# Patient Record
Sex: Male | Born: 1985
Health system: Southern US, Community
[De-identification: ages and names within clinical notes are randomized; demographics above are authoritative.]

## PROBLEM LIST (undated history)

## (undated) DIAGNOSIS — G540 Brachial plexus disorders: Secondary | ICD-10-CM

## (undated) DIAGNOSIS — E785 Hyperlipidemia, unspecified: Secondary | ICD-10-CM

## (undated) DIAGNOSIS — F329 Major depressive disorder, single episode, unspecified: Secondary | ICD-10-CM

## (undated) DIAGNOSIS — J3089 Other allergic rhinitis: Secondary | ICD-10-CM

## (undated) DIAGNOSIS — F32A Depression, unspecified: Secondary | ICD-10-CM

## (undated) DIAGNOSIS — I10 Essential (primary) hypertension: Secondary | ICD-10-CM

## (undated) DIAGNOSIS — H8109 Meniere's disease, unspecified ear: Secondary | ICD-10-CM

## (undated) DIAGNOSIS — J45909 Unspecified asthma, uncomplicated: Secondary | ICD-10-CM

## (undated) DIAGNOSIS — B019 Varicella without complication: Secondary | ICD-10-CM

## (undated) HISTORY — PX: OTHER SURGICAL HISTORY: SHX169

## (undated) HISTORY — DX: Varicella without complication: B01.9

## (undated) HISTORY — DX: Major depressive disorder, single episode, unspecified: F32.9

## (undated) HISTORY — DX: Other allergic rhinitis: J30.89

## (undated) HISTORY — DX: Brachial plexus disorders: G54.0

## (undated) HISTORY — DX: Essential (primary) hypertension: I10

## (undated) HISTORY — DX: Depression, unspecified: F32.A

## (undated) HISTORY — DX: Hyperlipidemia, unspecified: E78.5

---

## 2011-02-26 ENCOUNTER — Encounter (INDEPENDENT_AMBULATORY_CARE_PROVIDER_SITE_OTHER): Payer: Self-pay | Admitting: General Surgery

## 2011-02-26 NOTE — Telephone Encounter (Signed)
This encounter was created in error - please disregard.

## 2012-05-12 ENCOUNTER — Emergency Department (HOSPITAL_COMMUNITY)
Admission: EM | Admit: 2012-05-12 | Discharge: 2012-05-12 | Disposition: A | Discharge status: Home / Self Care | Payer: 59 | Attending: Emergency Medicine | Admitting: Emergency Medicine

## 2012-05-12 ENCOUNTER — Other Ambulatory Visit: Payer: Self-pay | Admitting: *Deleted

## 2012-05-12 ENCOUNTER — Encounter (HOSPITAL_COMMUNITY): Payer: Self-pay | Admitting: *Deleted

## 2012-05-12 DIAGNOSIS — R002 Palpitations: Secondary | ICD-10-CM

## 2012-05-12 DIAGNOSIS — Z79899 Other long term (current) drug therapy: Secondary | ICD-10-CM | POA: Insufficient documentation

## 2012-05-12 DIAGNOSIS — J45909 Unspecified asthma, uncomplicated: Secondary | ICD-10-CM | POA: Insufficient documentation

## 2012-05-12 DIAGNOSIS — R0789 Other chest pain: Secondary | ICD-10-CM | POA: Insufficient documentation

## 2012-05-12 HISTORY — DX: Unspecified asthma, uncomplicated: J45.909

## 2012-05-12 LAB — POCT I-STAT, CHEM 8
Calcium, Ion: 1.23 mmol/L (ref 1.12–1.23)
Creatinine, Ser: 1 mg/dL (ref 0.50–1.35)
Glucose, Bld: 107 mg/dL — ABNORMAL HIGH (ref 70–99)
Hemoglobin: 15.3 g/dL (ref 13.0–17.0)
TCO2: 29 mmol/L (ref 0–100)

## 2012-05-12 LAB — POCT I-STAT TROPONIN I: Troponin i, poc: 0.02 ng/mL (ref 0.00–0.08)

## 2012-05-12 NOTE — ED Provider Notes (Addendum)
History     CSN: [id number]  Arrival date & time 05/12/12  4098   First MD Initiated Contact with Patient 05/12/12 1000      Chief Complaint  Patient presents with  . Palpitations    (Consider location/radiation/quality/duration/timing/severity/associated sxs/prior treatment) HPI Presents with palpitations and feeling of skipped heart beat onset 4:30 AM today. Symptoms accompanied by vague left anterior chest discomfort. Lasted 1.5 hours, resolve spontaneously without treatment No shortness of breath no nausea no sweatiness. He had a similar episode 1.5 years ago when using Provigil. He has since stopped Provigil. He also gets similar episodes lasting few seconds when he uses his albuterol inhaler. He is presently asymptomatic without treatment. I shortness of breath nausea or sweatiness Past Medical History  Diagnosis Date  . Asthma     History reviewed. No pertinent past surgical history.  No family history on file.  History  Substance Use Topics  . Smoking status: Never Smoker   . Smokeless tobacco: Not on file  . Alcohol Use: Yes     Comment: social drinker   No drug use   Review of Systems  Constitutional: Negative.   HENT: Negative.   Respiratory: Negative.   Cardiovascular: Positive for chest pain and palpitations.  Gastrointestinal: Negative.   Musculoskeletal: Negative.   Skin: Negative.   Neurological: Negative.   Psychiatric/Behavioral: Negative.   All other systems reviewed and are negative.    Allergies  Review of patient's allergies indicates no known allergies.  Home Medications   Current Outpatient Rx  Name  Route  Sig  Dispense  Refill  . albuterol (PROVENTIL HFA;VENTOLIN HFA) 108 (90 BASE) MCG/ACT inhaler   Inhalation   Inhale 2 puffs into the lungs every 6 (six) hours as needed for wheezing.         . diazepam (VALIUM) 5 MG tablet   Oral   Take 5 mg by mouth every 6 (six) hours as needed (menieres disease).         Marland Kitchen  diphenhydrAMINE (BENADRYL) 25 MG tablet   Oral   Take 25 mg by mouth every 6 (six) hours as needed for itching or allergies.         Marland Kitchen ondansetron (ZOFRAN-ODT) 8 MG disintegrating tablet   Oral   Take 8 mg by mouth every 8 (eight) hours as needed for nausea.           BP 133/92  Pulse 63  Temp(Src) 97.8 F (36.6 C) (Oral)  Resp 11  SpO2 98%  Physical Exam  Nursing note and vitals reviewed. Constitutional: He appears well-developed and well-nourished.  HENT:  Head: Normocephalic and atraumatic.  Eyes: Conjunctivae are normal. Pupils are equal, round, and reactive to light.  Neck: Neck supple. No tracheal deviation present. No thyromegaly present.  Cardiovascular: Normal rate and regular rhythm.   No murmur heard. Pulmonary/Chest: Effort normal and breath sounds normal.  Abdominal: Soft. Bowel sounds are normal. He exhibits no distension. There is no tenderness.  Musculoskeletal: Normal range of motion. He exhibits no edema and no tenderness.  Neurological: He is alert. Coordination normal.  Skin: Skin is warm and dry. No rash noted.  Psychiatric: He has a normal mood and affect.    ED Course  Procedures (including critical care time)  Labs Reviewed - No data to display No results found.   No diagnosis found.   Date: 05/12/2012  Rate: 90  Rhythm: normal sinus rhythm  QRS Axis: left  Intervals: normal  ST/T Wave abnormalities:  nonspecific T wave changes  Conduction Disutrbances:left anterior fascicular block and nonspecific intraventricular conduction delay  Narrative Interpretation:   Old EKG Reviewed: none available  1:40 PM patient remains asymptomatic. No abnormality a cardiac monitor while here MDM    Results for orders placed during the hospital encounter of 05/12/12  POCT I-STAT, CHEM 8      Result Value Range   Sodium 140  135 - 145 mEq/L   Potassium 4.0  3.5 - 5.1 mEq/L   Chloride 103  96 - 112 mEq/L   BUN 13  6 - 23 mg/dL   Creatinine, Ser  1.19  0.50 - 1.35 mg/dL   Glucose, Bld 147 (*) 70 - 99 mg/dL   Calcium, Ion 8.29  5.62 - 1.23 mmol/L   TCO2 29  0 - 100 mmol/L   Hemoglobin 15.3  13.0 - 17.0 g/dL   HCT 13.0  86.5 - 78.4 %  POCT I-STAT TROPONIN I      Result Value Range   Troponin i, poc 0.02  0.00 - 0.08 ng/mL   Comment 3           POCT I-STAT TROPONIN I      Result Value Range   Troponin i, poc 0.00  0.00 - 0.08 ng/mL   Comment 3            No results found.   Spoke with Dr. Daleen Squibb If ED w/u neg , will arrange for oputpt echo and event monitor Diagnosis #1 palpitations #2 atypical chest pain  Doug Sou, MD 05/12/12 1349  Doug Sou, MD 05/29/12 1450

## 2012-05-12 NOTE — ED Notes (Addendum)
Pt c/o asthma exacerbation X 5 days. Pt states for the past 36 hrs he "feels runs of PVC" experiences sob with episodes. These episodes have worsened the last 12 hours. C/o of dull midsternal and left sided chest pain X 2 hrs. Denies n/v/d.

## 2012-05-12 NOTE — ED Notes (Signed)
Family at bedside. 

## 2012-05-15 ENCOUNTER — Emergency Department (HOSPITAL_COMMUNITY)
Admission: EM | Admit: 2012-05-15 | Discharge: 2012-05-15 | Disposition: A | Discharge status: Home / Self Care | Payer: 59 | Attending: Emergency Medicine | Admitting: Emergency Medicine

## 2012-05-15 ENCOUNTER — Other Ambulatory Visit (HOSPITAL_COMMUNITY): Payer: 59

## 2012-05-15 ENCOUNTER — Emergency Department (HOSPITAL_COMMUNITY): Payer: 59

## 2012-05-15 ENCOUNTER — Encounter (HOSPITAL_COMMUNITY): Payer: Self-pay | Admitting: Radiology

## 2012-05-15 ENCOUNTER — Telehealth: Payer: Self-pay | Admitting: Pulmonary Disease

## 2012-05-15 DIAGNOSIS — R0602 Shortness of breath: Secondary | ICD-10-CM | POA: Insufficient documentation

## 2012-05-15 DIAGNOSIS — R079 Chest pain, unspecified: Secondary | ICD-10-CM | POA: Insufficient documentation

## 2012-05-15 DIAGNOSIS — R002 Palpitations: Secondary | ICD-10-CM | POA: Insufficient documentation

## 2012-05-15 DIAGNOSIS — J45909 Unspecified asthma, uncomplicated: Secondary | ICD-10-CM | POA: Insufficient documentation

## 2012-05-15 DIAGNOSIS — Z79899 Other long term (current) drug therapy: Secondary | ICD-10-CM | POA: Insufficient documentation

## 2012-05-15 LAB — CBC WITH DIFFERENTIAL/PLATELET
Basophils Absolute: 0.1 10*3/uL (ref 0.0–0.1)
Eosinophils Relative: 6 % — ABNORMAL HIGH (ref 0–5)
Lymphocytes Relative: 33 % (ref 12–46)
MCV: 78.3 fL (ref 78.0–100.0)
Neutro Abs: 3.3 10*3/uL (ref 1.7–7.7)
Platelets: 196 10*3/uL (ref 150–400)
RDW: 12.8 % (ref 11.5–15.5)
WBC: 6.4 10*3/uL (ref 4.0–10.5)

## 2012-05-15 LAB — POCT I-STAT TROPONIN I

## 2012-05-15 LAB — COMPREHENSIVE METABOLIC PANEL
ALT: 36 U/L (ref 0–53)
AST: 23 U/L (ref 0–37)
Albumin: 4.6 g/dL (ref 3.5–5.2)
CO2: 29 mEq/L (ref 19–32)
Calcium: 10.1 mg/dL (ref 8.4–10.5)
GFR calc non Af Amer: >90 mL/min (ref >90)
Sodium: 141 mEq/L (ref 135–145)
Total Protein: 7.9 g/dL (ref 6.0–8.3)

## 2012-05-15 MED ORDER — PREDNISONE 20 MG PO TABS: 40.0000 mg | ORAL_TABLET | Freq: Every day | ORAL | Status: DC

## 2012-05-15 MED ORDER — IOHEXOL 350 MG/ML SOLN
100.0000 mL | Freq: Once | INTRAVENOUS | Status: AC | PRN
  Administered 2012-05-15: 100 mL via INTRAVENOUS

## 2012-05-15 MED ORDER — PREDNISONE 20 MG PO TABS
60.0000 mg | ORAL_TABLET | Freq: Once | ORAL | Status: AC
  Administered 2012-05-15: 60 mg via ORAL
  Filled 2012-05-15: qty 3

## 2012-05-15 NOTE — ED Provider Notes (Addendum)
History     CSN: [id number]  Arrival date & time 05/15/12  1617   First MD Initiated Contact with Patient 05/15/12 1629      Chief Complaint  Patient presents with  . Chest Pain  . Shortness of Breath    (Consider location/radiation/quality/duration/timing/severity/associated sxs/prior treatment) HPI Comments: Was evaluated by the ED on tues with normal EKG and neg troponin however today became much worse with worsening SOB and came here for further eval.  Patient is a 27 y.o. male presenting with chest pain and shortness of breath. The history is provided by the patient.  Chest Pain Pain location:  L chest Pain quality: aching, pressure and tightness   Pain radiates to:  L jaw Pain radiates to the back: no   Pain severity:  Moderate Onset quality:  Gradual Duration:  5 days Timing:  Constant Progression:  Worsening Chronicity:  New Context comment:  Started gradually not related to anything on monday night. Relieved by:  Nothing Worsened by:  Exertion, movement and certain positions (worse with lying on the left side and exertion) Associated symptoms: palpitations and shortness of breath   Associated symptoms: no abdominal pain, no back pain, no cough, no diaphoresis, no fever, no heartburn, no nausea and not vomiting   Risk factors: high cholesterol and male sex   Risk factors: no coronary artery disease, no diabetes mellitus, no hypertension, no prior DVT/PE, no smoking and no surgery   Risk factors comment:  Hx of asthma Shortness of Breath Associated symptoms: chest pain   Associated symptoms: no abdominal pain, no cough, no diaphoresis, no fever and no vomiting     Past Medical History  Diagnosis Date  . Asthma     No past surgical history on file.  No family history on file.  History  Substance Use Topics  . Smoking status: Never Smoker   . Smokeless tobacco: Not on file  . Alcohol Use: Yes     Comment: social drinker      Review of Systems   Constitutional: Negative for fever and diaphoresis.  Respiratory: Positive for shortness of breath. Negative for cough.   Cardiovascular: Positive for chest pain and palpitations.  Gastrointestinal: Negative for heartburn, nausea, vomiting and abdominal pain.  Musculoskeletal: Negative for back pain.  All other systems reviewed and are negative.    Allergies  Review of patient's allergies indicates no known allergies.  Home Medications   Current Outpatient Rx  Name  Route  Sig  Dispense  Refill  . albuterol (PROVENTIL HFA;VENTOLIN HFA) 108 (90 BASE) MCG/ACT inhaler   Inhalation   Inhale 2 puffs into the lungs every 6 (six) hours as needed for wheezing.         . diazepam (VALIUM) 5 MG tablet   Oral   Take 5 mg by mouth every 6 (six) hours as needed (menieres disease).         Marland Kitchen diphenhydrAMINE (BENADRYL) 25 MG tablet   Oral   Take 25 mg by mouth every 6 (six) hours as needed for itching or allergies.         Marland Kitchen ondansetron (ZOFRAN-ODT) 8 MG disintegrating tablet   Oral   Take 8 mg by mouth every 8 (eight) hours as needed for nausea.           There were no vitals taken for this visit.  Physical Exam  Nursing note and vitals reviewed. Constitutional: He is oriented to person, place, and time. He appears well-developed and well-nourished.  He appears distressed.  HENT:  Head: Normocephalic and atraumatic.  Mouth/Throat: Oropharynx is clear and moist.  Eyes: Conjunctivae and EOM are normal. Pupils are equal, round, and reactive to light.  Neck: Normal range of motion. Neck supple.  Cardiovascular: Regular rhythm and intact distal pulses.  Tachycardia present.   No murmur heard. Pulmonary/Chest: Effort normal and breath sounds normal. No respiratory distress. He has no wheezes. He has no rales.  Abdominal: Soft. He exhibits no distension. There is no tenderness. There is no rebound and no guarding.  Musculoskeletal: Normal range of motion. He exhibits no edema and  no tenderness.  Neurological: He is alert and oriented to person, place, and time.  Skin: Skin is warm and dry. No rash noted. No erythema.  Clammy to the touch but no overt diaphorsis  Psychiatric: He has a normal mood and affect. His behavior is normal.    ED Course  Procedures (including critical care time)  Labs Reviewed  CBC WITH DIFFERENTIAL - Abnormal; Notable for the following:    Eosinophils Relative 6 (*)    All other components within normal limits  COMPREHENSIVE METABOLIC PANEL - Abnormal; Notable for the following:    Glucose, Bld 105 (*)    All other components within normal limits  D-DIMER, QUANTITATIVE  POCT I-STAT TROPONIN I   Dg Chest 2 View  05/15/2012  *RADIOLOGY REPORT*  Clinical Data: 27 year old male shortness of breath and chest pain.  CHEST - 2 VIEW  Comparison: None.  Findings: Multiple EKG leads and wires overlie the chest.  Normal lung volumes. Normal cardiac size and mediastinal contours. Visualized tracheal air column is within normal limits.  No pneumothorax, pulmonary edema, pleural effusion or confluent pulmonary opacity.  No osseous abnormality identified.  IMPRESSION: Negative, no acute cardiopulmonary abnormality.   Original Report Authenticated By: Erskine Speed, M.D.    Ct Angio Chest Pe W/cm &/or Wo Cm  05/15/2012  *RADIOLOGY REPORT*  Clinical Data: Chest pain, short of breath, tachycardia  CT ANGIOGRAPHY CHEST  Technique:  Multidetector CT imaging of the chest using the standard protocol during bolus administration of intravenous contrast. Multiplanar reconstructed images including MIPs were obtained and reviewed to evaluate the vascular anatomy.  Contrast: OMNIPAQUE IOHEXOL 350 MG/ML SOLN  Comparison: Prior chest x-ray earlier today 05/15/2012 at 1703 hours  Findings:  Mediastinum: Unremarkable CT appearance of the thyroid gland.  No suspicious mediastinal or hilar adenopathy.  No soft tissue mediastinal mass.  The thoracic esophagus is unremarkable.   Heart/Vascular: Adequate opacification of the pulmonary arteries to the proximal segmental level.  More distal evaluation is limited secondary to a combination of respiratory motion and contrast bolus timing.  No central filling defect to suggest acute pulmonary embolus. There is a bovine configuration of the aortic arch (two vessel arch with common origin of the brachiocephalic and left common carotid arteries), a normal anatomic variant.  No aortic dilatation.  The heart is within normal limits for size.  No pericardial effusion. Collateral veins in the left shoulder and chest suggests at least some compression of the left subclavian vein, perhaps secondary to subclinical thoracic outlet compression.  Lungs/Pleura:  Respiratory motion slightly limits evaluation for Milich pulmonary nodules.  The lungs are clear.  Of note, the most inferior aspect of the lower lobes is incompletely imaged.  Upper Abdomen: Visualized upper abdomen is unremarkable.  Bones: No acute fracture or aggressive appearing lytic or blastic osseous lesion. Dextroconvex scoliosis of the upper thoracic spine may be partly  positional in nature.  IMPRESSION:  1.  Negative for pulmonary embolism to the proximal segmental level, pneumonia or other acute cardiopulmonary process.  2. Collateral veins in the left upper arm and chest suggests underlying subclinical thoracic outlet compression of the subclavian vein.   Original Report Authenticated By: Malachy Moan, M.D.      Date: 05/15/2012  Rate: 93  Rhythm: normal sinus rhythm  QRS Axis: normal  Intervals: normal  ST/T Wave abnormalities: nonspecific ST/T changes  Conduction Disutrbances:Incomplete right bundle-branch block  Narrative Interpretation:   Old EKG Reviewed: unchanged    1. Chest pain   2. Shortness of breath       MDM   The patient now with 5 days of chest tightness, shortness of breath and palpitations. He was seen on Tuesday and had a normal EKG and troponins  and was discharged home however he states since that time symptoms have worsened. Is worse with exertion and lying on the left side. He does have a history of asthma but no other medical problems. He does not smoke and he has only used his inhaler 3 times daily. Low suspicion for albuterol adverse effects from overuse. Patient denies any fever, cough, abdominal pain or vomiting. However he does appear uncomfortable and is tachycardic in the 110-120 range. He's been persistently hypertensive which is new. The patient.  Concern for possible PE versus pericarditis versus pericardial effusion in. Low suspicion for cardiac disease as this patient is 27 years old without risk factors except for hyperlipidemia and would be very unlikely.  Patient denies a history of anxiety or panic attacks. He denies using any stimulants other than albuterol.  CBC, CMP, troponin, d-dimer, chest x-ray, EKG pending  7:31 PM Labs CT of the chest and EKG are all within normal limits. Feel that this must be exacerbation of his underlying asthma which he was in the PICU for when he was a child. Patient has an appointment to see pulmonology at the end of May. We'll try a course of prednisone to see if that improves his symptoms.      Gwyneth Sprout, MD 05/15/12 1932  Gwyneth Sprout, MD 05/15/12 1610

## 2012-05-15 NOTE — ED Notes (Signed)
Pt seen here Tuesday for same. Reports ongoing left sided CP that radiates to left neck. Pain increased when ambulating. Increased SOB.

## 2012-05-15 NOTE — Telephone Encounter (Signed)
lmomtcb x1 for pt 

## 2012-05-18 NOTE — Telephone Encounter (Addendum)
I have LMTCBx2 with the pt. Dr. Shelle Iron did you speak to Brett Canales Minor about this patient? Can we schedule him with you for a new asthma consult? He has been seen in the ED twice over last week but I do not see where they recommended pulmonary appt so this would technically be a self-referrral? Please advise. Carron Curie, CMA  If appt is ok I have held slot on wed at 9:30. Remove hold if not ok to schedule appt with KC. Carron Curie, CMA

## 2012-05-18 NOTE — Telephone Encounter (Signed)
lmtcb x3 for pt--looks like he went to ED today If appt is ok I have held slot on wed at 9:30

## 2012-05-18 NOTE — Telephone Encounter (Signed)
Ok for him to see me.  I did agree to see him as a self referral.  Lets see if we can get him in fairly soon.   Thanks.

## 2012-05-19 NOTE — Telephone Encounter (Signed)
Spoke with patient, he states he is having some diff with his phone and will call us back. Patient needs to be seen by Dr. Shelle Iron for self referral consult. [facility] has held a slot on Wed for patient at 930am

## 2012-05-20 NOTE — Telephone Encounter (Signed)
LMTCB

## 2012-05-21 NOTE — Telephone Encounter (Signed)
The earliest openings on [facility]'s schedule for a consult are June 4 Dr Shelle Iron is this early enough to offer the patient, or would you like him worked in sooner?  If so, please advise of a work-in date/time. Thanks.

## 2012-05-21 NOTE — Telephone Encounter (Signed)
LMTCBx2. Nino Amano, CMA  

## 2012-05-21 NOTE — Telephone Encounter (Signed)
I would like to see him earlier With all the cancellations I seem to be having, should be able to find a spot. See ashtyn.

## 2012-05-22 NOTE — Telephone Encounter (Signed)
I LMTCBx3 and advised if the pt still needs an appt to please call office. Per protocol I will close message. Carron Curie, CMA

## 2012-06-01 ENCOUNTER — Encounter: Payer: Self-pay | Admitting: *Deleted

## 2012-06-01 ENCOUNTER — Encounter: Payer: Self-pay | Admitting: Cardiology

## 2012-06-01 DIAGNOSIS — J45909 Unspecified asthma, uncomplicated: Secondary | ICD-10-CM | POA: Insufficient documentation

## 2012-06-01 DIAGNOSIS — R0602 Shortness of breath: Secondary | ICD-10-CM | POA: Insufficient documentation

## 2012-06-01 DIAGNOSIS — R079 Chest pain, unspecified: Secondary | ICD-10-CM | POA: Insufficient documentation

## 2012-06-02 ENCOUNTER — Encounter: Payer: 59 | Admitting: Cardiology

## 2012-06-02 ENCOUNTER — Encounter: Payer: Self-pay | Admitting: Cardiology

## 2012-06-02 NOTE — Progress Notes (Signed)
  HPI: 27 year-old male for evaluation of chest pain and dyspnea. Seen recently in the emergency room twice for these symptoms. Chest x-ray on May 2 negative. Chest CT may the second showed no pulmonary embolus. There were collateral veins in the left upper arm and chest suggestive of thoracic outlet compression of the subclavian vein. Cardiac enzymes negative. Hemoglobin 15.3. Renal function and liver functions normal. D-dimer negative.  Current Outpatient Prescriptions  Medication Sig Dispense Refill  . albuterol (PROVENTIL HFA;VENTOLIN HFA) 108 (90 BASE) MCG/ACT inhaler Inhale 2 puffs into the lungs every 6 (six) hours as needed for wheezing.      . diphenhydrAMINE (BENADRYL) 25 MG tablet Take 25 mg by mouth every 6 (six) hours as needed for itching or allergies.      . predniSONE (DELTASONE) 20 MG tablet Take 2 tablets (40 mg total) by mouth daily.  10 tablet  0   No current facility-administered medications for this visit.    No Known Allergies  Past Medical History  Diagnosis Date  . Asthma     No past surgical history on file.  History   Social History  . Marital Status: Single    Spouse Name: N/A    Number of Children: N/A  . Years of Education: N/A   Occupational History  . Not on file.   Social History Main Topics  . Smoking status: Never Smoker   . Smokeless tobacco: Not on file  . Alcohol Use: Yes     Comment: social drinker  . Drug Use: No  . Sexually Active: Not on file   Other Topics Concern  . Not on file   Social History Narrative  . No narrative on file    No family history on file.  ROS: no fevers or chills, productive cough, hemoptysis, dysphasia, odynophagia, melena, hematochezia, dysuria, hematuria, rash, seizure activity, orthopnea, PND, pedal edema, claudication. Remaining systems are negative.  Physical Exam:   There were no vitals taken for this visit.  General:  Well developed/well nourished in NAD Skin warm/dry Patient not  depressed No peripheral clubbing Back-normal HEENT-normal/normal eyelids Neck supple/normal carotid upstroke bilaterally; no bruits; no JVD; no thyromegaly chest - CTA/ normal expansion CV - RRR/normal S1 and S2; no murmurs, rubs or gallops;  PMI nondisplaced Abdomen -NT/ND, no HSM, no mass, + bowel sounds, no bruit 2+ femoral pulses, no bruits Ext-no edema, chords, 2+ DP Neuro-grossly nonfocal  ECG 05/17/2012-sinus rhythm, RV conduction delay, left ventricular hypertrophy, poor R wave progression.   This encounter was created in error - please disregard.

## 2013-02-17 ENCOUNTER — Emergency Department (HOSPITAL_COMMUNITY)
Admission: EM | Admit: 2013-02-17 | Discharge: 2013-02-18 | Disposition: A | Discharge status: Home / Self Care | Payer: 59 | Attending: Emergency Medicine | Admitting: Emergency Medicine

## 2013-02-17 ENCOUNTER — Emergency Department (HOSPITAL_COMMUNITY): Payer: 59

## 2013-02-17 ENCOUNTER — Encounter (HOSPITAL_COMMUNITY): Payer: Self-pay | Admitting: Emergency Medicine

## 2013-02-17 DIAGNOSIS — R519 Headache, unspecified: Secondary | ICD-10-CM

## 2013-02-17 DIAGNOSIS — Z79899 Other long term (current) drug therapy: Secondary | ICD-10-CM | POA: Insufficient documentation

## 2013-02-17 DIAGNOSIS — Z8669 Personal history of other diseases of the nervous system and sense organs: Secondary | ICD-10-CM | POA: Insufficient documentation

## 2013-02-17 DIAGNOSIS — J45909 Unspecified asthma, uncomplicated: Secondary | ICD-10-CM | POA: Insufficient documentation

## 2013-02-17 DIAGNOSIS — R51 Headache: Secondary | ICD-10-CM | POA: Insufficient documentation

## 2013-02-17 HISTORY — DX: Meniere's disease, unspecified ear: H81.09

## 2013-02-17 LAB — BASIC METABOLIC PANEL
BUN: 16 mg/dL (ref 6–23)
CALCIUM: 9.6 mg/dL (ref 8.4–10.5)
CO2: 26 mEq/L (ref 19–32)
Chloride: 103 mEq/L (ref 96–112)
Creatinine, Ser: 1.18 mg/dL (ref 0.50–1.35)
GFR calc Af Amer: >90 mL/min (ref >90)
GFR, EST NON AFRICAN AMERICAN: 83 mL/min — AB (ref >90)
GLUCOSE: 94 mg/dL (ref 70–99)
Potassium: 3.9 mEq/L (ref 3.7–5.3)
SODIUM: 143 meq/L (ref 137–147)

## 2013-02-17 LAB — CBC
HCT: 42.3 % (ref 39.0–52.0)
Hemoglobin: 15.1 g/dL (ref 13.0–17.0)
MCH: 29.3 pg (ref 26.0–34.0)
MCHC: 35.7 g/dL (ref 30.0–36.0)
MCV: 82 fL (ref 78.0–100.0)
PLATELETS: 193 10*3/uL (ref 150–400)
RBC: 5.16 MIL/uL (ref 4.22–5.81)
RDW: 12.8 % (ref 11.5–15.5)
WBC: 5.7 10*3/uL (ref 4.0–10.5)

## 2013-02-17 LAB — GLUCOSE, CAPILLARY: Glucose-Capillary: 111 mg/dL — ABNORMAL HIGH (ref 70–99)

## 2013-02-17 NOTE — ED Notes (Signed)
Pt c/o headache, pt says this started around 2130 this evening, pt is clutching left side of head, pt stated that he works for [employer] and has never experienced pain like this before

## 2013-02-17 NOTE — ED Notes (Signed)
AKenton Kingfisher gave v.o for labs and CT scan; wants pt back quickly but does not want code stroke called

## 2013-02-17 NOTE — ED Notes (Addendum)
Pt reports sudden onset of headache 40 minutes ago at 2130; pt grips equal, no drift, speech clear, pt does report different sensation to L face with touch; face symmetrical; pain intermittent but severe when comes; pt has steady gait and a&Ox4; AKenton Kingfisher PA notified and in room evaluated pt

## 2013-02-18 NOTE — ED Provider Notes (Addendum)
CSN: [id number]     Arrival date & time 02/17/13  2127 History   First MD Initiated Contact with Patient 02/17/13 2356     Chief Complaint  Patient presents with  . Headache   (Consider location/radiation/quality/duration/timing/severity/associated sxs/prior Treatment) Patient is a 28 y.o. male presenting with headaches. The history is provided by the patient.  Headache Associated symptoms: no abdominal pain, no back pain, no pain, no fever, no neck pain, no neck stiffness, no numbness, no sinus pressure and no vomiting   pt c/o onset headache earlier tonight while sitting in chair, drinking wine, watching tv. Was left frontal. Acute onset, dull, mod-severe, lasted approximately 15-20 seconds. Resolved. Recurred x 1, also lasting several seconds. Currently no headache. Denies hx frequent headaches or migraines, states has a sibling w migraines, no other fam hx headaches/migraines/aneurysm. No nv. No photophobia or phonophobia. No eye pain or change in vision. No numbness/weakness. No problems w balance or coordination. No syncope/fainting. No neck pain or stiffness. No sinus pressure or uri c/o. No fever or chills. Pt notes hx Menieres dis/tinnitus - no acute tinnitus, vertigo, or hearing change w tonights earlier symptoms.     Past Medical History  Diagnosis Date  . Asthma   . Meniere's disease    History reviewed. No pertinent past surgical history. History reviewed. No pertinent family history. History  Substance Use Topics  . Smoking status: Never Smoker   . Smokeless tobacco: Not on file  . Alcohol Use: Yes     Comment: social drinker    Review of Systems  Constitutional: Negative for fever.  HENT: Negative for sinus pressure.   Eyes: Negative for pain, redness and visual disturbance.  Respiratory: Negative for shortness of breath.   Cardiovascular: Negative for chest pain.  Gastrointestinal: Negative for vomiting and abdominal pain.  Genitourinary: Negative for flank pain.   Musculoskeletal: Negative for back pain, neck pain and neck stiffness.  Skin: Negative for rash.  Neurological: Positive for headaches. Negative for syncope, weakness and numbness.  Hematological: Does not bruise/bleed easily.  Psychiatric/Behavioral: Negative for confusion.    Allergies  Review of patient's allergies indicates no known allergies.  Home Medications   Current Outpatient Rx  Name  Route  Sig  Dispense  Refill  . albuterol (PROVENTIL HFA;VENTOLIN HFA) 108 (90 BASE) MCG/ACT inhaler   Inhalation   Inhale 2 puffs into the lungs every 6 (six) hours as needed for wheezing.         . diphenhydrAMINE (BENADRYL) 25 MG tablet   Oral   Take 25 mg by mouth every 6 (six) hours as needed for itching or allergies.          BP 139/97  Pulse 102  Temp(Src) 98.1 F (36.7 C) (Oral)  Resp 20  SpO2 98% Physical Exam  Nursing note and vitals reviewed. Constitutional: He is oriented to person, place, and time. He appears well-developed and well-nourished. No distress.  HENT:  Head: Atraumatic.  Nose: Nose normal.  Mouth/Throat: Oropharynx is clear and moist.  No sinus or temporal tenderness.  tms wnl  Eyes: Conjunctivae are normal. Pupils are equal, round, and reactive to light.  Neck: Normal range of motion. Neck supple. No tracheal deviation present.  No stiffness or rigidity  Cardiovascular: Normal rate, regular rhythm, normal heart sounds and intact distal pulses.   Pulmonary/Chest: Effort normal and breath sounds normal. No accessory muscle usage. No respiratory distress.  Abdominal: Soft. Bowel sounds are normal. He exhibits no distension. There is no  tenderness.  Musculoskeletal: Normal range of motion. He exhibits no edema and no tenderness.  Neurological: He is alert and oriented to person, place, and time. No cranial nerve deficit. Coordination normal.  Motor intact bil, stre 5/5. No pronator drift. Steady gait. Coordination, finger to nose, normal. Romberg  testing normal.   Skin: Skin is warm and dry. No rash noted. He is not diaphoretic.  Psychiatric: He has a normal mood and affect.    ED Course  Procedures (including critical care time) Labs Review Labs Reviewed  BASIC METABOLIC PANEL - Abnormal; Notable for the following:    GFR calc non Af Amer 83 (*)    All other components within normal limits  GLUCOSE, CAPILLARY - Abnormal; Notable for the following:    Glucose-Capillary 111 (*)    All other components within normal limits  CBC   Imaging Review Ct Head (brain) Wo Contrast  02/17/2013   CLINICAL DATA:  Headache  EXAM: CT HEAD WITHOUT CONTRAST  TECHNIQUE: Contiguous axial images were obtained from the base of the skull through the vertex without intravenous contrast.  COMPARISON:  None available  FINDINGS: There is no acute intracranial hemorrhage or infarct. No mass lesion or midline shift. Gray-white matter differentiation is well maintained. Ventricles are normal in size without evidence of hydrocephalus. CSF containing spaces are within normal limits. No extra-axial fluid collection.  The calvarium is intact.  Orbital soft tissues are within normal limits.  Minimal polypoid opacity noted within the partially visualized 4 of the right maxillary sinus. Paranasal sinuses are otherwise clear. No mastoid effusion.  Scalp soft tissues are unremarkable.  IMPRESSION: No acute intracranial abnormality.   Electronically Signed   By: Jeannine Boga M.D.   On: 02/17/2013 23:23    EKG Interpretation   None       MDM  Pt states headache resolved. States lasted approximately 20 seconds.  Pt declines any pain medication in ED, states feels fine, ready for d/c.  Pt remains asymptomatic, ct neg acute. Hr 84 bp 142/92, afeb.  Pt appears stable for d/c.       Mirna Mires, MD 02/18/13 1610  Mirna Mires, MD 02/18/13 [phone]

## 2013-02-18 NOTE — Discharge Instructions (Signed)
You head ct scan was read by our radiologist as showing no acute process. Take tylenol/motrin as need. Follow up with primary care doctor in coming week.  Your blood pressure was elevated tonight - have it rechecked by primary care doctor at follow up. Follow up with neurologist if recurrent or frequent headaches.  Return to ER if worse, symptoms recur, severe headache, vomiting, numbness/weakness, fevers, other concern.        Headaches, Frequently Asked Questions MIGRAINE HEADACHES Q: What is migraine? What causes it? How can I treat it? A: Generally, migraine headaches begin as a dull ache. Then they develop into a constant, throbbing, and pulsating pain. You may experience pain at the temples. You may experience pain at the front or back of one or both sides of the head. The pain is usually accompanied by a combination of:  Nausea.  Vomiting.  Sensitivity to light and noise. Some people (about 15%) experience an aura (see below) before an attack. The cause of migraine is believed to be chemical reactions in the brain. Treatment for migraine may include over-the-counter or prescription medications. It may also include self-help techniques. These include relaxation training and biofeedback.  Q: What is an aura? A: About 15% of people with migraine get an "aura". This is a sign of neurological symptoms that occur before a migraine headache. You may see wavy or jagged lines, dots, or flashing lights. You might experience tunnel vision or blind spots in one or both eyes. The aura can include visual or auditory hallucinations (something imagined). It may include disruptions in smell (such as strange odors), taste or touch. Other symptoms include:  Numbness.  A "pins and needles" sensation.  Difficulty in recalling or speaking the correct word. These neurological events may last as long as 60 minutes. These symptoms will fade as the headache begins. Q: What is a trigger? A: Certain  physical or environmental factors can lead to or "trigger" a migraine. These include:  Foods.  Hormonal changes.  Weather.  Stress. It is important to remember that triggers are different for everyone. To help prevent migraine attacks, you need to figure out which triggers affect you. Keep a headache diary. This is a good way to track triggers. The diary will help you talk to your healthcare professional about your condition. Q: Does weather affect migraines? A: Bright sunshine, hot, humid conditions, and drastic changes in barometric pressure may lead to, or "trigger," a migraine attack in some people. But studies have shown that weather does not act as a trigger for everyone with migraines. Q: What is the link between migraine and hormones? A: Hormones start and regulate many of your body's functions. Hormones keep your body in balance within a constantly changing environment. The levels of hormones in your body are unbalanced at times. Examples are during menstruation, pregnancy, or menopause. That can lead to a migraine attack. In fact, about three quarters of all women with migraine report that their attacks are related to the menstrual cycle.  Q: Is there an increased risk of stroke for migraine sufferers? A: The likelihood of a migraine attack causing a stroke is very remote. That is not to say that migraine sufferers cannot have a stroke associated with their migraines. In persons under age 60, the most common associated factor for stroke is migraine headache. But over the course of a person's normal life span, the occurrence of migraine headache may actually be associated with a reduced risk of dying from cerebrovascular disease due  to stroke.  Q: What are acute medications for migraine? A: Acute medications are used to treat the pain of the headache after it has started. Examples over-the-counter medications, NSAIDs, ergots, and triptans.  Q: What are the triptans? A: Triptans are the  newest class of abortive medications. They are specifically targeted to treat migraine. Triptans are vasoconstrictors. They moderate some chemical reactions in the brain. The triptans work on receptors in your brain. Triptans help to restore the balance of a neurotransmitter called serotonin. Fluctuations in levels of serotonin are thought to be a main cause of migraine.  Q: Are over-the-counter medications for migraine effective? A: Over-the-counter, or "OTC," medications may be effective in relieving mild to moderate pain and associated symptoms of migraine. But you should see your caregiver before beginning any treatment regimen for migraine.  Q: What are preventive medications for migraine? A: Preventive medications for migraine are sometimes referred to as "prophylactic" treatments. They are used to reduce the frequency, severity, and length of migraine attacks. Examples of preventive medications include antiepileptic medications, antidepressants, beta-blockers, calcium channel blockers, and NSAIDs (nonsteroidal anti-inflammatory drugs). Q: Why are anticonvulsants used to treat migraine? A: During the past few years, there has been an increased interest in antiepileptic drugs for the prevention of migraine. They are sometimes referred to as "anticonvulsants". Both epilepsy and migraine may be caused by similar reactions in the brain.  Q: Why are antidepressants used to treat migraine? A: Antidepressants are typically used to treat people with depression. They may reduce migraine frequency by regulating chemical levels, such as serotonin, in the brain.  Q: What alternative therapies are used to treat migraine? A: The term "alternative therapies" is often used to describe treatments considered outside the scope of conventional Western medicine. Examples of alternative therapy include acupuncture, acupressure, and yoga. Another common alternative treatment is herbal therapy. Some herbs are believed to  relieve headache pain. Always discuss alternative therapies with your caregiver before proceeding. Some herbal products contain arsenic and other toxins. TENSION HEADACHES Q: What is a tension-type headache? What causes it? How can I treat it? A: Tension-type headaches occur randomly. They are often the result of temporary stress, anxiety, fatigue, or anger. Symptoms include soreness in your temples, a tightening band-like sensation around your head (a "vice-like" ache). Symptoms can also include a pulling feeling, pressure sensations, and contracting head and neck muscles. The headache begins in your forehead, temples, or the back of your head and neck. Treatment for tension-type headache may include over-the-counter or prescription medications. Treatment may also include self-help techniques such as relaxation training and biofeedback. CLUSTER HEADACHES Q: What is a cluster headache? What causes it? How can I treat it? A: Cluster headache gets its name because the attacks come in groups. The pain arrives with little, if any, warning. It is usually on one side of the head. A tearing or bloodshot eye and a runny nose on the same side of the headache may also accompany the pain. Cluster headaches are believed to be caused by chemical reactions in the brain. They have been described as the most severe and intense of any headache type. Treatment for cluster headache includes prescription medication and oxygen. SINUS HEADACHES Q: What is a sinus headache? What causes it? How can I treat it? A: When a cavity in the bones of the face and skull (a sinus) becomes inflamed, the inflammation will cause localized pain. This condition is usually the result of an allergic reaction, a tumor, or an infection. If  your headache is caused by a sinus blockage, such as an infection, you will probably have a fever. An x-ray will confirm a sinus blockage. Your caregiver's treatment might include antibiotics for the infection, as  well as antihistamines or decongestants.  REBOUND HEADACHES Q: What is a rebound headache? What causes it? How can I treat it? A: A pattern of taking acute headache medications too often can lead to a condition known as "rebound headache." A pattern of taking too much headache medication includes taking it more than 2 days per week or in excessive amounts. That means more than the label or a caregiver advises. With rebound headaches, your medications not only stop relieving pain, they actually begin to cause headaches. Doctors treat rebound headache by tapering the medication that is being overused. Sometimes your caregiver will gradually substitute a different type of treatment or medication. Stopping may be a challenge. Regularly overusing a medication increases the potential for serious side effects. Consult a caregiver if you regularly use headache medications more than 2 days per week or more than the label advises. ADDITIONAL QUESTIONS AND ANSWERS Q: What is biofeedback? A: Biofeedback is a self-help treatment. Biofeedback uses special equipment to monitor your body's involuntary physical responses. Biofeedback monitors:  Breathing.  Pulse.  Heart rate.  Temperature.  Muscle tension.  Brain activity. Biofeedback helps you refine and perfect your relaxation exercises. You learn to control the physical responses that are related to stress. Once the technique has been mastered, you do not need the equipment any more. Q: Are headaches hereditary? A: Four out of five (80%) of people that suffer report a family history of migraine. Scientists are not sure if this is genetic or a family predisposition. Despite the uncertainty, a child has a 50% chance of having migraine if one parent suffers. The child has a 75% chance if both parents suffer.  Q: Can children get headaches? A: By the time they reach high school, most young people have experienced some type of headache. Many safe and effective  approaches or medications can prevent a headache from occurring or stop it after it has begun.  Q: What type of doctor should I see to diagnose and treat my headache? A: Start with your primary caregiver. Discuss his or her experience and approach to headaches. Discuss methods of classification, diagnosis, and treatment. Your caregiver may decide to recommend you to a headache specialist, depending upon your symptoms or other physical conditions. Having diabetes, allergies, etc., may require a more comprehensive and inclusive approach to your headache. The National Headache Foundation will provide, upon request, a list of [facility] physician members in your state. Document Released: 03/23/2003 Document Revised: 03/25/2011 Document Reviewed: 08/31/2007 [facility] Patient Information 2014 [place].

## 2013-03-22 ENCOUNTER — Encounter (INDEPENDENT_AMBULATORY_CARE_PROVIDER_SITE_OTHER): Payer: Self-pay

## 2013-03-22 ENCOUNTER — Encounter: Payer: Self-pay | Admitting: Physician Assistant

## 2013-03-22 ENCOUNTER — Ambulatory Visit (INDEPENDENT_AMBULATORY_CARE_PROVIDER_SITE_OTHER): Payer: 59 | Admitting: Physician Assistant

## 2013-03-22 VITALS — BP 147/87 | HR 114 | Temp 98.0°F | Resp 16 | Ht 69.5 in | Wt 192.1 lb

## 2013-03-22 DIAGNOSIS — R002 Palpitations: Secondary | ICD-10-CM

## 2013-03-22 DIAGNOSIS — H8109 Meniere's disease, unspecified ear: Secondary | ICD-10-CM

## 2013-03-22 DIAGNOSIS — Z9109 Other allergy status, other than to drugs and biological substances: Secondary | ICD-10-CM

## 2013-03-22 DIAGNOSIS — R030 Elevated blood-pressure reading, without diagnosis of hypertension: Secondary | ICD-10-CM

## 2013-03-22 DIAGNOSIS — Z0000 Encounter for general adult medical examination without abnormal findings: Secondary | ICD-10-CM

## 2013-03-22 DIAGNOSIS — IMO0001 Reserved for inherently not codable concepts without codable children: Secondary | ICD-10-CM

## 2013-03-22 DIAGNOSIS — J45909 Unspecified asthma, uncomplicated: Secondary | ICD-10-CM

## 2013-03-22 LAB — URINALYSIS, ROUTINE W REFLEX MICROSCOPIC
BILIRUBIN URINE: NEGATIVE
HGB URINE DIPSTICK: NEGATIVE
Ketones, ur: NEGATIVE
Leukocytes, UA: NEGATIVE
NITRITE: NEGATIVE
Specific Gravity, Urine: 1.01 (ref 1.000–1.030)
Total Protein, Urine: NEGATIVE
Urine Glucose: NEGATIVE
Urobilinogen, UA: 0.2 (ref 0.0–1.0)
pH: 6.5 (ref 5.0–8.0)

## 2013-03-22 MED ORDER — ALBUTEROL SULFATE HFA 108 (90 BASE) MCG/ACT IN AERS: 2.0000 | INHALATION_SPRAY | Freq: Four times a day (QID) | RESPIRATORY_TRACT | Status: DC | PRN

## 2013-03-22 MED ORDER — MONTELUKAST SODIUM 10 MG PO TABS: 10.0000 mg | ORAL_TABLET | Freq: Every day | ORAL | Status: DC

## 2013-03-22 MED ORDER — DIAZEPAM 5 MG PO TABS: 5.0000 mg | ORAL_TABLET | Freq: Two times a day (BID) | ORAL | Status: DC | PRN

## 2013-03-22 MED ORDER — ONDANSETRON HCL 8 MG PO TABS: 8.0000 mg | ORAL_TABLET | Freq: Three times a day (TID) | ORAL | Status: DC | PRN

## 2013-03-22 NOTE — Patient Instructions (Signed)
Please obtain labs.  I will call you with your results.  Begin taking Singulair daily for allergy-relief.  Will also help with asthma symptoms.  Please start taking Symbicort, 2 puffs daily.  You will be contacted by a Pulmonologist for further evaluation. You will also be contacted by Cardiology for a holter monitor placement.  I will schedule follow-up with you based on your lab results.  Read information below on DASH diet for high blood pressure.  DASH Diet The DASH diet stands for "Dietary Approaches to Stop Hypertension." It is a healthy eating plan that has been shown to reduce high blood pressure (hypertension) in as little as 14 days, while also possibly providing other significant health benefits. These other health benefits include reducing the risk of breast cancer after menopause and reducing the risk of type 2 diabetes, heart disease, colon cancer, and stroke. Health benefits also include weight loss and slowing kidney failure in patients with chronic kidney disease.  DIET GUIDELINES  Limit salt (sodium). Your diet should contain less than 1500 mg of sodium daily.  Limit refined or processed carbohydrates. Your diet should include mostly whole grains. Desserts and added sugars should be used sparingly.  Include Erny amounts of heart-healthy fats. These types of fats include nuts, oils, and tub margarine. Limit saturated and trans fats. These fats have been shown to be harmful in the body. CHOOSING FOODS  The following food groups are based on a 2000 calorie diet. See your Registered Dietitian for individual calorie needs. Grains and Grain Products (6 to 8 servings daily)  Eat More Often: Whole-wheat bread, brown rice, whole-grain or wheat pasta, quinoa, popcorn without added fat or salt (air popped).  Eat Less Often: White bread, white pasta, white rice, cornbread. Vegetables (4 to 5 servings daily)  Eat More Often: Fresh, frozen, and canned vegetables. Vegetables may be raw,  steamed, roasted, or grilled with a minimal amount of fat.  Eat Less Often/Avoid: Creamed or fried vegetables. Vegetables in a cheese sauce. Fruit (4 to 5 servings daily)  Eat More Often: All fresh, canned (in natural juice), or frozen fruits. Dried fruits without added sugar. One hundred percent fruit juice ( cup [237 mL] daily).  Eat Less Often: Dried fruits with added sugar. Canned fruit in light or heavy syrup. [employer], Fish, and Poultry (2 servings or less daily. One serving is 3 to 4 oz [85-114 g]).  Eat More Often: Ninety percent or leaner ground beef, tenderloin, sirloin. Round cuts of beef, chicken breast, [place] breast. All fish. Grill, bake, or broil your meat. Nothing should be fried.  Eat Less Often/Avoid: Fatty cuts of meat, [place], or chicken leg, thigh, or wing. Fried cuts of meat or fish. Dairy (2 to 3 servings)  Eat More Often: Low-fat or fat-free milk, low-fat plain or light yogurt, reduced-fat or part-skim cheese.  Eat Less Often/Avoid: Milk (whole, 2%).Whole milk yogurt. Full-fat cheeses. Nuts, Seeds, and Legumes (4 to 5 servings per week)  Eat More Often: All without added salt.  Eat Less Often/Avoid: Salted nuts and seeds, canned beans with added salt. Fats and Sweets (limited)  Eat More Often: Vegetable oils, tub margarines without trans fats, sugar-free gelatin. Mayonnaise and salad dressings.  Eat Less Often/Avoid: Coconut oils, palm oils, butter, stick margarine, cream, half and half, cookies, candy, pie. FOR MORE INFORMATION The Dash Diet Eating Plan: www.dashdiet.org Document Released: 12/20/2010 Document Revised: 03/25/2011 Document Reviewed: 12/20/2010 [facility] Patient Information 2014 [place], [place].

## 2013-03-22 NOTE — Progress Notes (Signed)
Patient presents to clinic today to establish care.  Acute Concerns: Patient c/o occasional palpitations.  Denies chest pain, shortness of breath, vision changes, lightheadedness or dizziness.  Has had previous EKG this past year revealing NSR.  Patient is mildly tachycardic at today's visit.  BP mildly elevated.  Patient denies hx of hypertension.  Denies known history of thyroid disorder.  Patient endorses weight gain and sedentary lifestyle.  Patient has never worn Holter Monitor before.  Chronic Issues: Asthma -- Moderate, persistent. Endorses daytime inhaler use several times a day.  Endorses nighttime symptoms almost every night.  Patient is a non-smoker.  Patient has never had pulmonary function testing performed.  Is not followed by Pulmonology  Allergies -- Moderate seasonal allergies.  Takes benadryl nightly for symptoms.  Endorses some relief of symptoms.  Has been on Zyrtec and Allegra before with poor control of symptoms.  Denies having formal allergy testing.  Meniere's Disease -- diagnosed a few years ago.  Patient followed by ENT.  Valium prn during flare-ups.  Patient is monitoring his intake of salt. Denies recent flare-up of symptoms  Health Maintenance: Dental -- Overdue Vision -- Overdue Immunizations -- UTD  Past Medical History  Diagnosis Date  . Asthma   . Meniere's disease   . Environmental and seasonal allergies   . Depression     Resolved  . Chicken pox   . Hyperlipidemia     Past Surgical History  Procedure Laterality Date  . Nevus biopsy      Current Outpatient Prescriptions on File Prior to Visit  Medication Sig Dispense Refill  . diphenhydrAMINE (BENADRYL) 25 MG tablet Take 25 mg by mouth every 6 (six) hours as needed for itching or allergies.       No current facility-administered medications on file prior to visit.    No Known Allergies  Family History  Problem Relation Age of Onset  . Hyperlipidemia Father     Living  . Stroke Father    . Hypertension Father   . Alcohol abuse Mother     Living  . Diabetes Mellitus II Maternal Grandfather   . Hypertension Maternal Grandfather   . Heart failure Maternal Grandfather   . Kidney disease Maternal Grandfather   . Heart disease Maternal Grandmother   . Melanoma Paternal Grandmother   . Heart attack Paternal Grandfather   . Migraines Brother   . Healthy Brother     x2  . Drug abuse Sister     Died of Overdose    History   Social History  . Marital Status: Single    Spouse Name: N/A    Number of Children: N/A  . Years of Education: N/A   Occupational History  . Not on file.   Social History Main Topics  . Smoking status: Never Smoker   . Smokeless tobacco: Never Used  . Alcohol Use: Yes     Comment: social drinker  . Drug Use: No  . Sexual Activity: Yes    Birth Control/ Protection: None     Comment: male - 1 partner   Other Topics Concern  . Not on file   Social History Narrative  . No narrative on file   Review of Systems  Constitutional: Negative for fever and weight loss.  HENT: Positive for tinnitus. Negative for ear pain and hearing loss.   Eyes: Negative for blurred vision and double vision.  Respiratory: Positive for shortness of breath and wheezing.   Cardiovascular: Positive for palpitations. Negative for  chest pain.  Gastrointestinal: Negative for heartburn, nausea, vomiting, abdominal pain, diarrhea, constipation, blood in stool and melena.  Genitourinary: Negative for dysuria, urgency, frequency, hematuria and flank pain.  Neurological: Negative for dizziness, seizures, loss of consciousness and headaches.  Endo/Heme/Allergies: Positive for environmental allergies.  Psychiatric/Behavioral: Negative for depression, suicidal ideas, hallucinations and substance abuse. The patient has insomnia. The patient is not nervous/anxious.    BP 147/87  Pulse 114  Temp(Src) 98 F (36.7 C) (Oral)  Resp 16  Ht 5' 9.5" (1.765 m)  Wt 192 lb 2 oz  (87.147 kg)  BMI 27.97 kg/m2  SpO2 98%  Physical Exam  Vitals reviewed. Constitutional: He is oriented to person, place, and time and well-developed, well-nourished, and in no distress.  HENT:  Head: Normocephalic and atraumatic.  Right Ear: External ear normal.  Left Ear: External ear normal.  Nose: Nose normal.  Mouth/Throat: Oropharynx is clear and moist. No oropharyngeal exudate.  TM within normal limits bilaterally.  Eyes: Conjunctivae and EOM are normal. Pupils are equal, round, and reactive to light.  Neck: Normal range of motion. Neck supple.  Cardiovascular: Regular rhythm, normal heart sounds and intact distal pulses.   No murmur heard. tachycardic  Pulmonary/Chest: Effort normal and breath sounds normal. No respiratory distress. He has no wheezes. He has no rales. He exhibits no tenderness.  Abdominal: Soft. Bowel sounds are normal. He exhibits no distension and no mass. There is no tenderness. There is no rebound and no guarding.  Neurological: He is alert and oriented to person, place, and time.  Skin: Skin is warm and dry. No rash noted.  Psychiatric: Affect normal.    Recent Results (from the past 2160 hour(s))  GLUCOSE, CAPILLARY     Status: Abnormal   Collection Time    02/17/13 10:28 PM      Result Value Ref Range   Glucose-Capillary 111 (*) 70 - 99 mg/dL  CBC     Status: None   Collection Time    02/17/13 10:33 PM      Result Value Ref Range   WBC 5.7  4.0 - 10.5 K/uL   RBC 5.16  4.22 - 5.81 MIL/uL   Hemoglobin 15.1  13.0 - 17.0 g/dL   HCT 42.3  39.0 - 52.0 %   MCV 82.0  78.0 - 100.0 fL   MCH 29.3  26.0 - 34.0 pg   MCHC 35.7  30.0 - 36.0 g/dL   RDW 12.8  11.5 - 15.5 %   Platelets 193  150 - 400 K/uL  BASIC METABOLIC PANEL     Status: Abnormal   Collection Time    02/17/13 10:33 PM      Result Value Ref Range   Sodium 143  137 - 147 mEq/L   Potassium 3.9  3.7 - 5.3 mEq/L   Chloride 103  96 - 112 mEq/L   CO2 26  19 - 32 mEq/L   Glucose, Bld 94  70  - 99 mg/dL   BUN 16  6 - 23 mg/dL   Creatinine, Ser 1.18  0.50 - 1.35 mg/dL   Calcium 9.6  8.4 - 10.5 mg/dL   GFR calc non Af Amer 83 (*) >90 mL/min   GFR calc Af Amer >90  >90 mL/min   Comment: (NOTE)     The eGFR has been calculated using the CKD EPI equation.     This calculation has not been validated in all clinical situations.     eGFR's persistently <90  mL/min signify possible Chronic Kidney     Disease.  URINALYSIS, ROUTINE W REFLEX MICROSCOPIC     Status: None   Collection Time    03/22/13  3:45 PM      Result Value Ref Range   Color, Urine YELLOW  Yellow;Lt. Yellow   APPearance CLEAR  Clear   Specific Gravity, Urine 1.010  1.000-1.030   pH 6.5  5.0 - 8.0   Total Protein, Urine NEGATIVE  Negative   Urine Glucose NEGATIVE  Negative   Ketones, ur NEGATIVE  Negative   Bilirubin Urine NEGATIVE  Negative   Hgb urine dipstick NEGATIVE  Negative   Urobilinogen, UA 0.2  0.0 - 1.0   Leukocytes, UA NEGATIVE  Negative   Nitrite NEGATIVE  Negative   WBC, UA 0-2/hpf  0-2/hpf   RBC / HPF 0-2/hpf  0-2/hpf   Squamous Epithelial / LPF Rare(0-4/hpf)  Rare(0-4/hpf)  CBC WITH DIFFERENTIAL     Status: Abnormal   Collection Time    03/22/13  3:45 PM      Result Value Ref Range   WBC 5.6  4.5 - 10.5 K/uL   RBC 5.48  4.22 - 5.81 Mil/uL   Hemoglobin 15.8  13.0 - 17.0 g/dL   HCT 46.1  39.0 - 52.0 %   MCV 84.1  78.0 - 100.0 fl   MCHC 34.2  30.0 - 36.0 g/dL   RDW 13.1  11.5 - 14.6 %   Platelets 217.0  150.0 - 400.0 K/uL   Neutrophils Relative % 57.1  43.0 - 77.0 %   Lymphocytes Relative 26.1  12.0 - 46.0 %   Monocytes Relative 10.2  3.0 - 12.0 %   Eosinophils Relative 5.5 (*) 0.0 - 5.0 %   Basophils Relative 1.1  0.0 - 3.0 %   Neutro Abs 3.2  1.4 - 7.7 K/uL   Lymphs Abs 1.5  0.7 - 4.0 K/uL   Monocytes Absolute 0.6  0.1 - 1.0 K/uL   Eosinophils Absolute 0.3  0.0 - 0.7 K/uL   Basophils Absolute 0.1  0.0 - 0.1 K/uL  BASIC METABOLIC PANEL     Status: None   Collection Time    03/22/13   3:45 PM      Result Value Ref Range   Sodium 140  135 - 145 mEq/L   Potassium 4.7  3.5 - 5.1 mEq/L   Chloride 102  96 - 112 mEq/L   CO2 28  19 - 32 mEq/L   Glucose, Bld 93  70 - 99 mg/dL   BUN 15  6 - 23 mg/dL   Creatinine, Ser 1.0  0.4 - 1.5 mg/dL   Calcium 10.3  8.4 - 10.5 mg/dL   GFR 94.79  >60.00 mL/min  HEPATIC FUNCTION PANEL     Status: Abnormal   Collection Time    03/22/13  3:45 PM      Result Value Ref Range   Total Bilirubin 0.8  0.3 - 1.2 mg/dL   Bilirubin, Direct 0.1  0.0 - 0.3 mg/dL   Alkaline Phosphatase 72  39 - 117 U/L   AST 25  0 - 37 U/L   ALT 42  0 - 53 U/L   Total Protein 8.4 (*) 6.0 - 8.3 g/dL   Albumin 5.2  3.5 - 5.2 g/dL  TSH     Status: None   Collection Time    03/22/13  3:45 PM      Result Value Ref Range   TSH 0.97  0.35 - 5.50 uIU/mL  HEMOGLOBIN A1C     Status: None   Collection Time    03/22/13  3:45 PM      Result Value Ref Range   Hemoglobin A1C 5.3  4.6 - 6.5 %   Comment: Glycemic Control Guidelines for People with Diabetes:Non Diabetic:  <6%Goal of Therapy: <7%Additional Action Suggested:  >8%   LIPID PANEL     Status: Abnormal   Collection Time    03/22/13  3:45 PM      Result Value Ref Range   Cholesterol 282 (*) 0 - 200 mg/dL   Comment: ATP III Classification       Desirable:  < 200 mg/dL               Borderline High:  200 - 239 mg/dL          High:  > = 240 mg/dL   Triglycerides 446.0 (*) 0.0 - 149.0 mg/dL   Comment: Normal:  <150 mg/dLBorderline High:  150 - 199 mg/dL   HDL 53.50  >39.00 mg/dL   VLDL 89.2 (*) 0.0 - 40.0 mg/dL   LDL Cholesterol 139 (*) 0 - 99 mg/dL   Total CHOL/HDL Ratio 5     Comment:                Men          Women1/2 Average Risk     3.4          3.3Average Risk          5.0          4.42X Average Risk          9.6          7.13X Average Risk          15.0          11.0                        Assessment/Plan: Asthma SABA is not sufficient for long-term asthma therapy as patient is having frequent symptoms.   Sample of Symbicort given to patient.  Patient started on Singulair daily.  Referral to Pulmonology for assessment, PFTs and treatment placed.  Meniere disease Asymptomatic at present.  PRN valium refilled.  Follow-up with ENT as scheduled.  Low-salt diet.  Environmental allergies Will begin daily Singulair.  Visit for preventive health examination History reviewed.  Health maintenance UTD.  Will obtain fasting labs.  Elevated BP BP elevated.  Asymptomatic.  Giving other complaints, will obtain TSH.  DASH diet handout given.  Encourage aerobic activity.  Follow-up in 2 weeks for a BP recheck.  Palpitations Occasional.  Will obtain TSH.  Holter monitor ordered.  Follow-up in 2 weeks.

## 2013-03-22 NOTE — Progress Notes (Signed)
Pre visit review using our clinic review tool, if applicable. No additional management support is needed unless otherwise documented below in the visit note/SLS  

## 2013-03-23 DIAGNOSIS — R030 Elevated blood-pressure reading, without diagnosis of hypertension: Secondary | ICD-10-CM

## 2013-03-23 DIAGNOSIS — H8109 Meniere's disease, unspecified ear: Secondary | ICD-10-CM | POA: Insufficient documentation

## 2013-03-23 DIAGNOSIS — Z9109 Other allergy status, other than to drugs and biological substances: Secondary | ICD-10-CM | POA: Insufficient documentation

## 2013-03-23 DIAGNOSIS — Z0000 Encounter for general adult medical examination without abnormal findings: Secondary | ICD-10-CM | POA: Insufficient documentation

## 2013-03-23 DIAGNOSIS — IMO0001 Reserved for inherently not codable concepts without codable children: Secondary | ICD-10-CM | POA: Insufficient documentation

## 2013-03-23 DIAGNOSIS — R002 Palpitations: Secondary | ICD-10-CM | POA: Insufficient documentation

## 2013-03-23 LAB — TSH: TSH: 0.97 u[IU]/mL (ref 0.35–5.50)

## 2013-03-23 LAB — HEPATIC FUNCTION PANEL
ALBUMIN: 5.2 g/dL (ref 3.5–5.2)
ALT: 42 U/L (ref 0–53)
AST: 25 U/L (ref 0–37)
Alkaline Phosphatase: 72 U/L (ref 39–117)
Bilirubin, Direct: 0.1 mg/dL (ref 0.0–0.3)
Total Bilirubin: 0.8 mg/dL (ref 0.3–1.2)
Total Protein: 8.4 g/dL — ABNORMAL HIGH (ref 6.0–8.3)

## 2013-03-23 LAB — CBC WITH DIFFERENTIAL/PLATELET
Basophils Absolute: 0.1 10*3/uL (ref 0.0–0.1)
Basophils Relative: 1.1 % (ref 0.0–3.0)
Eosinophils Absolute: 0.3 10*3/uL (ref 0.0–0.7)
Eosinophils Relative: 5.5 % — ABNORMAL HIGH (ref 0.0–5.0)
HEMATOCRIT: 46.1 % (ref 39.0–52.0)
Hemoglobin: 15.8 g/dL (ref 13.0–17.0)
LYMPHS ABS: 1.5 10*3/uL (ref 0.7–4.0)
Lymphocytes Relative: 26.1 % (ref 12.0–46.0)
MCHC: 34.2 g/dL (ref 30.0–36.0)
MCV: 84.1 fl (ref 78.0–100.0)
MONO ABS: 0.6 10*3/uL (ref 0.1–1.0)
Monocytes Relative: 10.2 % (ref 3.0–12.0)
Neutro Abs: 3.2 10*3/uL (ref 1.4–7.7)
Neutrophils Relative %: 57.1 % (ref 43.0–77.0)
Platelets: 217 10*3/uL (ref 150.0–400.0)
RBC: 5.48 Mil/uL (ref 4.22–5.81)
RDW: 13.1 % (ref 11.5–14.6)
WBC: 5.6 10*3/uL (ref 4.5–10.5)

## 2013-03-23 LAB — LIPID PANEL
CHOL/HDL RATIO: 5
CHOLESTEROL: 282 mg/dL — AB (ref 0–200)
HDL: 53.5 mg/dL (ref >39.00)
LDL Cholesterol: 139 mg/dL — ABNORMAL HIGH (ref 0–99)
TRIGLYCERIDES: 446 mg/dL — AB (ref 0.0–149.0)
VLDL: 89.2 mg/dL — ABNORMAL HIGH (ref 0.0–40.0)

## 2013-03-23 LAB — BASIC METABOLIC PANEL
BUN: 15 mg/dL (ref 6–23)
CO2: 28 mEq/L (ref 19–32)
Calcium: 10.3 mg/dL (ref 8.4–10.5)
Chloride: 102 mEq/L (ref 96–112)
Creatinine, Ser: 1 mg/dL (ref 0.4–1.5)
GFR: 94.79 mL/min (ref >60.00)
Glucose, Bld: 93 mg/dL (ref 70–99)
Potassium: 4.7 mEq/L (ref 3.5–5.1)
Sodium: 140 mEq/L (ref 135–145)

## 2013-03-23 LAB — HEMOGLOBIN A1C: Hgb A1c MFr Bld: 5.3 % (ref 4.6–6.5)

## 2013-03-23 NOTE — Assessment & Plan Note (Signed)
Will begin daily Singulair.

## 2013-03-23 NOTE — Assessment & Plan Note (Signed)
Asymptomatic at present.  PRN valium refilled.  Follow-up with ENT as scheduled.  Low-salt diet.

## 2013-03-23 NOTE — Assessment & Plan Note (Signed)
History reviewed.  Health maintenance UTD.  Will obtain fasting labs.

## 2013-03-23 NOTE — Assessment & Plan Note (Signed)
BP elevated.  Asymptomatic.  Giving other complaints, will obtain TSH.  DASH diet handout given.  Encourage aerobic activity.  Follow-up in 2 weeks for a BP recheck.

## 2013-03-23 NOTE — Assessment & Plan Note (Signed)
SABA is not sufficient for long-term asthma therapy as patient is having frequent symptoms.  Sample of Symbicort given to patient.  Patient started on Singulair daily.  Referral to Pulmonology for assessment, PFTs and treatment placed.

## 2013-03-23 NOTE — Assessment & Plan Note (Signed)
Occasional.  Will obtain TSH.  Holter monitor ordered.  Follow-up in 2 weeks.

## 2013-04-21 ENCOUNTER — Encounter: Payer: Self-pay | Admitting: Pulmonary Disease

## 2013-04-21 ENCOUNTER — Ambulatory Visit (INDEPENDENT_AMBULATORY_CARE_PROVIDER_SITE_OTHER): Payer: 59 | Admitting: Pulmonary Disease

## 2013-04-21 VITALS — BP 130/84 | HR 89 | Temp 98.1°F | Ht 69.0 in | Wt 192.4 lb

## 2013-04-21 DIAGNOSIS — R0602 Shortness of breath: Secondary | ICD-10-CM

## 2013-04-21 DIAGNOSIS — J45909 Unspecified asthma, uncomplicated: Secondary | ICD-10-CM

## 2013-04-21 NOTE — Progress Notes (Signed)
   Subjective:    Patient ID: Steve Jacobs, male    DOB: 10/01/1985, 28 y.o.   MRN: [id number]  HPI The patient is a 28 year old male who I've been asked to see for possible asthma. The patient states that he has had breathing issues since he was 28 years old, and this has persisted through his adulthood. He has never really stayed on good treatment for a prolonged period of time, and currently is frustrated with his exertional tolerance. He will get very winded with any type of activity that is greater than moderate, and interferes with his exercise program. He does use albuterol as needed and sees a big difference. He was recently put on Symbicort as a trial, and saw a big difference in his breathing. He ran out of the sample approximately a week ago, and has seen a worsening of his symptoms. The patient states that he had breathing issues every day, and has to use his rescue inhaler. It is especially severe at night. He also has wheezing with exertional activities and exposure to pollen, but it is audible in nature. He also describes some throat tightness and fullness with his more severe episodes. He denies any significant cough, and although he has allergies they are not overly severe. He has never had pulmonary function studies.   Review of Systems  Constitutional: Negative for fever and unexpected weight change.  HENT: Negative for congestion, dental problem, ear pain, nosebleeds, postnasal drip, rhinorrhea, sinus pressure, sneezing, sore throat and trouble swallowing.        Allergies  Eyes: Negative for redness and itching.  Respiratory: Positive for shortness of breath and wheezing ( with "attack"). Negative for cough and chest tightness.   Cardiovascular: Negative for palpitations and leg swelling.  Gastrointestinal: Negative for nausea and vomiting.  Genitourinary: Negative for dysuria.  Musculoskeletal: Negative for joint swelling.  Skin: Negative for rash.  Neurological: Negative for  headaches.  Hematological: Does not bruise/bleed easily.  Psychiatric/Behavioral: Negative for dysphoric mood. The patient is not nervous/anxious.        Objective:   Physical Exam Constitutional:  Well developed, no acute distress  HENT:  Nares patent without discharge  Oropharynx without exudate, palate and uvula are normal  Eyes:  Perrla, eomi, no scleral icterus  Neck:  No JVD, no TMG  Cardiovascular:  Normal rate, regular rhythm, no rubs or gallops.  No murmurs        Intact distal pulses  Pulmonary :  Normal breath sounds, no stridor or respiratory distress   No rales, rhonchi, or wheezing  Abdominal:  Soft, nondistended, bowel sounds present.  No tenderness noted.   Musculoskeletal:  No lower extremity edema noted.  Lymph Nodes:  No cervical lymphadenopathy noted  Skin:  No cyanosis noted  Neurologic:  Alert, appropriate, moves all 4 extremities without obvious deficit.         Assessment & Plan:

## 2013-04-21 NOTE — Assessment & Plan Note (Signed)
The patient's history is very suggestive of asthma, however his spirometry today is totally normal. Unfortunately, he has been on Singulair and also Symbicort recently, and this can taint the results of the testing. I have outlined either taking the purist approach in taking him off all medication and repeating his spirometry when he has worsening symptoms, versus assuming the diagnosis of asthma as correct and starting him on maintenance inhaled corticosteroids. After a long discussion, we have decided on the latter. If he does not have a significant response to Asmanex alone, I would recommend stopping everything and proceeding with spirometry after increased symptoms.

## 2013-04-21 NOTE — Patient Instructions (Signed)
Stay on singulair for now. Will start on asmanex 2 inhalations each pm everyday whether you need or not. Call me in about 3 weeks with your response.  The goal is normal exertional tolerance, and using albuterol 2 times a week or less.  Will set up followup visit after the above.

## 2013-04-26 ENCOUNTER — Other Ambulatory Visit: Payer: Self-pay | Admitting: Physician Assistant

## 2013-04-26 DIAGNOSIS — H8109 Meniere's disease, unspecified ear: Secondary | ICD-10-CM

## 2013-04-26 NOTE — Telephone Encounter (Signed)
Meniere disease - Leeanne Rio, PA-C at 03/23/2013 10:00 PM    Status: Written Related Problem: Meniere disease   Asymptomatic at present. PRN valium refilled. Follow-up with ENT as scheduled. Low-salt diet.       Environmental allergies - Leeanne Rio, PA-C at 03/23/2013 10:01 PM

## 2013-05-11 ENCOUNTER — Encounter: Payer: Self-pay | Admitting: Physician Assistant

## 2013-05-11 DIAGNOSIS — Z9109 Other allergy status, other than to drugs and biological substances: Secondary | ICD-10-CM

## 2013-05-11 DIAGNOSIS — J45909 Unspecified asthma, uncomplicated: Secondary | ICD-10-CM

## 2013-05-12 MED ORDER — MONTELUKAST SODIUM 10 MG PO TABS: 10.0000 mg | ORAL_TABLET | Freq: Every day | ORAL | Status: DC

## 2013-05-12 NOTE — Telephone Encounter (Signed)
Refill granted -- 90-day supply with 1 refill.

## 2013-05-28 ENCOUNTER — Telehealth: Payer: Self-pay | Admitting: Pulmonary Disease

## 2013-05-28 NOTE — Telephone Encounter (Signed)
Per OV 04/21/13:  Patient Instructions      Stay on singulair for now. Will start on asmanex 2 inhalations each pm everyday whether you need or not. Call me in about 3 weeks with your response.  The goal is normal exertional tolerance, and using albuterol 2 times a week or less.   Will set up followup visit after the above  ---   lmomtcb x1

## 2013-05-28 NOTE — Telephone Encounter (Signed)
[name] the asthmanex you put me on isn't controlling my symptoms as well as Id like, I honestly feel as if the symbicort/singulair combo has been the best thus far. If you want me to come in I will, if not id really like to be put back on the low dose Symbicort, as long as that's okay with you. Thanks for your time! Steve Jacobs ----- Message ----- From: Steve Delton, MD Sent: 04/23/13 12:00 AM To: Steve Jacobs Subject: Questionnaire To ensure we are providing you the highest quality healthcare, we'd like to know how you are feeling after your recent visit. At your earliest convenience, please complete the brief follow-up assessment by clicking the Task: Questionnaire link listed above. Thank you for your time in helping [place] improve our services and for partnering with [place] in your wellness and care. Sincerely, Your Care Team

## 2013-05-31 NOTE — Telephone Encounter (Signed)
Pt scheduled for OV with [facility] 06/10/13 at 9:30. Offered to get recs from [facility] of how patient can best manage his symptoms between now and his appt--pt refused stating that he will just wait until [facility] can see him. Pt states that he wants to be as "medication free" as possible so that Dr Gwenette Greet can see the severity of his symptoms at his upcoming visit. Pt states that he will control his symptoms in the meantime with his albuterol as he always has.  Nothing further needed.

## 2013-05-31 NOTE — Telephone Encounter (Signed)
lmomtcb x1 

## 2013-06-10 ENCOUNTER — Encounter: Payer: Self-pay | Admitting: Pulmonary Disease

## 2013-06-10 ENCOUNTER — Ambulatory Visit (INDEPENDENT_AMBULATORY_CARE_PROVIDER_SITE_OTHER): Payer: 59 | Admitting: Pulmonary Disease

## 2013-06-10 VITALS — BP 150/118 | HR 123 | Temp 98.2°F | Ht 70.0 in | Wt 196.8 lb

## 2013-06-10 DIAGNOSIS — J45909 Unspecified asthma, uncomplicated: Secondary | ICD-10-CM

## 2013-06-10 MED ORDER — BUDESONIDE-FORMOTEROL FUMARATE 160-4.5 MCG/ACT IN AERO: 2.0000 | INHALATION_SPRAY | Freq: Two times a day (BID) | RESPIRATORY_TRACT | Status: DC

## 2013-06-10 MED ORDER — BUDESONIDE-FORMOTEROL FUMARATE 80-4.5 MCG/ACT IN AERO: 2.0000 | INHALATION_SPRAY | Freq: Two times a day (BID) | RESPIRATORY_TRACT | Status: DC

## 2013-06-10 NOTE — Assessment & Plan Note (Signed)
The patient has normal spirometry today by numerical criteria, however has lost 600 cc of FEV1 since the last visit. He also describes classic air trapping, and increased rescue inhaler use. There is no question that he has asthma, and he did not feel adequate control with an inhaled corticosteroid alone. We will therefore put him back on a LABA/ICS, and we'll monitor his progress. The patient is agreeable to this approach.

## 2013-06-10 NOTE — Addendum Note (Signed)
Addended by: Lilli Few on: 06/10/2013 11:54 AM   Modules accepted: Orders

## 2013-06-10 NOTE — Patient Instructions (Signed)
Will start on symbicort 160/4.5 2 puffs am and pm for next 2 weeks, then decrease to the 80/4.5 strength 2 puffs am and pm.  Keep mouth rinsed.  Can use albuterol for rescue, but if requiring more than 2 times a week, you need to call me followup with me again in 69mos, but call if not satisfied with response to treatment.

## 2013-06-10 NOTE — Progress Notes (Signed)
   Subjective:    Patient ID: Steve Jacobs, male    DOB: August 09, 1985, 28 y.o.   MRN: [id number]  HPI The patient comes in today for followup of his symptom of dyspnea, and this is felt secondary to asthma. His spirometry in the past has been normal, but his history has been classic. I had offered him starting on inhaled corticosteroids versus doing a methacholine challenge to establish a firm diagnosis. He stayed on the Asmanex for a period of time, and although he did fairly well, his exertional tolerance never got back to baseline and he required his rescue inhaler. He has now decided to come off medications completely, and to return for spirometry when his symptoms worsened. He is describing classic air trapping as well as dry cough.   Review of Systems  Constitutional: Negative for fever and unexpected weight change.  HENT: Negative for congestion, dental problem, ear pain, nosebleeds, postnasal drip, rhinorrhea, sinus pressure, sneezing, sore throat and trouble swallowing.   Eyes: Negative for redness and itching.  Respiratory: Positive for cough, chest tightness, shortness of breath and wheezing.   Cardiovascular: Negative for palpitations and leg swelling.  Gastrointestinal: Negative for nausea and vomiting.  Genitourinary: Negative for dysuria.  Musculoskeletal: Negative for joint swelling.  Skin: Negative for rash.  Neurological: Negative for headaches.  Hematological: Does not bruise/bleed easily.  Psychiatric/Behavioral: Negative for dysphoric mood. The patient is not nervous/anxious.        Objective:   Physical Exam Well-developed male in no acute distress Nose without purulence or discharge noted Neck without lymphadenopathy or thyromegaly Chest with fairly clear breath sounds, no active wheezing Cardiac exam with regular rate and rhythm Lower extremities without edema, no cyanosis Alert and oriented, moves all 4 extremities.       Assessment & Plan:

## 2013-07-28 ENCOUNTER — Encounter (HOSPITAL_COMMUNITY): Payer: Self-pay | Admitting: Emergency Medicine

## 2013-07-28 ENCOUNTER — Emergency Department (HOSPITAL_COMMUNITY)
Admission: EM | Admit: 2013-07-28 | Discharge: 2013-07-28 | Disposition: A | Discharge status: Home / Self Care | Payer: 59 | Source: Home / Self Care | Attending: Family Medicine | Admitting: Family Medicine

## 2013-07-28 DIAGNOSIS — J029 Acute pharyngitis, unspecified: Secondary | ICD-10-CM

## 2013-07-28 LAB — POCT RAPID STREP A: Streptococcus, Group A Screen (Direct): NEGATIVE

## 2013-07-28 MED ORDER — PREDNISONE 10 MG PO TABS: 30.0000 mg | ORAL_TABLET | Freq: Every day | ORAL | Status: DC

## 2013-07-28 NOTE — Discharge Instructions (Signed)
Thank you for coming in today. Call or go to the emergency room if you get worse, have trouble breathing, have chest pains, or palpitations.  I recommend following up with your doctor about your heart rate in the near future  Pharyngitis Pharyngitis is redness, pain, and swelling (inflammation) of your pharynx.  CAUSES  Pharyngitis is usually caused by infection. Most of the time, these infections are from viruses (viral) and are part of a cold. However, sometimes pharyngitis is caused by bacteria (bacterial). Pharyngitis can also be caused by allergies. Viral pharyngitis may be spread from person to person by coughing, sneezing, and personal items or utensils (cups, forks, spoons, toothbrushes). Bacterial pharyngitis may be spread from person to person by more intimate contact, such as kissing.  SIGNS AND SYMPTOMS  Symptoms of pharyngitis include:   Sore throat.   Tiredness (fatigue).   Low-grade fever.   Headache.  Joint pain and muscle aches.  Skin rashes.  Swollen lymph nodes.  Plaque-like film on throat or tonsils (often seen with bacterial pharyngitis). DIAGNOSIS  Your health care provider will ask you questions about your illness and your symptoms. Your medical history, along with a physical exam, is often all that is needed to diagnose pharyngitis. Sometimes, a rapid strep test is done. Other lab tests may also be done, depending on the suspected cause.  TREATMENT  Viral pharyngitis will usually get better in 3-4 days without the use of medicine. Bacterial pharyngitis is treated with medicines that kill germs (antibiotics).  HOME CARE INSTRUCTIONS   Drink enough water and fluids to keep your urine clear or pale yellow.   Only take over-the-counter or prescription medicines as directed by your health care provider:   If you are prescribed antibiotics, make sure you finish them even if you start to feel better.   Do not take aspirin.   Get lots of rest.    Gargle with 8 oz of salt water ( tsp of salt per 1 qt of water) as often as every 1-2 hours to soothe your throat.   Throat lozenges (if you are not at risk for choking) or sprays may be used to soothe your throat. SEEK MEDICAL CARE IF:   You have large, tender lumps in your neck.  You have a rash.  You cough up green, yellow-brown, or bloody spit. SEEK IMMEDIATE MEDICAL CARE IF:   Your neck becomes stiff.  You drool or are unable to swallow liquids.  You vomit or are unable to keep medicines or liquids down.  You have severe pain that does not go away with the use of recommended medicines.  You have trouble breathing (not caused by a stuffy nose). MAKE SURE YOU:   Understand these instructions.  Will watch your condition.  Will get help right away if you are not doing well or get worse. Document Released: 12/31/2004 Document Revised: 10/21/2012 Document Reviewed: 09/07/2012 [facility] Patient Information 2015 [place], [place]. This information is not intended to replace advice given to you by your health care provider. Make sure you discuss any questions you have with your health care provider.

## 2013-07-28 NOTE — ED Notes (Signed)
C/o sore throat onset 2 days ago.  C/o post nasal drip and occasional cough. Cough prod of dark green sputum.  No fever.  Pt. states he has white coat syndrome when he sees the doctor his BP and P go up.

## 2013-07-28 NOTE — ED Provider Notes (Signed)
Steve Jacobs is a 28 y.o. male who presents to Urgent Care today for sore throat. Patient has a two-day history of moderate sore throat worse with swallowing. This is associated with postnasal drip and congestion. He has tried ibuprofen and Tylenol which has helped. No fevers or chills nausea vomiting or diarrhea. Patient notes that he typically has elevated heart rate in the doctor's office and just took albuterol prior to presentation. He denies any chest pain or palpitations.   Past Medical History  Diagnosis Date  . Asthma   . Meniere's disease   . Environmental and seasonal allergies   . Depression     Resolved  . Chicken pox   . Hyperlipidemia    History  Substance Use Topics  . Smoking status: Never Smoker   . Smokeless tobacco: Never Used  . Alcohol Use: Yes     Comment: 2-3 beers nightly   ROS as above Medications: No current facility-administered medications for this encounter.   Current Outpatient Prescriptions  Medication Sig Dispense Refill  . albuterol (PROVENTIL HFA;VENTOLIN HFA) 108 (90 BASE) MCG/ACT inhaler Inhale 2 puffs into the lungs every 6 (six) hours as needed for wheezing.  6.7 g  2  . budesonide-formoterol (SYMBICORT) 80-4.5 MCG/ACT inhaler Inhale 2 puffs into the lungs 2 (two) times daily.  1 Inhaler  6  . cetirizine (ZYRTEC) 10 MG tablet Take 10 mg by mouth daily.      . diphenhydrAMINE (BENADRYL) 25 MG tablet Take 25 mg by mouth every 6 (six) hours as needed for itching or allergies.      Marland Kitchen ibuprofen (ADVIL,MOTRIN) 200 MG tablet Take 400 mg by mouth at bedtime.      Marland Kitchen ibuprofen (ADVIL,MOTRIN) 400 MG tablet Take 400 mg by mouth every 6 (six) hours as needed.      . montelukast (SINGULAIR) 10 MG tablet Take 1 tablet (10 mg total) by mouth at bedtime.  90 tablet  1  . diazepam (VALIUM) 5 MG tablet Take 1 tablet (5 mg total) by mouth every 12 (twelve) hours as needed for anxiety.  30 tablet  0  . ondansetron (ZOFRAN) 8 MG tablet Take 1 tablet (8 mg total) by  mouth every 8 (eight) hours as needed for nausea or vomiting.  20 tablet  0  . predniSONE (DELTASONE) 10 MG tablet Take 3 tablets (30 mg total) by mouth daily.  15 tablet  0    Exam:  BP 157/101  Pulse 117  Temp(Src) 97.9 F (36.6 C) (Oral)  Resp 20  SpO2 100% Gen: Well NAD HEENT: EOMI,  MMM posterior pharynx with cobblestoning. Tympanic membranes are normal appearing bilaterally. Lungs: Normal work of breathing. CTABL Heart: Tachycardia but regular no MRG Abd: NABS, Soft. NT, ND Exts: Brisk capillary refill, warm and well perfused.   Results for orders placed during the hospital encounter of 07/28/13 (from the past 24 hour(s))  POCT RAPID STREP A ([place])     Status: None   Collection Time    07/28/13  5:45 PM      Result Value Ref Range   Streptococcus, Group A Screen (Direct) NEGATIVE  NEGATIVE   No results found.  Assessment and Plan: 28 y.o. male with viral pharyngitis. Plan to treat with prednisone and over-the-counter Flonase. Followup with primary care provider regarding elevated blood pressure and heart rate.  Discussed warning signs or symptoms. Please see discharge instructions. Patient expresses understanding.    Gregor Hams, MD 07/28/13 [id number]

## 2013-07-30 LAB — CULTURE, GROUP A STREP

## 2013-08-02 ENCOUNTER — Ambulatory Visit (INDEPENDENT_AMBULATORY_CARE_PROVIDER_SITE_OTHER): Payer: 59 | Admitting: Physician Assistant

## 2013-08-02 ENCOUNTER — Telehealth: Payer: Self-pay | Admitting: Physician Assistant

## 2013-08-02 ENCOUNTER — Encounter: Payer: Self-pay | Admitting: Physician Assistant

## 2013-08-02 VITALS — BP 148/89 | HR 107 | Temp 98.4°F | Resp 16 | Ht 70.0 in | Wt 194.5 lb

## 2013-08-02 DIAGNOSIS — J209 Acute bronchitis, unspecified: Secondary | ICD-10-CM

## 2013-08-02 DIAGNOSIS — F411 Generalized anxiety disorder: Secondary | ICD-10-CM

## 2013-08-02 DIAGNOSIS — I10 Essential (primary) hypertension: Secondary | ICD-10-CM

## 2013-08-02 MED ORDER — LISINOPRIL 10 MG PO TABS: 10.0000 mg | ORAL_TABLET | Freq: Every day | ORAL | Status: DC

## 2013-08-02 MED ORDER — DIAZEPAM 2 MG PO TABS: 2.0000 mg | ORAL_TABLET | Freq: Two times a day (BID) | ORAL | Status: DC | PRN

## 2013-08-02 MED ORDER — AZITHROMYCIN 250 MG PO TABS: ORAL_TABLET | ORAL | Status: DC

## 2013-08-02 MED ORDER — ESCITALOPRAM OXALATE 10 MG PO TABS
10.0000 mg | ORAL_TABLET | Freq: Every day | ORAL | Status: DC
Start: 2013-08-02 — End: 2013-08-26

## 2013-08-02 NOTE — Progress Notes (Signed)
Pre visit review using our clinic review tool, if applicable. No additional management support is needed unless otherwise documented below in the visit note/SLS  

## 2013-08-02 NOTE — Progress Notes (Signed)
Patient presents to clinic today c/o productive cough, chest congestion, fatigue and intermittent fevers over the past 1.5 weeks.  Was seen at an urgent care and diagnosed with asthma exacerbation.  Patient was given steroid taper. Has one day left of medication.  Denies chest tightness or SOB.  Denies increased use of Albuterol inhaler.  Is taking Symbicort as directed.  Patient also c/o increased stress and anxiety.  Was able to control in the past by removing himself from the situation, but states now the anxiety is too much.  Denies depressed mood.  Denies suicidal thought or ideation.  Patient also with continued elevated BP despite dietary changes.  Denies chest pain, palpitations, lightheadedness, dizziness, vision changes.  Possibly stemming from anxiety.  Past Medical History  Diagnosis Date  . Asthma   . Meniere's disease   . Environmental and seasonal allergies   . Depression     Resolved  . Chicken pox   . Hyperlipidemia   . Essential hypertension, benign 08/03/2013    Current Outpatient Prescriptions on File Prior to Visit  Medication Sig Dispense Refill  . albuterol (PROVENTIL HFA;VENTOLIN HFA) 108 (90 BASE) MCG/ACT inhaler Inhale 2 puffs into the lungs every 6 (six) hours as needed for wheezing.  6.7 g  2  . budesonide-formoterol (SYMBICORT) 80-4.5 MCG/ACT inhaler Inhale 2 puffs into the lungs 2 (two) times daily.  1 Inhaler  6  . cetirizine (ZYRTEC) 10 MG tablet Take 10 mg by mouth daily.      . diphenhydrAMINE (BENADRYL) 25 MG tablet Take 25 mg by mouth every 6 (six) hours as needed for itching or allergies.      Marland Kitchen ibuprofen (ADVIL,MOTRIN) 200 MG tablet Take 400 mg by mouth at bedtime.      . montelukast (SINGULAIR) 10 MG tablet Take 1 tablet (10 mg total) by mouth at bedtime.  90 tablet  1  . ondansetron (ZOFRAN) 8 MG tablet Take 1 tablet (8 mg total) by mouth every 8 (eight) hours as needed for nausea or vomiting.  20 tablet  0  . predniSONE (DELTASONE) 10 MG tablet Take  3 tablets (30 mg total) by mouth daily.  15 tablet  0   No current facility-administered medications on file prior to visit.    No Known Allergies  Family History  Problem Relation Age of Onset  . Hyperlipidemia Father     Living  . Stroke Father   . Hypertension Father   . Alcohol abuse Mother     Living  . Diabetes Mellitus II Maternal Grandfather   . Hypertension Maternal Grandfather   . Heart failure Maternal Grandfather   . Kidney disease Maternal Grandfather   . Heart disease Maternal Grandmother   . Melanoma Paternal Grandmother   . Heart attack Paternal Grandfather   . Migraines Brother   . Healthy Brother     x2  . Drug abuse Sister     Died of Overdose    History   Social History  . Marital Status: Single    Spouse Name: N/A    Number of Children: N/A  . Years of Education: N/A   Social History Main Topics  . Smoking status: Never Smoker   . Smokeless tobacco: Never Used  . Alcohol Use: Yes     Comment: 2-3 beers nightly  . Drug Use: No  . Sexual Activity: Yes    Birth Control/ Protection: None     Comment: male - 1 partner   Other Topics Concern  .  None   Social History Narrative  . None   Review of Systems - See HPI.  All other ROS are negative.  BP 148/89  Pulse 107  Temp(Src) 98.4 F (36.9 C) (Oral)  Resp 16  Ht 5\' 10"  (1.778 m)  Wt 194 lb 8 oz (88.225 kg)  BMI 27.91 kg/m2  SpO2 97%  Physical Exam  Vitals reviewed. Constitutional: He is oriented to person, place, and time and well-developed, well-nourished, and in no distress.  HENT:  Head: Normocephalic and atraumatic.  Right Ear: External ear normal.  Left Ear: External ear normal.  Nose: Nose normal.  Mouth/Throat: Oropharynx is clear and moist. No oropharyngeal exudate.  TM within normal limits bilaterally.  Eyes: Conjunctivae are normal. Pupils are equal, round, and reactive to light.  Neck: Neck supple. No thyromegaly present.  Cardiovascular: Normal rate, regular  rhythm, normal heart sounds and intact distal pulses.   Pulmonary/Chest: Effort normal and breath sounds normal. No respiratory distress. He has no wheezes. He has no rales. He exhibits no tenderness.  Lymphadenopathy:    He has no cervical adenopathy.  Neurological: He is alert and oriented to person, place, and time.  Skin: Skin is warm and dry. No rash noted.  Psychiatric: Affect normal.    Recent Results (from the past 2160 hour(s))  POCT RAPID STREP A ([place])     Status: None   Collection Time    07/28/13  5:45 PM      Result Value Ref Range   Streptococcus, Group A Screen (Direct) NEGATIVE  NEGATIVE  CULTURE, GROUP A STREP     Status: None   Collection Time    07/28/13  7:39 PM      Result Value Ref Range   Specimen Description THROAT     Special Requests NONE     Culture       Value: No Beta Hemolytic Streptococci Isolated     Performed at [facility]   Report Status 07/30/2013 FINAL     Assessment/Plan: Acute bronchitis Rx Azithromycin.  Increase fluids.  Rest. Mucinex. Probiotic. Finish steroid.  Continue asthma medications.  Place a humidifier in the bedroom.  Essential hypertension, benign DASH diet encouraged. Rx Lisinopril 10 mg daily. Follow-up in 2 weeks.  Anxiety state, unspecified Rx Lexapro - Start with 5 mg daily x 1 week.  Then increase to 10 mg daily.  Valium up to BID prn for anxiety.  Follow-up in 4 weeks.

## 2013-08-02 NOTE — Telephone Encounter (Signed)
Relevant patient education assigned to patient using Emmi. ° °

## 2013-08-02 NOTE — Patient Instructions (Signed)
For bronchitis -- please take antibiotic as directed.  FInish steroid course. Continue Mucinex.  Increase fluid intake.  Rest.  Place a humidifier in bedroom.  For high blood pressure -- please take lisinopril as directed.  Limit salt intake.  Increase cardio exercise.  Follow-up in 1 month.  For anxiety -- after you have completed antibiotic, start Lexapro taking as directed.  You can take Valium twice daily as directed for anxiety.  Follow-up in 1 month.   For Cholesterol -- return to the [facility] office for fasting labs.   DASH Eating Plan DASH stands for "Dietary Approaches to Stop Hypertension." The DASH eating plan is a healthy eating plan that has been shown to reduce high blood pressure (hypertension). Additional health benefits may include reducing the risk of type 2 diabetes mellitus, heart disease, and stroke. The DASH eating plan may also help with weight loss. WHAT DO I NEED TO KNOW ABOUT THE DASH EATING PLAN? For the DASH eating plan, you will follow these general guidelines:  Choose foods with a percent daily value for sodium of less than 5% (as listed on the food label).  Use salt-free seasonings or herbs instead of table salt or sea salt.  Check with your health care provider or pharmacist before using salt substitutes.  Eat lower-sodium products, often labeled as "lower sodium" or "no salt added."  Eat fresh foods.  Eat more vegetables, fruits, and low-fat dairy products.  Choose whole grains. Look for the word "whole" as the first word in the ingredient list.  Choose fish and skinless chicken or [place] more often than red meat. Limit fish, poultry, and meat to 6 oz (170 g) each day.  Limit sweets, desserts, sugars, and sugary drinks.  Choose heart-healthy fats.  Limit cheese to 1 oz (28 g) per day.  Eat more home-cooked food and less restaurant, buffet, and fast food.  Limit fried foods.  Cook foods using methods other than frying.  Limit canned  vegetables. If you do use them, rinse them well to decrease the sodium.  When eating at a restaurant, ask that your food be prepared with less salt, or no salt if possible. WHAT FOODS CAN I EAT? Seek help from a dietitian for individual calorie needs. Grains Whole grain or whole wheat bread. Brown rice. Whole grain or whole wheat pasta. Quinoa, bulgur, and whole grain cereals. Low-sodium cereals. Corn or whole wheat flour tortillas. Whole grain cornbread. Whole grain crackers. Low-sodium crackers. Vegetables Fresh or frozen vegetables (raw, steamed, roasted, or grilled). Low-sodium or reduced-sodium tomato and vegetable juices. Low-sodium or reduced-sodium tomato sauce and paste. Low-sodium or reduced-sodium canned vegetables.  Fruits All fresh, canned (in natural juice), or frozen fruits. Meat and Other Protein Products Ground beef (85% or leaner), grass-fed beef, or beef trimmed of fat. Skinless chicken or [place]. Ground chicken or [place]. Pork trimmed of fat. All fish and seafood. Eggs. Dried beans, peas, or lentils. Unsalted nuts and seeds. Unsalted canned beans. Dairy Low-fat dairy products, such as skim or 1% milk, 2% or reduced-fat cheeses, low-fat ricotta or cottage cheese, or plain low-fat yogurt. Low-sodium or reduced-sodium cheeses. Fats and Oils Tub margarines without trans fats. Light or reduced-fat mayonnaise and salad dressings (reduced sodium). Avocado. Safflower, olive, or canola oils. Natural peanut or almond butter. Other Unsalted popcorn and pretzels. The items listed above may not be a complete list of recommended foods or beverages. Contact your dietitian for more options. WHAT FOODS ARE NOT RECOMMENDED? Grains White bread. White pasta.  White rice. Refined cornbread. Bagels and croissants. Crackers that contain trans fat. Vegetables Creamed or fried vegetables. Vegetables in a cheese sauce. Regular canned vegetables. Regular canned tomato sauce and paste. Regular tomato  and vegetable juices. Fruits Dried fruits. Canned fruit in light or heavy syrup. Fruit juice. Meat and Other Protein Products Fatty cuts of meat. Ribs, chicken wings, bacon, sausage, bologna, salami, chitterlings, fatback, hot dogs, bratwurst, and packaged luncheon meats. Salted nuts and seeds. Canned beans with salt. Dairy Whole or 2% milk, cream, half-and-half, and cream cheese. Whole-fat or sweetened yogurt. Full-fat cheeses or blue cheese. Nondairy creamers and whipped toppings. Processed cheese, cheese spreads, or cheese curds. Condiments Onion and garlic salt, seasoned salt, table salt, and sea salt. Canned and packaged gravies. Worcestershire sauce. Tartar sauce. Barbecue sauce. Teriyaki sauce. Soy sauce, including reduced sodium. Steak sauce. Fish sauce. Oyster sauce. Cocktail sauce. Horseradish. Ketchup and mustard. Meat flavorings and tenderizers. Bouillon cubes. Hot sauce. Tabasco sauce. Marinades. Taco seasonings. Relishes. Fats and Oils Butter, stick margarine, lard, shortening, ghee, and bacon fat. Coconut, palm kernel, or palm oils. Regular salad dressings. Other Pickles and olives. Salted popcorn and pretzels. The items listed above may not be a complete list of foods and beverages to avoid. Contact your dietitian for more information. WHERE CAN I FIND MORE INFORMATION? National Heart, Lung, and Blood Institute: travelstabloid.com Document Released: 12/20/2010 Document Revised: 01/05/2013 Document Reviewed: 11/04/2012 [facility] Patient Information 2015 [place], [place]. This information is not intended to replace advice given to you by your health care provider. Make sure you discuss any questions you have with your health care provider.

## 2013-08-03 ENCOUNTER — Encounter: Payer: Self-pay | Admitting: Physician Assistant

## 2013-08-03 DIAGNOSIS — J209 Acute bronchitis, unspecified: Secondary | ICD-10-CM | POA: Insufficient documentation

## 2013-08-03 DIAGNOSIS — F411 Generalized anxiety disorder: Secondary | ICD-10-CM | POA: Insufficient documentation

## 2013-08-03 DIAGNOSIS — I10 Essential (primary) hypertension: Secondary | ICD-10-CM

## 2013-08-03 HISTORY — DX: Essential (primary) hypertension: I10

## 2013-08-03 NOTE — Assessment & Plan Note (Signed)
Rx Lexapro - Start with 5 mg daily x 1 week.  Then increase to 10 mg daily.  Valium up to BID prn for anxiety.  Follow-up in 4 weeks.

## 2013-08-03 NOTE — Assessment & Plan Note (Signed)
DASH diet encouraged. Rx Lisinopril 10 mg daily. Follow-up in 2 weeks.

## 2013-08-03 NOTE — Assessment & Plan Note (Signed)
Rx Azithromycin.  Increase fluids.  Rest. Mucinex. Probiotic. Finish steroid.  Continue asthma medications.  Place a humidifier in the bedroom.

## 2013-08-26 ENCOUNTER — Other Ambulatory Visit: Payer: Self-pay | Admitting: Physician Assistant

## 2013-08-26 NOTE — Telephone Encounter (Signed)
Rx sent to pharmacy. Pt is due for appt. Please call and schedule. LDM

## 2013-08-26 NOTE — Telephone Encounter (Signed)
Left message for patient to return my call.

## 2013-08-26 NOTE — Telephone Encounter (Signed)
Informed patient of medication refill and he scheduled appointment for 8/20.  Also, patient states that he had requested a new rx for diazepam

## 2013-08-26 NOTE — Telephone Encounter (Signed)
Patient not due for refill of valium until 09/02/13.

## 2013-09-02 ENCOUNTER — Encounter: Payer: Self-pay | Admitting: Physician Assistant

## 2013-09-02 ENCOUNTER — Ambulatory Visit (INDEPENDENT_AMBULATORY_CARE_PROVIDER_SITE_OTHER): Payer: 59 | Admitting: Physician Assistant

## 2013-09-02 VITALS — BP 126/86 | HR 88 | Temp 98.0°F | Resp 16 | Ht 70.0 in | Wt 193.2 lb

## 2013-09-02 DIAGNOSIS — F411 Generalized anxiety disorder: Secondary | ICD-10-CM

## 2013-09-02 DIAGNOSIS — I10 Essential (primary) hypertension: Secondary | ICD-10-CM

## 2013-09-02 DIAGNOSIS — E782 Mixed hyperlipidemia: Secondary | ICD-10-CM

## 2013-09-02 LAB — LIPID PANEL
CHOLESTEROL: 255 mg/dL — AB (ref 0–200)
HDL: 55 mg/dL (ref >39)
LDL CALC: 155 mg/dL — AB (ref 0–99)
Total CHOL/HDL Ratio: 4.6 Ratio
Triglycerides: 224 mg/dL — ABNORMAL HIGH (ref <150)
VLDL: 45 mg/dL — ABNORMAL HIGH (ref 0–40)

## 2013-09-02 LAB — BASIC METABOLIC PANEL
BUN: 13 mg/dL (ref 6–23)
CHLORIDE: 98 meq/L (ref 96–112)
CO2: 29 mEq/L (ref 19–32)
CREATININE: 0.88 mg/dL (ref 0.50–1.35)
Calcium: 9.9 mg/dL (ref 8.4–10.5)
Glucose, Bld: 98 mg/dL (ref 70–99)
Potassium: 4.5 mEq/L (ref 3.5–5.3)
Sodium: 138 mEq/L (ref 135–145)

## 2013-09-02 MED ORDER — DIAZEPAM 2 MG PO TABS: 2.0000 mg | ORAL_TABLET | Freq: Two times a day (BID) | ORAL | Status: DC | PRN

## 2013-09-02 NOTE — Patient Instructions (Addendum)
Please continue medications as directed. I will call you with your lab results.  As long as everything looks good, we will follow-up next March for your annual exam.

## 2013-09-02 NOTE — Progress Notes (Signed)
Pre visit review using our clinic review tool, if applicable. No additional management support is needed unless otherwise documented below in the visit note/SLS  

## 2013-09-02 NOTE — Progress Notes (Signed)
Patient presents to clinic today for medication management.  Patient is currently on Lisinopril 10 mg daily for essential hypertension.  Endorses taking medication daily as directed. Denies chronic cough. BP is normotensive in clinic. Denies chest pain, palpitations, lightheadedness, dizziness, frequent headache or vision changes.  Patient is currently on Lexapro 10 mg daily and Valium 2 mg when necessary for anxiety. Patient endorses good relief of symptoms with medicines. Denies panic attack. Denies suicidal thought or ideation.  Past Medical History  Diagnosis Date  . Asthma   . Meniere's disease   . Environmental and seasonal allergies   . Depression     Resolved  . Chicken pox   . Hyperlipidemia   . Essential hypertension, benign 08/03/2013    Current Outpatient Prescriptions on File Prior to Visit  Medication Sig Dispense Refill  . albuterol (PROVENTIL HFA;VENTOLIN HFA) 108 (90 BASE) MCG/ACT inhaler Inhale 2 puffs into the lungs every 6 (six) hours as needed for wheezing.  6.7 g  2  . budesonide-formoterol (SYMBICORT) 80-4.5 MCG/ACT inhaler Inhale 2 puffs into the lungs 2 (two) times daily.  1 Inhaler  6  . cetirizine (ZYRTEC) 10 MG tablet Take 10 mg by mouth daily.      . diphenhydrAMINE (BENADRYL) 25 MG tablet Take 25 mg by mouth every 6 (six) hours as needed for itching or allergies.      Marland Kitchen escitalopram (LEXAPRO) 10 MG tablet TAKE 1 TABLET BY MOUTH DAILY.  30 tablet  0  . ibuprofen (ADVIL,MOTRIN) 200 MG tablet Take 400 mg by mouth at bedtime.      Marland Kitchen lisinopril (PRINIVIL,ZESTRIL) 10 MG tablet Take 1 tablet (10 mg total) by mouth daily.  30 tablet  3  . montelukast (SINGULAIR) 10 MG tablet Take 1 tablet (10 mg total) by mouth at bedtime.  90 tablet  1  . ondansetron (ZOFRAN) 8 MG tablet Take 1 tablet (8 mg total) by mouth every 8 (eight) hours as needed for nausea or vomiting.  20 tablet  0   No current facility-administered medications on file prior to visit.    No Known  Allergies  Family History  Problem Relation Age of Onset  . Hyperlipidemia Father     Living  . Stroke Father   . Hypertension Father   . Alcohol abuse Mother     Living  . Diabetes Mellitus II Maternal Grandfather   . Hypertension Maternal Grandfather   . Heart failure Maternal Grandfather   . Kidney disease Maternal Grandfather   . Heart disease Maternal Grandmother   . Melanoma Paternal Grandmother   . Heart attack Paternal Grandfather   . Migraines Brother   . Healthy Brother     x2  . Drug abuse Sister     Died of Overdose    History   Social History  . Marital Status: Single    Spouse Name: N/A    Number of Children: N/A  . Years of Education: N/A   Social History Main Topics  . Smoking status: Never Smoker   . Smokeless tobacco: Never Used  . Alcohol Use: Yes     Comment: 2-3 beers nightly  . Drug Use: No  . Sexual Activity: Yes    Birth Control/ Protection: None     Comment: male - 1 partner   Other Topics Concern  . None   Social History Narrative  . None   Review of Systems - See HPI.  All other ROS are negative.  BP 126/86  Pulse  88  Temp(Src) 98 F (36.7 C) (Oral)  Resp 16  Ht 5\' 10"  (1.778 m)  Wt 193 lb 4 oz (87.658 kg)  BMI 27.73 kg/m2  SpO2 99%  Physical Exam  Vitals reviewed. Constitutional: He is oriented to person, place, and time and well-developed, well-nourished, and in no distress.  HENT:  Head: Normocephalic and atraumatic.  Eyes: Conjunctivae are normal.  Neck: Neck supple.  Cardiovascular: Normal rate, regular rhythm, normal heart sounds and intact distal pulses.   Pulmonary/Chest: Effort normal and breath sounds normal. No respiratory distress. He has no wheezes. He has no rales. He exhibits no tenderness.  Neurological: He is alert and oriented to person, place, and time.  Skin: Skin is warm and dry. No rash noted.  Psychiatric: Affect normal.    Recent Results (from the past 2160 hour(s))  POCT RAPID STREP A ([place])     Status: None   Collection Time    07/28/13  5:45 PM      Result Value Ref Range   Streptococcus, Group A Screen (Direct) NEGATIVE  NEGATIVE  CULTURE, GROUP A STREP     Status: None   Collection Time    07/28/13  7:39 PM      Result Value Ref Range   Specimen Description THROAT     Special Requests NONE     Culture       Value: No Beta Hemolytic Streptococci Isolated     Performed at [facility]   Report Status 07/30/2013 FINAL    LIPID PANEL     Status: Abnormal   Collection Time    09/02/13  4:02 PM      Result Value Ref Range   Cholesterol 255 (*) 0 - 200 mg/dL   Comment: ATP III Classification:           < 200        mg/dL        Desirable          200 - 239     mg/dL        Borderline High          >= 240        mg/dL        High         Triglycerides 224 (*) <150 mg/dL   HDL 55  >39 mg/dL   Total CHOL/HDL Ratio 4.6     VLDL 45 (*) 0 - 40 mg/dL   LDL Cholesterol 155 (*) 0 - 99 mg/dL   Comment:       Total Cholesterol/HDL Ratio:CHD Risk                            Coronary Heart Disease Risk Table                                            Men       Women              1/2 Average Risk              3.4        3.3                  Average Risk  5.0        4.4               2X Average Risk              9.6        7.1               3X Average Risk             23.4       11.0     Use the calculated Patient Ratio above and the CHD Risk table      to determine the patient's CHD Risk.     ATP III Classification (LDL):           < 100        mg/dL         Optimal          100 - 129     mg/dL         Near or Above Optimal          130 - 159     mg/dL         Borderline High          160 - 189     mg/dL         High           > 190        mg/dL         Very High        BASIC METABOLIC PANEL     Status: None   Collection Time    09/02/13  4:02 PM      Result Value Ref Range   Sodium 138  135 - 145 mEq/L   Potassium 4.5  3.5 - 5.3 mEq/L    Chloride 98  96 - 112 mEq/L   CO2 29  19 - 32 mEq/L   Glucose, Bld 98  70 - 99 mg/dL   BUN 13  6 - 23 mg/dL   Creat 0.88  0.50 - 1.35 mg/dL   Calcium 9.9  8.4 - 10.5 mg/dL    Assessment/Plan: Essential hypertension, benign BP well controlled. Continue lisinopril 10 mg daily. Encourage DASH diet.  Anxiety state, unspecified Doing well. Continue current regimen. Followup in 3 months.  Elevated triglycerides with high cholesterol Patient is fasting. Will obtain repeat lipid panel note patient is on a fish oil supplement daily.

## 2013-09-03 ENCOUNTER — Telehealth: Payer: Self-pay | Admitting: Physician Assistant

## 2013-09-03 NOTE — Telephone Encounter (Signed)
TGL have improved from last check but are still mildly elevated.  I would like for him to continue the Fish Oil capsules, can take two 1000 mg tablets a day if not already doing so.  LDL cholesterol is still mildly elevated.  He needs to attempt to decrease intake of saturated fats and foods high in cholesterol.  Increase exercise.  Will check cholesterol in 6 months.  If LDL cholesterol continues to increase, he may need a prescription medication for this.

## 2013-09-06 DIAGNOSIS — E782 Mixed hyperlipidemia: Secondary | ICD-10-CM | POA: Insufficient documentation

## 2013-09-06 NOTE — Assessment & Plan Note (Addendum)
Patient is fasting. Will obtain repeat lipid panel note patient is on a fish oil supplement daily.

## 2013-09-06 NOTE — Assessment & Plan Note (Signed)
BP well controlled. Continue lisinopril 10 mg daily. Encourage DASH diet.

## 2013-09-06 NOTE — Assessment & Plan Note (Signed)
Doing well. Continue current regimen. Followup in 3 months.

## 2013-10-04 ENCOUNTER — Other Ambulatory Visit: Payer: Self-pay | Admitting: Physician Assistant

## 2013-10-05 NOTE — Telephone Encounter (Signed)
eScribe request from [facility] for refill on Diazepam Last filled - 08.20.15, #[id number] eScribe request from [facility] for refill on Lexapro Last filled - 08.13.15, #[id number] Last AEX - 08.20.15 Next AEX - 6 Mths Please Advise on refills/SLS

## 2013-10-06 ENCOUNTER — Telehealth: Payer: Self-pay

## 2013-10-06 NOTE — Telephone Encounter (Signed)
Rx faced to [place].

## 2013-10-08 ENCOUNTER — Encounter: Payer: Self-pay | Admitting: Physician Assistant

## 2013-10-24 ENCOUNTER — Telehealth: Payer: 59 | Admitting: Physician Assistant

## 2013-10-24 NOTE — Progress Notes (Signed)
We are sorry that you are not feeling well.  Here is how we plan to help!  Your cough may represent a more serious condition. You will need a face-to-face visit for complicated/severe symptoms which could represent a more serious condition.  If you are having a true medical emergency please call 911.  If you need an urgent face to face visit, [facility] has four urgent care centers for your convenience.  . [facility]  [phone] Get Driving Directions Find a Provider at this Location  [address] [place], [place] [address] . 8 am to 8 pm Monday-Friday . 9 am to 7 pm Saturday-Sunday  . [facility]  [phone] Get Driving Directions Find a Provider at this Location  [address], [address] [place], [place] [address] . 8 am to 8 pm Monday-Friday . 9 am to 6 pm Saturday . 11 am to 6 pm Sunday   .  [facility]  [phone] Get Driving Directions  [address] [place], [place] [address] . 8 am to 8 pm Monday-Friday . 9 am to 4 pm Saturday-Sunday   . [facility] (a walk in primary care provider)  [phone]  Get Driving Directions Find a Provider at this Location  [address] [place], [place] [address] . 8 am to 8:30 pm Monday-Thursday . 8 am to 6 pm Friday . 8 am to 4 pm Saturday-Sunday  Your e-visit answers were reviewed by a board certified advanced clinical practitioner to complete your personal care plan.  Depending on the condition, your plan could have included both over the counter or prescription medications.  You will get an e-mail in the next two days asking about your experience.  I hope that your e-visit has been valuable and will speed your recovery . Thank you for choosing an e-visit.    

## 2013-10-26 ENCOUNTER — Telehealth: Payer: Self-pay | Admitting: *Deleted

## 2013-10-26 ENCOUNTER — Ambulatory Visit: Payer: 59 | Admitting: Physician Assistant

## 2013-10-26 NOTE — Telephone Encounter (Signed)
See below

## 2013-10-26 NOTE — Telephone Encounter (Signed)
Pt did not show for appointment 10/26/2013 at 7am for "losing voice"

## 2013-10-26 NOTE — Telephone Encounter (Signed)
No charge. 

## 2013-11-08 ENCOUNTER — Other Ambulatory Visit: Payer: Self-pay | Admitting: Family Medicine

## 2013-11-26 ENCOUNTER — Other Ambulatory Visit: Payer: Self-pay | Admitting: Physician Assistant

## 2013-11-26 ENCOUNTER — Other Ambulatory Visit: Payer: Self-pay | Admitting: Family Medicine

## 2013-11-29 NOTE — Telephone Encounter (Signed)
Rx request to pharmacy/SLS  

## 2013-11-30 NOTE — Telephone Encounter (Signed)
eScribe request from [facility] for refill on Diazepam Last filled - 09.22.15, #[id number] Last AEX - 08.20.15 Next AEX - 6 Mths. Please Advise on refills/SLS

## 2013-12-01 NOTE — Telephone Encounter (Signed)
Rx request faxed to pharmacy/SLS  

## 2013-12-01 NOTE — Telephone Encounter (Signed)
Refill granted.  Rx printed, signed and ready to be faxed. 

## 2013-12-13 ENCOUNTER — Ambulatory Visit: Payer: 59 | Admitting: Pulmonary Disease

## 2013-12-18 ENCOUNTER — Emergency Department (HOSPITAL_BASED_OUTPATIENT_CLINIC_OR_DEPARTMENT_OTHER): Payer: 59

## 2013-12-18 ENCOUNTER — Emergency Department (HOSPITAL_BASED_OUTPATIENT_CLINIC_OR_DEPARTMENT_OTHER)
Admission: EM | Admit: 2013-12-18 | Discharge: 2013-12-19 | Disposition: A | Discharge status: Home / Self Care | Payer: 59 | Attending: Emergency Medicine | Admitting: Emergency Medicine

## 2013-12-18 ENCOUNTER — Encounter (HOSPITAL_BASED_OUTPATIENT_CLINIC_OR_DEPARTMENT_OTHER): Payer: Self-pay

## 2013-12-18 DIAGNOSIS — Z8619 Personal history of other infectious and parasitic diseases: Secondary | ICD-10-CM | POA: Diagnosis not present

## 2013-12-18 DIAGNOSIS — F329 Major depressive disorder, single episode, unspecified: Secondary | ICD-10-CM | POA: Insufficient documentation

## 2013-12-18 DIAGNOSIS — J45909 Unspecified asthma, uncomplicated: Secondary | ICD-10-CM | POA: Diagnosis not present

## 2013-12-18 DIAGNOSIS — Y9289 Other specified places as the place of occurrence of the external cause: Secondary | ICD-10-CM | POA: Insufficient documentation

## 2013-12-18 DIAGNOSIS — R609 Edema, unspecified: Secondary | ICD-10-CM

## 2013-12-18 DIAGNOSIS — Z79899 Other long term (current) drug therapy: Secondary | ICD-10-CM | POA: Insufficient documentation

## 2013-12-18 DIAGNOSIS — S93402A Sprain of unspecified ligament of left ankle, initial encounter: Secondary | ICD-10-CM | POA: Insufficient documentation

## 2013-12-18 DIAGNOSIS — Y9389 Activity, other specified: Secondary | ICD-10-CM | POA: Insufficient documentation

## 2013-12-18 DIAGNOSIS — Z7951 Long term (current) use of inhaled steroids: Secondary | ICD-10-CM | POA: Insufficient documentation

## 2013-12-18 DIAGNOSIS — Y998 Other external cause status: Secondary | ICD-10-CM | POA: Insufficient documentation

## 2013-12-18 DIAGNOSIS — I10 Essential (primary) hypertension: Secondary | ICD-10-CM | POA: Insufficient documentation

## 2013-12-18 DIAGNOSIS — W108XXA Fall (on) (from) other stairs and steps, initial encounter: Secondary | ICD-10-CM | POA: Diagnosis not present

## 2013-12-18 DIAGNOSIS — Z8639 Personal history of other endocrine, nutritional and metabolic disease: Secondary | ICD-10-CM | POA: Diagnosis not present

## 2013-12-18 DIAGNOSIS — S99912A Unspecified injury of left ankle, initial encounter: Secondary | ICD-10-CM | POA: Diagnosis present

## 2013-12-18 DIAGNOSIS — Z8669 Personal history of other diseases of the nervous system and sense organs: Secondary | ICD-10-CM | POA: Diagnosis not present

## 2013-12-18 MED ORDER — OXYCODONE-ACETAMINOPHEN 5-325 MG PO TABS
2.0000 | ORAL_TABLET | Freq: Once | ORAL | Status: AC
Start: 2013-12-19 — End: 2013-12-19
  Administered 2013-12-19: 2 via ORAL
  Filled 2013-12-18: qty 2

## 2013-12-18 NOTE — ED Provider Notes (Signed)
CSN: [id number]     Arrival date & time 12/18/13  2321 History  This chart was scribed for Steve Drape, MD by Martinique Peace, ED Scribe. The patient was seen in MH06/MH06. The patient's care was started at 11:55 PM.      Chief Complaint  Patient presents with  . Ankle Pain      Patient is a 28 y.o. male presenting with ankle pain. The history is provided by the patient. No language interpreter was used.  Ankle Pain   HPI Comments: Steve Jacobs is a 28 y.o. male who presents to the Emergency Department complaining of left ankle injury onset 11:00 tonight that occurred when pt was walking down the stairs, missed a step and inerted his left ankle. Pt adds that he heard his ankle crack 3 times upon incident. Pt notes pain is very severe and describes it as feelings of "waves" radiating down his leg and into his ankle. He reports that he is not able to ambulate on affected leg currently. No history of similar injuries. Pt is non-smoker.    Past Medical History  Diagnosis Date  . Asthma   . Meniere's disease   . Environmental and seasonal allergies   . Depression     Resolved  . Chicken pox   . Hyperlipidemia   . Essential hypertension, benign 08/03/2013   Past Surgical History  Procedure Laterality Date  . Nevus biopsy     Family History  Problem Relation Age of Onset  . Hyperlipidemia Father     Living  . Stroke Father   . Hypertension Father   . Alcohol abuse Mother     Living  . Diabetes Mellitus II Maternal Grandfather   . Hypertension Maternal Grandfather   . Heart failure Maternal Grandfather   . Kidney disease Maternal Grandfather   . Heart disease Maternal Grandmother   . Melanoma Paternal Grandmother   . Heart attack Paternal Grandfather   . Migraines Brother   . Healthy Brother     x2  . Drug abuse Sister     Died of Overdose   History  Substance Use Topics  . Smoking status: Never Smoker   . Smokeless tobacco: Never Used  . Alcohol Use: Yes     Comment:  2-3 beers nightly    Review of Systems  Musculoskeletal:       Left ankle pain.   All other systems reviewed and are negative.     Allergies  Review of patient's allergies indicates no known allergies.  Home Medications   Prior to Admission medications   Medication Sig Start Date End Date Taking? Authorizing Provider  albuterol (PROVENTIL HFA;VENTOLIN HFA) 108 (90 BASE) MCG/ACT inhaler Inhale 2 puffs into the lungs every 6 (six) hours as needed for wheezing. 03/22/13   Brunetta Jeans, PA-C  budesonide-formoterol (SYMBICORT) 80-4.5 MCG/ACT inhaler Inhale 2 puffs into the lungs 2 (two) times daily. 06/10/13   Kathee Delton, MD  cetirizine (ZYRTEC) 10 MG tablet Take 10 mg by mouth daily.    Historical Provider, MD  diazepam (VALIUM) 2 MG tablet TAKE 1 TABLET BY MOUTH EVERY 12 HOURS AS NEEDED FOR ANXIETY 12/01/13   Brunetta Jeans, PA-C  diphenhydrAMINE (BENADRYL) 25 MG tablet Take 25 mg by mouth every 6 (six) hours as needed for itching or allergies.    Historical Provider, MD  escitalopram (LEXAPRO) 10 MG tablet TAKE 1 TABLET BY MOUTH ONCE DAILY 11/08/13   Mosie Lukes, MD  ibuprofen (  ADVIL,MOTRIN) 200 MG tablet Take 400 mg by mouth at bedtime.    Historical Provider, MD  lisinopril (PRINIVIL,ZESTRIL) 10 MG tablet TAKE 1 TABLET BY MOUTH DAILY. 11/29/13   Brunetta Jeans, PA-C  montelukast (SINGULAIR) 10 MG tablet Take 1 tablet (10 mg total) by mouth at bedtime. 05/12/13   Brunetta Jeans, PA-C  Omega-3 Fatty Acids (FISH OIL) 1000 MG CAPS Take 1 capsule by mouth daily.    Historical Provider, MD  ondansetron (ZOFRAN) 8 MG tablet Take 1 tablet (8 mg total) by mouth every 8 (eight) hours as needed for nausea or vomiting. 03/22/13   Brunetta Jeans, PA-C   BP 149/94 mmHg  Pulse 119  Temp(Src) 98.1 F (36.7 C) (Oral)  Resp 20  Ht 5\' 10"  (1.778 m)  Wt 185 lb (83.915 kg)  BMI 26.54 kg/m2  SpO2 99% Physical Exam  Constitutional: He is oriented to person, place, and time. He appears  well-developed and well-nourished. He appears distressed.  HENT:  Head: Normocephalic and atraumatic.  Musculoskeletal: He exhibits edema and tenderness.  Decreased ROM of left ankle due to pain.  Soft tissue swelling noted to lateral malleolus.  2+ pulses, normal sensation  Neurological: He is alert and oriented to person, place, and time.  Skin: Skin is warm and dry. No rash noted. No erythema. No pallor.  Psychiatric: He has a normal mood and affect. His behavior is normal. Thought content normal.  Nursing note and vitals reviewed.   ED Course  Procedures (including critical care time) Labs Review Labs Reviewed - No data to display  Imaging Review Dg Ankle Complete Left  12/19/2013   CLINICAL DATA:  Fall down steps, twisted left ankle, swelling  EXAM: LEFT ANKLE COMPLETE - 3+ VIEW  COMPARISON:  None.  FINDINGS: No fracture or dislocation is seen.  The ankle mortise is intact.  The base of the fifth metatarsal is unremarkable.  Dorsal/lateral soft tissue swelling.  IMPRESSION: No fracture or dislocation is seen.  Dorsal/lateral soft tissue swelling.   Electronically Signed   By: Julian Hy M.D.   On: 12/19/2013 00:08     EKG Interpretation None     Medications - No data to display  12:00 AM- Treatment plan was discussed with patient who verbalizes understanding and agrees.   MDM   Final diagnoses:  Swelling  Left ankle sprain, initial encounter   28 yo male s/p fall with left ankle sprain.  Supportive care.  I personally performed the services described in this documentation, which was scribed in my presence. The recorded information has been reviewed and is accurate.    Steve Drape, MD 12/19/13 [phone]

## 2013-12-18 NOTE — ED Notes (Signed)
Pt reports was coming down 3 steps and missed the steps and went from top step to bottom twisting left ankle.  Pt has swelling to left ankle.

## 2013-12-19 MED ORDER — ONDANSETRON 4 MG PO TBDP
4.0000 mg | ORAL_TABLET | Freq: Once | ORAL | Status: AC
  Administered 2013-12-19: 4 mg via ORAL
  Filled 2013-12-19: qty 1

## 2013-12-19 MED ORDER — OXYCODONE-ACETAMINOPHEN 5-325 MG PO TABS: 1.0000 | ORAL_TABLET | ORAL | Status: DC | PRN

## 2013-12-19 MED ORDER — IBUPROFEN 800 MG PO TABS: 800.0000 mg | ORAL_TABLET | Freq: Three times a day (TID) | ORAL | Status: DC

## 2013-12-19 MED ORDER — ONDANSETRON 8 MG PO TBDP: 8.0000 mg | ORAL_TABLET | Freq: Three times a day (TID) | ORAL | Status: DC | PRN

## 2013-12-19 NOTE — Discharge Instructions (Signed)
Use ASO brace for comfort for 7-10 days.  Use crutches: no weightbearing for 2 days, then begin to bear weight using crutches for remainder of week.  Keep foot elevated and use ice as outlined below for first 2 days.  Take medications as prescribed.  Follow up with sports medicine as needed.   Ankle Sprain An ankle sprain is an injury to the strong, fibrous tissues (ligaments) that hold the bones of your ankle joint together.  CAUSES An ankle sprain is usually caused by a fall or by twisting your ankle. Ankle sprains most commonly occur when you step on the outer edge of your foot, and your ankle turns inward. People who participate in sports are more prone to these types of injuries.  SYMPTOMS   Pain in your ankle. The pain may be present at rest or only when you are trying to stand or walk.  Swelling.  Bruising. Bruising may develop immediately or within 1 to 2 days after your injury.  Difficulty standing or walking, particularly when turning corners or changing directions. DIAGNOSIS  Your caregiver will ask you details about your injury and perform a physical exam of your ankle to determine if you have an ankle sprain. During the physical exam, your caregiver will press on and apply pressure to specific areas of your foot and ankle. Your caregiver will try to move your ankle in certain ways. An X-ray exam may be done to be sure a bone was not broken or a ligament did not separate from one of the bones in your ankle (avulsion fracture).  TREATMENT  Certain types of braces can help stabilize your ankle. Your caregiver can make a recommendation for this. Your caregiver may recommend the use of medicine for pain. If your sprain is severe, your caregiver may refer you to a surgeon who helps to restore function to parts of your skeletal system (orthopedist) or a physical therapist. [place] ice to your injury for 1-2 days or as directed by your caregiver. Applying ice helps  to reduce inflammation and pain.  Put ice in a plastic bag.  Place a towel between your skin and the bag.  Leave the ice on for 15-20 minutes at a time, every 2 hours while you are awake.  Only take over-the-counter or prescription medicines for pain, discomfort, or fever as directed by your caregiver.  Elevate your injured ankle above the level of your heart as much as possible for 2-3 days.  If your caregiver recommends crutches, use them as instructed. Gradually put weight on the affected ankle. Continue to use crutches or a cane until you can walk without feeling pain in your ankle.  If you have a plaster splint, wear the splint as directed by your caregiver. Do not rest it on anything harder than a pillow for the first 24 hours. Do not put weight on it. Do not get it wet. You may take it off to take a shower or bath.  You may have been given an elastic bandage to wear around your ankle to provide support. If the elastic bandage is too tight (you have numbness or tingling in your foot or your foot becomes cold and blue), adjust the bandage to make it comfortable.  If you have an air splint, you may blow more air into it or let air out to make it more comfortable. You may take your splint off at night and before taking a shower or bath. Wiggle your toes  in the splint several times per day to decrease swelling. SEEK MEDICAL CARE IF:   You have rapidly increasing bruising or swelling.  Your toes feel extremely cold or you lose feeling in your foot.  Your pain is not relieved with medicine. SEEK IMMEDIATE MEDICAL CARE IF:  Your toes are numb or blue.  You have severe pain that is increasing. MAKE SURE YOU:   Understand these instructions.  Will watch your condition.  Will get help right away if you are not doing well or get worse. Document Released: 12/31/2004 Document Revised: 09/25/2011 Document Reviewed: 01/12/2011 [facility] Patient Information 2015 [place], [place]. This  information is not intended to replace advice given to you by your health care provider. Make sure you discuss any questions you have with your health care provider.   Ankle Exercises for Rehabilitation Following ankle injuries, it is as important to follow your caregiver's instructions for regaining full use of your ankle as it was to follow the initial treatment plan following the injury. The following are some suggestions for exercises and treatment, which can be done to help you regain full use of your ankle as soon as possible.  Follow all instructions regarding physical therapy.  Before exercising, it may be helpful to use heat on the muscles or joint being exercised. This loosens up the muscles and tendons (cordlike structure) and decreases chances of injury during your exercises. If this is not possible, just begin your exercises slowly to gradually warm up.  Stand on your toes several times per day to strengthen the calf muscles. These are the muscles in the back of your leg between the knee and the heel. The cord you can feel just above the heel is the Achilles tendon. Rise up on your toes several times repeating this three to four times per day. Do not exercise to the point of pain. If pain starts to develop, decrease the exercise until you are comfortable again.  Do range of motion exercises. This means moving the ankle in all directions. Practice writing the alphabet with your toes in the air. Do not increase beyond a range that is comfortable.  Increase the strength of the muscles in the front of your leg by raising your toes and foot straight up in the air. Repeat this exercise as you did the calf exercise with the same warnings. This also help to stretch your muscles.  Stretch your calf muscles also by leaning against a wall with your hands in front of you. Put your feet a few feet from the wall and bend your knees until you feel the muscles in your calves become tight.  After  exercising it may be helpful to put ice on the ankle to prevent swelling and improve rehabilitation. This may be done for 15 to 20 minutes following your exercises. If exercising is being done in the workplace, this may not always be possible.  Taping an ankle injury may be helpful to give added support following an injury. It also may help prevent reinjury. This may be true if you are in training or in a conditioning program. You and your caregiver can decide on the best course of action to follow. Document Released: 12/29/1999 Document Revised: 05/17/2013 Document Reviewed: 12/26/2007 [facility] Patient Information 2015 [place], [place]. This information is not intended to replace advice given to you by your health care provider. Make sure you discuss any questions you have with your health care provider.  Cryotherapy Cryotherapy means treatment with cold. Ice or  gel packs can be used to reduce both pain and swelling. Ice is the most helpful within the first 24 to 48 hours after an injury or flare-up from overusing a muscle or joint. Sprains, strains, spasms, burning pain, shooting pain, and aches can all be eased with ice. Ice can also be used when recovering from surgery. Ice is effective, has very few side effects, and is safe for most people to use. PRECAUTIONS  Ice is not a safe treatment option for people with:  Raynaud phenomenon. This is a condition affecting Deuser blood vessels in the extremities. Exposure to cold may cause your problems to return.  Cold hypersensitivity. There are many forms of cold hypersensitivity, including:  Cold urticaria. Red, itchy hives appear on the skin when the tissues begin to warm after being iced.  Cold erythema. This is a red, itchy rash caused by exposure to cold.  Cold hemoglobinuria. Red blood cells break down when the tissues begin to warm after being iced. The hemoglobin that carry oxygen are passed into the urine because they cannot combine with blood  proteins fast enough.  Numbness or altered sensitivity in the area being iced. If you have any of the following conditions, do not use ice until you have discussed cryotherapy with your caregiver:  Heart conditions, such as arrhythmia, angina, or chronic heart disease.  High blood pressure.  Healing wounds or open skin in the area being iced.  Current infections.  Rheumatoid arthritis.  Poor circulation.  Diabetes. Ice slows the blood flow in the region it is applied. This is beneficial when trying to stop inflamed tissues from spreading irritating chemicals to surrounding tissues. However, if you expose your skin to cold temperatures for too long or without the proper protection, you can damage your skin or nerves. Watch for signs of skin damage due to cold. HOME CARE INSTRUCTIONS Follow these tips to use ice and cold packs safely.  Place a dry or damp towel between the ice and skin. A damp towel will cool the skin more quickly, so you may need to shorten the time that the ice is used.  For a more rapid response, add gentle compression to the ice.  Ice for no more than 10 to 20 minutes at a time. The bonier the area you are icing, the less time it will take to get the benefits of ice.  Check your skin after 5 minutes to make sure there are no signs of a poor response to cold or skin damage.  Rest 20 minutes or more between uses.  Once your skin is numb, you can end your treatment. You can test numbness by very lightly touching your skin. The touch should be so light that you do not see the skin dimple from the pressure of your fingertip. When using ice, most people will feel these normal sensations in this order: cold, burning, aching, and numbness.  Do not use ice on someone who cannot communicate their responses to pain, such as [place] children or people with dementia. HOW TO MAKE AN ICE PACK Ice packs are the most common way to use ice therapy. Other methods include ice  massage, ice baths, and cryosprays. Muscle creams that cause a cold, tingly feeling do not offer the same benefits that ice offers and should not be used as a substitute unless recommended by your caregiver. To make an ice pack, do one of the following:  Place crushed ice or a bag of frozen vegetables in a sealable  plastic bag. Squeeze out the excess air. Place this bag inside another plastic bag. Slide the bag into a pillowcase or place a damp towel between your skin and the bag.  Mix 3 parts water with 1 part rubbing alcohol. Freeze the mixture in a sealable plastic bag. When you remove the mixture from the freezer, it will be slushy. Squeeze out the excess air. Place this bag inside another plastic bag. Slide the bag into a pillowcase or place a damp towel between your skin and the bag. SEEK MEDICAL CARE IF:  You develop white spots on your skin. This may give the skin a blotchy (mottled) appearance.  Your skin turns blue or pale.  Your skin becomes waxy or hard.  Your swelling gets worse. MAKE SURE YOU:   Understand these instructions.  Will watch your condition.  Will get help right away if you are not doing well or get worse. Document Released: 08/27/2010 Document Revised: 05/17/2013 Document Reviewed: 08/27/2010 [facility] Patient Information 2015 [place], [place]. This information is not intended to replace advice given to you by your health care provider. Make sure you discuss any questions you have with your health care provider.

## 2013-12-19 NOTE — ED Notes (Signed)
Pt states he feels a little nauseated. MD aware and orders received.

## 2013-12-22 ENCOUNTER — Encounter: Payer: Self-pay | Admitting: Family Medicine

## 2013-12-22 ENCOUNTER — Ambulatory Visit (INDEPENDENT_AMBULATORY_CARE_PROVIDER_SITE_OTHER): Payer: 59 | Admitting: Family Medicine

## 2013-12-22 VITALS — BP 141/90 | HR 101 | Ht 70.0 in | Wt 185.0 lb

## 2013-12-22 DIAGNOSIS — S93402A Sprain of unspecified ligament of left ankle, initial encounter: Secondary | ICD-10-CM

## 2013-12-22 MED ORDER — OXYCODONE-ACETAMINOPHEN 5-325 MG PO TABS: 1.0000 | ORAL_TABLET | Freq: Four times a day (QID) | ORAL | Status: DC | PRN

## 2013-12-22 NOTE — Patient Instructions (Signed)
You have an ankle sprain. Ice the area for 15 minutes at a time, 3-4 times a day Aleve 2 tabs twice a day with food OR ibuprofen 3 tabs three times a day with food for pain and inflammation. Percocet as needed for severe pain. Elevate above the level of your heart when possible Walker if needed to help with walking Bear weight when tolerated Use laceup ankle brace to help with stability while you recover from this injury. Come out of the boot/brace twice a day to do Up/down and alphabet exercises 2-3 sets of each. Consider physical therapy for strengthening and balance exercises in the future. Out of work the next couple weeks. If not improving as expected, we may repeat x-rays or consider further testing like an MRI. Follow up with me in 2 weeks.

## 2013-12-23 ENCOUNTER — Encounter: Payer: Self-pay | Admitting: Family Medicine

## 2013-12-23 DIAGNOSIS — S93402A Sprain of unspecified ligament of left ankle, initial encounter: Secondary | ICD-10-CM | POA: Insufficient documentation

## 2013-12-23 NOTE — Progress Notes (Signed)
PCP: Leeanne Rio, PA-C  Subjective:   HPI: Patient is a 28 y.o. male here for left ankle injury.  Patient [place] he [place] down from the top step about 3 feet and severely inverted left ankle. Heard at least 3 pops and couldn't bear weight after this. No prior injuries. Radiographs negative in ED. Has been icing, taking ibuprofen and percocet. Using ASO and a walker with minimal weight bearing.  Past Medical History  Diagnosis Date  . Asthma   . Meniere's disease   . Environmental and seasonal allergies   . Depression     Resolved  . Chicken pox   . Hyperlipidemia   . Essential hypertension, benign 08/03/2013  . Thoracic outlet syndrome     Current Outpatient Prescriptions on File Prior to Visit  Medication Sig Dispense Refill  . albuterol (PROVENTIL HFA;VENTOLIN HFA) 108 (90 BASE) MCG/ACT inhaler Inhale 2 puffs into the lungs every 6 (six) hours as needed for wheezing. 6.7 g 2  . budesonide-formoterol (SYMBICORT) 80-4.5 MCG/ACT inhaler Inhale 2 puffs into the lungs 2 (two) times daily. 1 Inhaler 6  . cetirizine (ZYRTEC) 10 MG tablet Take 10 mg by mouth daily.    . diazepam (VALIUM) 2 MG tablet TAKE 1 TABLET BY MOUTH EVERY 12 HOURS AS NEEDED FOR ANXIETY 60 tablet PRN  . diphenhydrAMINE (BENADRYL) 25 MG tablet Take 25 mg by mouth every 6 (six) hours as needed for itching or allergies.    Marland Kitchen escitalopram (LEXAPRO) 10 MG tablet TAKE 1 TABLET BY MOUTH ONCE DAILY 30 tablet 4  . ibuprofen (ADVIL,MOTRIN) 800 MG tablet Take 1 tablet (800 mg total) by mouth 3 (three) times daily. 21 tablet 0  . lisinopril (PRINIVIL,ZESTRIL) 10 MG tablet TAKE 1 TABLET BY MOUTH DAILY. 30 tablet 2  . montelukast (SINGULAIR) 10 MG tablet Take 1 tablet (10 mg total) by mouth at bedtime. 90 tablet 1  . Omega-3 Fatty Acids (FISH OIL) 1000 MG CAPS Take 1 capsule by mouth daily.    . ondansetron (ZOFRAN ODT) 8 MG disintegrating tablet Take 1 tablet (8 mg total) by mouth every 8  (eight) hours as needed for nausea or vomiting. 20 tablet 0  . ondansetron (ZOFRAN) 8 MG tablet Take 1 tablet (8 mg total) by mouth every 8 (eight) hours as needed for nausea or vomiting. 20 tablet 0   No current facility-administered medications on file prior to visit.    Past Surgical History  Procedure Laterality Date  . Nevus biopsy      No Known Allergies  History   Social History  . Marital Status: Single    Spouse Name: N/A    Number of Children: N/A  . Years of Education: N/A   Occupational History  . Not on file.   Social History Main Topics  . Smoking status: Never Smoker   . Smokeless tobacco: Never Used  . Alcohol Use: 0.0 oz/week    0 Not specified per week     Comment: 2-3 beers nightly  . Drug Use: No  . Sexual Activity: Yes    Birth Control/ Protection: None     Comment: male - 1 partner   Other Topics Concern  . Not on file   Social History Narrative    Family History  Problem Relation Age of Onset  . Hyperlipidemia Father     Living  . Stroke Father   . Hypertension Father   . Alcohol abuse Mother  Living  . Diabetes Mellitus II Maternal Grandfather   . Hypertension Maternal Grandfather   . Heart failure Maternal Grandfather   . Kidney disease Maternal Grandfather   . Heart disease Maternal Grandmother   . Melanoma Paternal Grandmother   . Heart attack Paternal Grandfather   . Migraines Brother   . Healthy Brother     x2  . Drug abuse Sister     Died of Overdose    BP 141/90 mmHg  Pulse 101  Ht 5\' 10"  (1.778 m)  Wt 185 lb (83.915 kg)  BMI 26.54 kg/m2  Review of Systems: See HPI above.    Objective:  Physical Exam:  Gen: NAD  Left ankle: Mod swelling, mild bruising laterally.  No other deformity. Mod limitation ROM all directions. TTP greatest over ATFL, ant ankle joint, lateral malleolus.  No base 5th, medial malleolus, navicular, other tenderness. Painful 1+ ant drawer, 2+ talar tilt. Negative syndesmotic  compression. Thompsons test negative. NV intact distally.    Assessment & Plan:  1. Left ankle sprain - radiographs negative.  Grade 3.  Icing, nsaids with percocet as needed.  Start ROM exercises.  ASO for support.  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  F/u in 2 weeks.  Consider physical therapy, adding strengthening exercises at that time.

## 2013-12-23 NOTE — Assessment & Plan Note (Signed)
radiographs negative.  Grade 3.  Icing, nsaids with percocet as needed.  Start ROM exercises.  ASO for support.  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  F/u in 2 weeks.  Consider physical therapy, adding strengthening exercises at that time.

## 2014-01-04 ENCOUNTER — Ambulatory Visit: Payer: 59 | Admitting: Family Medicine

## 2014-01-13 ENCOUNTER — Ambulatory Visit (INDEPENDENT_AMBULATORY_CARE_PROVIDER_SITE_OTHER): Payer: 59 | Admitting: Family Medicine

## 2014-01-13 ENCOUNTER — Encounter: Payer: Self-pay | Admitting: Family Medicine

## 2014-01-13 VITALS — BP 131/91 | HR 87 | Ht 70.0 in | Wt 190.0 lb

## 2014-01-13 DIAGNOSIS — S93402D Sprain of unspecified ligament of left ankle, subsequent encounter: Secondary | ICD-10-CM

## 2014-01-13 NOTE — Patient Instructions (Signed)
Continue with home exercises. Add the strengthening exercises with theraband and the balance exercises I showed you. 3 sets of 10 of each exercise once a day. Start physical therapy as well - do the exercises they show you on days you don't go to therapy. Out of work. Follow up with me in 3 weeks. Wear laceup ankle brace regularly when up and walking around. Ibuprofen as needed.

## 2014-01-17 NOTE — Assessment & Plan Note (Signed)
radiographs negative.  Grade 3.  Much improved compared to last visit.  Continue ASO.  Add physical therapy and home exercises.  Icing, nsaids.  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

## 2014-01-17 NOTE — Progress Notes (Signed)
PCP: Leeanne Rio, PA-C  Subjective:   HPI: Patient is a 29 y.o. male here for left ankle injury.  12/9: Patient [place] from the top step about 3 feet and severely inverted left ankle. Heard at least 3 pops and couldn't bear weight after this. No prior injuries. Radiographs negative in ED. Has been icing, taking ibuprofen and percocet. Using ASO and a walker with minimal weight bearing.  12/31: Patient reports he feels better compared to last visit. Using ASO. Doing ROM exercises. Taking ibuprofen. Still walking with a mild limp. No longer using crutches.  Past Medical History  Diagnosis Date  . Asthma   . Meniere's disease   . Environmental and seasonal allergies   . Depression     Resolved  . Chicken pox   . Hyperlipidemia   . Essential hypertension, benign 08/03/2013  . Thoracic outlet syndrome     Current Outpatient Prescriptions on File Prior to Visit  Medication Sig Dispense Refill  . albuterol (PROVENTIL HFA;VENTOLIN HFA) 108 (90 BASE) MCG/ACT inhaler Inhale 2 puffs into the lungs every 6 (six) hours as needed for wheezing. 6.7 g 2  . budesonide-formoterol (SYMBICORT) 80-4.5 MCG/ACT inhaler Inhale 2 puffs into the lungs 2 (two) times daily. 1 Inhaler 6  . cetirizine (ZYRTEC) 10 MG tablet Take 10 mg by mouth daily.    . diazepam (VALIUM) 2 MG tablet TAKE 1 TABLET BY MOUTH EVERY 12 HOURS AS NEEDED FOR ANXIETY 60 tablet PRN  . diphenhydrAMINE (BENADRYL) 25 MG tablet Take 25 mg by mouth every 6 (six) hours as needed for itching or allergies.    Marland Kitchen escitalopram (LEXAPRO) 10 MG tablet TAKE 1 TABLET BY MOUTH ONCE DAILY 30 tablet 4  . ibuprofen (ADVIL,MOTRIN) 800 MG tablet Take 1 tablet (800 mg total) by mouth 3 (three) times daily. 21 tablet 0  . lisinopril (PRINIVIL,ZESTRIL) 10 MG tablet TAKE 1 TABLET BY MOUTH DAILY. 30 tablet 2  . montelukast (SINGULAIR) 10 MG tablet Take 1 tablet (10 mg total) by mouth at bedtime. 90  tablet 1  . Omega-3 Fatty Acids (FISH OIL) 1000 MG CAPS Take 1 capsule by mouth daily.    . ondansetron (ZOFRAN ODT) 8 MG disintegrating tablet Take 1 tablet (8 mg total) by mouth every 8 (eight) hours as needed for nausea or vomiting. 20 tablet 0  . ondansetron (ZOFRAN) 8 MG tablet Take 1 tablet (8 mg total) by mouth every 8 (eight) hours as needed for nausea or vomiting. 20 tablet 0  . oxyCODONE-acetaminophen (PERCOCET/ROXICET) 5-325 MG per tablet Take 1-2 tablets by mouth every 6 (six) hours as needed for severe pain. 60 tablet 0   No current facility-administered medications on file prior to visit.    Past Surgical History  Procedure Laterality Date  . Nevus biopsy      No Known Allergies  History   Social History  . Marital Status: Single    Spouse Name: N/A    Number of Children: N/A  . Years of Education: N/A   Occupational History  . Not on file.   Social History Main Topics  . Smoking status: Never Smoker   . Smokeless tobacco: Never Used  . Alcohol Use: 0.0 oz/week    0 Not specified per week     Comment: 2-3 beers nightly  . Drug Use: No  . Sexual Activity: Yes    Birth Control/ Protection: None     Comment: male -  1 partner   Other Topics Concern  . Not on file   Social History Narrative    Family History  Problem Relation Age of Onset  . Hyperlipidemia Father     Living  . Stroke Father   . Hypertension Father   . Alcohol abuse Mother     Living  . Diabetes Mellitus II Maternal Grandfather   . Hypertension Maternal Grandfather   . Heart failure Maternal Grandfather   . Kidney disease Maternal Grandfather   . Heart disease Maternal Grandmother   . Melanoma Paternal Grandmother   . Heart attack Paternal Grandfather   . Migraines Brother   . Healthy Brother     x2  . Drug abuse Sister     Died of Overdose    BP 131/91 mmHg  Pulse 87  Ht 5\' 10"  (1.778 m)  Wt 190 lb (86.183 kg)  BMI 27.26 kg/m2  Review of Systems: See HPI above.     Objective:  Physical Exam:  Gen: NAD  Left ankle: Minimal swelling, bruising laterally. No other deformity. Mild limitation all directions. TTP greatest over ATFL, ant ankle joint.  No lateral malleolus, base 5th, medial malleolus, navicular, other tenderness. Painful 1+ ant drawer, 2+ talar tilt. Negative syndesmotic compression. Thompsons test negative. NV intact distally.    Assessment & Plan:  1. Left ankle sprain - radiographs negative.  Grade 3.  Much improved compared to last visit.  Continue ASO.  Add physical therapy and home exercises.  Icing, nsaids.  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

## 2014-01-24 ENCOUNTER — Telehealth: Payer: Self-pay | Admitting: Family Medicine

## 2014-01-24 NOTE — Telephone Encounter (Signed)
This is ok - I'll need to see him to extend it any further beyond that if necessary.  Thanks!

## 2014-01-24 NOTE — Addendum Note (Signed)
Addended by: Sherrie George F on: 01/24/2014 08:21 AM   Modules accepted: Orders

## 2014-02-03 ENCOUNTER — Ambulatory Visit (INDEPENDENT_AMBULATORY_CARE_PROVIDER_SITE_OTHER): Payer: 59 | Admitting: Family Medicine

## 2014-02-03 ENCOUNTER — Encounter: Payer: Self-pay | Admitting: Family Medicine

## 2014-02-03 VITALS — BP 149/106 | HR 111 | Ht 69.0 in | Wt 185.0 lb

## 2014-02-03 DIAGNOSIS — S93402D Sprain of unspecified ligament of left ankle, subsequent encounter: Secondary | ICD-10-CM

## 2014-02-03 NOTE — Patient Instructions (Signed)
Continue with home exercises once a day. Start physical therapy as well - do the exercises they show you on days you don't go to therapy. Light duty if available starting 1/30. Follow up with me in about 5 weeks. Wear laceup ankle brace regularly when up and walking around. Ibuprofen as needed.

## 2014-02-07 ENCOUNTER — Ambulatory Visit: Payer: 59 | Attending: Family Medicine

## 2014-02-07 NOTE — Assessment & Plan Note (Signed)
radiographs negative.  Grade 3.  Clinically about the same compared to last visit but he has not started physical therapy yet - think this is very important for his full recovery and to get back to full duty.  Light duty note written starting when current out of work note expires.  ASO, PT, HEP.  F/u in 5 weeks.  NSAIDs if needed.

## 2014-02-07 NOTE — Progress Notes (Signed)
PCP: Leeanne Rio, PA-C  Subjective:   HPI: Patient is a 29 y.o. male here for left ankle injury.  12/9: Patient [place] he [place] down from the top step about 3 feet and severely inverted left ankle. Heard at least 3 pops and couldn't bear weight after this. No prior injuries. Radiographs negative in ED. Has been icing, taking ibuprofen and percocet. Using ASO and a walker with minimal weight bearing.  12/31: Patient reports he feels better compared to last visit. Using ASO. Doing ROM exercises. Taking ibuprofen. Still walking with a mild limp. No longer using crutches.  02/03/14: Patient reports his ankle feels less stiff but doing exercises makes pain worsen. He did report however he's been doing exercises 3 times a day instead of once. Has not started physical therapy yet - first appointment 1/25. Anxious to get started in rehab. Taking ibuprofen 800mg  three times a day. Some swelling outside of foot.   Past Medical History  Diagnosis Date  . Asthma   . Meniere's disease   . Environmental and seasonal allergies   . Depression     Resolved  . Chicken pox   . Hyperlipidemia   . Essential hypertension, benign 08/03/2013  . Thoracic outlet syndrome     Current Outpatient Prescriptions on File Prior to Visit  Medication Sig Dispense Refill  . albuterol (PROVENTIL HFA;VENTOLIN HFA) 108 (90 BASE) MCG/ACT inhaler Inhale 2 puffs into the lungs every 6 (six) hours as needed for wheezing. 6.7 g 2  . budesonide-formoterol (SYMBICORT) 80-4.5 MCG/ACT inhaler Inhale 2 puffs into the lungs 2 (two) times daily. 1 Inhaler 6  . cetirizine (ZYRTEC) 10 MG tablet Take 10 mg by mouth daily.    . diazepam (VALIUM) 2 MG tablet TAKE 1 TABLET BY MOUTH EVERY 12 HOURS AS NEEDED FOR ANXIETY 60 tablet PRN  . diphenhydrAMINE (BENADRYL) 25 MG tablet Take 25 mg by mouth every 6 (six) hours as needed for itching or allergies.    Marland Kitchen escitalopram (LEXAPRO) 10 MG  tablet TAKE 1 TABLET BY MOUTH ONCE DAILY 30 tablet 4  . ibuprofen (ADVIL,MOTRIN) 800 MG tablet Take 1 tablet (800 mg total) by mouth 3 (three) times daily. 21 tablet 0  . lisinopril (PRINIVIL,ZESTRIL) 10 MG tablet TAKE 1 TABLET BY MOUTH DAILY. 30 tablet 2  . montelukast (SINGULAIR) 10 MG tablet Take 1 tablet (10 mg total) by mouth at bedtime. 90 tablet 1  . Omega-3 Fatty Acids (FISH OIL) 1000 MG CAPS Take 1 capsule by mouth daily.    . ondansetron (ZOFRAN ODT) 8 MG disintegrating tablet Take 1 tablet (8 mg total) by mouth every 8 (eight) hours as needed for nausea or vomiting. 20 tablet 0  . ondansetron (ZOFRAN) 8 MG tablet Take 1 tablet (8 mg total) by mouth every 8 (eight) hours as needed for nausea or vomiting. 20 tablet 0  . oxyCODONE-acetaminophen (PERCOCET/ROXICET) 5-325 MG per tablet Take 1-2 tablets by mouth every 6 (six) hours as needed for severe pain. 60 tablet 0   No current facility-administered medications on file prior to visit.    Past Surgical History  Procedure Laterality Date  . Nevus biopsy      No Known Allergies  History   Social History  . Marital Status: Single    Spouse Name: N/A    Number of Children: N/A  . Years of Education: N/A   Occupational History  . Not on file.   Social History Main Topics  .  Smoking status: Never Smoker   . Smokeless tobacco: Never Used  . Alcohol Use: 0.0 oz/week    0 Not specified per week     Comment: 2-3 beers nightly  . Drug Use: No  . Sexual Activity: Yes    Birth Control/ Protection: None     Comment: male - 1 partner   Other Topics Concern  . Not on file   Social History Narrative    Family History  Problem Relation Age of Onset  . Hyperlipidemia Father     Living  . Stroke Father   . Hypertension Father   . Alcohol abuse Mother     Living  . Diabetes Mellitus II Maternal Grandfather   . Hypertension Maternal Grandfather   . Heart failure Maternal Grandfather   . Kidney disease Maternal Grandfather    . Heart disease Maternal Grandmother   . Melanoma Paternal Grandmother   . Heart attack Paternal Grandfather   . Migraines Brother   . Healthy Brother     x2  . Drug abuse Sister     Died of Overdose    BP 149/106 mmHg  Pulse 111  Ht 5\' 9"  (1.753 m)  Wt 185 lb (83.915 kg)  BMI 27.31 kg/m2  Review of Systems: See HPI above.    Objective:  Physical Exam:  Gen: NAD  Left ankle: Minimal swelling, no bruising laterally. No other deformity. FROM TTP greatest over ATFL, ant ankle joint.  No lateral malleolus, base 5th, medial malleolus, navicular, other tenderness. Painful 1+ ant drawer, 2+ talar tilt. Negative syndesmotic compression. Thompsons test negative. NV intact distally.    Assessment & Plan:  1. Left ankle sprain - radiographs negative.  Grade 3.  Clinically about the same compared to last visit but he has not started physical therapy yet - think this is very important for his full recovery and to get back to full duty.  Light duty note written starting when current out of work note expires.  ASO, PT, HEP.  F/u in 5 weeks.  NSAIDs if needed.

## 2014-02-14 ENCOUNTER — Other Ambulatory Visit: Payer: Self-pay | Admitting: Physician Assistant

## 2014-02-14 NOTE — Telephone Encounter (Signed)
Medication Detail      Disp Refills Start End     lisinopril (PRINIVIL,ZESTRIL) 10 MG tablet 30 tablet 2 11/29/2013     Sig: TAKE 1 TABLET BY MOUTH DAILY.    E-Prescribing Status: Receipt confirmed by pharmacy (11/29/2013 8:33 AM EST)     Pharmacy    [facility] [place], [place].   Rx request to pharmacy; fill on or after 02/23/14/SLS

## 2014-02-16 ENCOUNTER — Ambulatory Visit: Payer: 59 | Admitting: Physical Therapy

## 2014-02-22 ENCOUNTER — Encounter: Payer: Self-pay | Admitting: Physical Therapy

## 2014-02-22 ENCOUNTER — Ambulatory Visit: Payer: 59 | Attending: Family Medicine | Admitting: Physical Therapy

## 2014-02-22 DIAGNOSIS — S93402A Sprain of unspecified ligament of left ankle, initial encounter: Secondary | ICD-10-CM | POA: Diagnosis not present

## 2014-02-22 DIAGNOSIS — X58XXXA Exposure to other specified factors, initial encounter: Secondary | ICD-10-CM | POA: Diagnosis not present

## 2014-02-22 NOTE — Telephone Encounter (Signed)
appts made and printed...td 

## 2014-02-22 NOTE — Patient Instructions (Signed)
Patient plans to do custom orthotics eventually, suggested off the shelf orthotic (SuperFeet) for arch support since swelling still present. Discussed current HEP recommended by MD red band ex; SLS; Gastroc/soleus stretch seated and standing 20 sec hold 3x/day 3x

## 2014-02-22 NOTE — Therapy (Signed)
[place], [place], [address] Phone: [phone]   Fax:  [phone]  Physical Therapy Evaluation  Patient Details  Name: Steve Jacobs MRN: [id number] Date of Birth: [birth year] Referring Provider:  Dene Gentry, MD  Encounter Date: 02/22/2014      PT End of Session - 02/22/14 1840    Visit Number 1   Number of Visits 16   Date for PT Re-Evaluation 04/19/14   PT Start Time 0800   PT Stop Time 0846   PT Time Calculation (min) 46 min   Activity Tolerance Patient tolerated treatment well      Past Medical History  Diagnosis Date  . Asthma   . Meniere's disease   . Environmental and seasonal allergies   . Depression     Resolved  . Chicken pox   . Hyperlipidemia   . Essential hypertension, benign 08/03/2013  . Thoracic outlet syndrome     Past Surgical History  Procedure Laterality Date  . Nevus biopsy      There were no vitals taken for this visit.  Visit Diagnosis:  Left ankle sprain, initial encounter - Plan: PT plan of care cert/re-cert      Subjective Assessment - 02/22/14 0806    Symptoms Walking down steep slate steps and fell down several steps 12/19/14 resulting in left ankle sprain.    Initially used a lace up brace but will get stiff if wearing too long.  Feels like it might roll, avoids walking on grass.  New left medial knee pain Difficulty getting in/out of the car.    No previous strains.  Just went back to work light duty March 12.    Works as a Engineer, maintenance (IT).       How long can you stand comfortably? Not an issue   How long can you walk comfortably? 100 yards   Diagnostic tests X-rays   Patient Stated Goals peace of mind;  back to work full duty; doing the [employer] and playing with godchildren   Currently in Pain? Yes   Pain Score 0-No pain  end of day 3/10;  grass, car 8/10   Pain Location Ankle   Pain Orientation Left   Pain Type Acute pain   Pain Onset  More than a month ago   Aggravating Factors  walking on grass, getting in/out of the car   Pain Relieving Factors sit in recliner, ibuprofen, ice          [facility] PT Assessment - 02/22/14 0819    Assessment   Medical Diagnosis left ankle sprain   Onset Date 12/18/13   Next MD Visit --  2/11   Prior Therapy --  home red band, single leg balance   Precautions   Precautions --  light duty at work   Required Braces or Orthoses Other Brace/Splint   Restrictions   Weight Bearing Restrictions No   Balance Screen   Has the patient fallen in the past 6 months Yes   Has the patient had a decrease in activity level because of a fear of falling?  Yes   Is the patient reluctant to leave their home because of a fear of falling?  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   Leisure jog, go to the gym   Observation/Other Assessments   Observations --  Fig 8 girth:  right 56cm,left 58;17cm above lat mall:1cm dif   Other Surveys  --  Do LEFS next visit   AROM   Right Ankle Dorsiflexion 12   Right Ankle Plantar Flexion 68   Right Ankle Inversion 28   Right Ankle Eversion 14   Left Ankle Dorsiflexion 0   Left Ankle Plantar Flexion 43   Left Ankle Inversion 20   Left Ankle Eversion 18   Strength   Right Ankle Dorsiflexion 5/5   Right Ankle Plantar Flexion 5/5   Right Ankle Inversion 5/5   Right Ankle Eversion 5/5   Left Ankle Dorsiflexion 4-/5   Left Ankle Plantar Flexion 4-/5   Left Ankle Inversion 4-/5   Left Ankle Eversion 4-/5   Palpation   Palpation --  Tender lateral left ankle   Special Tests    Special Tests Ankle/Foot Special Tests;Foot Alignment   Ankle/Foot Special Tests  --  Single leg standing 10 sec left   Foot Alignment --  bilateral pes  planus                          PT Education - 02/22/14 1839    Education provided Yes   Education Details Gastroc/soleus stretch   Person(s) Educated Patient   Methods Explanation;Demonstration   Comprehension Verbalized understanding;Returned demonstration          PT Short Term Goals - 02/22/14 1851    PT SHORT TERM GOAL #1   Title Independent in initial HEP for intermediate level ankle strengthening and ROM   Time 4   Period Weeks   Status New   PT SHORT TERM GOAL #2   Title The patient will have improved ankle DF to 5 degrees needed for greater ease getting in/out of the car with less pain   Time 4   Period Weeks   Status New   PT SHORT TERM GOAL #3   Title The patient will have improved proprioception to withstand moderate challenges with soft surfaces and change of direction to 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   Time 8   Period Weeks   Status New   PT LONG TERM GOAL #3   Title Left ankle DF improved to 8 degrees needed for descending steps/curbs with ease for return to work full duty   Time 8   Period Weeks   Status New   PT LONG TERM GOAL #4   Title Patient will report an overall improvement in pain and function >50%.   Time 8   Period Weeks   Status New   PT LONG TERM GOAL #5   Title Fig 8 circumferential measurement within 1 cm difference between right/left to facilitate healing of ligaments.   Time 8  Period Weeks   Status New               Plan - 02/22/14 1840    Clinical Impression Statement The patient is a pleasant 29  year old who sprained his left ankle when he fell down slate steps on 12/19/14.  He reports x-rays are negative for fracture but the doctor "said the ligament was completely torn."  Initially he was on crutches and wore an ankle support. Now he ambulates without crutches and only wears his ankle brace sometimes because it is uncomfortable.   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  He is concerned about return to full duty.  He is unable to do his previous gym program and states he has gained a lot of weight since his injury.  Decreased left ankle AROM:  DF 0, PF 43, Inv 20, Ev 18.  Strength ankle grossly 4-/5.  Able to SLS 10 sec on left. Bilateral pes planus. Circumferential measures left > 2 cm  diff in Fig 8,  left >1 cm difference mid calf.  Patient would benefit from progressive ROM, strength and propriceptive exercise and interventions to decrease pain/swelling.    Pt will benefit from skilled therapeutic intervention in order to improve on the following deficits Abnormal gait;Decreased range of motion;Pain;Decreased strength   Rehab Potential Good   PT Frequency 2x / week   PT Duration 8 weeks   PT Treatment/Interventions Cryotherapy;Electrical Stimulation;Therapeutic exercise;Neuromuscular re-education;Patient/family education;Manual techniques   PT Next Visit Plan Left ankle sprain 2 months ago; Do LEFS; Review gastroc/soleus stretch HEP; Ankle proprioceptive exercises in standing;  Nu-Step/Bike; BAPs; vasocompression or cold pack         Problem List Patient Active Problem List   Diagnosis Date Noted  . Left ankle sprain 12/23/2013  . Elevated triglycerides with high cholesterol 09/06/2013  . Acute bronchitis 08/03/2013  . Anxiety state, unspecified 08/03/2013  . Essential hypertension, benign 08/03/2013  . Environmental allergies 03/23/2013  . Visit for preventive health examination 03/23/2013  . Meniere disease 03/23/2013  . Palpitations  03/23/2013  . Intrinsic asthma   . SOB (shortness of breath)     Alvera Singh 02/22/2014, 7:01 PM  [place], [place], [address] Phone: [phone]   Fax:  [phone]  Ruben Im, [place] 02/22/2014 7:02 PM Phone: [phone] Fax: [phone]

## 2014-03-01 ENCOUNTER — Encounter: Payer: Self-pay | Admitting: Physician Assistant

## 2014-03-01 DIAGNOSIS — Z1283 Encounter for screening for malignant neoplasm of skin: Secondary | ICD-10-CM

## 2014-03-02 ENCOUNTER — Encounter: Payer: Self-pay | Admitting: Physician Assistant

## 2014-03-02 ENCOUNTER — Ambulatory Visit: Payer: 59 | Admitting: Physical Therapy

## 2014-03-04 ENCOUNTER — Ambulatory Visit: Payer: 59 | Admitting: Physical Therapy

## 2014-03-04 DIAGNOSIS — S93402A Sprain of unspecified ligament of left ankle, initial encounter: Secondary | ICD-10-CM | POA: Diagnosis not present

## 2014-03-04 NOTE — Therapy (Signed)
[place], [place], [address] Phone: [phone]   Fax:  [phone]  Physical Therapy Treatment  Patient Details  Name: Steve Jacobs MRN: [id number] Date of Birth: 1985-07-10 Referring Provider:  Brunetta Jeans, PA-C  Encounter Date: 03/04/2014      PT End of Session - 03/04/14 0951    Visit Number 2   Number of Visits 16   Date for PT Re-Evaluation 04/19/14   PT Start Time 0932   PT Stop Time 0955   PT Time Calculation (min) 63 min   Activity Tolerance Patient tolerated treatment well;Patient limited by pain      Past Medical History  Diagnosis Date  . Asthma   . Meniere's disease   . Environmental and seasonal allergies   . Depression     Resolved  . Chicken pox   . Hyperlipidemia   . Essential hypertension, benign 08/03/2013  . Thoracic outlet syndrome     Past Surgical History  Procedure Laterality Date  . Nevus biopsy      There were no vitals taken for this visit.  Visit Diagnosis:  Left ankle sprain, initial encounter      Subjective Assessment - 03/04/14 0855    Symptoms States he went back to work 1 1/2 weeks ago.  Reports he woke up Tuesday with left ankle swollen  and a bruise on the dorsal foot.  States he took a pain pill, fell asleep and missed  his PT appt.   Patient unable identify what might have aggravated his ankle.  Worse with curling toes or going up on tip toes.     Currently in Pain? Yes   Pain Score 2    Pain Location Ankle   Pain Orientation Left   Aggravating Factors  unlevel surfaces   Pain Relieving Factors ibuprofen, ice          [facility] PT Assessment - 03/04/14 0942    Observation/Other Assessments   Lower Extremity Functional Scale  45/80   AROM   Left Ankle Dorsiflexion 3  Post treatment 5 degrees                  [facility] Adult PT Treatment/Exercise - 03/04/14 0924    Exercises   Exercises Ankle   Modalities   Modalities  Cryotherapy   Cryotherapy   Number Minutes Cryotherapy 15 Minutes   Cryotherapy Location Ankle   Type of Cryotherapy Other (comment)  vasocompression 32 degrees, low compression with elevation   Manual Therapy   Manual Therapy Joint mobilization;Myofascial release   Joint Mobilization Subtalar left rearfoot distract; tib-fib mob, talocrual AP, lateral talocural glides all grade 1 and 2 30 sec oscillations   Myofascial Release fascia stretch   Ankle Exercises: Stretches   Other Stretch left on table self mob with movement rocking back and forth  15x    Ankle Exercises: Aerobic   Stationary Bike 5 min   Ankle Exercises: Machines for Strengthening   Cybex Leg Press --  20# bilateral, single leg 15x each; bilateral heel raise 15x   Ankle Exercises: Standing   BAPS Standing;Level 1;10 reps  PF/DF only   SLS SLS left with right 4 ways   Other Standing Ankle Exercises Prostretch 4x 20 sec holds   Other Standing Ankle Exercises WB on left with right heel touch on floor 15x                  PT Short Term  Goals - 03/04/14 1003    PT SHORT TERM GOAL #1   Title Independent in initial HEP for intermediate level ankle strengthening and ROM   Time 4   Period Weeks   Status Partially Met   PT SHORT TERM GOAL #2   Title The patient will have improved ankle DF to 5 degrees needed for greater ease getting in/out of the car with less pain   Time 4   Period Weeks   Status Achieved   PT SHORT TERM GOAL #3   Title The patient will have improved proprioception to withstand moderate challenges with soft surfaces and change of direction 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   Time 8   Period Weeks   Status On-going   PT LONG TERM GOAL #3   Title Left ankle DF improved to 8 degrees needed for descending steps/curbs with ease for return to work full duty   Time 8   Period Weeks   Status On-going   PT LONG TERM GOAL #4   Title Patient will report an overall improvement in pain and function >50%.   Time 8   Period Weeks   Status On-going   PT LONG TERM GOAL #5   Title Fig 8 circumferential measurement within 1 cm difference between right/left to facilitate healing of ligaments.   Time 8   Period Weeks   Status On-going               Plan - 03/04/14 2979    Clinical Impression Statement Patient presents with a bruise on the dorsal aspect of left foot.  Mild swelling lateral ankle and dorsal foot.   States he had a lot of swelling a couple of days ago. Current LEFS 45/80. Back to work and states it was going fine until a couple of days ago.  Descends steps "sideways".  Ankle DF ROM improved post treatment. Verbal cues for patellofemoral alignment and to monitorin pain.  Progressing with STGs.  Visible reduction in swelling after vasocompression.  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     PT Next Visit Plan Standing proprioception; heel raises on leg press; vasocompression; Prostretch (2 1/2 months s/p ankle sprain)        Problem List Patient Active Problem List   Diagnosis Date Noted  . Left ankle sprain 12/23/2013  . Elevated triglycerides with high cholesterol 09/06/2013  . Acute bronchitis 08/03/2013  . Anxiety state, unspecified 08/03/2013  . Essential hypertension, benign 08/03/2013  . Environmental allergies 03/23/2013  . Visit for preventive health examination 03/23/2013  . Meniere disease 03/23/2013  . Palpitations 03/23/2013  . Intrinsic asthma   . SOB (shortness of breath)     Alvera Singh 03/04/2014, 10:06 AM  [facility] [address] [place], [place], [address] Phone: [phone]   Fax:  [place],  PT 03/04/2014 10:06 AM Phone: [phone] Fax: [phone]

## 2014-03-09 ENCOUNTER — Ambulatory Visit: Payer: 59 | Admitting: Physical Therapy

## 2014-03-09 DIAGNOSIS — S93402A Sprain of unspecified ligament of left ankle, initial encounter: Secondary | ICD-10-CM

## 2014-03-09 NOTE — Therapy (Signed)
[place], [place], [address] Phone: [phone]   Fax:  [phone]  Physical Therapy Treatment  Patient Details  Name: Steve Jacobs MRN: [id number] Date of Birth: 11-21-1985 Referring Provider:  Brunetta Jeans, PA-C  Encounter Date: 03/09/2014      PT End of Session - 03/09/14 1045    Visit Number 3   Number of Visits 16   Date for PT Re-Evaluation 04/19/14   PT Start Time 1031   PT Stop Time 1113   PT Time Calculation (min) 42 min   Activity Tolerance Patient tolerated treatment well   Behavior During Therapy [facility] for tasks assessed/performed      Past Medical History  Diagnosis Date  . Asthma   . Meniere's disease   . Environmental and seasonal allergies   . Depression     Resolved  . Chicken pox   . Hyperlipidemia   . Essential hypertension, benign 08/03/2013  . Thoracic outlet syndrome     Past Surgical History  Procedure Laterality Date  . Nevus biopsy      There were no vitals taken for this visit.  Visit Diagnosis:  Left ankle sprain, initial encounter      Subjective Assessment - 03/09/14 1037    Symptoms pt arrived 15 min late today. He states that he has been doing his HEP at home and reports that he still has some pain at the last few degrees of DF/PF  but overall is doing much better since the last visit.    Currently in Pain? Yes   Pain Score 1    Pain Location Ankle   Pain Orientation Left   Pain Descriptors / Indicators Aching   Pain Onset More than a month ago                    [facility] Adult PT Treatment/Exercise - 03/09/14 1038    Exercises   Exercises Ankle   Cryotherapy   Number Minutes Cryotherapy 15 Minutes   Cryotherapy Location Ankle   Type of Cryotherapy --  vasopnuematic cryotherapy high compression   Manual Therapy   Manual Therapy Joint mobilization   Joint Mobilization subtalar inversion/eversion grade 2-3 x 10, DF/PF grade 2-3 x  10 ea.   Ankle Exercises: Aerobic   Stationary Bike 5 min   Ankle Exercises: Machines for Strengthening   Cybex Leg Press 20# x 10 BIL, LLE 10  pushing up with BIL LE and lowering with LLE   Ankle Exercises: Standing   BAPS Standing;Level 1;10 reps;Other (comment)  LLE DF/PF x 10,rolling clockwise x 10   Rocker Board 1 minute  DF/PF   Ankle Exercises: Seated   Towel Crunch 5 reps  L ankle x 4 sets   Towel Crunch Weights (lbs) 2#                PT Education - 03/09/14 1120    Education provided Yes   Education Details towel scrunches   Person(s) Educated Patient   Methods Explanation   Comprehension Verbalized understanding          PT Short Term Goals - 03/04/14 1003    PT SHORT TERM GOAL #1   Title Independent in initial HEP for intermediate level ankle strengthening and ROM   Time 4   Period Weeks   Status Partially Met   PT SHORT TERM GOAL #2   Title The patient will have improved ankle DF to 5 degrees  needed for greater ease getting in/out of the car with less pain   Time 4   Period Weeks   Status Achieved   PT SHORT TERM GOAL #3   Title The patient will have improved proprioception to withstand moderate challenges with soft surfaces and change of direction 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   Time 8   Period Weeks   Status On-going   PT LONG TERM GOAL #3   Title Left ankle DF improved to 8 degrees needed for descending steps/curbs with ease for return to work full duty   Time 8   Period Weeks   Status On-going   PT LONG TERM GOAL #4    Title Patient will report an overall improvement in pain and function >50%.   Time 8   Period Weeks   Status On-going   PT LONG TERM GOAL #5   Title Fig 8 circumferential measurement within 1 cm difference between right/left to facilitate healing of ligaments.   Time 8   Period Weeks   Status On-going               Plan - 03/09/14 1129    Clinical Impression Statement pt continues to demonstrate Left ankle/foot pain that is worse with end range PF/DF.  Visual inspection reveals minimal swelling located at the lateral talocrural joint. pt    PT Next Visit Plan Standing proprioception; heel raises on leg press; vasocompression; Prostretch (2 1/2 months s/p ankle sprain)   PT Home Exercise Plan towel scrunches        Problem List Patient Active Problem List   Diagnosis Date Noted  . Left ankle sprain 12/23/2013  . Elevated triglycerides with high cholesterol 09/06/2013  . Acute bronchitis 08/03/2013  . Anxiety state, unspecified 08/03/2013  . Essential hypertension, benign 08/03/2013  . Environmental allergies 03/23/2013  . Visit for preventive health examination 03/23/2013  . Meniere disease 03/23/2013  . Palpitations 03/23/2013  . Intrinsic asthma   . SOB (shortness of breath)    Starr Lake PT, DPT, LAT, ATC  03/09/2014  11:49 AM   [facility] [address] [place], [place], [address] Phone: [phone]   Fax:  [phone]

## 2014-03-09 NOTE — Patient Instructions (Signed)
Toe Curl: Unilateral   With right foot resting on towel, slowly bunch up towel by curling toes. Place 1-3 pound weight at end of towel Repeat 3 times per set. Do 2 sets per session. Do 1 sessions per day.  http://orth.exer.us/18   Copyright  VHI. All rights reserved.

## 2014-03-10 ENCOUNTER — Ambulatory Visit: Payer: 59 | Admitting: Family Medicine

## 2014-03-11 ENCOUNTER — Ambulatory Visit: Payer: 59 | Admitting: Physical Therapy

## 2014-03-15 ENCOUNTER — Ambulatory Visit: Payer: 59 | Admitting: Physical Therapy

## 2014-03-17 ENCOUNTER — Encounter: Payer: Self-pay | Admitting: Family Medicine

## 2014-03-17 ENCOUNTER — Ambulatory Visit (INDEPENDENT_AMBULATORY_CARE_PROVIDER_SITE_OTHER): Payer: 59 | Admitting: Family Medicine

## 2014-03-17 VITALS — BP 135/92 | Ht 70.0 in | Wt 190.0 lb

## 2014-03-17 DIAGNOSIS — S93402D Sprain of unspecified ligament of left ankle, subsequent encounter: Secondary | ICD-10-CM

## 2014-03-17 NOTE — Patient Instructions (Signed)
Wear ASO when up and walking around. Icing as needed 15 minutes at a time 3-4 times a day. Continue with physical therapy and home exercises. Follow up with me in 6 weeks. Continue current restrictions until follow-up.

## 2014-03-18 ENCOUNTER — Ambulatory Visit: Payer: 59 | Admitting: Physical Therapy

## 2014-03-21 NOTE — Progress Notes (Signed)
PCP: Leeanne Rio, PA-C  Subjective:   HPI: Patient is a 29 y.o. male here for left ankle injury.  12/9: Patient reports he was on a slate path when he fell down from the top step about 3 feet and severely inverted left ankle. Heard at least 3 pops and couldn't bear weight after this. No prior injuries. Radiographs negative in ED. Has been icing, taking ibuprofen and percocet. Using ASO and a walker with minimal weight bearing.  12/31: Patient reports he feels better compared to last visit. Using ASO. Doing ROM exercises. Taking ibuprofen. Still walking with a mild limp. No longer using crutches.  02/03/14: Patient reports his ankle feels less stiff but doing exercises makes pain worsen. He did report however he's been doing exercises 3 times a day instead of once. Has not started physical therapy yet - first appointment 1/25. Anxious to get started in rehab. Taking ibuprofen 800mg  three times a day. Some swelling outside of foot.  3/3: Patient reports he has improved since last visit. When starting PT he had a couple times where ankle would swell up and at one time it turned black and blue dorsal left foot. Doing home exercises. Ankle still feels unstable, stiff - pain currently 1-2/10.  Past Medical History  Diagnosis Date  . Asthma   . Meniere's disease   . Environmental and seasonal allergies   . Depression     Resolved  . Chicken pox   . Hyperlipidemia   . Essential hypertension, benign 08/03/2013  . Thoracic outlet syndrome     Current Outpatient Prescriptions on File Prior to Visit  Medication Sig Dispense Refill  . albuterol (PROVENTIL HFA;VENTOLIN HFA) 108 (90 BASE) MCG/ACT inhaler Inhale 2 puffs into the lungs every 6 (six) hours as needed for wheezing. 6.7 g 2  . budesonide-formoterol (SYMBICORT) 80-4.5 MCG/ACT inhaler Inhale 2 puffs into the lungs 2 (two) times daily. 1 Inhaler 6  . cetirizine (ZYRTEC) 10 MG tablet Take 10 mg by mouth daily.     . diazepam (VALIUM) 2 MG tablet TAKE 1 TABLET BY MOUTH EVERY 12 HOURS AS NEEDED FOR ANXIETY 60 tablet PRN  . diphenhydrAMINE (BENADRYL) 25 MG tablet Take 25 mg by mouth every 6 (six) hours as needed for itching or allergies.    Marland Kitchen escitalopram (LEXAPRO) 10 MG tablet TAKE 1 TABLET BY MOUTH ONCE DAILY 30 tablet 4  . ibuprofen (ADVIL,MOTRIN) 800 MG tablet Take 1 tablet (800 mg total) by mouth 3 (three) times daily. 21 tablet 0  . lisinopril (PRINIVIL,ZESTRIL) 10 MG tablet Take 1 tablet (10 mg total) by mouth daily. FILL ON OR AFTER February 23, 2014 30 tablet 2  . montelukast (SINGULAIR) 10 MG tablet Take 1 tablet (10 mg total) by mouth at bedtime. 90 tablet 1  . Omega-3 Fatty Acids (FISH OIL) 1000 MG CAPS Take 1 capsule by mouth daily.    . ondansetron (ZOFRAN ODT) 8 MG disintegrating tablet Take 1 tablet (8 mg total) by mouth every 8 (eight) hours as needed for nausea or vomiting. 20 tablet 0  . ondansetron (ZOFRAN) 8 MG tablet Take 1 tablet (8 mg total) by mouth every 8 (eight) hours as needed for nausea or vomiting. 20 tablet 0  . oxyCODONE-acetaminophen (PERCOCET/ROXICET) 5-325 MG per tablet Take 1-2 tablets by mouth every 6 (six) hours as needed for severe pain. (Patient not taking: Reported on 02/22/2014) 60 tablet 0   No current facility-administered medications on file prior to visit.    Past Surgical  History  Procedure Laterality Date  . Nevus biopsy      No Known Allergies  History   Social History  . Marital Status: Single    Spouse Name: N/A  . Number of Children: N/A  . Years of Education: N/A   Occupational History  . Not on file.   Social History Main Topics  . Smoking status: Never Smoker   . Smokeless tobacco: Never Used  . Alcohol Use: 0.0 oz/week    0 Standard drinks or equivalent per week     Comment: 2-3 beers nightly  . Drug Use: No  . Sexual Activity: Yes    Birth Control/ Protection: None     Comment: male - 1 partner   Other Topics Concern  . Not on  file   Social History Narrative    Family History  Problem Relation Age of Onset  . Hyperlipidemia Father     Living  . Stroke Father   . Hypertension Father   . Alcohol abuse Mother     Living  . Diabetes Mellitus II Maternal Grandfather   . Hypertension Maternal Grandfather   . Heart failure Maternal Grandfather   . Kidney disease Maternal Grandfather   . Heart disease Maternal Grandmother   . Melanoma Paternal Grandmother   . Heart attack Paternal Grandfather   . Migraines Brother   . Healthy Brother     x2  . Drug abuse Sister     Died of Overdose    BP 135/92 mmHg  Ht 5\' 10"  (1.778 m)  Wt 190 lb (86.183 kg)  BMI 27.26 kg/m2  Review of Systems: See HPI above.    Objective:  Physical Exam:  Gen: NAD  Left ankle: Minimal swelling, no bruising laterally. No other deformity. FROM Mild TTP over ATFL, none ant ankle joint.  No lateral malleolus, base 5th, medial malleolus, navicular, other tenderness. 1+ ant drawer, 2+ talar tilt. Negative syndesmotic compression. Thompsons test negative. NV intact distally.    Assessment & Plan:  1. Left ankle sprain - radiographs negative.  Grade 3.  He continues to have clinical instability and still pain with this.  Continue with physical therapy, home exercises.  Continue current restrictions.  New ASO provided when ambulatory.  F/u in 6 weeks.

## 2014-03-21 NOTE — Assessment & Plan Note (Signed)
radiographs negative.  Grade 3.  He continues to have clinical instability and still pain with this.  Continue with physical therapy, home exercises.  Continue current restrictions.  New ASO provided when ambulatory.  F/u in 6 weeks.

## 2014-03-23 ENCOUNTER — Ambulatory Visit: Payer: 59 | Attending: Family Medicine | Admitting: Physical Therapy

## 2014-03-23 DIAGNOSIS — S93402A Sprain of unspecified ligament of left ankle, initial encounter: Secondary | ICD-10-CM | POA: Insufficient documentation

## 2014-03-23 NOTE — Therapy (Signed)
[name], [place], [address] Phone: [phone]   Fax:  [phone]  Physical Therapy Treatment  Patient Details  Name: Steve Jacobs MRN: [id number] Date of Birth: August 24, 1985 Referring Provider:  Brunetta Jeans, PA-C  Encounter Date: 03/23/2014      PT End of Session - 03/23/14 1141    Visit Number 4   Number of Visits 16   Date for PT Re-Evaluation 04/19/14   PT Start Time 1016   PT Stop Time 1114   PT Time Calculation (min) 58 min   Activity Tolerance Patient tolerated treatment well;Patient limited by pain      Past Medical History  Diagnosis Date  . Asthma   . Meniere's disease   . Environmental and seasonal allergies   . Depression     Resolved  . Chicken pox   . Hyperlipidemia   . Essential hypertension, benign 08/03/2013  . Thoracic outlet syndrome     Past Surgical History  Procedure Laterality Date  . Nevus biopsy      There were no vitals taken for this visit.  Visit Diagnosis:  Left ankle sprain, initial encounter      Subjective Assessment - 03/23/14 1116    Symptoms .  50% better with orthotics.  Has a new rigid brace medial lateral support new from MD too.He got it the day before he got the prthotics.  Does not have shoes that fit both bracs and orthotics.     Aggravating Factors  at end of day   Pain Relieving Factors orthotics, cold,    Multiple Pain Sites No                    [facility] Adult PT Treatment/Exercise - 03/23/14 1020    Cryotherapy   Number Minutes Cryotherapy 15 Minutes   Cryotherapy Location Ankle   Type of Cryotherapy --  vasopneumatic, moderate pressure.  was ready for moderate pr   Ankle Exercises: Aerobic   Elliptical 6.5 minutes  foot forward on pedal helped decrease pain.   Ankle Exercises: Seated   Towel Crunch --  showed how to do similar in shoe, progress to standing   Ankle Exercises: Supine   Other Supine Ankle Exercises  DF 10 degrees today AROM   Ankle Exercises: Standing   SLS with band RT, 4 way added to home.  7 seconds max  wobbly   Rocker Board 1 minute  with the board under.  PF/DF   Heel Raises 10 reps   Toe Raise 10 reps  2 feet, unable 1.  added to home   Ankle Exercises: Stretches   Other Stretch Figure 8 64.5 cm, 32.5 cm girth 17 cm above lateral malleoli                PT Education - 03/23/14 1139    Education provided Yes   Education Details standing ankle, Standing ankle with band (Hip 4-way)  both issued from drawer   Person(s) Educated Patient   Methods Explanation;Demonstration;Handout   Comprehension Verbalized understanding          PT Short Term Goals - 03/23/14 1149    PT SHORT TERM GOAL #1   Title Independent in initial HEP for intermediate level ankle strengthening and ROM   Time 4   Period Weeks   Status Achieved   PT SHORT TERM GOAL #2   Title The patient will have improved ankle DF to 5 degrees needed  for greater ease getting in/out of the car with less pain   Time 4   Period Weeks   Status Achieved   PT SHORT TERM GOAL #3   Title The patient will have improved proprioception to withstand moderate challenges with soft surfaces and change of direction 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   Time 8   Period Weeks   Status On-going   PT LONG TERM GOAL #3   Title Left ankle DF improved to 8 degrees needed for descending steps/curbs with ease for return to work full duty   Baseline 10   Time 8   Period Weeks   Status Achieved   PT LONG TERM GOAL  #4   Title Patient will report an overall improvement in pain and function >50%.   Time 8   Period Weeks   Status On-going               Plan - 03/23/14 1142    Clinical Impression Statement DF goal met.  No limp today, reports one at end of session.  Has not tried steps.  He wants to attend 1 X a week.  due to work  able to advance home exercise.   PT Next Visit Plan Standing proprioception; heel raises on leg press; vasocompression; Prostretch (2 1/2 months s/p ankle sprain)  review 4 way hip/ankle, ankle strengthening standing.   Consulted and Agree with Plan of Care Patient        Problem List Patient Active Problem List   Diagnosis Date Noted  . Left ankle sprain 12/23/2013  . Elevated triglycerides with high cholesterol 09/06/2013  . Acute bronchitis 08/03/2013  . Anxiety state, unspecified 08/03/2013  . Essential hypertension, benign 08/03/2013  . Environmental allergies 03/23/2013  . Visit for preventive health examination 03/23/2013  . Meniere disease 03/23/2013  . Palpitations 03/23/2013  . Intrinsic asthma   . SOB (shortness of breath)     HARRIS,KAREN 03/23/2014, 11:52 AM  [facility] [address] [place], [place], [address] Phone: [phone]   Fax:  [phone]

## 2014-03-30 ENCOUNTER — Ambulatory Visit: Payer: 59 | Admitting: Rehabilitation

## 2014-03-30 DIAGNOSIS — S93402A Sprain of unspecified ligament of left ankle, initial encounter: Secondary | ICD-10-CM

## 2014-03-30 NOTE — Therapy (Signed)
[place], [place], [address] Phone: [phone]   Fax:  [phone]  Physical Therapy Treatment  Patient Details  Name: Steve Jacobs MRN: [id number] Date of Birth: [birth year] Referring Provider:  Brunetta Jeans, PA-C  Encounter Date: 03/30/2014      PT End of Session - 03/30/14 1118    Visit Number 5   Number of Visits 16   Date for PT Re-Evaluation 04/19/14   PT Start Time 0942   PT Stop Time 1030   PT Time Calculation (min) 48 min      Past Medical History  Diagnosis Date  . Asthma   . Meniere's disease   . Environmental and seasonal allergies   . Depression     Resolved  . Chicken pox   . Hyperlipidemia   . Essential hypertension, benign 08/03/2013  . Thoracic outlet syndrome     Past Surgical History  Procedure Laterality Date  . Nevus biopsy      There were no vitals filed for this visit.  Visit Diagnosis:  Left ankle sprain, initial encounter      Subjective Assessment - 03/30/14 1116    Symptoms This is the first time I have come in without pain.    Currently in Pain? No/denies            [facility] PT Assessment - 03/30/14 0001    Observation/Other Assessments   Observations Left 56 cm figure 8, right 55 cm   AROM   Left Ankle Dorsiflexion 12   Left Ankle Plantar Flexion 52   Left Ankle Inversion 36   Left Ankle Eversion 24   Strength   Left Ankle Dorsiflexion 4+/5   Left Ankle Inversion 4+/5   Left Ankle Eversion 4+/5                   [facility] Adult PT Treatment/Exercise - 03/30/14 1017    Cryotherapy   Number Minutes Cryotherapy 15 Minutes   Cryotherapy Location Ankle   Type of Cryotherapy --  Vaso Mod 32 degrees   Ankle Exercises: Aerobic   Elliptical rec bike level 3 x 4 min   Ankle Exercises: Standing   SLS on foam with 4 way hip x 10 each   Rebounder on and off foam   Toe Raise 10 reps  left only   Other Standing Ankle Exercises toe  walking, heel walking   Other Standing Ankle Exercises 6 in step downs x 10                  PT Short Term Goals - 03/23/14 1149    PT SHORT TERM GOAL #1   Title Independent in initial HEP for intermediate level ankle strengthening and ROM   Time 4   Period Weeks   Status Achieved   PT SHORT TERM GOAL #2   Title The patient will have improved ankle DF to 5 degrees needed for greater ease getting in/out of the car with less pain   Time 4   Period Weeks   Status Achieved   PT SHORT TERM GOAL #3   Title The patient will have improved proprioception to withstand moderate challenges with soft surfaces and 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   Status (p) Partially Met  4/10 PF, able to do 10 single heel lifts   PT LONG TERM GOAL #3   Title Left ankle DF improved to 8 degrees needed for descending steps/curbs with ease for return to work full duty   Baseline 10   Time 8   Period Weeks   Status Achieved   PT LONG TERM GOAL #4   Title Patient will report an overall improvement in pain and function >50%.   Time 8   Period Weeks   Status Achieved   PT LONG TERM GOAL #5   Title Fig 8 circumferential measurement within 1 cm difference between right/left to facilitate healing of ligaments.   Time 8   Period Weeks   Status Achieved               Plan - 03/30/14 1119    Clinical Impression Statement Good increase in AROM and ankle strength. Able to perform 10 single heel raises without increased pain. Good endurance with standing balance  exercises. Rigid orthotics cause pain on posterior lateral foot with single leg activities.    PT Next Visit Plan continue ankle strength standing, single leg activities, vaso        Problem List Patient Active Problem List   Diagnosis Date Noted  . Left ankle sprain 12/23/2013  . Elevated triglycerides with high cholesterol 09/06/2013  . Acute bronchitis 08/03/2013  . Anxiety state, unspecified 08/03/2013  . Essential hypertension, benign 08/03/2013  . Environmental allergies 03/23/2013  . Visit for preventive health examination 03/23/2013  . Meniere disease 03/23/2013  . Palpitations 03/23/2013  . Intrinsic asthma   . SOB (shortness of breath)     Dorene Ar, PTA 03/30/2014, 11:21 AM  [facility] [address] [place], [place], [address] Phone: [phone]   Fax:  [phone]

## 2014-03-31 ENCOUNTER — Ambulatory Visit: Payer: 59 | Admitting: Rehabilitation

## 2014-04-06 ENCOUNTER — Encounter: Payer: 59 | Admitting: Physical Therapy

## 2014-04-07 ENCOUNTER — Ambulatory Visit: Payer: 59 | Admitting: Physical Therapy

## 2014-04-11 ENCOUNTER — Other Ambulatory Visit: Payer: Self-pay | Admitting: Physician Assistant

## 2014-04-11 NOTE — Telephone Encounter (Signed)
Rx request to pharmacy/SLS  

## 2014-04-13 ENCOUNTER — Ambulatory Visit: Payer: 59 | Admitting: Physical Therapy

## 2014-04-13 DIAGNOSIS — S93402A Sprain of unspecified ligament of left ankle, initial encounter: Secondary | ICD-10-CM

## 2014-04-13 NOTE — Therapy (Signed)
[place], [place], [address] Phone: [phone]   Fax:  [phone]  Physical Therapy Treatment  Patient Details  Name: Steve Jacobs MRN: [id number] Date of Birth: 13-Aug-1985 Referring Provider:  Brunetta Jeans, PA-C  Encounter Date: 04/13/2014      PT End of Session - 04/13/14 1404    Visit Number 6   Number of Visits 16   PT Start Time 1330   PT Stop Time 1400   PT Time Calculation (min) 30 min   Activity Tolerance Patient tolerated treatment well   Behavior During Therapy [facility] for tasks assessed/performed      Past Medical History  Diagnosis Date  . Asthma   . Meniere's disease   . Environmental and seasonal allergies   . Depression     Resolved  . Chicken pox   . Hyperlipidemia   . Essential hypertension, benign 08/03/2013  . Thoracic outlet syndrome     Past Surgical History  Procedure Laterality Date  . Nevus biopsy      There were no vitals filed for this visit.  Visit Diagnosis:  Left ankle sprain, initial encounter          [facility] PT Assessment - 04/13/14 1334    Observation/Other Assessments   Observations 51.9cm figure 8 ankle tape.    Lower Extremity Functional Scale  75/80   AROM   Left Ankle Dorsiflexion 18   Left Ankle Plantar Flexion 60   Left Ankle Inversion 40   Left Ankle Eversion 26   Strength   Left Ankle Dorsiflexion 5/5   Left Ankle Plantar Flexion 5/5   Left Ankle Inversion 5/5   Left Ankle Eversion 5/5   Anterior Drawer Test   Findings Negative   Talar Tilt Test    Findings Negative                   [facility] Adult PT Treatment/Exercise - 04/13/14 0001    Balance   Balance Assessed Yes   Static Standing Balance   Single Leg Stance - Left Leg --  x 30 sec with eyes closed                PT Education - 04/13/14 1403    Education provided Yes   Education Details Discharge, advanced HEP, continue with current HEP, gave  blue theraband    Person(s) Educated Patient   Methods Explanation   Comprehension Verbalized understanding          PT Short Term Goals - 04/13/14 1346    PT SHORT TERM GOAL #1   Title Independent in initial HEP for intermediate level ankle strengthening and ROM   Time 4   Period Weeks   Status Achieved   PT SHORT TERM GOAL #2   Title The patient will have improved ankle DF to 5 degrees needed for greater ease getting in/out of the car with less pain   Time 4   Period Weeks   Status Achieved   PT SHORT TERM GOAL #3   Title The patient will have improved proprioception to withstand moderate challenges with soft surfaces and 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   Time 8   Period Weeks   Status Achieved   PT LONG TERM GOAL #3   Title Left ankle DF improved to 8 degrees needed for descending steps/curbs with ease for return to work full duty   Time 8   Period Weeks   Status Achieved   PT LONG TERM GOAL #4   Title Patient will report an overall improvement in pain and function >50%.   Time 8   Period Weeks   Status Achieved   PT LONG TERM GOAL #5   Title Fig 8 circumferential measurement within 1 cm difference between right/left to facilitate healing of ligaments.   Time 8   Period Weeks   Status Achieved               Plan - 04/13/14 1404    Clinical Impression Statement [name] has made great progess with increased AROM of the Left ankle with full 5/5 strength and additonally he doesn't have any pain. Ankle figure 8 measurement have  dropped from 56cm to 51.9 cm. Plan to discharge [place] form physical therapy because He has met all goals  and is able to maintain his exercise independently.   PT Next Visit Plan Discharged from PT   PT Home Exercise Plan single leg stance, and tandem stance in corner with eyes closed   Consulted and Agree with Plan of Care Patient        Problem List Patient Active Problem List   Diagnosis Date Noted  . Left ankle sprain 12/23/2013  . Elevated triglycerides with high cholesterol 09/06/2013  . Acute bronchitis 08/03/2013  . Anxiety state, unspecified 08/03/2013  . Essential hypertension, benign 08/03/2013  . Environmental allergies 03/23/2013  . Visit for preventive health examination 03/23/2013  . Meniere disease 03/23/2013  . Palpitations 03/23/2013  . Intrinsic asthma   . SOB (shortness of breath)               PHYSICAL THERAPY DISCHARGE SUMMARY  Visits from Start of Care: 6  Current functional level related to goals / functional outcomes: LEFS 75/80   Remaining deficits: N/A   Education / Equipment: Advanced HEP handout, continue with HEP and strengthening and gave blue theraband for increased resistance.  Plan: Patient agrees to discharge.  Patient goals were met. Patient is being discharged due to meeting the stated rehab goals.  ?????    Starr Lake PT, DPT, LAT, ATC  04/13/2014  2:10 PM   [facility] [address] [place], [place], [address] Phone: [phone]   Fax:  [phone]

## 2014-04-13 NOTE — Patient Instructions (Signed)
   Parminder Trapani PT, DPT, LAT, ATC  [facility] Phone: [phone]     

## 2014-04-15 ENCOUNTER — Other Ambulatory Visit: Payer: Self-pay | Admitting: Family Medicine

## 2014-04-18 ENCOUNTER — Telehealth: Payer: Self-pay | Admitting: *Deleted

## 2014-04-18 NOTE — Telephone Encounter (Signed)
Unable to reach patient at time of Pre-Visit Call.  Left message for patient to return call when available.    

## 2014-04-19 ENCOUNTER — Encounter: Payer: 59 | Admitting: Physician Assistant

## 2014-04-19 DIAGNOSIS — Z0289 Encounter for other administrative examinations: Secondary | ICD-10-CM

## 2014-04-22 ENCOUNTER — Ambulatory Visit: Payer: 59 | Admitting: Family Medicine

## 2014-04-25 ENCOUNTER — Encounter: Payer: Self-pay | Admitting: Family Medicine

## 2014-04-25 ENCOUNTER — Telehealth: Payer: Self-pay | Admitting: Family Medicine

## 2014-04-25 ENCOUNTER — Telehealth: Payer: Self-pay | Admitting: Physician Assistant

## 2014-04-25 ENCOUNTER — Ambulatory Visit: Payer: 59 | Admitting: Family Medicine

## 2014-04-25 ENCOUNTER — Ambulatory Visit (INDEPENDENT_AMBULATORY_CARE_PROVIDER_SITE_OTHER): Payer: 59 | Admitting: Family Medicine

## 2014-04-25 VITALS — BP 131/90 | HR 90 | Ht 70.0 in | Wt 195.0 lb

## 2014-04-25 DIAGNOSIS — S93402D Sprain of unspecified ligament of left ankle, subsequent encounter: Secondary | ICD-10-CM | POA: Diagnosis not present

## 2014-04-25 NOTE — Telephone Encounter (Signed)
PT was no show for CPE on 04/19/14 - scheduled for CPE on 05/06/14- charge PT ?

## 2014-04-25 NOTE — Telephone Encounter (Signed)
Charge. 

## 2014-04-25 NOTE — Telephone Encounter (Signed)
That's fine as well - thanks!

## 2014-04-26 NOTE — Assessment & Plan Note (Signed)
Grade 3.  Clinically healed at this point.  Will return to full duty without restrictions this week.  Home exercises 2-3 times a week next 6 weeks then discontinue.  F/u prn.

## 2014-04-26 NOTE — Progress Notes (Signed)
PCP: Leeanne Rio, PA-C  Subjective:   HPI: Patient is a 29 y.o. male here for left ankle injury.  12/9: Patient reports he was on a [place] when he fell down from the top step about 3 feet and severely inverted left ankle. Heard at least 3 pops and couldn't bear weight after this. No prior injuries. Radiographs negative in ED. Has been icing, taking ibuprofen and percocet. Using ASO and a walker with minimal weight bearing.  12/31: Patient reports he feels better compared to last visit. Using ASO. Doing ROM exercises. Taking ibuprofen. Still walking with a mild limp. No longer using crutches.  02/03/14: Patient reports his ankle feels less stiff but doing exercises makes pain worsen. He did report however he's been doing exercises 3 times a day instead of once. Has not started physical therapy yet - first appointment 1/25. Anxious to get started in rehab. Taking ibuprofen 800mg  three times a day. Some swelling outside of foot.  3/3: Patient reports he has improved since last visit. When starting PT he had a couple times where ankle would swell up and at one time it turned black and blue dorsal left foot. Doing home exercises. Ankle still feels unstable, stiff - pain currently 1-2/10.  4/11: Patient reports he has no pain now, feels stable. No other complaints.  Past Medical History  Diagnosis Date  . Asthma   . Meniere's disease   . Environmental and seasonal allergies   . Depression     Resolved  . Chicken pox   . Hyperlipidemia   . Essential hypertension, benign 08/03/2013  . Thoracic outlet syndrome     Current Outpatient Prescriptions on File Prior to Visit  Medication Sig Dispense Refill  . albuterol (PROVENTIL HFA;VENTOLIN HFA) 108 (90 BASE) MCG/ACT inhaler Inhale 2 puffs into the lungs every 6 (six) hours as needed for wheezing. 6.7 g 2  . budesonide-formoterol (SYMBICORT) 80-4.5 MCG/ACT inhaler Inhale 2 puffs into the lungs 2 (two) times  daily. 1 Inhaler 6  . cetirizine (ZYRTEC) 10 MG tablet Take 10 mg by mouth daily.    . diazepam (VALIUM) 2 MG tablet TAKE 1 TABLET BY MOUTH EVERY 12 HOURS AS NEEDED FOR ANXIETY 60 tablet PRN  . diphenhydrAMINE (BENADRYL) 25 MG tablet Take 25 mg by mouth every 6 (six) hours as needed for itching or allergies.    Marland Kitchen escitalopram (LEXAPRO) 10 MG tablet TAKE 1 TABLET BY MOUTH ONCE DAILY 30 tablet 0  . ibuprofen (ADVIL,MOTRIN) 800 MG tablet Take 1 tablet (800 mg total) by mouth 3 (three) times daily. 21 tablet 0  . lisinopril (PRINIVIL,ZESTRIL) 10 MG tablet Take 1 tablet (10 mg total) by mouth daily. FILL ON OR AFTER February 23, 2014 30 tablet 2  . montelukast (SINGULAIR) 10 MG tablet TAKE 1 TABLET BY MOUTH AT BEDTIME. 30 tablet 3  . Omega-3 Fatty Acids (FISH OIL) 1000 MG CAPS Take 1 capsule by mouth daily.    . ondansetron (ZOFRAN ODT) 8 MG disintegrating tablet Take 1 tablet (8 mg total) by mouth every 8 (eight) hours as needed for nausea or vomiting. 20 tablet 0  . ondansetron (ZOFRAN) 8 MG tablet Take 1 tablet (8 mg total) by mouth every 8 (eight) hours as needed for nausea or vomiting. 20 tablet 0  . oxyCODONE-acetaminophen (PERCOCET/ROXICET) 5-325 MG per tablet Take 1-2 tablets by mouth every 6 (six) hours as needed for severe pain. (Patient not taking: Reported on 02/22/2014) 60 tablet 0   No current facility-administered  medications on file prior to visit.    Past Surgical History  Procedure Laterality Date  . Nevus biopsy      No Known Allergies  History   Social History  . Marital Status: Single    Spouse Name: N/A  . Number of Children: N/A  . Years of Education: N/A   Occupational History  . Not on file.   Social History Main Topics  . Smoking status: Never Smoker   . Smokeless tobacco: Never Used  . Alcohol Use: 0.0 oz/week    0 Standard drinks or equivalent per week     Comment: 2-3 beers nightly  . Drug Use: No  . Sexual Activity: Yes    Birth Control/ Protection:  None     Comment: male - 1 partner   Other Topics Concern  . Not on file   Social History Narrative    Family History  Problem Relation Age of Onset  . Hyperlipidemia Father     Living  . Stroke Father   . Hypertension Father   . Alcohol abuse Mother     Living  . Diabetes Mellitus II Maternal Grandfather   . Hypertension Maternal Grandfather   . Heart failure Maternal Grandfather   . Kidney disease Maternal Grandfather   . Heart disease Maternal Grandmother   . Melanoma Paternal Grandmother   . Heart attack Paternal Grandfather   . Migraines Brother   . Healthy Brother     x2  . Drug abuse Sister     Died of Overdose    BP 131/90 mmHg  Pulse 90  Ht 5\' 10"  (1.778 m)  Wt 195 lb (88.451 kg)  BMI 27.98 kg/m2  Review of Systems: See HPI above.    Objective:  Physical Exam:  Gen: NAD  Left ankle: No swelling, no bruising laterally. No other deformity. FROM No TTP over ATFL, ant ankle joint.  No lateral malleolus, base 5th, medial malleolus, navicular, other tenderness. Trace ant drawer, talar tilt. Negative syndesmotic compression. Thompsons test negative. NV intact distally.    Assessment & Plan:  1. Left ankle sprain - Grade 3.  Clinically healed at this point.  Will return to full duty without restrictions this week.  Home exercises 2-3 times a week next 6 weeks then discontinue.  F/u prn.

## 2014-04-27 ENCOUNTER — Encounter: Payer: Self-pay | Admitting: *Deleted

## 2014-04-28 ENCOUNTER — Ambulatory Visit: Payer: 59 | Admitting: Family Medicine

## 2014-04-28 NOTE — Telephone Encounter (Signed)
New letter written and faxed

## 2014-05-04 ENCOUNTER — Telehealth: Payer: Self-pay | Admitting: *Deleted

## 2014-05-04 NOTE — Telephone Encounter (Signed)
Unable to reach. Will try again.

## 2014-05-05 NOTE — Telephone Encounter (Signed)
LMOVM to return call.

## 2014-05-06 ENCOUNTER — Telehealth: Payer: Self-pay | Admitting: Physician Assistant

## 2014-05-06 ENCOUNTER — Encounter: Payer: 59 | Admitting: Physician Assistant

## 2014-05-06 DIAGNOSIS — Z0289 Encounter for other administrative examinations: Secondary | ICD-10-CM

## 2014-05-06 NOTE — Telephone Encounter (Signed)
Pt stated he was having call trouble and will call back to [facility] CPE

## 2014-05-06 NOTE — Telephone Encounter (Signed)
Thank you for making me aware.  No charge today.

## 2014-05-17 ENCOUNTER — Other Ambulatory Visit: Payer: Self-pay | Admitting: Family Medicine

## 2014-05-17 ENCOUNTER — Other Ambulatory Visit: Payer: Self-pay | Admitting: Physician Assistant

## 2014-06-10 ENCOUNTER — Other Ambulatory Visit: Payer: Self-pay | Admitting: Physician Assistant

## 2014-06-14 ENCOUNTER — Other Ambulatory Visit: Payer: Self-pay | Admitting: Physician Assistant

## 2014-06-17 ENCOUNTER — Other Ambulatory Visit: Payer: Self-pay | Admitting: Physician Assistant

## 2014-06-17 ENCOUNTER — Telehealth: Payer: Self-pay | Admitting: Physician Assistant

## 2014-06-17 MED ORDER — LISINOPRIL 10 MG PO TABS: ORAL_TABLET | ORAL | Status: DC

## 2014-06-17 NOTE — Telephone Encounter (Signed)
Patient scheduled cpe for 06/21/14. Is requesting lisinopril refill to be sent in. [facility] outpatient pharmacy

## 2014-06-17 NOTE — Addendum Note (Signed)
Addended by: Harl Bowie on: 06/17/2014 03:04 PM   Modules accepted: Orders

## 2014-06-17 NOTE — Telephone Encounter (Signed)
Rx sent to the pharmacy by e-script.//AB/CMA 

## 2014-06-21 ENCOUNTER — Telehealth: Payer: Self-pay | Admitting: Physician Assistant

## 2014-06-21 ENCOUNTER — Encounter: Payer: 59 | Admitting: Physician Assistant

## 2014-06-21 ENCOUNTER — Encounter: Payer: Self-pay | Admitting: Physician Assistant

## 2014-06-21 DIAGNOSIS — Z0289 Encounter for other administrative examinations: Secondary | ICD-10-CM

## 2014-06-21 NOTE — Telephone Encounter (Signed)
Pt left voicemail at 8:32am 06/21/14 canceling he's 8am physical appointment. Pt states he will call to [facility] appointment.

## 2014-06-21 NOTE — Telephone Encounter (Signed)
Do not take off of schedule.  He will be charged.

## 2014-06-22 ENCOUNTER — Ambulatory Visit (INDEPENDENT_AMBULATORY_CARE_PROVIDER_SITE_OTHER): Payer: 59 | Admitting: Physician Assistant

## 2014-06-22 ENCOUNTER — Encounter: Payer: Self-pay | Admitting: Physician Assistant

## 2014-06-22 VITALS — BP 146/97 | HR 68 | Temp 98.0°F | Resp 16 | Ht 70.0 in | Wt 206.0 lb

## 2014-06-22 DIAGNOSIS — E782 Mixed hyperlipidemia: Secondary | ICD-10-CM

## 2014-06-22 DIAGNOSIS — Z0000 Encounter for general adult medical examination without abnormal findings: Secondary | ICD-10-CM

## 2014-06-22 DIAGNOSIS — J4530 Mild persistent asthma, uncomplicated: Secondary | ICD-10-CM

## 2014-06-22 DIAGNOSIS — Z23 Encounter for immunization: Secondary | ICD-10-CM

## 2014-06-22 DIAGNOSIS — Z114 Encounter for screening for human immunodeficiency virus [HIV]: Secondary | ICD-10-CM

## 2014-06-22 DIAGNOSIS — D229 Melanocytic nevi, unspecified: Secondary | ICD-10-CM | POA: Insufficient documentation

## 2014-06-22 DIAGNOSIS — F411 Generalized anxiety disorder: Secondary | ICD-10-CM

## 2014-06-22 DIAGNOSIS — D239 Other benign neoplasm of skin, unspecified: Secondary | ICD-10-CM

## 2014-06-22 LAB — COMPREHENSIVE METABOLIC PANEL
ALK PHOS: 61 U/L (ref 39–117)
ALT: 44 U/L (ref 0–53)
AST: 27 U/L (ref 0–37)
Albumin: 4.5 g/dL (ref 3.5–5.2)
BILIRUBIN TOTAL: 0.6 mg/dL (ref 0.2–1.2)
BUN: 16 mg/dL (ref 6–23)
CO2: 28 meq/L (ref 19–32)
CREATININE: 1 mg/dL (ref 0.40–1.50)
Calcium: 9.4 mg/dL (ref 8.4–10.5)
Chloride: 103 mEq/L (ref 96–112)
GFR: 93.95 mL/min (ref >60.00)
GLUCOSE: 95 mg/dL (ref 70–99)
POTASSIUM: 3.6 meq/L (ref 3.5–5.1)
Sodium: 136 mEq/L (ref 135–145)
Total Protein: 6.9 g/dL (ref 6.0–8.3)

## 2014-06-22 LAB — CBC
HCT: 43 % (ref 39.0–52.0)
Hemoglobin: 14.8 g/dL (ref 13.0–17.0)
MCHC: 34.4 g/dL (ref 30.0–36.0)
MCV: 83.6 fl (ref 78.0–100.0)
PLATELETS: 200 10*3/uL (ref 150.0–400.0)
RBC: 5.15 Mil/uL (ref 4.22–5.81)
RDW: 13.1 % (ref 11.5–15.5)
WBC: 4 10*3/uL (ref 4.0–10.5)

## 2014-06-22 LAB — LIPID PANEL
Cholesterol: 246 mg/dL — ABNORMAL HIGH (ref 0–200)
HDL: 38.4 mg/dL — ABNORMAL LOW (ref >39.00)
Total CHOL/HDL Ratio: 6
Triglycerides: 540 mg/dL — ABNORMAL HIGH (ref 0.0–149.0)

## 2014-06-22 LAB — URINALYSIS, ROUTINE W REFLEX MICROSCOPIC
BILIRUBIN URINE: NEGATIVE
HGB URINE DIPSTICK: NEGATIVE
KETONES UR: NEGATIVE
Leukocytes, UA: NEGATIVE
NITRITE: NEGATIVE
RBC / HPF: NONE SEEN (ref 0–?)
SPECIFIC GRAVITY, URINE: 1.01 (ref 1.000–1.030)
Total Protein, Urine: NEGATIVE
URINE GLUCOSE: NEGATIVE
Urobilinogen, UA: 0.2 (ref 0.0–1.0)
WBC UA: NONE SEEN (ref 0–?)
pH: 6 (ref 5.0–8.0)

## 2014-06-22 LAB — LDL CHOLESTEROL, DIRECT: LDL DIRECT: 86 mg/dL

## 2014-06-22 MED ORDER — ALBUTEROL SULFATE HFA 108 (90 BASE) MCG/ACT IN AERS: 2.0000 | INHALATION_SPRAY | Freq: Four times a day (QID) | RESPIRATORY_TRACT | Status: DC | PRN

## 2014-06-22 MED ORDER — MONTELUKAST SODIUM 10 MG PO TABS: 10.0000 mg | ORAL_TABLET | Freq: Every day | ORAL | Status: DC

## 2014-06-22 MED ORDER — BUDESONIDE-FORMOTEROL FUMARATE 80-4.5 MCG/ACT IN AERO: 2.0000 | INHALATION_SPRAY | Freq: Two times a day (BID) | RESPIRATORY_TRACT | Status: DC

## 2014-06-22 MED ORDER — LISINOPRIL 20 MG PO TABS: ORAL_TABLET | ORAL | Status: DC

## 2014-06-22 MED ORDER — ESCITALOPRAM OXALATE 10 MG PO TABS: 10.0000 mg | ORAL_TABLET | Freq: Every day | ORAL | Status: DC

## 2014-06-22 NOTE — Assessment & Plan Note (Signed)
Noted on prior lab work.  Will check fasting lipid panel today.

## 2014-06-22 NOTE — Patient Instructions (Signed)
Please go to the lab for blood work. I will call you with your results. Please continue medications as directed, taking the new dose (20 mg) of your Lisinopril.  Preventive Care for Adults A healthy lifestyle and preventive care can promote health and wellness. Preventive health guidelines for men include the following key practices:  A routine yearly physical is a good way to check with your health care provider about your health and preventative screening. It is a chance to share any concerns and updates on your health and to receive a thorough exam.  Visit your dentist for a routine exam and preventative care every 6 months. Brush your teeth twice a day and floss once a day. Good oral hygiene prevents tooth decay and gum disease.  The frequency of eye exams is based on your age, health, family medical history, use of contact lenses, and other factors. Follow your health care provider's recommendations for frequency of eye exams.  Eat a healthy diet. Foods such as vegetables, fruits, whole grains, low-fat dairy products, and lean protein foods contain the nutrients you need without too many calories. Decrease your intake of foods high in solid fats, added sugars, and salt. Eat the right amount of calories for you.Get information about a proper diet from your health care provider, if necessary.  Regular physical exercise is one of the most important things you can do for your health. Most adults should get at least 150 minutes of moderate-intensity exercise (any activity that increases your heart rate and causes you to sweat) each week. In addition, most adults need muscle-strengthening exercises on 2 or more days a week.  Maintain a healthy weight. The body mass index (BMI) is a screening tool to identify possible weight problems. It provides an estimate of body fat based on height and weight. Your health care provider can find your BMI and can help you achieve or maintain a healthy weight.For  adults 20 years and older:  A BMI below 18.5 is considered underweight.  A BMI of 18.5 to 24.9 is normal.  A BMI of 25 to 29.9 is considered overweight.  A BMI of 30 and above is considered obese.  Maintain normal blood lipids and cholesterol levels by exercising and minimizing your intake of saturated fat. Eat a balanced diet with plenty of fruit and vegetables. Blood tests for lipids and cholesterol should begin at age 48 and be repeated every 5 years. If your lipid or cholesterol levels are high, you are over 50, or you are at high risk for heart disease, you may need your cholesterol levels checked more frequently.Ongoing high lipid and cholesterol levels should be treated with medicines if diet and exercise are not working.  If you smoke, find out from your health care provider how to quit. If you do not use tobacco, do not start.  Lung cancer screening is recommended for adults aged 62-80 years who are at high risk for developing lung cancer because of a history of smoking. A yearly low-dose CT scan of the lungs is recommended for people who have at least a 30-pack-year history of smoking and are a current smoker or have quit within the past 15 years. A pack year of smoking is smoking an average of 1 pack of cigarettes a day for 1 year (for example: 1 pack a day for 30 years or 2 packs a day for 15 years). Yearly screening should continue until the smoker has stopped smoking for at least 15 years. Yearly screening should  be stopped for people who develop a health problem that would prevent them from having lung cancer treatment.  If you choose to drink alcohol, do not have more than 2 drinks per day. One drink is considered to be 12 ounces (355 mL) of beer, 5 ounces (148 mL) of wine, or 1.5 ounces (44 mL) of liquor.  Avoid use of street drugs. Do not share needles with anyone. Ask for help if you need support or instructions about stopping the use of drugs.  High blood pressure causes  heart disease and increases the risk of stroke. Your blood pressure should be checked at least every 1-2 years. Ongoing high blood pressure should be treated with medicines, if weight loss and exercise are not effective.  If you are 75-69 years old, ask your health care provider if you should take aspirin to prevent heart disease.  Diabetes screening involves taking a blood sample to check your fasting blood sugar level. This should be done once every 3 years, after age 33, if you are within normal weight and without risk factors for diabetes. Testing should be considered at a younger age or be carried out more frequently if you are overweight and have at least 1 risk factor for diabetes.  Colorectal cancer can be detected and often prevented. Most routine colorectal cancer screening begins at the age of 25 and continues through age 87. However, your health care provider may recommend screening at an earlier age if you have risk factors for colon cancer. On a yearly basis, your health care provider may provide home test kits to check for hidden blood in the stool. Use of a Onken camera at the end of a tube to directly examine the colon (sigmoidoscopy or colonoscopy) can detect the earliest forms of colorectal cancer. Talk to your health care provider about this at age 1, when routine screening begins. Direct exam of the colon should be repeated every 5-10 years through age 48, unless early forms of precancerous polyps or Krueger growths are found.  People who are at an increased risk for hepatitis B should be screened for this virus. You are considered at high risk for hepatitis B if:  You were born in a country where hepatitis B occurs often. Talk with your health care provider about which countries are considered high risk.  Your parents were born in a high-risk country and you have not received a shot to protect against hepatitis B (hepatitis B vaccine).  You have HIV or AIDS.  You use needles to  inject street drugs.  You live with, or have sex with, someone who has hepatitis B.  You are a man who has sex with other men (MSM).  You get hemodialysis treatment.  You take certain medicines for conditions such as cancer, organ transplantation, and autoimmune conditions.  Hepatitis C blood testing is recommended for all people born from [birth year] through 1965 and any individual with known risks for hepatitis C.  Practice safe sex. Use condoms and avoid high-risk sexual practices to reduce the spread of sexually transmitted infections (STIs). STIs include gonorrhea, chlamydia, syphilis, trichomonas, herpes, HPV, and human immunodeficiency virus (HIV). Herpes, HIV, and HPV are viral illnesses that have no cure. They can result in disability, cancer, and death.  If you are at risk of being infected with HIV, it is recommended that you take a prescription medicine daily to prevent HIV infection. This is called preexposure prophylaxis (PrEP). You are considered at risk if:  You are a  man who has sex with other men (MSM) and have other risk factors.  You are a heterosexual man, are sexually active, and are at increased risk for HIV infection.  You take drugs by injection.  You are sexually active with a partner who has HIV.  Talk with your health care provider about whether you are at high risk of being infected with HIV. If you choose to begin PrEP, you should first be tested for HIV. You should then be tested every 3 months for as long as you are taking PrEP.  A one-time screening for abdominal aortic aneurysm (AAA) and surgical repair of large AAAs by ultrasound are recommended for men ages 80 to 50 years who are current or former smokers.  Healthy men should no longer receive prostate-specific antigen (PSA) blood tests as part of routine cancer screening. Talk with your health care provider about prostate cancer screening.  Testicular cancer screening is not recommended for adult males who  have no symptoms. Screening includes self-exam, a health care provider exam, and other screening tests. Consult with your health care provider about any symptoms you have or any concerns you have about testicular cancer.  Use sunscreen. Apply sunscreen liberally and repeatedly throughout the day. You should seek shade when your shadow is shorter than you. Protect yourself by wearing long sleeves, pants, a wide-brimmed hat, and sunglasses year round, whenever you are outdoors.  Once a month, do a whole-body skin exam, using a mirror to look at the skin on your back. Tell your health care provider about new moles, moles that have irregular borders, moles that are larger than a pencil eraser, or moles that have changed in shape or color.  Stay current with required vaccines (immunizations).  Influenza vaccine. All adults should be immunized every year.  Tetanus, diphtheria, and acellular pertussis (Td, Tdap) vaccine. An adult who has not previously received Tdap or who does not know his vaccine status should receive 1 dose of Tdap. This initial dose should be followed by tetanus and diphtheria toxoids (Td) booster doses every 10 years. Adults with an unknown or incomplete history of completing a 3-dose immunization series with Td-containing vaccines should begin or complete a primary immunization series including a Tdap dose. Adults should receive a Td booster every 10 years.  Varicella vaccine. An adult without evidence of immunity to varicella should receive 2 doses or a second dose if he has previously received 1 dose.  Human papillomavirus (HPV) vaccine. Males aged 59-21 years who have not received the vaccine previously should receive the 3-dose series. Males aged 22-26 years may be immunized. Immunization is recommended through the age of 24 years for any male who has sex with males and did not get any or all doses earlier. Immunization is recommended for any person with an immunocompromised  condition through the age of 25 years if he did not get any or all doses earlier. During the 3-dose series, the second dose should be obtained 4-8 weeks after the first dose. The third dose should be obtained 24 weeks after the first dose and 16 weeks after the second dose.  Zoster vaccine. One dose is recommended for adults aged 74 years or older unless certain conditions are present.  Measles, mumps, and rubella (MMR) vaccine. Adults born before [birth year] generally are considered immune to measles and mumps. Adults born in [birth year] or later should have 1 or more doses of MMR vaccine unless there is a contraindication to the vaccine or there is laboratory  evidence of immunity to each of the three diseases. A routine second dose of MMR vaccine should be obtained at least 28 days after the first dose for students attending postsecondary schools, health care workers, or international travelers. People who received inactivated measles vaccine or an unknown type of measles vaccine during 1963-1967 should receive 2 doses of MMR vaccine. People who received inactivated mumps vaccine or an unknown type of mumps vaccine before 1979 and are at high risk for mumps infection should consider immunization with 2 doses of MMR vaccine. Unvaccinated health care workers born before [birth year] who lack laboratory evidence of measles, mumps, or rubella immunity or laboratory confirmation of disease should consider measles and mumps immunization with 2 doses of MMR vaccine or rubella immunization with 1 dose of MMR vaccine.  Pneumococcal 13-valent conjugate (PCV13) vaccine. When indicated, a person who is uncertain of his immunization history and has no record of immunization should receive the PCV13 vaccine. An adult aged 63 years or older who has certain medical conditions and has not been previously immunized should receive 1 dose of PCV13 vaccine. This PCV13 should be followed with a dose of pneumococcal polysaccharide (PPSV23) vaccine. The  PPSV23 vaccine dose should be obtained at least 8 weeks after the dose of PCV13 vaccine. An adult aged 60 years or older who has certain medical conditions and previously received 1 or more doses of PPSV23 vaccine should receive 1 dose of PCV13. The PCV13 vaccine dose should be obtained 1 or more years after the last PPSV23 vaccine dose.  Pneumococcal polysaccharide (PPSV23) vaccine. When PCV13 is also indicated, PCV13 should be obtained first. All adults aged 77 years and older should be immunized. An adult younger than age 25 years who has certain medical conditions should be immunized. Any person who resides in a nursing home or long-term care facility should be immunized. An adult smoker should be immunized. People with an immunocompromised condition and certain other conditions should receive both PCV13 and PPSV23 vaccines. People with human immunodeficiency virus (HIV) infection should be immunized as soon as possible after diagnosis. Immunization during chemotherapy or radiation therapy should be avoided. Routine use of PPSV23 vaccine is not recommended for American Indians, [place] Natives, or people younger than 65 years unless there are medical conditions that require PPSV23 vaccine. When indicated, people who have unknown immunization and have no record of immunization should receive PPSV23 vaccine. One-time revaccination 5 years after the first dose of PPSV23 is recommended for people aged 19-64 years who have chronic kidney failure, nephrotic syndrome, asplenia, or immunocompromised conditions. People who received 1-2 doses of PPSV23 before age 86 years should receive another dose of PPSV23 vaccine at age 24 years or later if at least 5 years have passed since the previous dose. Doses of PPSV23 are not needed for people immunized with PPSV23 at or after age 34 years.  Meningococcal vaccine. Adults with asplenia or persistent complement component deficiencies should receive 2 doses of quadrivalent  meningococcal conjugate (MenACWY-D) vaccine. The doses should be obtained at least 2 months apart. Microbiologists working with certain meningococcal bacteria, [employer] recruits, people at risk during an outbreak, and people who travel to or live in countries with a high rate of meningitis should be immunized. A first-year college student up through age 57 years who is living in a residence hall should receive a dose if he did not receive a dose on or after his 16th birthday. Adults who have certain high-risk conditions should receive one or more doses  of vaccine.  Hepatitis A vaccine. Adults who wish to be protected from this disease, have certain high-risk conditions, work with hepatitis A-infected animals, work in hepatitis A research labs, or travel to or work in countries with a high rate of hepatitis A should be immunized. Adults who were previously unvaccinated and who anticipate close contact with an international adoptee during the first 60 days after arrival in the [place] from a country with a high rate of hepatitis A should be immunized.  Hepatitis B vaccine. Adults should be immunized if they wish to be protected from this disease, have certain high-risk conditions, may be exposed to blood or other infectious body fluids, are household contacts or sex partners of hepatitis B positive people, are clients or workers in certain care facilities, or travel to or work in countries with a high rate of hepatitis B.  Haemophilus influenzae type b (Hib) vaccine. A previously unvaccinated person with asplenia or sickle cell disease or having a scheduled splenectomy should receive 1 dose of Hib vaccine. Regardless of previous immunization, a recipient of a hematopoietic stem cell transplant should receive a 3-dose series 6-12 months after his successful transplant. Hib vaccine is not recommended for adults with HIV infection. Preventive Service / Frequency Ages 33 to 39  Blood pressure check.** /  Every 1 to 2 years.  Lipid and cholesterol check.** / Every 5 years beginning at age 8.  Hepatitis C blood test.** / For any individual with known risks for hepatitis C.  Skin self-exam. / Monthly.  Influenza vaccine. / Every year.  Tetanus, diphtheria, and acellular pertussis (Tdap, Td) vaccine.** / Consult your health care provider. 1 dose of Td every 10 years.  Varicella vaccine.** / Consult your health care provider.  HPV vaccine. / 3 doses over 6 months, if 48 or younger.  Measles, mumps, rubella (MMR) vaccine.** / You need at least 1 dose of MMR if you were born in 1957 or later. You may also need a second dose.  Pneumococcal 13-valent conjugate (PCV13) vaccine.** / Consult your health care provider.  Pneumococcal polysaccharide (PPSV23) vaccine.** / 1 to 2 doses if you smoke cigarettes or if you have certain conditions.  Meningococcal vaccine.** / 1 dose if you are age 79 to 47 years and a Market researcher living in a residence hall, or have one of several medical conditions. You may also need additional booster doses.  Hepatitis A vaccine.** / Consult your health care provider.  Hepatitis B vaccine.** / Consult your health care provider.  Haemophilus influenzae type b (Hib) vaccine.** / Consult your health care provider. Ages 61 to 81  Blood pressure check.** / Every 1 to 2 years.  Lipid and cholesterol check.** / Every 5 years beginning at age 8.  Lung cancer screening. / Every year if you are aged 66-80 years and have a 30-pack-year history of smoking and currently smoke or have quit within the past 15 years. Yearly screening is stopped once you have quit smoking for at least 15 years or develop a health problem that would prevent you from having lung cancer treatment.  Fecal occult blood test (FOBT) of stool. / Every year beginning at age 47 and continuing until age 2. You may not have to do this test if you get a colonoscopy every 10 years.  Flexible  sigmoidoscopy** or colonoscopy.** / Every 5 years for a flexible sigmoidoscopy or every 10 years for a colonoscopy beginning at age 66 and continuing until age 41.  Hepatitis  C blood test.** / For all people born from [birth year] through 1965 and any individual with known risks for hepatitis C.  Skin self-exam. / Monthly.  Influenza vaccine. / Every year.  Tetanus, diphtheria, and acellular pertussis (Tdap/Td) vaccine.** / Consult your health care provider. 1 dose of Td every 10 years.  Varicella vaccine.** / Consult your health care provider.  Zoster vaccine.** / 1 dose for adults aged 73 years or older.  Measles, mumps, rubella (MMR) vaccine.** / You need at least 1 dose of MMR if you were born in 1957 or later. You may also need a second dose.  Pneumococcal 13-valent conjugate (PCV13) vaccine.** / Consult your health care provider.  Pneumococcal polysaccharide (PPSV23) vaccine.** / 1 to 2 doses if you smoke cigarettes or if you have certain conditions.  Meningococcal vaccine.** / Consult your health care provider.  Hepatitis A vaccine.** / Consult your health care provider.  Hepatitis B vaccine.** / Consult your health care provider.  Haemophilus influenzae type b (Hib) vaccine.** / Consult your health care provider. Ages 59 and over  Blood pressure check.** / Every 1 to 2 years.  Lipid and cholesterol check.**/ Every 5 years beginning at age 81.  Lung cancer screening. / Every year if you are aged 40-80 years and have a 30-pack-year history of smoking and currently smoke or have quit within the past 15 years. Yearly screening is stopped once you have quit smoking for at least 15 years or develop a health problem that would prevent you from having lung cancer treatment.  Fecal occult blood test (FOBT) of stool. / Every year beginning at age 6 and continuing until age 22. You may not have to do this test if you get a colonoscopy every 10 years.  Flexible sigmoidoscopy** or  colonoscopy.** / Every 5 years for a flexible sigmoidoscopy or every 10 years for a colonoscopy beginning at age 47 and continuing until age 19.  Hepatitis C blood test.** / For all people born from [birth year] through 1965 and any individual with known risks for hepatitis C.  Abdominal aortic aneurysm (AAA) screening.** / A one-time screening for ages 14 to 59 years who are current or former smokers.  Skin self-exam. / Monthly.  Influenza vaccine. / Every year.  Tetanus, diphtheria, and acellular pertussis (Tdap/Td) vaccine.** / 1 dose of Td every 10 years.  Varicella vaccine.** / Consult your health care provider.  Zoster vaccine.** / 1 dose for adults aged 59 years or older.  Pneumococcal 13-valent conjugate (PCV13) vaccine.** / Consult your health care provider.  Pneumococcal polysaccharide (PPSV23) vaccine.** / 1 dose for all adults aged 38 years and older.  Meningococcal vaccine.** / Consult your health care provider.  Hepatitis A vaccine.** / Consult your health care provider.  Hepatitis B vaccine.** / Consult your health care provider.  Haemophilus influenzae type b (Hib) vaccine.** / Consult your health care provider. **Family history and personal history of risk and conditions may change your health care provider's recommendations. Document Released: 02/26/2001 Document Revised: 01/05/2013 Document Reviewed: 05/28/2010 [facility] Patient Information 2015 [place], [place]. This information is not intended to replace advice given to you by your health care provider. Make sure you discuss any questions you have with your health care provider.

## 2014-06-22 NOTE — Progress Notes (Signed)
Patient presents to clinic today for annual exam.  Patient is fasting for labs.  Acute Concerns: Patient with family history of melanoma and personal history of SCC.  Has noticed abnormal mole of his chest that has only been present that past couple of months.  Endorses mole is two-toned and irregular.   Chronic Issues: Hypertension.-- Patient taking lisinopril as directed.  Endorses elevated BP despite this. Is trying to watch diet. Patient denies chest pain, palpitations, lightheadedness, dizziness, vision changes or frequent headaches.  Anxiety and Depression -- Patient endorses doing very well on his Lexapro.  Has stopped Valium completely. Denies SI/HI.  Health Maintenance: Dental -- up-to-date Vision -- up-to-date Immunizations -- Due for Tetanus. Will be given today.  Past Medical History  Diagnosis Date  . Asthma   . Meniere's disease   . Environmental and seasonal allergies   . Depression     Resolved  . Chicken pox   . Hyperlipidemia   . Essential hypertension, benign 08/03/2013  . Thoracic outlet syndrome     Past Surgical History  Procedure Laterality Date  . Nevus biopsy      Current Outpatient Prescriptions on File Prior to Visit  Medication Sig Dispense Refill  . cetirizine (ZYRTEC) 10 MG tablet Take 10 mg by mouth daily.    . diphenhydrAMINE (BENADRYL) 25 MG tablet Take 25 mg by mouth every 6 (six) hours as needed for itching or allergies.    . Omega-3 Fatty Acids (FISH OIL) 1000 MG CAPS Take 1 capsule by mouth daily.    . ondansetron (ZOFRAN) 8 MG tablet Take 1 tablet (8 mg total) by mouth every 8 (eight) hours as needed for nausea or vomiting. 20 tablet 0   No current facility-administered medications on file prior to visit.    No Known Allergies  Family History  Problem Relation Age of Onset  . Hyperlipidemia Father     Living  . Stroke Father   . Hypertension Father   . Alcohol abuse Mother     Living  . Diabetes Mellitus II Maternal  Grandfather   . Hypertension Maternal Grandfather   . Heart failure Maternal Grandfather   . Kidney disease Maternal Grandfather   . Heart disease Maternal Grandmother   . Melanoma Paternal Grandmother   . Heart attack Paternal Grandfather   . Migraines Brother   . Healthy Brother     x2  . Drug abuse Sister     Died of Overdose    History   Social History  . Marital Status: Single    Spouse Name: N/A  . Number of Children: N/A  . Years of Education: N/A   Occupational History  . Not on file.   Social History Main Topics  . Smoking status: Never Smoker   . Smokeless tobacco: Never Used  . Alcohol Use: 0.0 oz/week    0 Standard drinks or equivalent per week     Comment: 2-3 beers nightly  . Drug Use: No  . Sexual Activity: Yes    Birth Control/ Protection: None     Comment: male - 1 partner   Other Topics Concern  . Not on file   Social History Narrative   Review of Systems  Constitutional: Negative for fever and weight loss.  HENT: Negative for ear discharge, ear pain, hearing loss and tinnitus.   Eyes: Negative for blurred vision, double vision, photophobia and pain.  Respiratory: Negative for cough and shortness of breath.   Cardiovascular: Negative for  chest pain and palpitations.  Gastrointestinal: Negative for heartburn, nausea, vomiting, abdominal pain, diarrhea, constipation, blood in stool and melena.  Genitourinary: Negative for dysuria, urgency, frequency, hematuria and flank pain.  Musculoskeletal: Negative for falls.  Neurological: Negative for dizziness, loss of consciousness and headaches.  Endo/Heme/Allergies: Negative for environmental allergies.  Psychiatric/Behavioral: Negative for depression, suicidal ideas, hallucinations and substance abuse. The patient is not nervous/anxious and does not have insomnia.    BP 146/97 mmHg  Pulse 68  Temp(Src) 98 F (36.7 C) (Oral)  Resp 16  Ht 5\' 10"  (1.778 m)  Wt 206 lb (93.441 kg)  BMI 29.56 kg/m2   SpO2 98%  Physical Exam  Constitutional: He is oriented to person, place, and time and well-developed, well-nourished, and in no distress.  HENT:  Head: Normocephalic and atraumatic.  Right Ear: External ear normal.  Left Ear: External ear normal.  Nose: Nose normal.  Mouth/Throat: Oropharynx is clear and moist. No oropharyngeal exudate.  Eyes: Conjunctivae and EOM are normal. Pupils are equal, round, and reactive to light.  Neck: Neck supple. No thyromegaly present.  Cardiovascular: Normal rate, regular rhythm, normal heart sounds and intact distal pulses.   Pulmonary/Chest: Effort normal and breath sounds normal. No respiratory distress. He has no wheezes. He has no rales. He exhibits no tenderness.  Abdominal: Soft. Bowel sounds are normal. He exhibits no distension and no mass. There is no tenderness. There is no rebound and no guarding.  Genitourinary: Testes/scrotum normal and penis normal. No discharge found.  Lymphadenopathy:    He has no cervical adenopathy.  Neurological: He is alert and oriented to person, place, and time.  Skin: Skin is warm and dry.     Psychiatric: Affect normal.  Vitals reviewed.  Assessment/Plan: Elevated triglycerides with high cholesterol Noted on prior lab work.  Will check fasting lipid panel today.  Visit for preventive health examination Depression screen negative. TDaP given by nursing staff.  Will obtain fasting labs today. Preventive schedule reviewed and handout given to patient in AVS.  Follow-up will be based on results.  Anxiety state Stable on Lexapro. Will continue current regimen.  Follow-up every 6 months.  Atypical nevi Referral placed to Dermatology for further assessment.

## 2014-06-22 NOTE — Assessment & Plan Note (Signed)
Referral placed to Dermatology for further assessment.

## 2014-06-22 NOTE — Progress Notes (Signed)
Pre visit review using our clinic review tool, if applicable. No additional management support is needed unless otherwise documented below in the visit note/SLS  

## 2014-06-22 NOTE — Assessment & Plan Note (Signed)
Stable on Lexapro. Will continue current regimen.  Follow-up every 6 months.

## 2014-06-22 NOTE — Assessment & Plan Note (Signed)
Depression screen negative. TDaP given by nursing staff.  Will obtain fasting labs today. Preventive schedule reviewed and handout given to patient in AVS.  Follow-up will be based on results.

## 2014-06-22 NOTE — Addendum Note (Signed)
Addended by: Rockwell Germany on: 06/22/2014 02:16 PM   Modules accepted: Orders

## 2014-06-23 ENCOUNTER — Other Ambulatory Visit: Payer: Self-pay | Admitting: Physician Assistant

## 2014-06-23 LAB — HIV ANTIBODY (ROUTINE TESTING W REFLEX): HIV 1&2 Ab, 4th Generation: NONREACTIVE

## 2014-06-28 ENCOUNTER — Telehealth: Payer: Self-pay | Admitting: *Deleted

## 2014-06-28 MED ORDER — FENOFIBRATE 145 MG PO TABS: 145.0000 mg | ORAL_TABLET | Freq: Every day | ORAL | Status: DC

## 2014-06-28 NOTE — Telephone Encounter (Signed)
Called and spoke with the pt and informed him of recent lab results and note below.  Pt verbalized understanding and agreed to started new prescription.  New prescription sent to the pharmacy by e-script.//AB/CMA

## 2014-06-28 NOTE — Telephone Encounter (Signed)
-----   Message from Brunetta Jeans, PA-C sent at 06/23/2014  7:42 AM EDT ----- Labs look great overall.  TGL are still very elevated.  We really need to start a prescription medication to lower these levels. Where he is at currently, increases his risk for pancreatitis and burn out. If he is willing, ok to send in prescription for fenofibrate 145 mcg to take daily. Limit refined sugars and alcohol.  Follow-up in 4-6 weeks.

## 2014-07-21 ENCOUNTER — Telehealth: Payer: Self-pay | Admitting: Behavioral Health

## 2014-07-21 NOTE — Telephone Encounter (Signed)
Spoke with patient regarding the medication, Fenofibrate that he has recently started taking. Patient reported that he took the medication for about a week and noticed that he began to have some leg pain. He wasn't sure if it was a side effect of the medication or if it was in  reference to the cardio-workout he performed 3-4 days that same week. Due to his uncertainty of where the leg pain was coming from, he then stopped working out for a week and only took the medication. Patient verbalized that he continued to have the leg pain after the fact,  along with some abdominal pain as well; however it has subsided, but not completely. Patient states that "the pain is not intolerable, but is  annoying." Advised to go to an urgent care or emergency room if his symptoms worsen. Informing the provider of the patient's concerns.

## 2014-07-22 NOTE — Telephone Encounter (Signed)
Unable to reach patient at time of call.  Left message for patient to return call when available.   

## 2014-07-22 NOTE — Telephone Encounter (Signed)
Spoke with patient and informed him that per his provider, he can attempt to stop the medication over the weekend and see if his symptoms resolve, however if not then he should be seen because it would not be the medication. Patient understood instructions and did not have any further questions or concerns at the end of call.

## 2014-07-22 NOTE — Telephone Encounter (Signed)
Would think if it were a side effect or intolerance to medication he would have pain all over and not one region. He can attempt to stop the fenofibrate over the weekend and see if symptoms resolve. If not, it is not the medication and he should be seen.

## 2014-09-27 ENCOUNTER — Other Ambulatory Visit: Payer: Self-pay | Admitting: Physician Assistant

## 2014-10-08 ENCOUNTER — Encounter (HOSPITAL_BASED_OUTPATIENT_CLINIC_OR_DEPARTMENT_OTHER): Payer: Self-pay | Admitting: *Deleted

## 2014-10-08 ENCOUNTER — Emergency Department (HOSPITAL_BASED_OUTPATIENT_CLINIC_OR_DEPARTMENT_OTHER): Payer: 59

## 2014-10-08 ENCOUNTER — Observation Stay (HOSPITAL_BASED_OUTPATIENT_CLINIC_OR_DEPARTMENT_OTHER)
Admission: EM | Admit: 2014-10-08 | Discharge: 2014-10-09 | Disposition: A | Discharge status: Home / Self Care | Payer: 59 | Attending: Internal Medicine | Admitting: Internal Medicine

## 2014-10-08 DIAGNOSIS — I10 Essential (primary) hypertension: Secondary | ICD-10-CM | POA: Diagnosis not present

## 2014-10-08 DIAGNOSIS — F32A Depression, unspecified: Secondary | ICD-10-CM | POA: Diagnosis present

## 2014-10-08 DIAGNOSIS — R000 Tachycardia, unspecified: Secondary | ICD-10-CM | POA: Insufficient documentation

## 2014-10-08 DIAGNOSIS — Z791 Long term (current) use of non-steroidal anti-inflammatories (NSAID): Secondary | ICD-10-CM | POA: Diagnosis not present

## 2014-10-08 DIAGNOSIS — Z79899 Other long term (current) drug therapy: Secondary | ICD-10-CM | POA: Diagnosis not present

## 2014-10-08 DIAGNOSIS — I471 Supraventricular tachycardia, unspecified: Secondary | ICD-10-CM | POA: Diagnosis present

## 2014-10-08 DIAGNOSIS — R42 Dizziness and giddiness: Secondary | ICD-10-CM | POA: Diagnosis present

## 2014-10-08 DIAGNOSIS — E780 Pure hypercholesterolemia: Secondary | ICD-10-CM | POA: Insufficient documentation

## 2014-10-08 DIAGNOSIS — E781 Pure hyperglyceridemia: Secondary | ICD-10-CM | POA: Diagnosis not present

## 2014-10-08 DIAGNOSIS — G540 Brachial plexus disorders: Secondary | ICD-10-CM | POA: Diagnosis not present

## 2014-10-08 DIAGNOSIS — Z7951 Long term (current) use of inhaled steroids: Secondary | ICD-10-CM | POA: Insufficient documentation

## 2014-10-08 DIAGNOSIS — F329 Major depressive disorder, single episode, unspecified: Secondary | ICD-10-CM | POA: Insufficient documentation

## 2014-10-08 DIAGNOSIS — H8109 Meniere's disease, unspecified ear: Secondary | ICD-10-CM | POA: Diagnosis not present

## 2014-10-08 DIAGNOSIS — E782 Mixed hyperlipidemia: Secondary | ICD-10-CM | POA: Diagnosis present

## 2014-10-08 DIAGNOSIS — J45909 Unspecified asthma, uncomplicated: Secondary | ICD-10-CM | POA: Insufficient documentation

## 2014-10-08 LAB — RAPID URINE DRUG SCREEN, HOSP PERFORMED
Amphetamines: NOT DETECTED
BARBITURATES: NOT DETECTED
Benzodiazepines: NOT DETECTED
COCAINE: NOT DETECTED
Opiates: NOT DETECTED
TETRAHYDROCANNABINOL: NOT DETECTED

## 2014-10-08 LAB — COMPREHENSIVE METABOLIC PANEL
ALBUMIN: 5 g/dL (ref 3.5–5.0)
ALK PHOS: 59 U/L (ref 38–126)
ALT: 39 U/L (ref 17–63)
AST: 33 U/L (ref 15–41)
Anion gap: 11 (ref 5–15)
BUN: 14 mg/dL (ref 6–20)
CALCIUM: 9.8 mg/dL (ref 8.9–10.3)
CHLORIDE: 101 mmol/L (ref 101–111)
CO2: 25 mmol/L (ref 22–32)
CREATININE: 0.95 mg/dL (ref 0.61–1.24)
GFR calc non Af Amer: >60 mL/min (ref >60)
GLUCOSE: 104 mg/dL — AB (ref 65–99)
Potassium: 3.9 mmol/L (ref 3.5–5.1)
SODIUM: 137 mmol/L (ref 135–145)
Total Bilirubin: 0.7 mg/dL (ref 0.3–1.2)
Total Protein: 8.3 g/dL — ABNORMAL HIGH (ref 6.5–8.1)

## 2014-10-08 LAB — DIFFERENTIAL
BASOS ABS: 0.1 10*3/uL (ref 0.0–0.1)
BASOS PCT: 1 %
Eosinophils Absolute: 0.1 10*3/uL (ref 0.0–0.7)
Eosinophils Relative: 3 %
LYMPHS PCT: 29 %
Lymphs Abs: 1.6 10*3/uL (ref 0.7–4.0)
MONO ABS: 0.5 10*3/uL (ref 0.1–1.0)
Monocytes Relative: 10 %
NEUTROS ABS: 3.3 10*3/uL (ref 1.7–7.7)
Neutrophils Relative %: 57 %

## 2014-10-08 LAB — BASIC METABOLIC PANEL
ANION GAP: 8 (ref 5–15)
BUN: 10 mg/dL (ref 6–20)
CHLORIDE: 107 mmol/L (ref 101–111)
CO2: 24 mmol/L (ref 22–32)
Calcium: 8.7 mg/dL — ABNORMAL LOW (ref 8.9–10.3)
Creatinine, Ser: 0.99 mg/dL (ref 0.61–1.24)
GFR calc Af Amer: >60 mL/min (ref >60)
GFR calc non Af Amer: >60 mL/min (ref >60)
GLUCOSE: 123 mg/dL — AB (ref 65–99)
POTASSIUM: 3.7 mmol/L (ref 3.5–5.1)
Sodium: 139 mmol/L (ref 135–145)

## 2014-10-08 LAB — MAGNESIUM: MAGNESIUM: 1.9 mg/dL (ref 1.7–2.4)

## 2014-10-08 LAB — URINALYSIS, ROUTINE W REFLEX MICROSCOPIC
BILIRUBIN URINE: NEGATIVE
Glucose, UA: NEGATIVE mg/dL
Hgb urine dipstick: NEGATIVE
KETONES UR: NEGATIVE mg/dL
LEUKOCYTES UA: NEGATIVE
NITRITE: NEGATIVE
PH: 5.5 (ref 5.0–8.0)
PROTEIN: NEGATIVE mg/dL
Specific Gravity, Urine: 1.009 (ref 1.005–1.030)
UROBILINOGEN UA: 0.2 mg/dL (ref 0.0–1.0)

## 2014-10-08 LAB — ETHANOL: Alcohol, Ethyl (B): <5 mg/dL (ref <5)

## 2014-10-08 LAB — PROTIME-INR
INR: 1.15 (ref 0.00–1.49)
PROTHROMBIN TIME: 14.9 s (ref 11.6–15.2)

## 2014-10-08 LAB — APTT: APTT: 37 s (ref 24–37)

## 2014-10-08 LAB — CBC
HEMATOCRIT: 45.5 % (ref 39.0–52.0)
Hemoglobin: 15.7 g/dL (ref 13.0–17.0)
MCH: 28.2 pg (ref 26.0–34.0)
MCHC: 34.5 g/dL (ref 30.0–36.0)
MCV: 81.8 fL (ref 78.0–100.0)
PLATELETS: 211 10*3/uL (ref 150–400)
RBC: 5.56 MIL/uL (ref 4.22–5.81)
RDW: 13 % (ref 11.5–15.5)
WBC: 5.6 10*3/uL (ref 4.0–10.5)

## 2014-10-08 LAB — TROPONIN I
Troponin I: <0.03 ng/mL (ref <0.031)
Troponin I: <0.03 ng/mL (ref <0.031)

## 2014-10-08 MED ORDER — SODIUM CHLORIDE 0.9 % IV BOLUS (SEPSIS)
1000.0000 mL | Freq: Once | INTRAVENOUS | Status: AC
  Administered 2014-10-08: 1000 mL via INTRAVENOUS

## 2014-10-08 MED ORDER — IOHEXOL 350 MG/ML SOLN
100.0000 mL | Freq: Once | INTRAVENOUS | Status: AC | PRN
  Administered 2014-10-08: 100 mL via INTRAVENOUS

## 2014-10-08 MED ORDER — DIAZEPAM 5 MG/ML IJ SOLN
5.0000 mg | Freq: Once | INTRAMUSCULAR | Status: AC
  Administered 2014-10-08: 5 mg via INTRAVENOUS

## 2014-10-08 MED ORDER — LORAZEPAM 2 MG/ML IJ SOLN: 1.0000 mg | Freq: Once | INTRAMUSCULAR | Status: DC

## 2014-10-08 MED ORDER — SODIUM CHLORIDE 0.9 % IV SOLN
Freq: Once | INTRAVENOUS | Status: AC
  Administered 2014-10-08: 18:00:00 via INTRAVENOUS

## 2014-10-08 MED ORDER — DIAZEPAM 5 MG/ML IJ SOLN
INTRAMUSCULAR | Status: AC
  Filled 2014-10-08: qty 2

## 2014-10-08 MED ORDER — DIAZEPAM 5 MG/ML IJ SOLN
5.0000 mg | Freq: Once | INTRAMUSCULAR | Status: AC
  Administered 2014-10-08: 5 mg via INTRAVENOUS
  Filled 2014-10-08: qty 2

## 2014-10-08 MED ORDER — METOPROLOL TARTRATE 25 MG PO TABS
25.0000 mg | ORAL_TABLET | Freq: Once | ORAL | Status: AC
  Administered 2014-10-08: 25 mg via ORAL
  Filled 2014-10-08: qty 1

## 2014-10-08 NOTE — ED Notes (Signed)
This RN called to patients room. Patient's heart rate 175, patient tachypneic, stating he felt like he was going to "black out again." patient also reports some chest pain that began before he started feeling like he was going to black out. Patient instructed on slow, deep breathing. Heart rate coming down at this time. PA Hess made aware of the situation.

## 2014-10-08 NOTE — ED Provider Notes (Signed)
Patient presenting from [facility] for neurology evaluation. History of Mnire's, presenting with severe dizziness. Workup complete prior to arrival. Negative CTA head and neck, negative head CT. Given 5 mg Valium and a bolus of fluids with minimal relief. Neurology present at bedside shortly after pt's arrival, and states on her standpoint she does not feel his issue is neurological. She believes this is completely related to Mnire's. It is noted patient tachycardic when he arrived to [facility] with a HR of 128, HR 118 on arrival here. Patient denies chest pain, shortness of breath, palpitations. Denies history of PE. Reports a negative PE workup 2 years ago when he had a typical asthma exacerbation presentation. Plan to give another 5 mg Valium along with another 1 L bolus of fluid, reassess heart rate in patient's symptoms, and his vitals improved, will discharge home with a prescription for Valium as requested per Dr. Doy Mince. Patient will need ENT follow-up.  On exam, pt resting comfortably. AAOx3, NAD. PERRLA, EOMi. Heart/Lungs- Tachycardic, regular rhythm. Lungs CVA BL. Neuro- no focal deficits. Dizziness exacerbated with movement.  Patient symptoms had significantly improved with the second dose of Valium and liter fluid. He was able to ambulate without difficulty. Shortly after ambulating, pt had an episode of extreme tachycardia, an EKG was obtained and possible episode of SVT noted, however pt almost immediately went back into sinus. Heart rate improved with Valsalva and carotid massage by Dr. Alfonse Spruce. Plan to consult cardiology.  I spoke with Dr. Wynonia Lawman with cardiology who does not feel that this is cardiology related and suggested admission to hospitalist for observation. I spoke with hospitalist Dr. Olevia Bowens who accepts the patient for admission.  Discussed with attendings Dr. Wyvonnia Dusky initially and Dr. Alfonse Spruce who agrees with plan of care.  Carman Ching, PA-C 10/08/14 2023  Ezequiel Essex, MD 10/09/14 [phone]

## 2014-10-08 NOTE — ED Notes (Signed)
Patient comfortable, family at bedside. Speech clear, resp e/u. Patient reports no needs at this time

## 2014-10-08 NOTE — ED Notes (Signed)
Pt presents with very unsteady gait, vertigo, became ill while at work today, denies any vomiting, nausea present.

## 2014-10-08 NOTE — ED Provider Notes (Signed)
CSN: [id number]     Arrival date & time 10/08/14  1204 History   First MD Initiated Contact with Patient 10/08/14 1210     Chief Complaint  Patient presents with  . Dizziness     (Consider location/radiation/quality/duration/timing/severity/associated sxs/prior Treatment) HPI Comments: Patient with history of Mnire's disease, hypertension, high cholesterol, family history of stroke -- presents with complaint of acute onset of vertigo and nausea starting at approximately 10:30 AM. Prior to this he has been extremely fatigued for the past 6 days and has been sleeping much more than usual. Patient has had a difficult time keeping his balance when walking and has been leaning to the left. He also describes objects and images moving in and out and slanting to the left when he opens his eyes. Symptoms are similar to Mnire's attack however the vision aspect is more severe. Patient typically states that he gets acute onset of severe vertigo with Mnire's, last attack was approximate 4 years ago. He also has had left arm heaviness and tingling in the past with Mnire's flare. He describes a heaviness in his left arm at this time. Patient is tachycardic but denies chest pain or shortness of breath. No headache or ear pain. No weakness, numbness, or tingling in legs. The onset of this condition was acute. The course is constant. Aggravating factors: movement, opening eyes. Alleviating factors: none.    Patient is a 29 y.o. male presenting with dizziness. The history is provided by the patient and medical records.  Dizziness Associated symptoms: nausea and weakness (L arm feels 'tired')   Associated symptoms: no chest pain, no headaches, no shortness of breath and no vomiting     Past Medical History  Diagnosis Date  . Asthma   . Meniere's disease   . Environmental and seasonal allergies   . Depression     Resolved  . Chicken pox   . Hyperlipidemia   . Essential hypertension, benign 08/03/2013   . Thoracic outlet syndrome    Past Surgical History  Procedure Laterality Date  . Nevus biopsy     Family History  Problem Relation Age of Onset  . Hyperlipidemia Father     Living  . Stroke Father   . Hypertension Father   . Alcohol abuse Mother     Living  . Diabetes Mellitus II Maternal Grandfather   . Hypertension Maternal Grandfather   . Heart failure Maternal Grandfather   . Kidney disease Maternal Grandfather   . Heart disease Maternal Grandmother   . Melanoma Paternal Grandmother   . Heart attack Paternal Grandfather   . Migraines Brother   . Healthy Brother     x2  . Drug abuse Sister     Died of Overdose   Social History  Substance Use Topics  . Smoking status: Never Smoker   . Smokeless tobacco: Never Used  . Alcohol Use: 0.0 oz/week    0 Standard drinks or equivalent per week     Comment: 2-3 beers nightly    Review of Systems  Constitutional: Positive for fatigue. Negative for fever.  HENT: Negative for congestion, dental problem, ear discharge, ear pain, rhinorrhea and sinus pressure.   Eyes: Positive for visual disturbance. Negative for photophobia, discharge and redness.  Respiratory: Negative for shortness of breath.   Cardiovascular: Negative for chest pain.  Gastrointestinal: Positive for nausea. Negative for vomiting.  Musculoskeletal: Positive for gait problem. Negative for neck pain and neck stiffness.  Skin: Negative for rash.  Neurological: Positive for  dizziness and weakness (L arm feels 'tired'). Negative for syncope, speech difficulty, light-headedness, numbness and headaches.  Psychiatric/Behavioral: Negative for confusion.      Allergies  Review of patient's allergies indicates no known allergies.  Home Medications   Prior to Admission medications   Medication Sig Start Date End Date Taking? Authorizing Provider  albuterol (PROVENTIL HFA;VENTOLIN HFA) 108 (90 BASE) MCG/ACT inhaler Inhale 2 puffs into the lungs every 6 (six)  hours as needed for wheezing. 06/22/14   Brunetta Jeans, PA-C  budesonide-formoterol (SYMBICORT) 80-4.5 MCG/ACT inhaler Inhale 2 puffs into the lungs 2 (two) times daily. 06/22/14   Brunetta Jeans, PA-C  cetirizine (ZYRTEC) 10 MG tablet Take 10 mg by mouth daily.    Historical Provider, MD  diphenhydrAMINE (BENADRYL) 25 MG tablet Take 25 mg by mouth every 6 (six) hours as needed for itching or allergies.    Historical Provider, MD  escitalopram (LEXAPRO) 10 MG tablet Take 1 tablet (10 mg total) by mouth daily. 06/22/14   Brunetta Jeans, PA-C  fenofibrate (TRICOR) 145 MG tablet TAKE 1 TABLET (145 MG TOTAL) BY MOUTH DAILY. 09/27/14   Brunetta Jeans, PA-C  ibuprofen (ADVIL,MOTRIN) 200 MG tablet Take 200 mg by mouth every 8 (eight) hours as needed.    Historical Provider, MD  lisinopril (PRINIVIL,ZESTRIL) 20 MG tablet Take 1 tablet by mouth daily. 06/22/14   Brunetta Jeans, PA-C  montelukast (SINGULAIR) 10 MG tablet Take 1 tablet (10 mg total) by mouth at bedtime. 06/22/14   Brunetta Jeans, PA-C  Omega-3 Fatty Acids (FISH OIL) 1000 MG CAPS Take 1 capsule by mouth daily.    Historical Provider, MD  ondansetron (ZOFRAN) 8 MG tablet Take 1 tablet (8 mg total) by mouth every 8 (eight) hours as needed for nausea or vomiting. 03/22/13   Brunetta Jeans, PA-C   BP 148/79 mmHg  Pulse 128  Temp(Src) 98.4 F (36.9 C) (Oral)  Resp 22  Ht 5\' 10"  (1.778 m)  Wt 190 lb (86.183 kg)  BMI 27.26 kg/m2  SpO2 100% Physical Exam  Constitutional: He is oriented to person, place, and time. He appears well-developed and well-nourished.  HENT:  Head: Normocephalic and atraumatic.  Right Ear: Tympanic membrane, external ear and ear canal normal.  Left Ear: Tympanic membrane, external ear and ear canal normal.  Nose: Nose normal.  Mouth/Throat: Uvula is midline, oropharynx is clear and moist and mucous membranes are normal.  Eyes: Conjunctivae, EOM and lids are normal. Pupils are equal, round, and reactive to light.   No horizontal or vertical nystagmus easily exhibited. Patient has difficulty tracking finger due to severe vertigo and nausea.  Neck: Normal range of motion. Neck supple. Carotid bruit is not present.  Cardiovascular: Normal rate and regular rhythm.   Pulmonary/Chest: Effort normal and breath sounds normal. No respiratory distress. He has no wheezes. He has no rales.  Abdominal: Soft. There is no tenderness.  Musculoskeletal: Normal range of motion.       Cervical back: He exhibits normal range of motion, no tenderness and no bony tenderness.  Neurological: He is alert and oriented to person, place, and time. He has normal strength and normal reflexes. A sensory deficit (states dull sensation in left upper extremity.) is present. No cranial nerve deficit. He exhibits normal muscle tone. He displays a negative Romberg sign. Gait abnormal. Coordination normal. GCS eye subscore is 4. GCS verbal subscore is 5. GCS motor subscore is 6.  Negative cover/uncover. Patient able to do  finger to nose but is slow with this. No dysmetria.  Skin: Skin is warm and dry.  Psychiatric: He has a normal mood and affect.  Nursing note and vitals reviewed.   ED Course  Procedures (including critical care time) Labs Review Labs Reviewed  COMPREHENSIVE METABOLIC PANEL - Abnormal; Notable for the following:    Glucose, Bld 104 (*)    Total Protein 8.3 (*)    All other components within normal limits  ETHANOL  PROTIME-INR  APTT  CBC  DIFFERENTIAL  URINE RAPID DRUG SCREEN, HOSP PERFORMED  URINALYSIS, ROUTINE W REFLEX MICROSCOPIC (NOT AT [facility])  TROPONIN I    Imaging Review Ct Head Wo Contrast  10/08/2014   CLINICAL DATA:  29 year old male with dizziness for 4 hours, unsteady gait, abnormal sensation left upper extremity. Code stroke. Initial encounter.  EXAM: CT HEAD WITHOUT CONTRAST  TECHNIQUE: Contiguous axial images were obtained from the base of the skull through the vertex without intravenous contrast.   COMPARISON:  02/17/2013 noncontrast head CT.  FINDINGS: No acute osseous abnormality identified. Visualized paranasal sinuses and mastoids are clear. No acute orbit or scalp soft tissue findings.  Cerebral volume remains normal. Gray-white matter differentiation is within normal limits throughout the brain. No cortically based acute infarct identified. No suspicious intracranial vascular hyperdensity. No acute intracranial hemorrhage identified. No ventriculomegaly. No midline shift, mass effect, or evidence of intracranial mass lesion.  IMPRESSION: Stable and normal noncontrast CT appearance of the brain.  Study discussed by telephone with Dr. Colin Rhein On 10/08/2014 at 14:03 .   Electronically Signed   By: Genevie Ann M.D.   On: 10/08/2014 14:04   I have personally reviewed and evaluated these images and lab results as part of my medical decision-making.   EKG Interpretation   Date/Time:  Saturday October 08 2014 12:20:56 EDT Ventricular Rate:  122 PR Interval:  134 QRS Duration: 100 QT Interval:  326 QTC Calculation: 464 R Axis:   -37 Text Interpretation:  Sinus tachycardia Possible Left atrial enlargement  Left axis deviation Incomplete right bundle branch block Possible Lateral  infarct , age undetermined Abnormal ECG SINCE LAST TRACING HEART RATE HAS  INCREASED Confirmed by Debby Freiberg [id number]) on 10/08/2014 1:05:56 PM       1:08 PM Patient seen and examined. Work-up initiated. Medications ordered. Fluid ordered. Discussed with Dr. Colin Rhein who will see. Symptom onset 2.5 hrs ago.   Vital signs reviewed and are as follows: BP 148/79 mmHg  Pulse 128  Temp(Src) 98.4 F (36.9 C) (Oral)  Resp 22  Ht 5\' 10"  (1.778 m)  Wt 190 lb (86.183 kg)  BMI 27.26 kg/m2  SpO2 100%  1:20 PM Dr. Colin Rhein has seen. Plan: labs, IV valium, fluids. Will recheck shortly after medication administration. If not improved, will consult neuro for how to proceed. Will hold code stroke at this time due to  similarities to previous Mnire's flares that usually responds to benzodiazepine's. Patient has no extremity weakness, speech difficulty, headache to suggest ischemic or hemorrhagic stroke.  If patient does not improve, will need to proceed with evaluation for posterior circulation stroke.  1:44 PM Patient did not rapidly improve. Continues to be ataxic. Cannot walk or stand. Code stroke called by Dr. Colin Rhein. Head CT ordered.   2:11 PM Labs unrevealing. CT normal. Patient discussed with Dr. Doy Mince by Dr. Colin Rhein. Plan is to transfer to [facility] for eval by neurologist.   Transfer to [facility]. CT angio prior to transport.   MDM   Final  diagnoses:  Vertigo   Transfer to [facility]. Rule-out posterior circulation CVA.     Carlisle Cater, PA-C 10/08/14 1516  Debby Freiberg, MD 10/09/14 [phone]

## 2014-10-08 NOTE — ED Notes (Signed)
Returns from CT scan, immediately placed back on cont cardiac monitoring with cont POX monitoring, VS reassessed, neuro status reassesed

## 2014-10-08 NOTE — ED Notes (Signed)
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

## 2014-10-08 NOTE — ED Notes (Signed)
Attempted report X2 

## 2014-10-08 NOTE — ED Notes (Signed)
Patient alert and oriented, speech clear. Patient talking to RN's and EDT with no difficulty.

## 2014-10-08 NOTE — ED Notes (Signed)
Pt reports visual distortion but an improvement in blurriness and nausea. Connye Burkitt, RN

## 2014-10-08 NOTE — ED Notes (Signed)
Patient transported to CT 

## 2014-10-08 NOTE — ED Notes (Signed)
Patients heart rate down to 123, patient talking, resp e/u. Patient alert and oriented, stating he is feeling better.

## 2014-10-08 NOTE — ED Notes (Signed)
MD at bedside. 

## 2014-10-08 NOTE — H&P (Signed)
[facility] History and Physical  JARIAN LONGORIA MWU:[id number] DOB: 04-04-1985 DOA: 10/08/2014  Referring physician:  PCP: Leeanne Rio, PA-C   Chief Complaint:  Dizziness  HPI: ORDEAN FOUTS is a 29 y.o. male with a past medical history of Mnire's disease, hypertension, hyperlipidemia, asthma, depression, seasonal allergies, thoracic outlet syndrome who initially was seen at [facility] due to dizziness, nausea, unsteady balance, left arm numbness, vision disturbances which the patient describes as things zooming in his field of vision and fatigue for the last 6 days. The patient stated that his symptoms are similar to his Mnire's disease acute episodes, except for the arm numbness on the left. He is case the last time that he had symptoms from his Mnire's prior to today was about 4 years ago.   The patient was initially tachycardic in the 110s and 120s. He received IV fluids and 5 mg of Valium at [facility] and was transferred to [facility] for evaluation by neurology. He will receive another normal saline bolus of 1000 mL and a repeat Valium 5 mg dose. Workup was benign and neurology felt that the patient could go home. However, during the process of discharge the patient became very tachycardic, up in the 180s and 190s, as per the emergency department medical staff. This subsequently subsided on its own, but the patient continued to have sinus tachycardia. He states that he noticed several days ago that he was tachycardic while he was measuring his blood pressure. He denies chest pain, dyspnea, lower extremity edema, PND or orthopnea during this episode. However he had a positive palpitations, nausea, feeling flushed and mild diaphoresis. He is currently in no acute distress.   Review of Systems:  Constitutional:  No weight loss, night sweats, Fevers, chills, fatigue.  HEENT:  Positive headache No , Difficulty swallowing,Tooth/dental problems,Sore throat,  No  sneezing, itching, ear ache, nasal congestion, post nasal drip,  Cardio-vascular:  As above mentioned.  GI:  Positive for nausea No heartburn, indigestion, abdominal pain, , vomiting, diarrhea, change in bowel habits, loss of appetite  Resp:  No shortness of breath with exertion or at rest. No excess mucus, no productive cough, No non-productive cough, No coughing up of blood.No change in color of mucus.No wheezing.No chest wall deformity  Skin:  no rash or lesions.  GU:  no dysuria, change in color of urine, no urgency or frequency. No flank pain.  Musculoskeletal:  No joint pain or swelling. No decreased range of motion. No back pain.  Psych:  No change in mood or affect. No depression or anxiety. No memory loss.  Neuro: As above mentioned.  Past Medical History  Diagnosis Date  . Asthma   . Meniere's disease   . Environmental and seasonal allergies   . Depression     Resolved  . Chicken pox   . Hyperlipidemia   . Essential hypertension, benign 08/03/2013  . Thoracic outlet syndrome    Past Surgical History  Procedure Laterality Date  . Nevus biopsy     Social History:  reports that he has never smoked. He has never used smokeless tobacco. He reports that he drinks alcohol. He reports that he does not use illicit drugs.  No Known Allergies  Family History  Problem Relation Age of Onset  . Hyperlipidemia Father     Living  . Stroke Father   . Hypertension Father   . Alcohol abuse Mother     Living  . Diabetes Mellitus II Maternal  Grandfather   . Hypertension Maternal Grandfather   . Heart failure Maternal Grandfather   . Kidney disease Maternal Grandfather   . Heart disease Maternal Grandmother   . Melanoma Paternal Grandmother   . Heart attack Paternal Grandfather   . Migraines Brother   . Healthy Brother     x2  . Drug abuse Sister     Died of Overdose    Prior to Admission medications   Medication Sig Start Date End Date Taking? Authorizing Provider    albuterol (PROVENTIL HFA;VENTOLIN HFA) 108 (90 BASE) MCG/ACT inhaler Inhale 2 puffs into the lungs every 6 (six) hours as needed for wheezing. 06/22/14  Yes Brunetta Jeans, PA-C  budesonide-formoterol (SYMBICORT) 80-4.5 MCG/ACT inhaler Inhale 2 puffs into the lungs 2 (two) times daily. Patient taking differently: Inhale 2 puffs into the lungs at bedtime.  06/22/14  Yes Brunetta Jeans, PA-C  cetirizine (ZYRTEC) 10 MG tablet Take 10 mg by mouth at bedtime.    Yes Historical Provider, MD  cyclobenzaprine (FLEXERIL) 5 MG tablet Take 5 mg by mouth 3 (three) times daily as needed for muscle spasms.   Yes Historical Provider, MD  diphenhydrAMINE (BENADRYL) 25 MG tablet Take 25 mg by mouth every 6 (six) hours as needed for itching or allergies.   Yes Historical Provider, MD  escitalopram (LEXAPRO) 10 MG tablet Take 1 tablet (10 mg total) by mouth daily. Patient taking differently: Take 10 mg by mouth at bedtime.  06/22/14  Yes Brunetta Jeans, PA-C  fenofibrate (TRICOR) 145 MG tablet TAKE 1 TABLET (145 MG TOTAL) BY MOUTH DAILY. Patient taking differently: TAKE 1 TABLET (145 MG TOTAL) BY MOUTH DAILY AT BEDTIME 09/27/14  Yes Brunetta Jeans, PA-C  ibuprofen (ADVIL,MOTRIN) 200 MG tablet Take 400 mg by mouth every 8 (eight) hours as needed for mild pain or moderate pain.    Yes Historical Provider, MD  lisinopril (PRINIVIL,ZESTRIL) 20 MG tablet Take 1 tablet by mouth daily. Patient taking differently: Take 20 mg by mouth at bedtime. Take 1 tablet by mouth daily. 06/22/14  Yes Brunetta Jeans, PA-C  montelukast (SINGULAIR) 10 MG tablet Take 1 tablet (10 mg total) by mouth at bedtime. 06/22/14  Yes Brunetta Jeans, PA-C  Multiple Vitamin (MULTIVITAMIN WITH MINERALS) TABS tablet Take 1 tablet by mouth daily.   Yes Historical Provider, MD  Omega-3 Fatty Acids (FISH OIL) 1000 MG CAPS Take 1 capsule by mouth daily.   Yes Historical Provider, MD  ondansetron (ZOFRAN) 8 MG tablet Take 1 tablet (8 mg total) by mouth every  8 (eight) hours as needed for nausea or vomiting. Patient not taking: Reported on 10/08/2014 03/22/13   Brunetta Jeans, PA-C   Physical Exam: Filed Vitals:   10/08/14 2000 10/08/14 2030 10/08/14 2045 10/08/14 2100  BP: 132/93 135/98 148/95 131/100  Pulse: 120 109 120 116  Temp:      TempSrc:      Resp: 24 26 14 21   Height:      Weight:      SpO2: 99% 97% 99% 98%    Wt Readings from Last 3 Encounters:  10/08/14 86.183 kg (190 lb)  06/22/14 93.441 kg (206 lb)  04/25/14 88.451 kg (195 lb)    General:  Appears calm and comfortable Eyes: PERRL, normal lids, irises & conjunctiva ENT: grossly normal hearing, lips & tongue, normal ear canal and tympanic membrane bilaterally. Neck: no LAD, masses or thyromegaly Cardiovascular: Tachycardic at about 116 bpm, no m/r/g. No LE  edema. Telemetry: Sinus tachycardia at 120 bpm Respiratory: CTA bilaterally, no w/r/r. Normal respiratory effort. Abdomen: soft, ntnd Skin: no rash or induration seen on limited exam Musculoskeletal: grossly normal tone BUE/BLE Psychiatric: grossly normal mood and affect, speech fluent and appropriate Neurologic: grossly non-focal.          Labs on Admission:  Basic Metabolic Panel:  Recent Labs Lab 10/08/14 1320  NA 137  K 3.9  CL 101  CO2 25  GLUCOSE 104*  BUN 14  CREATININE 0.95  CALCIUM 9.8   Liver Function Tests:  Recent Labs Lab 10/08/14 1320  AST 33  ALT 39  ALKPHOS 59  BILITOT 0.7  PROT 8.3*  ALBUMIN 5.0   CBC:  Recent Labs Lab 10/08/14 1320  WBC 5.6  NEUTROABS 3.3  HGB 15.7  HCT 45.5  MCV 81.8  PLT 211   Cardiac Enzymes:  Recent Labs Lab 10/08/14 1320  TROPONINI <0.03    Radiological Exams on Admission: Ct Angio Head W/cm &/or Wo Cm  10/08/2014   CLINICAL DATA:  29 year old male code stroke. Vertigo. Initial encounter.  EXAM: CT ANGIOGRAPHY HEAD AND NECK  TECHNIQUE: Multidetector CT imaging of the head and neck was performed using the standard protocol during bolus  administration of intravenous contrast. Multiplanar CT image reconstructions and MIPs were obtained to evaluate the vascular anatomy. Carotid stenosis measurements (when applicable) are obtained utilizing NASCET criteria, using the distal internal carotid diameter as the denominator.  CONTRAST:  100 mL Omnipaque 350.  COMPARISON:  Noncontrast head CT 1403 hours today.  FINDINGS: CTA NECK  Skeleton: Carious right posterior maxillary dentition (series 11, image 64). Cervicothoracic scoliosis. No acute osseous abnormality identified. Mild right maxillary sinus mucosal thickening or Melman mucous retention cysts.  Other neck: Negative lung apices. No superior mediastinal lymphadenopathy.  Thyroid, larynx, pharynx, parapharyngeal spaces, retropharyngeal space, sublingual space, submandibular glands, parotid glands, orbit and scalp soft tissues are within normal limits. Tympanic cavities and mastoids appear clear. No cervical lymphadenopathy.  Aortic arch: Slight bovine arch configuration. No arch atherosclerosis. No great vessel origin stenosis.  Right carotid system: Negative.  Left carotid system: Negative.  Vertebral arteries:Normal proximal subclavian arteries. Normal vertebral artery origins.  Codominant vertebral arteries appear normal to the skullbase.  There is some paravertebral venous contrast contamination contamination at the skullbase.  CTA HEAD  Posterior circulation: Codominant distal vertebral arteries with normal PICA origins. Patent vertebrobasilar junction. Patent basilar artery without stenosis. Normal SCA and left PCA origins. Fetal type right PCA origin. Left posterior communicating artery diminutive or absent. Bilateral PCA branches within normal limits.  Anterior circulation: Patent ICA siphons. No siphon plaque or stenosis. Ophthalmic and right posterior communicating artery origins are normal. Normal carotid termini, MCA and ACA origins. Anterior communicating artery and bilateral ACA branches  are normal. Left MCA M1 segment, bifurcation and left MCA branches are within normal limits. Right MCA M1 segment, bifurcation, and right MCA branches are within normal limits.  Venous sinuses: Patent.  Anatomic variants: Fetal type right PCA origin.  Delayed phase: No abnormal enhancement identified. Stable and normal gray-white matter differentiation.  IMPRESSION: 1. Negative CTA head and neck. 2. Stable, normal CT appearance of the brain. 3. Carious posterior right maxillary dentition.   Electronically Signed   By: Genevie Ann M.D.   On: 10/08/2014 15:39   Ct Head Wo Contrast  10/08/2014   CLINICAL DATA:  29 year old male with dizziness for 4 hours, unsteady gait, abnormal sensation left upper extremity. Code stroke. Initial encounter.  EXAM: CT HEAD WITHOUT CONTRAST  TECHNIQUE: Contiguous axial images were obtained from the base of the skull through the vertex without intravenous contrast.  COMPARISON:  02/17/2013 noncontrast head CT.  FINDINGS: No acute osseous abnormality identified. Visualized paranasal sinuses and mastoids are clear. No acute orbit or scalp soft tissue findings.  Cerebral volume remains normal. Gray-white matter differentiation is within normal limits throughout the brain. No cortically based acute infarct identified. No suspicious intracranial vascular hyperdensity. No acute intracranial hemorrhage identified. No ventriculomegaly. No midline shift, mass effect, or evidence of intracranial mass lesion.  IMPRESSION: Stable and normal noncontrast CT appearance of the brain.  Study discussed by telephone with Dr. Colin Rhein On 10/08/2014 at 14:03 .   Electronically Signed   By: Genevie Ann M.D.   On: 10/08/2014 14:04   Ct Angio Neck W/cm &/or Wo/cm  10/08/2014   CLINICAL DATA:  29 year old male code stroke. Vertigo. Initial encounter.  EXAM: CT ANGIOGRAPHY HEAD AND NECK  TECHNIQUE: Multidetector CT imaging of the head and neck was performed using the standard protocol during bolus administration of  intravenous contrast. Multiplanar CT image reconstructions and MIPs were obtained to evaluate the vascular anatomy. Carotid stenosis measurements (when applicable) are obtained utilizing NASCET criteria, using the distal internal carotid diameter as the denominator.  CONTRAST:  100 mL Omnipaque 350.  COMPARISON:  Noncontrast head CT 1403 hours today.  FINDINGS: CTA NECK  Skeleton: Carious right posterior maxillary dentition (series 11, image 64). Cervicothoracic scoliosis. No acute osseous abnormality identified. Mild right maxillary sinus mucosal thickening or Pondexter mucous retention cysts.  Other neck: Negative lung apices. No superior mediastinal lymphadenopathy.  Thyroid, larynx, pharynx, parapharyngeal spaces, retropharyngeal space, sublingual space, submandibular glands, parotid glands, orbit and scalp soft tissues are within normal limits. Tympanic cavities and mastoids appear clear. No cervical lymphadenopathy.  Aortic arch: Slight bovine arch configuration. No arch atherosclerosis. No great vessel origin stenosis.  Right carotid system: Negative.  Left carotid system: Negative.  Vertebral arteries:Normal proximal subclavian arteries. Normal vertebral artery origins.  Codominant vertebral arteries appear normal to the skullbase.  There is some paravertebral venous contrast contamination contamination at the skullbase.  CTA HEAD  Posterior circulation: Codominant distal vertebral arteries with normal PICA origins. Patent vertebrobasilar junction. Patent basilar artery without stenosis. Normal SCA and left PCA origins. Fetal type right PCA origin. Left posterior communicating artery diminutive or absent. Bilateral PCA branches within normal limits.  Anterior circulation: Patent ICA siphons. No siphon plaque or stenosis. Ophthalmic and right posterior communicating artery origins are normal. Normal carotid termini, MCA and ACA origins. Anterior communicating artery and bilateral ACA branches are normal. Left MCA  M1 segment, bifurcation and left MCA branches are within normal limits. Right MCA M1 segment, bifurcation, and right MCA branches are within normal limits.  Venous sinuses: Patent.  Anatomic variants: Fetal type right PCA origin.  Delayed phase: No abnormal enhancement identified. Stable and normal gray-white matter differentiation.  IMPRESSION: 1. Negative CTA head and neck. 2. Stable, normal CT appearance of the brain. 3. Carious posterior right maxillary dentition.   Electronically Signed   By: Genevie Ann M.D.   On: 10/08/2014 15:39    EKG: Independently reviewed. Vent. rate 114 BPM PR interval 137 ms QRS duration 103 ms QT/QTc 360/496 ms P-R-T axes 62 -38 46  Sinus tachycardia Incomplete RBBB and LAFB Low voltage, precordial leads RSR' in V1 or V2, probably normal variant Prolonged QT interval  Assessment/Plan Principal Problem:   SVT (supraventricular tachycardia)  1) Admit  to telemetry for cardiac monitoring. 2) Serial troponin levels trending. 3) Continue low-dose metoprolol. 4) Optimize potassium level. 5) Check magnesium level and replace if necessary. 6) Check echocardiogram.  Active Problems:   Intrinsic asthma The patient states that his asthma is well controlled with Symbicort, Singulair and Zyrtec. He has an albuterol MDI as well which is use on a when necessary basis. Continue Symbicort, but hold when necessary albuterol.  Xopenex when necessary for acute symptoms. If tachycardia persists, after low-dose metoprolol and electrolyte replacement, consider switching to inhaled steroids only.    Meniere disease Continue IV hydration and when necessary diazepam. Per patient, meclizine does not work well on his symptoms.    Essential hypertension, benign Continue lisinopril and monitor blood pressure. May need to adjust the dosage of these ACE inhibitor if starting the patient on metoprolol.    Elevated triglycerides with high cholesterol Continue fenofibrate. Check  lipid panel in the morning.    Depression Continue Lexapro.   Dr. Shary Key from the neuro hospitalists service evaluated the patient in the emergency department.  Cardiology was called by the emergency department, but they felt that the patient to be managed with admission to telemetry and troponin level trending.  Code Status: Full code. DVT Prophylaxis:Lovenox SQ. Family Communication:  Disposition Plan: Admit for telemetry monitoring and serial troponin levels measurements.   Time spent: Over 70 minutes were spent during this admission.  Reubin Milan [facility] Pager [phone]

## 2014-10-08 NOTE — ED Notes (Signed)
Dr. Reynolds at bedside.

## 2014-10-08 NOTE — ED Notes (Addendum)
Pt reports he started feeling like his heart was racing, had his family member pull the code blue because he felt like he was going to "black out." Dr. Alfonse Spruce in room, crash cart to bedside and patient placed on zoll. Patients heart rate up to 190's, patient sob and pale.

## 2014-10-08 NOTE — ED Notes (Signed)
Placed on cardiac monitor, 12 lead EKG done, to EDP for review

## 2014-10-08 NOTE — ED Notes (Signed)
Presents with dizziness and nausea

## 2014-10-08 NOTE — ED Notes (Signed)
Patient arrived to [facility] via [facility]. 

## 2014-10-08 NOTE — Consult Note (Addendum)
Referring Physician: Alfonse Spruce    Chief Complaint: Dizziness  HPI: Steve Jacobs is an 29 y.o. male with history of Mnire's disease, hypertension, high cholesterol, family history of stroke -- presents with complaint of acute onset of vertigo and nausea starting at approximately 10:30 AM. Prior to this he has been extremely fatigued for the past 6 days and has been sleeping much more than usual. Patient has had a difficult time keeping his balance when walking and has been leaning to the left. He also describes objects and images moving in and out and slanting to the left when he opens his eyes. Symptoms are similar to Mnire's attack however the vision aspect is more severe. Patient typically states that he gets acute onset of severe vertigo with Mnire's, last attack was approximate 4 years ago. He also has had left arm heaviness and tingling in the past with Mnire's flare.  Has been treated with injections in his ear, etc. in the past.  Date last known well: Date: 10/08/2014 Time last known well: Time: 10:30 tPA Given: No: Not felt to be a stroke  Past Medical History  Diagnosis Date  . Asthma   . Meniere's disease   . Environmental and seasonal allergies   . Depression     Resolved  . Chicken pox   . Hyperlipidemia   . Essential hypertension, benign 08/03/2013  . Thoracic outlet syndrome     Past Surgical History  Procedure Laterality Date  . Nevus biopsy      Family History  Problem Relation Age of Onset  . Hyperlipidemia Father     Living  . Stroke Father   . Hypertension Father   . Alcohol abuse Mother     Living  . Diabetes Mellitus II Maternal Grandfather   . Hypertension Maternal Grandfather   . Heart failure Maternal Grandfather   . Kidney disease Maternal Grandfather   . Heart disease Maternal Grandmother   . Melanoma Paternal Grandmother   . Heart attack Paternal Grandfather   . Migraines Brother   . Healthy Brother     x2  . Drug abuse Sister     Died  of Overdose   Social History:  reports that he has never smoked. He has never used smokeless tobacco. He reports that he drinks alcohol. He reports that he does not use illicit drugs.  Allergies: No Known Allergies  Medications: I have reviewed the patient's current medications. Prior to Admission:  Prior to Admission medications   Medication Sig Start Date End Date Taking? Authorizing Provider  albuterol (PROVENTIL HFA;VENTOLIN HFA) 108 (90 BASE) MCG/ACT inhaler Inhale 2 puffs into the lungs every 6 (six) hours as needed for wheezing. 06/22/14  Yes Brunetta Jeans, PA-C  budesonide-formoterol (SYMBICORT) 80-4.5 MCG/ACT inhaler Inhale 2 puffs into the lungs 2 (two) times daily. Patient taking differently: Inhale 2 puffs into the lungs at bedtime.  06/22/14  Yes Brunetta Jeans, PA-C  cetirizine (ZYRTEC) 10 MG tablet Take 10 mg by mouth at bedtime.    Yes Historical Provider, MD  cyclobenzaprine (FLEXERIL) 5 MG tablet Take 5 mg by mouth 3 (three) times daily as needed for muscle spasms.   Yes Historical Provider, MD  diphenhydrAMINE (BENADRYL) 25 MG tablet Take 25 mg by mouth every 6 (six) hours as needed for itching or allergies.   Yes Historical Provider, MD  escitalopram (LEXAPRO) 10 MG tablet Take 1 tablet (10 mg total) by mouth daily. Patient taking differently: Take 10 mg by mouth at  bedtime.  06/22/14  Yes Brunetta Jeans, PA-C  fenofibrate (TRICOR) 145 MG tablet TAKE 1 TABLET (145 MG TOTAL) BY MOUTH DAILY. Patient taking differently: TAKE 1 TABLET (145 MG TOTAL) BY MOUTH DAILY AT BEDTIME 09/27/14  Yes Brunetta Jeans, PA-C  ibuprofen (ADVIL,MOTRIN) 200 MG tablet Take 400 mg by mouth every 8 (eight) hours as needed for mild pain or moderate pain.    Yes Historical Provider, MD  lisinopril (PRINIVIL,ZESTRIL) 20 MG tablet Take 1 tablet by mouth daily. Patient taking differently: Take 20 mg by mouth at bedtime. Take 1 tablet by mouth daily. 06/22/14  Yes Brunetta Jeans, PA-C  montelukast  (SINGULAIR) 10 MG tablet Take 1 tablet (10 mg total) by mouth at bedtime. 06/22/14  Yes Brunetta Jeans, PA-C  Multiple Vitamin (MULTIVITAMIN WITH MINERALS) TABS tablet Take 1 tablet by mouth daily.   Yes Historical Provider, MD  Omega-3 Fatty Acids (FISH OIL) 1000 MG CAPS Take 1 capsule by mouth daily.   Yes Historical Provider, MD  ondansetron (ZOFRAN) 8 MG tablet Take 1 tablet (8 mg total) by mouth every 8 (eight) hours as needed for nausea or vomiting. Patient not taking: Reported on 10/08/2014 03/22/13   Brunetta Jeans, PA-C    ROS: History obtained from the patient  General ROS: negative for - chills, fatigue, fever, night sweats, weight gain or weight loss Psychological ROS: negative for - behavioral disorder, hallucinations, memory difficulties, mood swings or suicidal ideation Ophthalmic ROS: negative for - blurry vision, double vision, eye pain or loss of vision ENT ROS: as noted in HPI Allergy and Immunology ROS: negative for - hives or itchy/watery eyes Hematological and Lymphatic ROS: negative for - bleeding problems, bruising or swollen lymph nodes Endocrine ROS: negative for - galactorrhea, hair pattern changes, polydipsia/polyuria or temperature intolerance Respiratory ROS: negative for - cough, hemoptysis, shortness of breath or wheezing Cardiovascular ROS: negative for - chest pain, dyspnea on exertion, edema or irregular heartbeat Gastrointestinal ROS: negative for - abdominal pain, diarrhea, hematemesis, nausea/vomiting or stool incontinence Genito-Urinary ROS: negative for - dysuria, hematuria, incontinence or urinary frequency/urgency Musculoskeletal ROS: negative for - joint swelling or muscular weakness Neurological ROS: as noted in HPI Dermatological ROS: negative for rash and skin lesion changes  Physical Examination: Blood pressure 153/107, pulse 113, temperature 99.3 F (37.4 C), temperature source Oral, resp. rate 21, height 5\' 10"  (1.778 m), weight 86.183 kg  (190 lb), SpO2 98 %.  HEENT-  Normocephalic, no lesions, without obvious abnormality.  Normal external eye and conjunctiva.  Normal TM's bilaterally.  Normal auditory canals and external ears. Normal external nose, mucus membranes and septum.  Normal pharynx. Cardiovascular- S1, S2 normal, pulses palpable throughout   Lungs- chest clear, no wheezing, rales, normal symmetric air entry Abdomen- soft, non-tender; bowel sounds normal; no masses,  no organomegaly Extremities- no edema Lymph-no adenopathy palpable Musculoskeletal-no joint tenderness, deformity or swelling Skin-warm and dry, no hyperpigmentation, vitiligo, or suspicious lesions  Neurological Examination Mental Status: Alert, oriented, thought content appropriate.  Speech fluent without evidence of aphasia.  Able to follow 3 step commands without difficulty. Cranial Nerves: II: Discs flat bilaterally; Visual fields grossly normal, pupils equal, round, reactive to light and accommodation III,IV, VI: ptosis not present, extra-ocular motions intact bilaterally V,VII: smile symmetric, facial light touch sensation normal bilaterally VIII: hearing normal bilaterally IX,X: gag reflex present XI: bilateral shoulder shrug XII: midline tongue extension Motor: Right : Upper extremity   5/5    Left:  Upper extremity   5/5  Lower extremity   5/5     Lower extremity   5/5 Tone and bulk:normal tone throughout; no atrophy noted Sensory: Light touch decreased in the left arm throughout Deep Tendon Reflexes: 2+ and symmetric throughout Plantars: Right: downgoing   Left: downgoing Cerebellar: normal finger-to-nose and normal heel-to-shin testing bilaterally Gait: not tested due to vertigo   Laboratory Studies:  Basic Metabolic Panel:  Recent Labs Lab 10/08/14 1320  NA 137  K 3.9  CL 101  CO2 25  GLUCOSE 104*  BUN 14  CREATININE 0.95  CALCIUM 9.8    Liver Function Tests:  Recent Labs Lab 10/08/14 1320  AST 33  ALT 39   ALKPHOS 59  BILITOT 0.7  PROT 8.3*  ALBUMIN 5.0   No results for input(s): LIPASE, AMYLASE in the last 168 hours. No results for input(s): AMMONIA in the last 168 hours.  CBC:  Recent Labs Lab 10/08/14 1320  WBC 5.6  NEUTROABS 3.3  HGB 15.7  HCT 45.5  MCV 81.8  PLT 211    Cardiac Enzymes:  Recent Labs Lab 10/08/14 1320  TROPONINI <0.03    BNP: Invalid input(s): POCBNP  CBG: No results for input(s): GLUCAP in the last 168 hours.  Microbiology: Results for orders placed or performed during the hospital encounter of 07/28/13  Culture, Group A Strep     Status: None   Collection Time: 07/28/13  7:39 PM  Result Value Ref Range Status   Specimen Description THROAT  Final   Special Requests NONE  Final   Culture   Final    No Beta Hemolytic Streptococci Isolated Performed at [facility]   Report Status 07/30/2013 FINAL  Final    Coagulation Studies:  Recent Labs  10/08/14 1320  LABPROT 14.9  INR 1.15    Urinalysis:  Recent Labs Lab 10/08/14 1345  COLORURINE YELLOW  LABSPEC 1.009  PHURINE 5.5  GLUCOSEU NEGATIVE  HGBUR NEGATIVE  BILIRUBINUR NEGATIVE  KETONESUR NEGATIVE  PROTEINUR NEGATIVE  UROBILINOGEN 0.2  NITRITE NEGATIVE  LEUKOCYTESUR NEGATIVE    Lipid Panel:    Component Value Date/Time   CHOL 246* 06/22/2014 0844   TRIG * 06/22/2014 0844    540.0 Triglyceride is over 400; calculations on Lipids are invalid.   HDL 38.40* 06/22/2014 0844   CHOLHDL 6 06/22/2014 0844   VLDL 45* 09/02/2013 1602   LDLCALC 155* 09/02/2013 1602    HgbA1C:  Lab Results  Component Value Date   HGBA1C 5.3 03/22/2013    Urine Drug Screen:     Component Value Date/Time   LABOPIA NONE DETECTED 10/08/2014 1345   COCAINSCRNUR NONE DETECTED 10/08/2014 1345   LABBENZ NONE DETECTED 10/08/2014 1345   AMPHETMU NONE DETECTED 10/08/2014 1345   THCU NONE DETECTED 10/08/2014 1345   LABBARB NONE DETECTED 10/08/2014 1345    Alcohol Level:  Recent  Labs Lab 10/08/14 1320  ETH <5    Other results: EKG: sinus tachycardia at 122 bpm.  Imaging: Ct Angio Head W/cm &/or Wo Cm  10/08/2014   CLINICAL DATA:  29 year old male code stroke. Vertigo. Initial encounter.  EXAM: CT ANGIOGRAPHY HEAD AND NECK  TECHNIQUE: Multidetector CT imaging of the head and neck was performed using the standard protocol during bolus administration of intravenous contrast. Multiplanar CT image reconstructions and MIPs were obtained to evaluate the vascular anatomy. Carotid stenosis measurements (when applicable) are obtained utilizing NASCET criteria, using the distal internal carotid diameter as the denominator.  CONTRAST:  100 mL Omnipaque 350.  COMPARISON:  Noncontrast head CT 1403 hours today.  FINDINGS: CTA NECK  Skeleton: Carious right posterior maxillary dentition (series 11, image 64). Cervicothoracic scoliosis. No acute osseous abnormality identified. Mild right maxillary sinus mucosal thickening or Garmon mucous retention cysts.  Other neck: Negative lung apices. No superior mediastinal lymphadenopathy.  Thyroid, larynx, pharynx, parapharyngeal spaces, retropharyngeal space, sublingual space, submandibular glands, parotid glands, orbit and scalp soft tissues are within normal limits. Tympanic cavities and mastoids appear clear. No cervical lymphadenopathy.  Aortic arch: Slight bovine arch configuration. No arch atherosclerosis. No great vessel origin stenosis.  Right carotid system: Negative.  Left carotid system: Negative.  Vertebral arteries:Normal proximal subclavian arteries. Normal vertebral artery origins.  Codominant vertebral arteries appear normal to the skullbase.  There is some paravertebral venous contrast contamination contamination at the skullbase.  CTA HEAD  Posterior circulation: Codominant distal vertebral arteries with normal PICA origins. Patent vertebrobasilar junction. Patent basilar artery without stenosis. Normal SCA and left PCA origins. Fetal  type right PCA origin. Left posterior communicating artery diminutive or absent. Bilateral PCA branches within normal limits.  Anterior circulation: Patent ICA siphons. No siphon plaque or stenosis. Ophthalmic and right posterior communicating artery origins are normal. Normal carotid termini, MCA and ACA origins. Anterior communicating artery and bilateral ACA branches are normal. Left MCA M1 segment, bifurcation and left MCA branches are within normal limits. Right MCA M1 segment, bifurcation, and right MCA branches are within normal limits.  Venous sinuses: Patent.  Anatomic variants: Fetal type right PCA origin.  Delayed phase: No abnormal enhancement identified. Stable and normal gray-white matter differentiation.  IMPRESSION: 1. Negative CTA head and neck. 2. Stable, normal CT appearance of the brain. 3. Carious posterior right maxillary dentition.   Electronically Signed   By: Genevie Ann M.D.   On: 10/08/2014 15:39   Ct Head Wo Contrast  10/08/2014   CLINICAL DATA:  29 year old male with dizziness for 4 hours, unsteady gait, abnormal sensation left upper extremity. Code stroke. Initial encounter.  EXAM: CT HEAD WITHOUT CONTRAST  TECHNIQUE: Contiguous axial images were obtained from the base of the skull through the vertex without intravenous contrast.  COMPARISON:  02/17/2013 noncontrast head CT.  FINDINGS: No acute osseous abnormality identified. Visualized paranasal sinuses and mastoids are clear. No acute orbit or scalp soft tissue findings.  Cerebral volume remains normal. Gray-white matter differentiation is within normal limits throughout the brain. No cortically based acute infarct identified. No suspicious intracranial vascular hyperdensity. No acute intracranial hemorrhage identified. No ventriculomegaly. No midline shift, mass effect, or evidence of intracranial mass lesion.  IMPRESSION: Stable and normal noncontrast CT appearance of the brain.  Study discussed by telephone with Dr. Colin Rhein On  10/08/2014 at 14:03 .   Electronically Signed   By: Genevie Ann M.D.   On: 10/08/2014 14:04   Ct Angio Neck W/cm &/or Wo/cm  10/08/2014   CLINICAL DATA:  29 year old male code stroke. Vertigo. Initial encounter.  EXAM: CT ANGIOGRAPHY HEAD AND NECK  TECHNIQUE: Multidetector CT imaging of the head and neck was performed using the standard protocol during bolus administration of intravenous contrast. Multiplanar CT image reconstructions and MIPs were obtained to evaluate the vascular anatomy. Carotid stenosis measurements (when applicable) are obtained utilizing NASCET criteria, using the distal internal carotid diameter as the denominator.  CONTRAST:  100 mL Omnipaque 350.  COMPARISON:  Noncontrast head CT 1403 hours today.  FINDINGS: CTA NECK  Skeleton: Carious right posterior maxillary dentition (series 11, image 64).  Cervicothoracic scoliosis. No acute osseous abnormality identified. Mild right maxillary sinus mucosal thickening or Jeudy mucous retention cysts.  Other neck: Negative lung apices. No superior mediastinal lymphadenopathy.  Thyroid, larynx, pharynx, parapharyngeal spaces, retropharyngeal space, sublingual space, submandibular glands, parotid glands, orbit and scalp soft tissues are within normal limits. Tympanic cavities and mastoids appear clear. No cervical lymphadenopathy.  Aortic arch: Slight bovine arch configuration. No arch atherosclerosis. No great vessel origin stenosis.  Right carotid system: Negative.  Left carotid system: Negative.  Vertebral arteries:Normal proximal subclavian arteries. Normal vertebral artery origins.  Codominant vertebral arteries appear normal to the skullbase.  There is some paravertebral venous contrast contamination contamination at the skullbase.  CTA HEAD  Posterior circulation: Codominant distal vertebral arteries with normal PICA origins. Patent vertebrobasilar junction. Patent basilar artery without stenosis. Normal SCA and left PCA origins. Fetal type right PCA  origin. Left posterior communicating artery diminutive or absent. Bilateral PCA branches within normal limits.  Anterior circulation: Patent ICA siphons. No siphon plaque or stenosis. Ophthalmic and right posterior communicating artery origins are normal. Normal carotid termini, MCA and ACA origins. Anterior communicating artery and bilateral ACA branches are normal. Left MCA M1 segment, bifurcation and left MCA branches are within normal limits. Right MCA M1 segment, bifurcation, and right MCA branches are within normal limits.  Venous sinuses: Patent.  Anatomic variants: Fetal type right PCA origin.  Delayed phase: No abnormal enhancement identified. Stable and normal gray-white matter differentiation.  IMPRESSION: 1. Negative CTA head and neck. 2. Stable, normal CT appearance of the brain. 3. Carious posterior right maxillary dentition.   Electronically Signed   By: Genevie Ann M.D.   On: 10/08/2014 15:39    Assessment: 29 y.o. 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  Patient now some improved after Valium.  Head CT reviewed and shows no acute changes.  CTA performed as well to rule out an embolic posterior circulation event.  No thrombosis noted.  Symptoms felt to represent a recurrent bout of Meniere's.    Stroke Risk Factors - hyperlipidemia and hypertension  Plan: 1. Valium prn.  Has been used in the past.  Bouts usually last a few days including the arm numbness.     Case  Discussed with Dr. Tylene Fantasia, MD [facility] [phone] 10/08/2014, 7:12 PM

## 2014-10-08 NOTE — ED Notes (Signed)
Attempted report X1

## 2014-10-08 NOTE — ED Notes (Signed)
Pt leaving via EMS now.

## 2014-10-08 NOTE — ED Notes (Signed)
Patient transported to CT via stretcher, w/ RN escort

## 2014-10-09 ENCOUNTER — Observation Stay (HOSPITAL_BASED_OUTPATIENT_CLINIC_OR_DEPARTMENT_OTHER): Payer: 59

## 2014-10-09 ENCOUNTER — Encounter (HOSPITAL_COMMUNITY): Payer: Self-pay | Admitting: *Deleted

## 2014-10-09 DIAGNOSIS — H8109 Meniere's disease, unspecified ear: Principal | ICD-10-CM

## 2014-10-09 DIAGNOSIS — E782 Mixed hyperlipidemia: Secondary | ICD-10-CM

## 2014-10-09 DIAGNOSIS — J45909 Unspecified asthma, uncomplicated: Secondary | ICD-10-CM

## 2014-10-09 DIAGNOSIS — I471 Supraventricular tachycardia: Secondary | ICD-10-CM | POA: Diagnosis not present

## 2014-10-09 DIAGNOSIS — F329 Major depressive disorder, single episode, unspecified: Secondary | ICD-10-CM

## 2014-10-09 DIAGNOSIS — I10 Essential (primary) hypertension: Secondary | ICD-10-CM

## 2014-10-09 LAB — LIPID PANEL
CHOLESTEROL: 233 mg/dL — AB (ref 0–200)
HDL: 30 mg/dL — AB (ref >40)
LDL Cholesterol: UNDETERMINED mg/dL (ref 0–99)
TRIGLYCERIDES: 618 mg/dL — AB (ref <150)
Total CHOL/HDL Ratio: 7.8 RATIO
VLDL: UNDETERMINED mg/dL (ref 0–40)

## 2014-10-09 LAB — CBC
HEMATOCRIT: 36.9 % — AB (ref 39.0–52.0)
HEMOGLOBIN: 12.7 g/dL — AB (ref 13.0–17.0)
MCH: 28.7 pg (ref 26.0–34.0)
MCHC: 34.4 g/dL (ref 30.0–36.0)
MCV: 83.3 fL (ref 78.0–100.0)
Platelets: 161 10*3/uL (ref 150–400)
RBC: 4.43 MIL/uL (ref 4.22–5.81)
RDW: 12.9 % (ref 11.5–15.5)
WBC: 4.5 10*3/uL (ref 4.0–10.5)

## 2014-10-09 LAB — CREATININE, SERUM: CREATININE: 0.99 mg/dL (ref 0.61–1.24)

## 2014-10-09 LAB — TROPONIN I: Troponin I: <0.03 ng/mL (ref <0.031)

## 2014-10-09 MED ORDER — SODIUM CHLORIDE 0.9 % IJ SOLN
3.0000 mL | Freq: Two times a day (BID) | INTRAMUSCULAR | Status: DC
  Administered 2014-10-09 (×2): 3 mL via INTRAVENOUS

## 2014-10-09 MED ORDER — ACETAMINOPHEN 325 MG PO TABS: 650.0000 mg | ORAL_TABLET | ORAL | Status: DC | PRN

## 2014-10-09 MED ORDER — ATORVASTATIN CALCIUM 20 MG PO TABS: 20.0000 mg | ORAL_TABLET | ORAL | Status: DC

## 2014-10-09 MED ORDER — DIAZEPAM 5 MG PO TABS: 5.0000 mg | ORAL_TABLET | Freq: Four times a day (QID) | ORAL | Status: DC | PRN

## 2014-10-09 MED ORDER — DIPHENHYDRAMINE HCL 25 MG PO TABS: 25.0000 mg | ORAL_TABLET | Freq: Four times a day (QID) | ORAL | Status: DC | PRN

## 2014-10-09 MED ORDER — ALBUTEROL SULFATE (2.5 MG/3ML) 0.083% IN NEBU: 2.5000 mg | INHALATION_SOLUTION | Freq: Four times a day (QID) | RESPIRATORY_TRACT | Status: DC | PRN

## 2014-10-09 MED ORDER — METOPROLOL SUCCINATE ER 25 MG PO TB24
25.0000 mg | ORAL_TABLET | Freq: Every day | ORAL | Status: DC
Start: 2014-10-09 — End: 2014-10-09
  Administered 2014-10-09: 25 mg via ORAL
  Filled 2014-10-09: qty 1

## 2014-10-09 MED ORDER — MAGNESIUM SULFATE 2 GM/50ML IV SOLN
2.0000 g | Freq: Once | INTRAVENOUS | Status: AC
  Administered 2014-10-09: 2 g via INTRAVENOUS
  Filled 2014-10-09: qty 50

## 2014-10-09 MED ORDER — LISINOPRIL 20 MG PO TABS
20.0000 mg | ORAL_TABLET | Freq: Every day | ORAL | Status: DC
  Administered 2014-10-09: 20 mg via ORAL
  Filled 2014-10-09: qty 1

## 2014-10-09 MED ORDER — FENOFIBRATE 160 MG PO TABS
160.0000 mg | ORAL_TABLET | Freq: Every day | ORAL | Status: DC
  Administered 2014-10-09: 160 mg via ORAL
  Filled 2014-10-09: qty 1

## 2014-10-09 MED ORDER — MONTELUKAST SODIUM 10 MG PO TABS
10.0000 mg | ORAL_TABLET | Freq: Every day | ORAL | Status: DC
  Administered 2014-10-09: 10 mg via ORAL
  Filled 2014-10-09: qty 1

## 2014-10-09 MED ORDER — ADULT MULTIVITAMIN W/MINERALS CH
1.0000 | ORAL_TABLET | Freq: Every day | ORAL | Status: DC
  Administered 2014-10-09: 1 via ORAL
  Filled 2014-10-09: qty 1

## 2014-10-09 MED ORDER — LEVALBUTEROL TARTRATE 45 MCG/ACT IN AERO: 2.0000 | INHALATION_SPRAY | Freq: Four times a day (QID) | RESPIRATORY_TRACT | Status: DC | PRN

## 2014-10-09 MED ORDER — CYCLOBENZAPRINE HCL 5 MG PO TABS: 5.0000 mg | ORAL_TABLET | Freq: Three times a day (TID) | ORAL | Status: DC | PRN

## 2014-10-09 MED ORDER — ONDANSETRON HCL 4 MG/2ML IJ SOLN: 4.0000 mg | Freq: Four times a day (QID) | INTRAMUSCULAR | Status: DC | PRN

## 2014-10-09 MED ORDER — DIAZEPAM 5 MG PO TABS
5.0000 mg | ORAL_TABLET | Freq: Four times a day (QID) | ORAL | Status: DC | PRN
  Administered 2014-10-09: 5 mg via ORAL
  Filled 2014-10-09: qty 1

## 2014-10-09 MED ORDER — POTASSIUM CHLORIDE IN NACL 40-0.9 MEQ/L-% IV SOLN
INTRAVENOUS | Status: AC
  Administered 2014-10-09: 125 mL/h via INTRAVENOUS
  Filled 2014-10-09: qty 1000

## 2014-10-09 MED ORDER — ESCITALOPRAM OXALATE 10 MG PO TABS
10.0000 mg | ORAL_TABLET | Freq: Every day | ORAL | Status: DC
  Administered 2014-10-09: 10 mg via ORAL
  Filled 2014-10-09: qty 1

## 2014-10-09 MED ORDER — METOPROLOL SUCCINATE ER 25 MG PO TB24: 25.0000 mg | ORAL_TABLET | Freq: Every day | ORAL | Status: DC

## 2014-10-09 MED ORDER — IBUPROFEN 400 MG PO TABS
400.0000 mg | ORAL_TABLET | Freq: Three times a day (TID) | ORAL | Status: DC | PRN
  Filled 2014-10-09: qty 1

## 2014-10-09 MED ORDER — ENOXAPARIN SODIUM 40 MG/0.4ML ~~LOC~~ SOLN
40.0000 mg | SUBCUTANEOUS | Status: DC
  Administered 2014-10-09: 40 mg via SUBCUTANEOUS
  Filled 2014-10-09: qty 0.4

## 2014-10-09 MED ORDER — ATORVASTATIN CALCIUM 20 MG PO TABS
20.0000 mg | ORAL_TABLET | ORAL | Status: AC
  Administered 2014-10-09: 20 mg via ORAL
  Filled 2014-10-09: qty 1

## 2014-10-09 MED ORDER — BUDESONIDE-FORMOTEROL FUMARATE 80-4.5 MCG/ACT IN AERO
2.0000 | INHALATION_SPRAY | Freq: Every day | RESPIRATORY_TRACT | Status: DC
  Filled 2014-10-09: qty 6.9

## 2014-10-09 MED ORDER — DIPHENHYDRAMINE HCL 25 MG PO CAPS: 25.0000 mg | ORAL_CAPSULE | Freq: Four times a day (QID) | ORAL | Status: DC | PRN

## 2014-10-09 NOTE — Evaluation (Signed)
Physical Therapy Evaluation Patient Details Name: Steve Jacobs MRN: [id number] DOB: 04/23/1985 Today's Date: 10/09/2014   History of Present Illness  Patient is a 29 yo male admitted 10/08/14 with severe dizziness/vertigo - patient has Meniere's disease.  Patient developed SVT with HR up to 180's.  PMH:  Meniere's disease, HTN, HLD, asthma, depression  Clinical Impression  Patient reports dizziness is no longer present.  Is independent with all mobility and gait. No acute PT needs identified - PT will sign off.    Follow Up Recommendations No PT follow up;Supervision - Intermittent    Equipment Recommendations  None recommended by PT    Recommendations for Other Services       Precautions / Restrictions Precautions Precautions: None Restrictions Weight Bearing Restrictions: No      Mobility  Bed Mobility Overal bed mobility: Independent             General bed mobility comments: No dizziness with transition to sitting.  Transfers Overall transfer level: Independent Equipment used: None             General transfer comment: No assist needed.  No dizziness with transition to stance.  Ambulation/Gait Ambulation/Gait assistance: Independent Ambulation Distance (Feet): 200 Feet Assistive device: None Gait Pattern/deviations: WFL(Within Functional Limits)   Gait velocity interpretation: at or above normal speed for age/gender General Gait Details: Patient with good gait pattern, balance, and speed.  No loss of balance noted.  Patient with no dizziness with gait.  Stairs            Wheelchair Mobility    Modified Rankin (Stroke Patients Only)       Balance Overall balance assessment: Independent                           High level balance activites: Direction changes;Turns;Sudden stops;Head turns (Stepping over obstacles) High Level Balance Comments: No loss of balance with high level balance activities.             Pertinent  Vitals/Pain Pain Assessment: No/denies pain    Home Living Family/patient expects to be discharged to:: Private residence Living Arrangements: Spouse/significant other Available Help at Discharge: Family;Available PRN/intermittently Type of Home: House Home Access: Stairs to enter Entrance Stairs-Rails: None Entrance Stairs-Number of Steps: 4 Home Layout: One level Home Equipment: Crutches      Prior Function Level of Independence: Independent         Comments: Works full time     Journalist, newspaper   Dominant Hand: Right    Extremity/Trunk Assessment   Upper Extremity Assessment: Overall WFL for tasks assessed (Reports LUE with slight tingling/weakness)           Lower Extremity Assessment: Overall WFL for tasks assessed      Cervical / Trunk Assessment: Normal  Communication   Communication: No difficulties  Cognition Arousal/Alertness: Awake/alert Behavior During Therapy: WFL for tasks assessed/performed Overall Cognitive Status: Within Functional Limits for tasks assessed                      General Comments      Exercises        Assessment/Plan    PT Assessment Patent does not need any further PT services  PT Diagnosis Abnormality of gait (Dizziness)   PT Problem List    PT Treatment Interventions     PT Goals (Current goals can be found in the Care Plan section) Acute Rehab  PT Goals PT Goal Formulation: All assessment and education complete, DC therapy    Frequency     Barriers to discharge        Co-evaluation               End of Session   Activity Tolerance: Patient tolerated treatment well Patient left: in bed;with call bell/phone within reach;with family/visitor present Nurse Communication: Mobility status (No PT needs)    Functional Assessment Tool Used: Clinical judgement Functional Limitation: Mobility: Walking and moving around Mobility: Walking and Moving Around Current Status ([id number]): 0 percent impaired,  limited or restricted Mobility: Walking and Moving Around Goal Status [phone]): 0 percent impaired, limited or restricted Mobility: Walking and Moving Around Discharge Status [phone]): 0 percent impaired, limited or restricted    Time: 0935-0950 PT Time Calculation (min) (ACUTE ONLY): 15 min   Charges:   PT Evaluation $Initial PT Evaluation Tier I: 1 Procedure     PT G Codes:   PT G-Codes **NOT FOR INPATIENT CLASS** Functional Assessment Tool Used: Clinical judgement Functional Limitation: Mobility: Walking and moving around Mobility: Walking and Moving Around Current Status ([id number]): 0 percent impaired, limited or restricted Mobility: Walking and Moving Around Goal Status ([id number]): 0 percent impaired, limited or restricted Mobility: Walking and Moving Around Discharge Status ([id number]): 0 percent impaired, limited or restricted    Despina Pole 10/09/2014, 10:27 AM Carita Pian. Sanjuana Kava, [place] Pager [phone]

## 2014-10-09 NOTE — Discharge Summary (Signed)
Physician Discharge Summary  Steve Jacobs WNU:[id number] DOB: 01/17/1985 DOA: 10/08/2014  PCP: Leeanne Rio, PA-C  Admit date: 10/08/2014 Discharge date: 10/09/2014  Time spent: 45 minutes  Recommendations for Outpatient Follow-up:  Patient will be discharged to home.  Patient will need to follow up with primary care provider within one week of discharge.  Follow up with ENT.  Patient should continue medications as prescribed.  Patient should follow a heart healthy diet.   Discharge Diagnoses:  Sinus tachycardia Asthma/seasonal allergies Mnire's disease/dizziness Essential hypertension Hypercholesterolemia Depression  Discharge Condition: Stable  Diet recommendation: Heart healthy  Filed Weights   10/08/14 1212 10/08/14 2320 10/09/14 0608  Weight: 86.183 kg (190 lb) 95.391 kg (210 lb 4.8 oz) 94.666 kg (208 lb 11.2 oz)    History of present illness:  On 10/08/2014 by Dr. Stann Mainland is a 29 y.o. male with a past medical history of Mnire's disease, hypertension, hyperlipidemia, asthma, depression, seasonal allergies, thoracic outlet syndrome who initially was seen at [facility] due to dizziness, nausea, unsteady balance, left arm numbness, vision disturbances which the patient describes as things zooming in his field of vision and fatigue for the last 6 days. The patient stated that his symptoms are similar to his Mnire's disease acute episodes, except for the arm numbness on the left. He is case the last time that he had symptoms from his Mnire's prior to today was about 4 years ago.   The patient was initially tachycardic in the 110s and 120s. He received IV fluids and 5 mg of Valium at [facility] and was transferred to [facility] for evaluation by neurology. He will receive another normal saline bolus of 1000 mL and a repeat Valium 5 mg dose. Workup was benign and neurology felt that the patient could go home. However, during the process of  discharge the patient became very tachycardic, up in the 180s and 190s, as per the emergency department medical staff. This subsequently subsided on its own, but the patient continued to have sinus tachycardia. He states that he noticed several days ago that he was tachycardic while he was measuring his blood pressure. He denies chest pain, dyspnea, lower extremity edema, PND or orthopnea during this episode. However he had a positive palpitations, nausea, feeling flushed and mild diaphoresis. He is currently in no acute distress.  Hospital Course:  Sinus tachycardia -Supposed SVT episode, however, do not see documentation of this -Currently regular rate -Reviewing ED notes, Dr. Alfonse Spruce (EDP) provided carotid massage/valsalva, HR improved.  Dr. Wynonia Lawman was consulted by EDP, and felt this was not cardiology related and patient should be admitted for observation -Echocardiogram: EF 60-65% -Troponins cycled and negative x4  Asthma/Seasonal allergies -Continue Symbicort, singulair, zyrtec, Albuterol PRN  Meniere's disease/ Dizziness -CT head/CTA neck:  Negative CTA head and neck,  Stable, normal CT appearance of brain -Neurology consulted and appreciated, recommended Valium PRN and ENT follow up -PT consulted, no further therapy needed  Essential Hypertension -Continue lisinopril and metoprolol  Hypercholesteremia -Continue fenofibrate -Lipid panel: TC 233, TG 618, LDL unable to calculate, HDL 30 -Will start patient on statin, should discuss lifestyle management with PCP  Depression -Continue lexapro  Procedures: Echocardiogram: EF 60-65%  Consultations: Neurology Cardiology by EDP  Discharge Exam: Filed Vitals:   10/09/14 1200  BP: 119/77  Pulse: 83  Temp: 97.7 F (36.5 C)  Resp: 20     General: Well developed, well nourished, NAD, appears stated age  HEENT: NCAT,  mucous membranes moist.  Cardiovascular: S1 S2 auscultated, no rubs, murmurs or gallops. Regular rate and  rhythm.  Respiratory: Clear to auscultation bilaterally with equal chest rise  Abdomen: Soft, nontender, nondistended, + bowel sounds  Extremities: warm dry without cyanosis clubbing or edema  Neuro: AAOx3, nonfocal  Psych: Normal affect and demeanor   Discharge Instructions      Discharge Instructions    Discharge instructions    Complete by:  As directed   Patient will be discharged to home.  Patient will need to follow up with primary care provider within one week of discharge.  Follow up with ENT.  Patient should continue medications as prescribed.  Patient should follow a heart healthy diet.            Medication List    STOP taking these medications        ondansetron 8 MG tablet  Commonly known as:  ZOFRAN      TAKE these medications        albuterol 108 (90 BASE) MCG/ACT inhaler  Commonly known as:  PROVENTIL HFA;VENTOLIN HFA  Inhale 2 puffs into the lungs every 6 (six) hours as needed for wheezing.     atorvastatin 20 MG tablet  Commonly known as:  LIPITOR  Take 1 tablet (20 mg total) by mouth now.     budesonide-formoterol 80-4.5 MCG/ACT inhaler  Commonly known as:  SYMBICORT  Inhale 2 puffs into the lungs 2 (two) times daily.     cetirizine 10 MG tablet  Commonly known as:  ZYRTEC  Take 10 mg by mouth at bedtime.     cyclobenzaprine 5 MG tablet  Commonly known as:  FLEXERIL  Take 5 mg by mouth 3 (three) times daily as needed for muscle spasms.     diazepam 5 MG tablet  Commonly known as:  VALIUM  Take 1 tablet (5 mg total) by mouth every 6 (six) hours as needed for anxiety, muscle spasms or sedation (Meniere's symptoms).     diphenhydrAMINE 25 MG tablet  Commonly known as:  BENADRYL  Take 25 mg by mouth every 6 (six) hours as needed for itching or allergies.     escitalopram 10 MG tablet  Commonly known as:  LEXAPRO  Take 1 tablet (10 mg total) by mouth daily.     fenofibrate 145 MG tablet  Commonly known as:  TRICOR  TAKE 1 TABLET (145  MG TOTAL) BY MOUTH DAILY.     Fish Oil 1000 MG Caps  Take 1 capsule by mouth daily.     ibuprofen 200 MG tablet  Commonly known as:  ADVIL,MOTRIN  Take 400 mg by mouth every 8 (eight) hours as needed for mild pain or moderate pain.     lisinopril 20 MG tablet  Commonly known as:  PRINIVIL,ZESTRIL  Take 1 tablet by mouth daily.     metoprolol succinate 25 MG 24 hr tablet  Commonly known as:  TOPROL-XL  Take 1 tablet (25 mg total) by mouth daily.     montelukast 10 MG tablet  Commonly known as:  SINGULAIR  Take 1 tablet (10 mg total) by mouth at bedtime.     multivitamin with minerals Tabs tablet  Take 1 tablet by mouth daily.       No Known Allergies Follow-up Information    Follow up with Leeanne Rio, PA-C. Schedule an appointment as soon as possible for a visit in 1 week.   Specialty:  Physician Assistant   Why:  Hospital follow up   Contact information:   [address] [address] [phone]        The results of significant diagnostics from this hospitalization (including imaging, microbiology, ancillary and laboratory) are listed below for reference.    Significant Diagnostic Studies: Ct Angio Head W/cm &/or Wo Cm  10/08/2014   CLINICAL DATA:  29 year old male code stroke. Vertigo. Initial encounter.  EXAM: CT ANGIOGRAPHY HEAD AND NECK  TECHNIQUE: Multidetector CT imaging of the head and neck was performed using the standard protocol during bolus administration of intravenous contrast. Multiplanar CT image reconstructions and MIPs were obtained to evaluate the vascular anatomy. Carotid stenosis measurements (when applicable) are obtained utilizing NASCET criteria, using the distal internal carotid diameter as the denominator.  CONTRAST:  100 mL Omnipaque 350.  COMPARISON:  Noncontrast head CT 1403 hours today.  FINDINGS: CTA NECK  Skeleton: Carious right posterior maxillary dentition (series 11, image 64). Cervicothoracic scoliosis.  No acute osseous abnormality identified. Mild right maxillary sinus mucosal thickening or Pienta mucous retention cysts.  Other neck: Negative lung apices. No superior mediastinal lymphadenopathy.  Thyroid, larynx, pharynx, parapharyngeal spaces, retropharyngeal space, sublingual space, submandibular glands, parotid glands, orbit and scalp soft tissues are within normal limits. Tympanic cavities and mastoids appear clear. No cervical lymphadenopathy.  Aortic arch: Slight bovine arch configuration. No arch atherosclerosis. No great vessel origin stenosis.  Right carotid system: Negative.  Left carotid system: Negative.  Vertebral arteries:Normal proximal subclavian arteries. Normal vertebral artery origins.  Codominant vertebral arteries appear normal to the skullbase.  There is some paravertebral venous contrast contamination contamination at the skullbase.  CTA HEAD  Posterior circulation: Codominant distal vertebral arteries with normal PICA origins. Patent vertebrobasilar junction. Patent basilar artery without stenosis. Normal SCA and left PCA origins. Fetal type right PCA origin. Left posterior communicating artery diminutive or absent. Bilateral PCA branches within normal limits.  Anterior circulation: Patent ICA siphons. No siphon plaque or stenosis. Ophthalmic and right posterior communicating artery origins are normal. Normal carotid termini, MCA and ACA origins. Anterior communicating artery and bilateral ACA branches are normal. Left MCA M1 segment, bifurcation and left MCA branches are within normal limits. Right MCA M1 segment, bifurcation, and right MCA branches are within normal limits.  Venous sinuses: Patent.  Anatomic variants: Fetal type right PCA origin.  Delayed phase: No abnormal enhancement identified. Stable and normal gray-white matter differentiation.  IMPRESSION: 1. Negative CTA head and neck. 2. Stable, normal CT appearance of the brain. 3. Carious posterior right maxillary dentition.    Electronically Signed   By: Genevie Ann M.D.   On: 10/08/2014 15:39   Ct Head Wo Contrast  10/08/2014   CLINICAL DATA:  29 year old male with dizziness for 4 hours, unsteady gait, abnormal sensation left upper extremity. Code stroke. Initial encounter.  EXAM: CT HEAD WITHOUT CONTRAST  TECHNIQUE: Contiguous axial images were obtained from the base of the skull through the vertex without intravenous contrast.  COMPARISON:  02/17/2013 noncontrast head CT.  FINDINGS: No acute osseous abnormality identified. Visualized paranasal sinuses and mastoids are clear. No acute orbit or scalp soft tissue findings.  Cerebral volume remains normal. Gray-white matter differentiation is within normal limits throughout the brain. No cortically based acute infarct identified. No suspicious intracranial vascular hyperdensity. No acute intracranial hemorrhage identified. No ventriculomegaly. No midline shift, mass effect, or evidence of intracranial mass lesion.  IMPRESSION: Stable and normal noncontrast CT appearance of the brain.  Study discussed by telephone with  Dr. Colin Rhein On 10/08/2014 at 14:03 .   Electronically Signed   By: Genevie Ann M.D.   On: 10/08/2014 14:04   Ct Angio Neck W/cm &/or Wo/cm  10/08/2014   CLINICAL DATA:  29 year old male code stroke. Vertigo. Initial encounter.  EXAM: CT ANGIOGRAPHY HEAD AND NECK  TECHNIQUE: Multidetector CT imaging of the head and neck was performed using the standard protocol during bolus administration of intravenous contrast. Multiplanar CT image reconstructions and MIPs were obtained to evaluate the vascular anatomy. Carotid stenosis measurements (when applicable) are obtained utilizing NASCET criteria, using the distal internal carotid diameter as the denominator.  CONTRAST:  100 mL Omnipaque 350.  COMPARISON:  Noncontrast head CT 1403 hours today.  FINDINGS: CTA NECK  Skeleton: Carious right posterior maxillary dentition (series 11, image 64). Cervicothoracic scoliosis. No acute osseous  abnormality identified. Mild right maxillary sinus mucosal thickening or Shelden mucous retention cysts.  Other neck: Negative lung apices. No superior mediastinal lymphadenopathy.  Thyroid, larynx, pharynx, parapharyngeal spaces, retropharyngeal space, sublingual space, submandibular glands, parotid glands, orbit and scalp soft tissues are within normal limits. Tympanic cavities and mastoids appear clear. No cervical lymphadenopathy.  Aortic arch: Slight bovine arch configuration. No arch atherosclerosis. No great vessel origin stenosis.  Right carotid system: Negative.  Left carotid system: Negative.  Vertebral arteries:Normal proximal subclavian arteries. Normal vertebral artery origins.  Codominant vertebral arteries appear normal to the skullbase.  There is some paravertebral venous contrast contamination contamination at the skullbase.  CTA HEAD  Posterior circulation: Codominant distal vertebral arteries with normal PICA origins. Patent vertebrobasilar junction. Patent basilar artery without stenosis. Normal SCA and left PCA origins. Fetal type right PCA origin. Left posterior communicating artery diminutive or absent. Bilateral PCA branches within normal limits.  Anterior circulation: Patent ICA siphons. No siphon plaque or stenosis. Ophthalmic and right posterior communicating artery origins are normal. Normal carotid termini, MCA and ACA origins. Anterior communicating artery and bilateral ACA branches are normal. Left MCA M1 segment, bifurcation and left MCA branches are within normal limits. Right MCA M1 segment, bifurcation, and right MCA branches are within normal limits.  Venous sinuses: Patent.  Anatomic variants: Fetal type right PCA origin.  Delayed phase: No abnormal enhancement identified. Stable and normal gray-white matter differentiation.  IMPRESSION: 1. Negative CTA head and neck. 2. Stable, normal CT appearance of the brain. 3. Carious posterior right maxillary dentition.   Electronically  Signed   By: Genevie Ann M.D.   On: 10/08/2014 15:39    Microbiology: No results found for this or any previous visit (from the past 240 hour(s)).   Labs: Basic Metabolic Panel:  Recent Labs Lab 10/08/14 1320 10/08/14 2211 10/09/14 0104  NA 137 139  --   K 3.9 3.7  --   CL 101 107  --   CO2 25 24  --   GLUCOSE 104* 123*  --   BUN 14 10  --   CREATININE 0.95 0.99 0.99  CALCIUM 9.8 8.7*  --   MG  --  1.9  --    Liver Function Tests:  Recent Labs Lab 10/08/14 1320  AST 33  ALT 39  ALKPHOS 59  BILITOT 0.7  PROT 8.3*  ALBUMIN 5.0   No results for input(s): LIPASE, AMYLASE in the last 168 hours. No results for input(s): AMMONIA in the last 168 hours. CBC:  Recent Labs Lab 10/08/14 1320 10/09/14 0104  WBC 5.6 4.5  NEUTROABS 3.3  --   HGB 15.7 12.7*  HCT  45.5 36.9*  MCV 81.8 83.3  PLT 211 161   Cardiac Enzymes:  Recent Labs Lab 10/08/14 1320 10/08/14 2211 10/09/14 0104 10/09/14 0645 10/09/14 1215  TROPONINI <0.03 <0.03 <0.03 <0.03 <0.03   BNP: BNP (last 3 results) No results for input(s): BNP in the last 8760 hours.  ProBNP (last 3 results) No results for input(s): PROBNP in the last 8760 hours.  CBG: No results for input(s): GLUCAP in the last 168 hours.     SignedCristal Ford  [facility] 10/09/2014, 2:03 PM

## 2014-10-09 NOTE — Discharge Instructions (Signed)

## 2014-10-09 NOTE — Progress Notes (Signed)
  Echocardiogram 2D Echocardiogram has been performed.  Steve Jacobs 10/09/2014, 12:27 PM

## 2014-10-09 NOTE — Progress Notes (Signed)
Subjective: Patient reports no further vertigo or diplopia.    Objective: Current vital signs: BP 104/64 mmHg  Pulse 82  Temp(Src) 97.6 F (36.4 C) (Oral)  Resp 18  Ht 5\' 10"  (1.778 m)  Wt 94.666 kg (208 lb 11.2 oz)  BMI 29.95 kg/m2  SpO2 97% Vital signs in last 24 hours: Temp:  [97.6 F (36.4 C)-99.3 F (37.4 C)] 97.6 F (36.4 C) (09/25 6283) Pulse Rate:  [77-129] 82 (09/25 0910) Resp:  [12-27] 18 (09/25 0608) BP: (103-169)/(64-107) 104/64 mmHg (09/25 0910) SpO2:  [95 %-100 %] 97 % (09/25 0608) Weight:  [94.666 kg (208 lb 11.2 oz)-95.391 kg (210 lb 4.8 oz)] 94.666 kg (208 lb 11.2 oz) (09/25 1517)  Intake/Output from previous day: 09/24 0701 - 09/25 0700 In: 4806.3 [I.V.:3756.3; IV Piggyback:1050] Out: 6160 [Urine:5325] Intake/Output this shift: Total I/O In: -  Out: 900 [Urine:900] Nutritional status: Diet Heart Room service appropriate?: Yes; Fluid consistency:: Thin  Neurologic Exam: Mental Status: Alert, oriented, thought content appropriate. Speech fluent without evidence of aphasia. Able to follow 3 step commands without difficulty. Cranial Nerves: II: Discs flat bilaterally; Visual fields grossly normal, pupils equal, round, reactive to light and accommodation III,IV, VI: ptosis not present, extra-ocular motions intact bilaterally V,VII: smile symmetric, facial light touch sensation normal bilaterally VIII: hearing normal bilaterally IX,X: gag reflex present XI: bilateral shoulder shrug XII: midline tongue extension Motor:  5/5throughout   Lab Results: Basic Metabolic Panel:  Recent Labs Lab 10/08/14 1320 10/08/14 2211 10/09/14 0104  NA 137 139  --   K 3.9 3.7  --   CL 101 107  --   CO2 25 24  --   GLUCOSE 104* 123*  --   BUN 14 10  --   CREATININE 0.95 0.99 0.99  CALCIUM 9.8 8.7*  --   MG  --  1.9  --     Liver Function Tests:  Recent Labs Lab 10/08/14 1320  AST 33  ALT 39  ALKPHOS 59  BILITOT 0.7  PROT 8.3*  ALBUMIN 5.0   No  results for input(s): LIPASE, AMYLASE in the last 168 hours. No results for input(s): AMMONIA in the last 168 hours.  CBC:  Recent Labs Lab 10/08/14 1320 10/09/14 0104  WBC 5.6 4.5  NEUTROABS 3.3  --   HGB 15.7 12.7*  HCT 45.5 36.9*  MCV 81.8 83.3  PLT 211 161    Cardiac Enzymes:  Recent Labs Lab 10/08/14 1320 10/08/14 2211 10/09/14 0104 10/09/14 0645  TROPONINI <0.03 <0.03 <0.03 <0.03    Lipid Panel:  Recent Labs Lab 10/09/14 0800  CHOL 233*  TRIG 618*  HDL 30*  CHOLHDL 7.8  VLDL UNABLE TO CALCULATE IF TRIGLYCERIDE OVER 400 mg/dL  LDLCALC UNABLE TO CALCULATE IF TRIGLYCERIDE OVER 400 mg/dL    CBG: No results for input(s): GLUCAP in the last 168 hours.  Microbiology: Results for orders placed or performed during the hospital encounter of 07/28/13  Culture, Group A Strep     Status: None   Collection Time: 07/28/13  7:39 PM  Result Value Ref Range Status   Specimen Description THROAT  Final   Special Requests NONE  Final   Culture   Final    No Beta Hemolytic Streptococci Isolated Performed at [facility] [place]   Report Status 07/30/2013 FINAL  Final    Coagulation Studies:  Recent Labs  10/08/14 1320  LABPROT 14.9  INR 1.15    Imaging: Ct Angio Head W/cm &/or Wo Cm  10/08/2014   CLINICAL DATA:  29 year old male code stroke. Vertigo. Initial encounter.  EXAM: CT ANGIOGRAPHY HEAD AND NECK  TECHNIQUE: Multidetector CT imaging of the head and neck was performed using the standard protocol during bolus administration of intravenous contrast. Multiplanar CT image reconstructions and MIPs were obtained to evaluate the vascular anatomy. Carotid stenosis measurements (when applicable) are obtained utilizing NASCET criteria, using the distal internal carotid diameter as the denominator.  CONTRAST:  100 mL Omnipaque 350.  COMPARISON:  Noncontrast head CT 1403 hours today.  FINDINGS: CTA NECK  Skeleton: Carious right posterior maxillary dentition (series 11,  image 64). Cervicothoracic scoliosis. No acute osseous abnormality identified. Mild right maxillary sinus mucosal thickening or Davies mucous retention cysts.  Other neck: Negative lung apices. No superior mediastinal lymphadenopathy.  Thyroid, larynx, pharynx, parapharyngeal spaces, retropharyngeal space, sublingual space, submandibular glands, parotid glands, orbit and scalp soft tissues are within normal limits. Tympanic cavities and mastoids appear clear. No cervical lymphadenopathy.  Aortic arch: Slight bovine arch configuration. No arch atherosclerosis. No great vessel origin stenosis.  Right carotid system: Negative.  Left carotid system: Negative.  Vertebral arteries:Normal proximal subclavian arteries. Normal vertebral artery origins.  Codominant vertebral arteries appear normal to the skullbase.  There is some paravertebral venous contrast contamination contamination at the skullbase.  CTA HEAD  Posterior circulation: Codominant distal vertebral arteries with normal PICA origins. Patent vertebrobasilar junction. Patent basilar artery without stenosis. Normal SCA and left PCA origins. Fetal type right PCA origin. Left posterior communicating artery diminutive or absent. Bilateral PCA branches within normal limits.  Anterior circulation: Patent ICA siphons. No siphon plaque or stenosis. Ophthalmic and right posterior communicating artery origins are normal. Normal carotid termini, MCA and ACA origins. Anterior communicating artery and bilateral ACA branches are normal. Left MCA M1 segment, bifurcation and left MCA branches are within normal limits. Right MCA M1 segment, bifurcation, and right MCA branches are within normal limits.  Venous sinuses: Patent.  Anatomic variants: Fetal type right PCA origin.  Delayed phase: No abnormal enhancement identified. Stable and normal gray-white matter differentiation.  IMPRESSION: 1. Negative CTA head and neck. 2. Stable, normal CT appearance of the brain. 3. Carious  posterior right maxillary dentition.   Electronically Signed   By: Genevie Ann M.D.   On: 10/08/2014 15:39   Ct Head Wo Contrast  10/08/2014   CLINICAL DATA:  29 year old male with dizziness for 4 hours, unsteady gait, abnormal sensation left upper extremity. Code stroke. Initial encounter.  EXAM: CT HEAD WITHOUT CONTRAST  TECHNIQUE: Contiguous axial images were obtained from the base of the skull through the vertex without intravenous contrast.  COMPARISON:  02/17/2013 noncontrast head CT.  FINDINGS: No acute osseous abnormality identified. Visualized paranasal sinuses and mastoids are clear. No acute orbit or scalp soft tissue findings.  Cerebral volume remains normal. Gray-white matter differentiation is within normal limits throughout the brain. No cortically based acute infarct identified. No suspicious intracranial vascular hyperdensity. No acute intracranial hemorrhage identified. No ventriculomegaly. No midline shift, mass effect, or evidence of intracranial mass lesion.  IMPRESSION: Stable and normal noncontrast CT appearance of the brain.  Study discussed by telephone with Dr. Colin Rhein On 10/08/2014 at 14:03 .   Electronically Signed   By: Genevie Ann M.D.   On: 10/08/2014 14:04   Ct Angio Neck W/cm &/or Wo/cm  10/08/2014   CLINICAL DATA:  29 year old male code stroke. Vertigo. Initial encounter.  EXAM: CT ANGIOGRAPHY HEAD AND NECK  TECHNIQUE: Multidetector CT imaging of the head  and neck was performed using the standard protocol during bolus administration of intravenous contrast. Multiplanar CT image reconstructions and MIPs were obtained to evaluate the vascular anatomy. Carotid stenosis measurements (when applicable) are obtained utilizing NASCET criteria, using the distal internal carotid diameter as the denominator.  CONTRAST:  100 mL Omnipaque 350.  COMPARISON:  Noncontrast head CT 1403 hours today.  FINDINGS: CTA NECK  Skeleton: Carious right posterior maxillary dentition (series 11, image 64).  Cervicothoracic scoliosis. No acute osseous abnormality identified. Mild right maxillary sinus mucosal thickening or Lucking mucous retention cysts.  Other neck: Negative lung apices. No superior mediastinal lymphadenopathy.  Thyroid, larynx, pharynx, parapharyngeal spaces, retropharyngeal space, sublingual space, submandibular glands, parotid glands, orbit and scalp soft tissues are within normal limits. Tympanic cavities and mastoids appear clear. No cervical lymphadenopathy.  Aortic arch: Slight bovine arch configuration. No arch atherosclerosis. No great vessel origin stenosis.  Right carotid system: Negative.  Left carotid system: Negative.  Vertebral arteries:Normal proximal subclavian arteries. Normal vertebral artery origins.  Codominant vertebral arteries appear normal to the skullbase.  There is some paravertebral venous contrast contamination contamination at the skullbase.  CTA HEAD  Posterior circulation: Codominant distal vertebral arteries with normal PICA origins. Patent vertebrobasilar junction. Patent basilar artery without stenosis. Normal SCA and left PCA origins. Fetal type right PCA origin. Left posterior communicating artery diminutive or absent. Bilateral PCA branches within normal limits.  Anterior circulation: Patent ICA siphons. No siphon plaque or stenosis. Ophthalmic and right posterior communicating artery origins are normal. Normal carotid termini, MCA and ACA origins. Anterior communicating artery and bilateral ACA branches are normal. Left MCA M1 segment, bifurcation and left MCA branches are within normal limits. Right MCA M1 segment, bifurcation, and right MCA branches are within normal limits.  Venous sinuses: Patent.  Anatomic variants: Fetal type right PCA origin.  Delayed phase: No abnormal enhancement identified. Stable and normal gray-white matter differentiation.  IMPRESSION: 1. Negative CTA head and neck. 2. Stable, normal CT appearance of the brain. 3. Carious posterior  right maxillary dentition.   Electronically Signed   By: Genevie Ann M.D.   On: 10/08/2014 15:39    Medications:  I have reviewed the patient's current medications. Scheduled: . budesonide-formoterol  2 puff Inhalation QHS  . enoxaparin (LOVENOX) injection  40 mg Subcutaneous Q24H  . escitalopram  10 mg Oral QHS  . fenofibrate  160 mg Oral Daily  . lisinopril  20 mg Oral QHS  . metoprolol succinate  25 mg Oral Daily  . montelukast  10 mg Oral QHS  . multivitamin with minerals  1 tablet Oral Daily  . sodium chloride  3 mL Intravenous Q12H    Assessment/Plan: Patient improved.  Last Valium early this morning.  Cardiac work up in progress.  Recommendations: 1.  Valium prn 2.  ENT follow up at discharge 3.  No further neurologic intervention is recommended at this time.  If further questions arise, please call or page at that time.  Thank you for allowing neurology to participate in the care of this patient.  Alexis Goodell, MD [facility] [phone] 10/09/2014  1:14 PM

## 2014-10-11 ENCOUNTER — Telehealth: Payer: Self-pay | Admitting: Physician Assistant

## 2014-10-11 ENCOUNTER — Ambulatory Visit: Payer: 59 | Admitting: Physician Assistant

## 2014-10-13 NOTE — Telephone Encounter (Signed)
Update..the patient called in to reschedule and said that he had a family emergency and he left a msg on our VM. No one put notes in so I don't know who got the VM. Still charge?

## 2014-10-13 NOTE — Telephone Encounter (Signed)
No charge this time. 

## 2014-10-13 NOTE — Telephone Encounter (Signed)
Charge. 

## 2014-10-13 NOTE — Telephone Encounter (Signed)
Pt was no show 10/11/14 3:30pm, my chart appt, tiffany called 10:44am and lm to see if pt can come in at 3:15, pt LVM 1:59pm confirming he will arrive early, pt is not rescheduled, charge for no show?

## 2014-10-17 ENCOUNTER — Encounter: Payer: Self-pay | Admitting: Physician Assistant

## 2014-10-17 ENCOUNTER — Ambulatory Visit (INDEPENDENT_AMBULATORY_CARE_PROVIDER_SITE_OTHER): Payer: 59 | Admitting: Physician Assistant

## 2014-10-17 VITALS — BP 128/82 | HR 70 | Temp 97.7°F | Resp 16 | Ht 70.0 in | Wt 203.1 lb

## 2014-10-17 DIAGNOSIS — F329 Major depressive disorder, single episode, unspecified: Secondary | ICD-10-CM | POA: Diagnosis not present

## 2014-10-17 DIAGNOSIS — H8109 Meniere's disease, unspecified ear: Secondary | ICD-10-CM | POA: Diagnosis not present

## 2014-10-17 DIAGNOSIS — I471 Supraventricular tachycardia: Secondary | ICD-10-CM

## 2014-10-17 DIAGNOSIS — F32A Depression, unspecified: Secondary | ICD-10-CM

## 2014-10-17 MED ORDER — SUVOREXANT 15 MG PO TABS: 1.0000 | ORAL_TABLET | Freq: Every day | ORAL | Status: DC

## 2014-10-17 MED ORDER — ESCITALOPRAM OXALATE 20 MG PO TABS: 20.0000 mg | ORAL_TABLET | Freq: Every day | ORAL | Status: DC

## 2014-10-17 NOTE — Progress Notes (Signed)
Patient presents to clinic today for hospital follow-up of an exacerbation of his Meniere Disease. Patient admitted to hospital on 10/08/14 after presenting to ER with significant dizziness, visual changes and left arm numbness. Patient given IV fluids and Valium. Workup obtained and Neurology consult given. Patient felt stable to be discharged as workup negative and symptoms resolved.    Patient has episode of SVT prior to discharge so was kept overnight for observation and cardiology assessment. Troponins and echo negative. Cardiology did not feel this episode was cardiac related. No recurrence of SVT was noted so patient was discharged home.  Since discharge patient endorses doing very well overall. Denies dizziness or ear fullness. Denies issues with balance. Has not seen an ENT as directed by Hospitalist. Needs referral.  Is taking medications as directed. Patient denies chest pain, palpitations, lightheadedness, dizziness, vision changes or frequent headaches. BP normotensive in clinic. Is noting some increased stress and depressed mood along with difficulty sleeping.  Past Medical History  Diagnosis Date  . Asthma   . Meniere's disease   . Environmental and seasonal allergies   . Depression     Resolved  . Chicken pox   . Hyperlipidemia   . Essential hypertension, benign 08/03/2013  . Thoracic outlet syndrome     Current Outpatient Prescriptions on File Prior to Visit  Medication Sig Dispense Refill  . albuterol (PROVENTIL HFA;VENTOLIN HFA) 108 (90 BASE) MCG/ACT inhaler Inhale 2 puffs into the lungs every 6 (six) hours as needed for wheezing. 6.7 g 2  . atorvastatin (LIPITOR) 20 MG tablet Take 1 tablet (20 mg total) by mouth now. 30 tablet 0  . budesonide-formoterol (SYMBICORT) 80-4.5 MCG/ACT inhaler Inhale 2 puffs into the lungs 2 (two) times daily. (Patient taking differently: Inhale 2 puffs into the lungs at bedtime. ) 1 Inhaler 6  . cetirizine (ZYRTEC) 10 MG tablet Take 10 mg  by mouth at bedtime.     . diazepam (VALIUM) 5 MG tablet Take 1 tablet (5 mg total) by mouth every 6 (six) hours as needed for anxiety, muscle spasms or sedation (Meniere's symptoms). 30 tablet 0  . diphenhydrAMINE (BENADRYL) 25 MG tablet Take 25 mg by mouth every 6 (six) hours as needed for itching or allergies.    . fenofibrate (TRICOR) 145 MG tablet TAKE 1 TABLET (145 MG TOTAL) BY MOUTH DAILY. (Patient taking differently: TAKE 1 TABLET (145 MG TOTAL) BY MOUTH DAILY AT BEDTIME) 30 tablet 5  . ibuprofen (ADVIL,MOTRIN) 200 MG tablet Take 400 mg by mouth every 8 (eight) hours as needed for mild pain or moderate pain.     Marland Kitchen lisinopril (PRINIVIL,ZESTRIL) 20 MG tablet Take 1 tablet by mouth daily. (Patient taking differently: Take 20 mg by mouth at bedtime. Take 1 tablet by mouth daily.) 90 tablet 1  . metoprolol succinate (TOPROL-XL) 25 MG 24 hr tablet Take 1 tablet (25 mg total) by mouth daily. 30 tablet 0  . montelukast (SINGULAIR) 10 MG tablet Take 1 tablet (10 mg total) by mouth at bedtime. 90 tablet 1  . Multiple Vitamin (MULTIVITAMIN WITH MINERALS) TABS tablet Take 1 tablet by mouth daily.    . Omega-3 Fatty Acids (FISH OIL) 1000 MG CAPS Take 1 capsule by mouth daily.     No current facility-administered medications on file prior to visit.    No Known Allergies  Family History  Problem Relation Age of Onset  . Hyperlipidemia Father     Living  . Stroke Father   . Hypertension  Father   . Alcohol abuse Mother     Living  . Diabetes Mellitus II Maternal Grandfather   . Hypertension Maternal Grandfather   . Heart failure Maternal Grandfather   . Kidney disease Maternal Grandfather   . Heart disease Maternal Grandmother   . Melanoma Paternal Grandmother   . Heart attack Paternal Grandfather   . Migraines Brother   . Healthy Brother     x2  . Drug abuse Sister     Died of Overdose    Social History   Social History  . Marital Status: Single    Spouse Name: N/A  . Number of  Children: N/A  . Years of Education: N/A   Social History Main Topics  . Smoking status: Never Smoker   . Smokeless tobacco: Never Used  . Alcohol Use: 0.0 oz/week    0 Standard drinks or equivalent per week     Comment: 2-3 beers nightly  . Drug Use: No  . Sexual Activity: Yes    Birth Control/ Protection: None     Comment: male - 1 partner   Other Topics Concern  . None   Social History Narrative    Review of Systems - See HPI.  All other ROS are negative.  BP 128/82 mmHg  Pulse 70  Temp(Src) 97.7 F (36.5 C) (Oral)  Resp 16  Ht 5' 10" (1.778 m)  Wt 203 lb 2 oz (92.137 kg)  BMI 29.15 kg/m2  SpO2 100%  Physical Exam  Constitutional: He is oriented to person, place, and time and well-developed, well-nourished, and in no distress.  HENT:  Head: Normocephalic and atraumatic.  Right Ear: External ear normal.  Left Ear: External ear normal.  Nose: Nose normal.  Mouth/Throat: Oropharynx is clear and moist. No oropharyngeal exudate.  TM within normal limits bilaterally.  Eyes: Conjunctivae are normal.  Neck: Neck supple.  Cardiovascular: Normal rate, regular rhythm, normal heart sounds and intact distal pulses.   Pulmonary/Chest: Effort normal and breath sounds normal. No respiratory distress. He has no wheezes. He has no rales. He exhibits no tenderness.  Neurological: He is alert and oriented to person, place, and time.  Skin: Skin is warm and dry. No rash noted.  Psychiatric: Affect normal.  Vitals reviewed.   Recent Results (from the past 2160 hour(s))  Ethanol     Status: None   Collection Time: 10/08/14  1:20 PM  Result Value Ref Range   Alcohol, Ethyl (B) <5 <5 mg/dL    Comment:        LOWEST DETECTABLE LIMIT FOR SERUM ALCOHOL IS 5 mg/dL FOR MEDICAL PURPOSES ONLY   Protime-INR     Status: None   Collection Time: 10/08/14  1:20 PM  Result Value Ref Range   Prothrombin Time 14.9 11.6 - 15.2 seconds   INR 1.15 0.00 - 1.49  APTT     Status: None    Collection Time: 10/08/14  1:20 PM  Result Value Ref Range   aPTT 37 24 - 37 seconds    Comment:        IF BASELINE aPTT IS ELEVATED, SUGGEST PATIENT RISK ASSESSMENT BE USED TO DETERMINE APPROPRIATE ANTICOAGULANT THERAPY.   CBC     Status: None   Collection Time: 10/08/14  1:20 PM  Result Value Ref Range   WBC 5.6 4.0 - 10.5 K/uL   RBC 5.56 4.22 - 5.81 MIL/uL   Hemoglobin 15.7 13.0 - 17.0 g/dL   HCT 45.5 39.0 - 52.0 %  MCV 81.8 78.0 - 100.0 fL   MCH 28.2 26.0 - 34.0 pg   MCHC 34.5 30.0 - 36.0 g/dL   RDW 13.0 11.5 - 15.5 %   Platelets 211 150 - 400 K/uL  Differential     Status: None   Collection Time: 10/08/14  1:20 PM  Result Value Ref Range   Neutrophils Relative % 57 %   Neutro Abs 3.3 1.7 - 7.7 K/uL   Lymphocytes Relative 29 %   Lymphs Abs 1.6 0.7 - 4.0 K/uL   Monocytes Relative 10 %   Monocytes Absolute 0.5 0.1 - 1.0 K/uL   Eosinophils Relative 3 %   Eosinophils Absolute 0.1 0.0 - 0.7 K/uL   Basophils Relative 1 %   Basophils Absolute 0.1 0.0 - 0.1 K/uL  Comprehensive metabolic panel     Status: Abnormal   Collection Time: 10/08/14  1:20 PM  Result Value Ref Range   Sodium 137 135 - 145 mmol/L   Potassium 3.9 3.5 - 5.1 mmol/L   Chloride 101 101 - 111 mmol/L   CO2 25 22 - 32 mmol/L   Glucose, Bld 104 (H) 65 - 99 mg/dL   BUN 14 6 - 20 mg/dL   Creatinine, Ser 0.95 0.61 - 1.24 mg/dL   Calcium 9.8 8.9 - 10.3 mg/dL   Total Protein 8.3 (H) 6.5 - 8.1 g/dL   Albumin 5.0 3.5 - 5.0 g/dL   AST 33 15 - 41 U/L   ALT 39 17 - 63 U/L   Alkaline Phosphatase 59 38 - 126 U/L   Total Bilirubin 0.7 0.3 - 1.2 mg/dL   GFR calc non Af Amer >60 >60 mL/min   GFR calc Af Amer >60 >60 mL/min    Comment: (NOTE) The eGFR has been calculated using the CKD EPI equation. This calculation has not been validated in all clinical situations. eGFR's persistently <60 mL/min signify possible Chronic Kidney Disease.    Anion gap 11 5 - 15  Troponin I     Status: None   Collection Time:  10/08/14  1:20 PM  Result Value Ref Range   Troponin I <0.03 <0.031 ng/mL    Comment:        NO INDICATION OF MYOCARDIAL INJURY.   Urine rapid drug screen (hosp performed)not at [facility]     Status: None   Collection Time: 10/08/14  1:45 PM  Result Value Ref Range   Opiates NONE DETECTED NONE DETECTED   Cocaine NONE DETECTED NONE DETECTED   Benzodiazepines NONE DETECTED NONE DETECTED   Amphetamines NONE DETECTED NONE DETECTED   Tetrahydrocannabinol NONE DETECTED NONE DETECTED   Barbiturates NONE DETECTED NONE DETECTED    Comment:        DRUG SCREEN FOR MEDICAL PURPOSES ONLY.  IF CONFIRMATION IS NEEDED FOR ANY PURPOSE, NOTIFY LAB WITHIN 5 DAYS.        LOWEST DETECTABLE LIMITS FOR URINE DRUG SCREEN Drug Class       Cutoff (ng/mL) Amphetamine      1000 Barbiturate      200 Benzodiazepine   417 Tricyclics       408 Opiates          300 Cocaine          300 THC              50   Urinalysis, Routine w reflex microscopic (not at [facility])     Status: None   Collection Time: 10/08/14  1:45 PM  Result Value  Ref Range   Color, Urine YELLOW YELLOW   APPearance CLEAR CLEAR   Specific Gravity, Urine 1.009 1.005 - 1.030   pH 5.5 5.0 - 8.0   Glucose, UA NEGATIVE NEGATIVE mg/dL   Hgb urine dipstick NEGATIVE NEGATIVE   Bilirubin Urine NEGATIVE NEGATIVE   Ketones, ur NEGATIVE NEGATIVE mg/dL   Protein, ur NEGATIVE NEGATIVE mg/dL   Urobilinogen, UA 0.2 0.0 - 1.0 mg/dL   Nitrite NEGATIVE NEGATIVE   Leukocytes, UA NEGATIVE NEGATIVE    Comment: MICROSCOPIC NOT DONE ON URINES WITH NEGATIVE PROTEIN, BLOOD, LEUKOCYTES, NITRITE, OR GLUCOSE <1000 mg/dL.  Magnesium     Status: None   Collection Time: 10/08/14 10:11 PM  Result Value Ref Range   Magnesium 1.9 1.7 - 2.4 mg/dL  Troponin I     Status: None   Collection Time: 10/08/14 10:11 PM  Result Value Ref Range   Troponin I <0.03 <0.031 ng/mL    Comment:        NO INDICATION OF MYOCARDIAL INJURY.   Basic metabolic panel     Status: Abnormal     Collection Time: 10/08/14 10:11 PM  Result Value Ref Range   Sodium 139 135 - 145 mmol/L   Potassium 3.7 3.5 - 5.1 mmol/L   Chloride 107 101 - 111 mmol/L   CO2 24 22 - 32 mmol/L   Glucose, Bld 123 (H) 65 - 99 mg/dL   BUN 10 6 - 20 mg/dL   Creatinine, Ser 0.99 0.61 - 1.24 mg/dL   Calcium 8.7 (L) 8.9 - 10.3 mg/dL   GFR calc non Af Amer >60 >60 mL/min   GFR calc Af Amer >60 >60 mL/min    Comment: (NOTE) The eGFR has been calculated using the CKD EPI equation. This calculation has not been validated in all clinical situations. eGFR's persistently <60 mL/min signify possible Chronic Kidney Disease.    Anion gap 8 5 - 15  Troponin I     Status: None   Collection Time: 10/09/14  1:04 AM  Result Value Ref Range   Troponin I <0.03 <0.031 ng/mL    Comment:        NO INDICATION OF MYOCARDIAL INJURY.   CBC     Status: Abnormal   Collection Time: 10/09/14  1:04 AM  Result Value Ref Range   WBC 4.5 4.0 - 10.5 K/uL   RBC 4.43 4.22 - 5.81 MIL/uL   Hemoglobin 12.7 (L) 13.0 - 17.0 g/dL   HCT 36.9 (L) 39.0 - 52.0 %   MCV 83.3 78.0 - 100.0 fL   MCH 28.7 26.0 - 34.0 pg   MCHC 34.4 30.0 - 36.0 g/dL   RDW 12.9 11.5 - 15.5 %   Platelets 161 150 - 400 K/uL  Creatinine, serum     Status: None   Collection Time: 10/09/14  1:04 AM  Result Value Ref Range   Creatinine, Ser 0.99 0.61 - 1.24 mg/dL   GFR calc non Af Amer >60 >60 mL/min   GFR calc Af Amer >60 >60 mL/min    Comment: (NOTE) The eGFR has been calculated using the CKD EPI equation. This calculation has not been validated in all clinical situations. eGFR's persistently <60 mL/min signify possible Chronic Kidney Disease.   Troponin I     Status: None   Collection Time: 10/09/14  6:45 AM  Result Value Ref Range   Troponin I <0.03 <0.031 ng/mL    Comment:        NO INDICATION OF MYOCARDIAL INJURY.  Lipid panel     Status: Abnormal   Collection Time: 10/09/14  8:00 AM  Result Value Ref Range   Cholesterol 233 (H) 0 - 200  mg/dL   Triglycerides 618 (H) <150 mg/dL   HDL 30 (L) >40 mg/dL   Total CHOL/HDL Ratio 7.8 RATIO   VLDL UNABLE TO CALCULATE IF TRIGLYCERIDE OVER 400 mg/dL 0 - 40 mg/dL   LDL Cholesterol UNABLE TO CALCULATE IF TRIGLYCERIDE OVER 400 mg/dL 0 - 99 mg/dL    Comment:        Total Cholesterol/HDL:CHD Risk Coronary Heart Disease Risk Table                     Men   Women  1/2 Average Risk   3.4   3.3  Average Risk       5.0   4.4  2 X Average Risk   9.6   7.1  3 X Average Risk  23.4   11.0        Use the calculated Patient Ratio above and the CHD Risk Table to determine the patient's CHD Risk.        ATP III CLASSIFICATION (LDL):  <100     mg/dL   Optimal  100-129  mg/dL   Near or Above                    Optimal  130-159  mg/dL   Borderline  160-189  mg/dL   High  >190     mg/dL   Very High   Troponin I     Status: None   Collection Time: 10/09/14 12:15 PM  Result Value Ref Range   Troponin I <0.03 <0.031 ng/mL    Comment:        NO INDICATION OF MYOCARDIAL INJURY.     Assessment/Plan: SVT (supraventricular tachycardia) Isolated episode during hospitalization. Cardiac workup unremarkable. No recurrence of symptoms. BP stable and normotensive. Do not feel any further workup is indicated. Continue medications as directed.  Meniere disease Stable. No residual symptoms. Referral to ENT placed for ongoing care.  Depression Increase Lexapro to 20 mg daily. Will start trial of Belsomra 15 mg nightly.

## 2014-10-17 NOTE — Assessment & Plan Note (Signed)
Isolated episode during hospitalization. Cardiac workup unremarkable. No recurrence of symptoms. BP stable and normotensive. Do not feel any further workup is indicated. Continue medications as directed.

## 2014-10-17 NOTE — Assessment & Plan Note (Signed)
Stable. No residual symptoms. Referral to ENT placed for ongoing care.

## 2014-10-17 NOTE — Assessment & Plan Note (Signed)
Increase Lexapro to 20 mg daily. Will start trial of Belsomra 15 mg nightly.

## 2014-10-17 NOTE — Progress Notes (Signed)
Pre visit review using our clinic review tool, if applicable. No additional management support is needed unless otherwise documented below in the visit note/SLS  

## 2014-10-17 NOTE — Patient Instructions (Signed)
Please continue your medications as directed. Start the [employer] as directed. Let me know how the dose is working for you on the trial so when we start a full prescription, I will know what dose to send in.  You will be contacted by ENT for assessment.

## 2014-10-18 ENCOUNTER — Encounter: Payer: Self-pay | Admitting: Physician Assistant

## 2014-10-19 ENCOUNTER — Encounter: Payer: Self-pay | Admitting: Physician Assistant

## 2014-10-19 MED ORDER — DIAZEPAM 5 MG PO TABS: 5.0000 mg | ORAL_TABLET | Freq: Four times a day (QID) | ORAL | Status: DC | PRN

## 2014-10-24 ENCOUNTER — Telehealth: Payer: Self-pay | Admitting: *Deleted

## 2014-10-24 ENCOUNTER — Encounter: Payer: Self-pay | Admitting: Physician Assistant

## 2014-10-24 NOTE — Telephone Encounter (Signed)
FMLA forms received via fax from [employer]. Filled out as much as possible and forwarded to [place]. JG//CMA

## 2014-10-26 ENCOUNTER — Encounter: Payer: Self-pay | Admitting: Physician Assistant

## 2014-10-26 MED ORDER — SUVOREXANT 15 MG PO TABS: 1.0000 | ORAL_TABLET | Freq: Every day | ORAL | Status: DC

## 2014-10-27 NOTE — Telephone Encounter (Signed)
Forms faxed successfully to [employer] on 10/26/14. Sent for scanning. JG//CMA

## 2014-11-03 ENCOUNTER — Other Ambulatory Visit: Payer: Self-pay | Admitting: *Deleted

## 2014-11-03 ENCOUNTER — Encounter: Payer: Self-pay | Admitting: Physician Assistant

## 2014-11-03 MED ORDER — DIAZEPAM 5 MG PO TABS: 5.0000 mg | ORAL_TABLET | Freq: Two times a day (BID) | ORAL | Status: DC

## 2014-11-03 NOTE — Telephone Encounter (Signed)
Please refer to MyChart message. Cody asked me to phone in diazepam 5 mg tabs, BID, quantity of 60 with 2 refills. Phoned in med to [facility]. Sent pt message on MyChart informing him of this. JG//CMA

## 2014-11-03 NOTE — Telephone Encounter (Signed)
Will you please phone in Rx Valium 5 mg -- 1 tablet by mouth BID, Quantity 60 with 2 refills.  Will you also please let the patient know (via this MyChart) encounter that this has been taken care of. Thank you.

## 2014-11-07 ENCOUNTER — Other Ambulatory Visit: Payer: Self-pay | Admitting: Physician Assistant

## 2014-11-07 MED ORDER — ATORVASTATIN CALCIUM 20 MG PO TABS: 20.0000 mg | ORAL_TABLET | ORAL | Status: DC

## 2014-11-07 MED ORDER — FENOFIBRATE 145 MG PO TABS: ORAL_TABLET | ORAL | Status: DC

## 2014-11-07 MED ORDER — METOPROLOL SUCCINATE ER 25 MG PO TB24: 25.0000 mg | ORAL_TABLET | Freq: Every day | ORAL | Status: DC

## 2014-11-08 NOTE — Telephone Encounter (Signed)
Forms received on 11/07/14. Filled out as much as possible and forwarded to [place]. JG//CMA

## 2014-12-06 ENCOUNTER — Other Ambulatory Visit: Payer: Self-pay | Admitting: Physician Assistant

## 2014-12-06 MED ORDER — LISINOPRIL 20 MG PO TABS: ORAL_TABLET | ORAL | Status: DC

## 2015-01-03 ENCOUNTER — Encounter: Payer: Self-pay | Admitting: Physician Assistant

## 2015-01-04 ENCOUNTER — Telehealth: Payer: Self-pay | Admitting: Physician Assistant

## 2015-01-04 MED ORDER — ICOSAPENT ETHYL 1 G PO CAPS: 2.0000 g | ORAL_CAPSULE | Freq: Two times a day (BID) | ORAL | Status: DC

## 2015-01-04 NOTE — Telephone Encounter (Signed)
Patient called to inform that he had his flu shot in October at [facility]

## 2015-01-04 NOTE — Telephone Encounter (Signed)
Noted in HM. 

## 2015-01-12 ENCOUNTER — Encounter: Payer: Self-pay | Admitting: Physician Assistant

## 2015-01-12 DIAGNOSIS — E785 Hyperlipidemia, unspecified: Secondary | ICD-10-CM

## 2015-01-12 NOTE — Telephone Encounter (Signed)
AST, ALT, FLP ordered.

## 2015-01-31 ENCOUNTER — Other Ambulatory Visit: Payer: Self-pay | Admitting: Physician Assistant

## 2015-01-31 MED FILL — SYMBICORT 80-4.5 MCG INH: 80-4.5 | 30 days supply | Qty: 10 | Fill #2

## 2015-01-31 MED FILL — VENTOLIN HFA 90 MCG INHALER: 108 (90 BAS | 25 days supply | Qty: 18 | Fill #0

## 2015-01-31 MED FILL — ESCITALOPRAM 20 MG TABLET: 20 | 30 days supply | Qty: 30 | Fill #1

## 2015-01-31 MED FILL — FENOFIBRATE 145 MG TABLET: 145 | 30 days supply | Qty: 30 | Fill #2

## 2015-01-31 MED FILL — diazePAM 5 MG TABS: 5 | 30 days supply | Qty: 60 | Fill #2

## 2015-02-10 MED FILL — VASCEPA 1 GM CAPSULE: 1 | 30 days supply | Qty: 120 | Fill #1

## 2015-02-10 MED FILL — METOPROLOL SUCC ER 25 MG TA: 25 | 90 days supply | Qty: 90 | Fill #1

## 2015-02-10 MED FILL — ATORVASTATIN 20 MG TABLET: 20 | 90 days supply | Qty: 90 | Fill #1

## 2015-03-03 ENCOUNTER — Other Ambulatory Visit: Payer: Self-pay | Admitting: Physician Assistant

## 2015-03-03 ENCOUNTER — Encounter: Payer: Self-pay | Admitting: Physician Assistant

## 2015-03-03 MED FILL — diazePAM 5 MG TABS: 5 | 30 days supply | Qty: 60 | Fill #0

## 2015-03-03 MED FILL — FENOFIBRATE 145 MG TABLET: 145 | 30 days supply | Qty: 30 | Fill #3

## 2015-03-03 MED FILL — MONTELUKAST SOD 10 MG TAB: 10 | 90 days supply | Qty: 90 | Fill #0

## 2015-03-03 MED FILL — ESCITALOPRAM 20 MG TABLET: 20 | 30 days supply | Qty: 30 | Fill #2

## 2015-03-03 MED FILL — VASCEPA 1 GM CAPSULE: 1 | 30 days supply | Qty: 120 | Fill #2

## 2015-03-05 ENCOUNTER — Encounter: Payer: Self-pay | Admitting: Physician Assistant

## 2015-03-10 ENCOUNTER — Telehealth: Payer: 59 | Admitting: Family

## 2015-03-10 DIAGNOSIS — M545 Low back pain, unspecified: Secondary | ICD-10-CM

## 2015-03-10 MED ORDER — ETODOLAC 300 MG PO CAPS: 300.0000 mg | ORAL_CAPSULE | Freq: Three times a day (TID) | ORAL | Status: DC

## 2015-03-10 MED ORDER — BACLOFEN 10 MG PO TABS: 10.0000 mg | ORAL_TABLET | Freq: Three times a day (TID) | ORAL | Status: DC | PRN

## 2015-03-10 MED FILL — BACLOFEN 10 MG TABLET: 10 | 7 days supply | Qty: 21 | Fill #0

## 2015-03-10 MED FILL — ETODOLAC 300 MG CAPSULE: 300 | 5 days supply | Qty: 14 | Fill #0

## 2015-03-10 NOTE — Progress Notes (Signed)

## 2015-03-11 ENCOUNTER — Encounter (HOSPITAL_COMMUNITY): Payer: Self-pay | Admitting: Emergency Medicine

## 2015-03-11 ENCOUNTER — Emergency Department (HOSPITAL_COMMUNITY)
Admission: EM | Admit: 2015-03-11 | Discharge: 2015-03-11 | Disposition: A | Discharge status: Home / Self Care | Payer: 59 | Attending: Emergency Medicine | Admitting: Emergency Medicine

## 2015-03-11 DIAGNOSIS — E785 Hyperlipidemia, unspecified: Secondary | ICD-10-CM | POA: Insufficient documentation

## 2015-03-11 DIAGNOSIS — Z7951 Long term (current) use of inhaled steroids: Secondary | ICD-10-CM | POA: Insufficient documentation

## 2015-03-11 DIAGNOSIS — Z79899 Other long term (current) drug therapy: Secondary | ICD-10-CM | POA: Diagnosis not present

## 2015-03-11 DIAGNOSIS — R1310 Dysphagia, unspecified: Secondary | ICD-10-CM | POA: Diagnosis not present

## 2015-03-11 DIAGNOSIS — H8109 Meniere's disease, unspecified ear: Secondary | ICD-10-CM | POA: Insufficient documentation

## 2015-03-11 DIAGNOSIS — F329 Major depressive disorder, single episode, unspecified: Secondary | ICD-10-CM | POA: Diagnosis not present

## 2015-03-11 DIAGNOSIS — J45909 Unspecified asthma, uncomplicated: Secondary | ICD-10-CM | POA: Diagnosis not present

## 2015-03-11 DIAGNOSIS — Z8619 Personal history of other infectious and parasitic diseases: Secondary | ICD-10-CM | POA: Diagnosis not present

## 2015-03-11 DIAGNOSIS — M545 Low back pain: Secondary | ICD-10-CM | POA: Diagnosis present

## 2015-03-11 DIAGNOSIS — M5442 Lumbago with sciatica, left side: Secondary | ICD-10-CM

## 2015-03-11 DIAGNOSIS — I10 Essential (primary) hypertension: Secondary | ICD-10-CM | POA: Diagnosis not present

## 2015-03-11 MED ORDER — TRAMADOL HCL 50 MG PO TABS
50.0000 mg | ORAL_TABLET | Freq: Once | ORAL | Status: AC
  Administered 2015-03-11: 50 mg via ORAL
  Filled 2015-03-11: qty 1

## 2015-03-11 MED ORDER — TRAMADOL HCL 50 MG PO TABS: 50.0000 mg | ORAL_TABLET | Freq: Four times a day (QID) | ORAL | Status: DC | PRN

## 2015-03-11 MED ORDER — DIPHENHYDRAMINE HCL 25 MG PO CAPS
25.0000 mg | ORAL_CAPSULE | Freq: Once | ORAL | Status: AC
  Administered 2015-03-11: 25 mg via ORAL
  Filled 2015-03-11: qty 1

## 2015-03-11 MED ORDER — HYDROCODONE-ACETAMINOPHEN 5-325 MG PO TABS
1.0000 | ORAL_TABLET | Freq: Once | ORAL | Status: DC
  Filled 2015-03-11: qty 1

## 2015-03-11 MED ORDER — DEXAMETHASONE SODIUM PHOSPHATE 10 MG/ML IJ SOLN
10.0000 mg | Freq: Once | INTRAMUSCULAR | Status: AC
  Administered 2015-03-11: 10 mg via INTRAMUSCULAR
  Filled 2015-03-11: qty 1

## 2015-03-11 MED ORDER — CYCLOBENZAPRINE HCL 10 MG PO TABS: 10.0000 mg | ORAL_TABLET | Freq: Two times a day (BID) | ORAL | Status: DC | PRN

## 2015-03-11 NOTE — ED Notes (Signed)
Pt c/o feeling like his throat is closing onset this morning. Pt takes Lisinopril and recently placed on baclofen. Pt talking in complete sentences, no drooling noted.

## 2015-03-11 NOTE — Discharge Instructions (Signed)
Back Exercises The following exercises strengthen the muscles that help to support the back. They also help to keep the lower back flexible. Doing these exercises can help to prevent back pain or lessen existing pain. If you have back pain or discomfort, try doing these exercises 2-3 times each day or as told by your health care provider. When the pain goes away, do them once each day, but increase the number of times that you repeat the steps for each exercise (do more repetitions). If you do not have back pain or discomfort, do these exercises once each day or as told by your health care provider. EXERCISES Single Knee to Chest Repeat these steps 3-5 times for each leg:  Lie on your back on a firm bed or the floor with your legs extended.  Bring one knee to your chest. Your other leg should stay extended and in contact with the floor.  Hold your knee in place by grabbing your knee or thigh.  Pull on your knee until you feel a gentle stretch in your lower back.  Hold the stretch for 10-30 seconds.  Slowly release and straighten your leg. Pelvic Tilt Repeat these steps 5-10 times:  Lie on your back on a firm bed or the floor with your legs extended.  Bend your knees so they are pointing toward the ceiling and your feet are flat on the floor.  Tighten your lower abdominal muscles to press your lower back against the floor. This motion will tilt your pelvis so your tailbone points up toward the ceiling instead of pointing to your feet or the floor.  With gentle tension and even breathing, hold this position for 5-10 seconds. Cat-Cow Repeat these steps until your lower back becomes more flexible:  Get into a hands-and-knees position on a firm surface. Keep your hands under your shoulders, and keep your knees under your hips. You may place padding under your knees for comfort.  Let your head hang down, and point your tailbone toward the floor so your lower back becomes rounded like the  back of a cat.  Hold this position for 5 seconds.  Slowly lift your head and point your tailbone up toward the ceiling so your back forms a sagging arch like the back of a cow.  Hold this position for 5 seconds. Press-Ups Repeat these steps 5-10 times:  Lie on your abdomen (face-down) on the floor.  Place your palms near your head, about shoulder-width apart.  While you keep your back as relaxed as possible and keep your hips on the floor, slowly straighten your arms to raise the top half of your body and lift your shoulders. Do not use your back muscles to raise your upper torso. You may adjust the placement of your hands to make yourself more comfortable.  Hold this position for 5 seconds while you keep your back relaxed.  Slowly return to lying flat on the floor. Bridges Repeat these steps 10 times:  Lie on your back on a firm surface.  Bend your knees so they are pointing toward the ceiling and your feet are flat on the floor.  Tighten your buttocks muscles and lift your buttocks off of the floor until your waist is at almost the same height as your knees. You should feel the muscles working in your buttocks and the back of your thighs. If you do not feel these muscles, slide your feet 1-2 inches farther away from your buttocks.  Hold this position for 3-5  seconds.  Slowly lower your hips to the starting position, and allow your buttocks muscles to relax completely. If this exercise is too easy, try doing it with your arms crossed over your chest. Abdominal Crunches Repeat these steps 5-10 times:  Lie on your back on a firm bed or the floor with your legs extended.  Bend your knees so they are pointing toward the ceiling and your feet are flat on the floor.  Cross your arms over your chest.  Tip your chin slightly toward your chest without bending your neck.  Tighten your abdominal muscles and slowly raise your trunk (torso) high enough to lift your shoulder blades a  tiny bit off of the floor. Avoid raising your torso higher than that, because it can put too much stress on your low back and it does not help to strengthen your abdominal muscles.  Slowly return to your starting position. Back Lifts Repeat these steps 5-10 times:  Lie on your abdomen (face-down) with your arms at your sides, and rest your forehead on the floor.  Tighten the muscles in your legs and your buttocks.  Slowly lift your chest off of the floor while you keep your hips pressed to the floor. Keep the back of your head in line with the curve in your back. Your eyes should be looking at the floor.  Hold this position for 3-5 seconds.  Slowly return to your starting position. SEEK MEDICAL CARE IF:  Your back pain or discomfort gets much worse when you do an exercise.  Your back pain or discomfort does not lessen within 2 hours after you exercise. If you have any of these problems, stop doing these exercises right away. Do not do them again unless your health care provider says that you can. SEEK IMMEDIATE MEDICAL CARE IF:  You develop sudden, severe back pain. If this happens, stop doing the exercises right away. Do not do them again unless your health care provider says that you can.   This information is not intended to replace advice given to you by your health care provider. Make sure you discuss any questions you have with your health care provider.   Document Released: 02/08/2004 Document Revised: 09/21/2014 Document Reviewed: 02/24/2014 Elsevier Interactive Patient Education 2016 [employer]  Dysphagia Swallowing problems (dysphagia) occur when solids and liquids seem to stick in your throat on the way down to your stomach, or the food takes longer to get to the stomach. Other symptoms include regurgitating food, noises coming from the throat, chest discomfort with swallowing, and a feeling of fullness or the feeling of something being stuck in your throat when  swallowing. When blockage in your throat is complete, it may be associated with drooling. CAUSES  Problems with swallowing may occur because of problems with the muscles. The food cannot be propelled in the usual manner into your stomach. You may have ulcers, scar tissue, or inflammation in the tube down which food travels from your mouth to your stomach (esophagus), which blocks food from passing normally into the stomach. Causes of inflammation include:  Acid reflux from your stomach into your esophagus.  Infection.  Radiation treatment for cancer.  Medicines taken without enough fluids to wash them down into your stomach. You may have nerve problems that prevent signals from being sent to the muscles of your esophagus to contract and move your food down to your stomach. Globus pharyngeus is a relatively common problem in which there is a sense of an obstruction  or difficulty in swallowing, without any physical abnormalities of the swallowing passages being found. This problem usually improves over time with reassurance and testing to rule out other causes. DIAGNOSIS Dysphagia can be diagnosed and its cause can be determined by tests in which you swallow a white substance that helps illuminate the inside of your throat (contrast medium) while X-rays are taken. Sometimes a flexible telescope that is inserted down your throat (endoscopy) to look at your esophagus and stomach is used. TREATMENT   If the dysphagia is caused by acid reflux or infection, medicines may be used.  If the dysphagia is caused by problems with your swallowing muscles, swallowing therapy may be used to help you strengthen your swallowing muscles.  If the dysphagia is caused by a blockage or mass, procedures to remove the blockage may be done. HOME CARE INSTRUCTIONS  Try to eat soft food that is easier to swallow and check your weight on a daily basis to be sure that it is not decreasing.  Be sure to drink liquids when  sitting upright (not lying down). SEEK MEDICAL CARE IF:  You are losing weight because you are unable to swallow.  You are coughing when you drink liquids (aspiration).  You are coughing up partially digested food. SEEK IMMEDIATE MEDICAL CARE IF:  You are unable to swallow your own saliva .  You are having shortness of breath or a fever, or both.  You have a hoarse voice along with difficulty swallowing. MAKE SURE YOU:  Understand these instructions.  Will watch your condition.  Will get help right away if you are not doing well or get worse.   This information is not intended to replace advice given to you by your health care provider. Make sure you discuss any questions you have with your health care provider.   Document Released: 12/29/1999 Document Revised: 01/21/2014 Document Reviewed: 06/19/2012 Elsevier Interactive Patient Education 2016 [place] The sciatic nerve runs from the back down the leg and is responsible for sensation and control of the muscles in the back (posterior) side of the thigh, lower leg, and foot. Sciatica is a condition that is characterized by inflammation of this nerve.  SYMPTOMS   Signs of nerve damage, including numbness and/or weakness along the posterior side of the lower extremity.  Pain in the back of the thigh that may also travel down the leg.  Pain that worsens when sitting for long periods of time.  Occasionally, pain in the back or buttock. CAUSES  Inflammation of the sciatic nerve is the cause of sciatica. The inflammation is due to something irritating the nerve. Common sources of irritation include:  Sitting for long periods of time.  Direct trauma to the nerve.  Arthritis of the spine.  Herniated or ruptured disk.  Slipping of the vertebrae (spondylolisthesis).  Pressure from soft tissues, such as muscles or ligament-like tissue (fascia). RISK INCREASES WITH:  Sports that place pressure or  stress on the spine (football or weightlifting).  Poor strength and flexibility.  Failure to warm up properly before activity.  Family history of low back pain or disk disorders.  Previous back injury or surgery.  Poor body mechanics, especially when lifting, or poor posture. PREVENTION   Warm up and stretch properly before activity.  Maintain physical fitness:  Strength, flexibility, and endurance.  Cardiovascular fitness.  Learn and use proper technique, especially with posture and lifting. When possible, have coach correct improper technique.  Avoid activities that  place stress on the spine. PROGNOSIS If treated properly, then sciatica usually resolves within 6 weeks. However, occasionally surgery is necessary.  RELATED COMPLICATIONS   Permanent nerve damage, including pain, numbness, tingle, or weakness.  Chronic back pain.  Risks of surgery: infection, bleeding, nerve damage, or damage to surrounding tissues. TREATMENT Treatment initially involves resting from any activities that aggravate your symptoms. The use of ice and medication may help reduce pain and inflammation. The use of strengthening and stretching exercises may help reduce pain with activity. These exercises may be performed at home or with referral to a therapist. A therapist may recommend further treatments, such as transcutaneous electronic nerve stimulation (TENS) or ultrasound. Your caregiver may recommend corticosteroid injections to help reduce inflammation of the sciatic nerve. If symptoms persist despite non-surgical (conservative) treatment, then surgery may be recommended. MEDICATION  If pain medication is necessary, then nonsteroidal anti-inflammatory medications, such as aspirin and ibuprofen, or other minor pain relievers, such as acetaminophen, are often recommended.  Do not take pain medication for 7 days before surgery.  Prescription pain relievers may be given if deemed necessary by your  caregiver. Use only as directed and only as much as you need.  Ointments applied to the skin may be helpful.  Corticosteroid injections may be given by your caregiver. These injections should be reserved for the most serious cases, because they may only be given a certain number of times. HEAT AND COLD  Cold treatment (icing) relieves pain and reduces inflammation. Cold treatment should be applied for 10 to 15 minutes every 2 to 3 hours for inflammation and pain and immediately after any activity that aggravates your symptoms. Use ice packs or massage the area with a piece of ice (ice massage).  Heat treatment may be used prior to performing the stretching and strengthening activities prescribed by your caregiver, physical therapist, or athletic trainer. Use a heat pack or soak the injury in warm water. SEEK MEDICAL CARE IF:  Treatment seems to offer no benefit, or the condition worsens.  Any medications produce adverse side effects. EXERCISES  RANGE OF MOTION (ROM) AND STRETCHING EXERCISES - Sciatica Most people with sciatic will find that their symptoms worsen with either excessive bending forward (flexion) or arching at the low back (extension). The exercises which will help resolve your symptoms will focus on the opposite motion. Your physician, physical therapist or athletic trainer will help you determine which exercises will be most helpful to resolve your low back pain. Do not complete any exercises without first consulting with your clinician. Discontinue any exercises which worsen your symptoms until you speak to your clinician. If you have pain, numbness or tingling which travels down into your buttocks, leg or foot, the goal of the therapy is for these symptoms to move closer to your back and eventually resolve. Occasionally, these leg symptoms will get better, but your low back pain may worsen; this is typically an indication of progress in your rehabilitation. Be certain to be very  alert to any changes in your symptoms and the activities in which you participated in the 24 hours prior to the change. Sharing this information with your clinician will allow him/her to most efficiently treat your condition. These exercises may help you when beginning to rehabilitate your injury. Your symptoms may resolve with or without further involvement from your physician, physical therapist or athletic trainer. While completing these exercises, remember:   Restoring tissue flexibility helps normal motion to return to the joints. This allows healthier,  less painful movement and activity.  An effective stretch should be held for at least 30 seconds.  A stretch should never be painful. You should only feel a gentle lengthening or release in the stretched tissue. FLEXION RANGE OF MOTION AND STRETCHING EXERCISES: STRETCH - Flexion, Single Knee to Chest   Lie on a firm bed or floor with both legs extended in front of you.  Keeping one leg in contact with the floor, bring your opposite knee to your chest. Hold your leg in place by either grabbing behind your thigh or at your knee.  Pull until you feel a gentle stretch in your low back. Hold __________ seconds.  Slowly release your grasp and repeat the exercise with the opposite side. Repeat __________ times. Complete this exercise __________ times per day.  STRETCH - Flexion, Double Knee to Chest  Lie on a firm bed or floor with both legs extended in front of you.  Keeping one leg in contact with the floor, bring your opposite knee to your chest.  Tense your stomach muscles to support your back and then lift your other knee to your chest. Hold your legs in place by either grabbing behind your thighs or at your knees.  Pull both knees toward your chest until you feel a gentle stretch in your low back. Hold __________ seconds.  Tense your stomach muscles and slowly return one leg at a time to the floor. Repeat __________ times. Complete  this exercise __________ times per day.  STRETCH - Low Trunk Rotation   Lie on a firm bed or floor. Keeping your legs in front of you, bend your knees so they are both pointed toward the ceiling and your feet are flat on the floor.  Extend your arms out to the side. This will stabilize your upper body by keeping your shoulders in contact with the floor.  Gently and slowly drop both knees together to one side until you feel a gentle stretch in your low back. Hold for __________ seconds.  Tense your stomach muscles to support your low back as you bring your knees back to the starting position. Repeat the exercise to the other side. Repeat __________ times. Complete this exercise __________ times per day  EXTENSION RANGE OF MOTION AND FLEXIBILITY EXERCISES: STRETCH - Extension, Prone on Elbows  Lie on your stomach on the floor, a bed will be too soft. Place your palms about shoulder width apart and at the height of your head.  Place your elbows under your shoulders. If this is too painful, stack pillows under your chest.  Allow your body to relax so that your hips drop lower and make contact more completely with the floor.  Hold this position for __________ seconds.  Slowly return to lying flat on the floor. Repeat __________ times. Complete this exercise __________ times per day.  RANGE OF MOTION - Extension, Prone Press Ups  Lie on your stomach on the floor, a bed will be too soft. Place your palms about shoulder width apart and at the height of your head.  Keeping your back as relaxed as possible, slowly straighten your elbows while keeping your hips on the floor. You may adjust the placement of your hands to maximize your comfort. As you gain motion, your hands will come more underneath your shoulders.  Hold this position __________ seconds.  Slowly return to lying flat on the floor. Repeat __________ times. Complete this exercise __________ times per day.  STRENGTHENING EXERCISES  - Sciatica  These exercises may help you when beginning to rehabilitate your injury. These exercises should be done near your "sweet spot." This is the neutral, low-back arch, somewhere between fully rounded and fully arched, that is your least painful position. When performed in this safe range of motion, these exercises can be used for people who have either a flexion or extension based injury. These exercises may resolve your symptoms with or without further involvement from your physician, physical therapist or athletic trainer. While completing these exercises, remember:   Muscles can gain both the endurance and the strength needed for everyday activities through controlled exercises.  Complete these exercises as instructed by your physician, physical therapist or athletic trainer. Progress with the resistance and repetition exercises only as your caregiver advises.  You may experience muscle soreness or fatigue, but the pain or discomfort you are trying to eliminate should never worsen during these exercises. If this pain does worsen, stop and make certain you are following the directions exactly. If the pain is still present after adjustments, discontinue the exercise until you can discuss the trouble with your clinician. STRENGTHENING - Deep Abdominals, Pelvic Tilt   Lie on a firm bed or floor. Keeping your legs in front of you, bend your knees so they are both pointed toward the ceiling and your feet are flat on the floor.  Tense your lower abdominal muscles to press your low back into the floor. This motion will rotate your pelvis so that your tail bone is scooping upwards rather than pointing at your feet or into the floor.  With a gentle tension and even breathing, hold this position for __________ seconds. Repeat __________ times. Complete this exercise __________ times per day.  STRENGTHENING - Abdominals, Crunches   Lie on a firm bed or floor. Keeping your legs in front of you, bend  your knees so they are both pointed toward the ceiling and your feet are flat on the floor. Cross your arms over your chest.  Slightly tip your chin down without bending your neck.  Tense your abdominals and slowly lift your trunk high enough to just clear your shoulder blades. Lifting higher can put excessive stress on the low back and does not further strengthen your abdominal muscles.  Control your return to the starting position. Repeat __________ times. Complete this exercise __________ times per day.  STRENGTHENING - Quadruped, Opposite UE/LE Lift  Assume a hands and knees position on a firm surface. Keep your hands under your shoulders and your knees under your hips. You may place padding under your knees for comfort.  Find your neutral spine and gently tense your abdominal muscles so that you can maintain this position. Your shoulders and hips should form a rectangle that is parallel with the floor and is not twisted.  Keeping your trunk steady, lift your right hand no higher than your shoulder and then your left leg no higher than your hip. Make sure you are not holding your breath. Hold this position __________ seconds.  Continuing to keep your abdominal muscles tense and your back steady, slowly return to your starting position. Repeat with the opposite arm and leg. Repeat __________ times. Complete this exercise __________ times per day.  STRENGTHENING - Abdominals and Quadriceps, Straight Leg Raise   Lie on a firm bed or floor with both legs extended in front of you.  Keeping one leg in contact with the floor, bend the other knee so that your foot can rest flat on the floor.  Find your neutral spine, and tense your abdominal muscles to maintain your spinal position throughout the exercise.  Slowly lift your straight leg off the floor about 6 inches for a count of 15, making sure to not hold your breath.  Still keeping your neutral spine, slowly lower your leg all the way to  the floor. Repeat this exercise with each leg __________ times. Complete this exercise __________ times per day. POSTURE AND BODY MECHANICS CONSIDERATIONS - Sciatica Keeping correct posture when sitting, standing or completing your activities will reduce the stress put on different body tissues, allowing injured tissues a chance to heal and limiting painful experiences. The following are general guidelines for improved posture. Your physician or physical therapist will provide you with any instructions specific to your needs. While reading these guidelines, remember:  The exercises prescribed by your provider will help you have the flexibility and strength to maintain correct postures.  The correct posture provides the optimal environment for your joints to work. All of your joints have less wear and tear when properly supported by a spine with good posture. This means you will experience a healthier, less painful body.  Correct posture must be practiced with all of your activities, especially prolonged sitting and standing. Correct posture is as important when doing repetitive low-stress activities (typing) as it is when doing a single heavy-load activity (lifting). RESTING POSITIONS Consider which positions are most painful for you when choosing a resting position. If you have pain with flexion-based activities (sitting, bending, stooping, squatting), choose a position that allows you to rest in a less flexed posture. You would want to avoid curling into a fetal position on your side. If your pain worsens with extension-based activities (prolonged standing, working overhead), avoid resting in an extended position such as sleeping on your stomach. Most people will find more comfort when they rest with their spine in a more neutral position, neither too rounded nor too arched. Lying on a non-sagging bed on your side with a pillow between your knees, or on your back with a pillow under your knees will  often provide some relief. Keep in mind, being in any one position for a prolonged period of time, no matter how correct your posture, can still lead to stiffness. PROPER SITTING POSTURE In order to minimize stress and discomfort on your spine, you must sit with correct posture Sitting with good posture should be effortless for a healthy body. Returning to good posture is a gradual process. Many people can work toward this most comfortably by using various supports until they have the flexibility and strength to maintain this posture on their own. When sitting with proper posture, your ears will fall over your shoulders and your shoulders will fall over your hips. You should use the back of the chair to support your upper back. Your low back will be in a neutral position, just slightly arched. You may place a Jacot pillow or folded towel at the base of your low back for support.  When working at a desk, create an environment that supports good, upright posture. Without extra support, muscles fatigue and lead to excessive strain on joints and other tissues. Keep these recommendations in mind: CHAIR:   A chair should be able to slide under your desk when your back makes contact with the back of the chair. This allows you to work closely.  The chair's height should allow your eyes to be level with the upper part of your monitor and your hands  to be slightly lower than your elbows. BODY POSITION  Your feet should make contact with the floor. If this is not possible, use a foot rest.  Keep your ears over your shoulders. This will reduce stress on your neck and low back. INCORRECT SITTING POSTURES   If you are feeling tired and unable to assume a healthy sitting posture, do not slouch or slump. This puts excessive strain on your back tissues, causing more damage and pain. Healthier options include:  Using more support, like a lumbar pillow.  Switching tasks to something that requires you to be upright  or walking.  Talking a brief walk.  Lying down to rest in a neutral-spine position. PROLONGED STANDING WHILE SLIGHTLY LEANING FORWARD  When completing a task that requires you to lean forward while standing in one place for a long time, place either foot up on a stationary 2-4 inch high object to help maintain the best posture. When both feet are on the ground, the low back tends to lose its slight inward curve. If this curve flattens (or becomes too large), then the back and your other joints will experience too much stress, fatigue more quickly and can cause pain.  CORRECT STANDING POSTURES Proper standing posture should be assumed with all daily activities, even if they only take a few moments, like when brushing your teeth. As in sitting, your ears should fall over your shoulders and your shoulders should fall over your hips. You should keep a slight tension in your abdominal muscles to brace your spine. Your tailbone should point down to the ground, not behind your body, resulting in an over-extended swayback posture.  INCORRECT STANDING POSTURES  Common incorrect standing postures include a forward head, locked knees and/or an excessive swayback. WALKING Walk with an upright posture. Your ears, shoulders and hips should all line-up. PROLONGED ACTIVITY IN A FLEXED POSITION When completing a task that requires you to bend forward at your waist or lean over a low surface, try to find a way to stabilize 3 of 4 of your limbs. You can place a hand or elbow on your thigh or rest a knee on the surface you are reaching across. This will provide you more stability so that your muscles do not fatigue as quickly. By keeping your knees relaxed, or slightly bent, you will also reduce stress across your low back. CORRECT LIFTING TECHNIQUES DO :   Assume a wide stance. This will provide you more stability and the opportunity to get as close as possible to the object which you are lifting.  Tense your  abdominals to brace your spine; then bend at the knees and hips. Keeping your back locked in a neutral-spine position, lift using your leg muscles. Lift with your legs, keeping your back straight.  Test the weight of unknown objects before attempting to lift them.  Try to keep your elbows locked down at your sides in order get the best strength from your shoulders when carrying an object.  Always ask for help when lifting heavy or awkward objects. INCORRECT LIFTING TECHNIQUES DO NOT:   Lock your knees when lifting, even if it is a [place] object.  Bend and twist. Pivot at your feet or move your feet when needing to change directions.  Assume that you cannot safely pick up a paperclip without proper posture.   This information is not intended to replace advice given to you by your health care provider. Make sure you discuss any questions you have with  your health care provider.   Follow-up with your primary care provider if her symptoms do not improve. Discontinue home lisinopril and baclofen. Continue taking Benadryl at home until complete resolution of symptoms. Apply ice to your back. Encourage back exercises as indicated above. May take Flexeril and tramadol as needed for muscle pain. Return to the emergency department if you experience severe worsening of her symptoms, difficulty breathing, difficulty swallowing, fever, lip or facial swelling, bowel or bladder incontinence, numbness or tingling in both of your extremities.

## 2015-03-11 NOTE — ED Provider Notes (Signed)
CSN: [id number]     Arrival date & time 03/11/15  1043 History   First MD Initiated Contact with Patient 03/11/15 1115     Chief Complaint  Patient presents with  . Allergic Reaction  . Oral Swelling     (Consider location/radiation/quality/duration/timing/severity/associated sxs/prior Treatment) HPI   Steve Jacobs is a 30 y.o F with a pmhx of Meniere's disease, HTN who presents to the ED today complaining of throat tightening and back pain. Patient states that this morning when he woke up he felt like his throat was closing and he had difficulty swallowing water. Patient is on lisinopril, has been taking this for 3 years but is concerned that his associated. No difficulty breathing or rash. Patient feels like his symptoms are improving but is still having trouble swallowing his secretions. Patient is talking complete sentences, no drooling.  Patient also here complaining of lower back pain. Patient states that 4 days ago he was moving a mattress and had sudden onset left-sided lower back pain that radiated into his left lower extremity. Patient has been taking baclofen and NSAIDs without relief of his symptoms. He has been applying ice to the area. Denies bowel or bladder incontinence, saddle anesthesia, fever, dysuria. Patient is ambulatory with minimal difficulty.  Past Medical History  Diagnosis Date  . Asthma   . Meniere's disease   . Environmental and seasonal allergies   . Depression     Resolved  . Chicken pox   . Hyperlipidemia   . Essential hypertension, benign 08/03/2013  . Thoracic outlet syndrome    Past Surgical History  Procedure Laterality Date  . Nevus biopsy     Family History  Problem Relation Age of Onset  . Hyperlipidemia Father     Living  . Stroke Father   . Hypertension Father   . Alcohol abuse Mother     Living  . Diabetes Mellitus II Maternal Grandfather   . Hypertension Maternal Grandfather   . Heart failure Maternal Grandfather   . Kidney disease  Maternal Grandfather   . Heart disease Maternal Grandmother   . Melanoma Paternal Grandmother   . Heart attack Paternal Grandfather   . Migraines Brother   . Healthy Brother     x2  . Drug abuse Sister     Died of Overdose   Social History  Substance Use Topics  . Smoking status: Never Smoker   . Smokeless tobacco: Never Used  . Alcohol Use: 0.0 oz/week    0 Standard drinks or equivalent per week     Comment: 2-3 beers nightly    Review of Systems  All other systems reviewed and are negative.     Allergies  Review of patient's allergies indicates no known allergies.  Home Medications   Prior to Admission medications   Medication Sig Start Date End Date Taking? Authorizing Provider  atorvastatin (LIPITOR) 20 MG tablet Take 1 tablet (20 mg total) by mouth now. 11/07/14   Brunetta Jeans, PA-C  baclofen (LIORESAL) 10 MG tablet Take 1 tablet (10 mg total) by mouth every 8 (eight) hours as needed for muscle spasms. 03/10/15   Benjamine Mola, FNP  budesonide-formoterol (SYMBICORT) 80-4.5 MCG/ACT inhaler Inhale 2 puffs into the lungs 2 (two) times daily. Patient taking differently: Inhale 2 puffs into the lungs at bedtime.  06/22/14   Brunetta Jeans, PA-C  cetirizine (ZYRTEC) 10 MG tablet Take 10 mg by mouth at bedtime.     Historical Provider, MD  diazepam (VALIUM)  5 MG tablet TAKE 1 TABLET BY MOUTH TWICE DAILY 03/03/15   Brunetta Jeans, PA-C  diphenhydrAMINE (BENADRYL) 25 MG tablet Take 25 mg by mouth every 6 (six) hours as needed for itching or allergies.    Historical Provider, MD  escitalopram (LEXAPRO) 20 MG tablet Take 1 tablet (20 mg total) by mouth daily. 10/17/14   Brunetta Jeans, PA-C  etodolac (LODINE) 300 MG capsule Take 1 capsule (300 mg total) by mouth every 8 (eight) hours. 03/10/15   Benjamine Mola, FNP  fenofibrate (TRICOR) 145 MG tablet TAKE 1 TABLET (145 MG TOTAL) BY MOUTH DAILY. 11/07/14   Brunetta Jeans, PA-C  ibuprofen (ADVIL,MOTRIN) 200 MG tablet Take 400  mg by mouth every 8 (eight) hours as needed for mild pain or moderate pain.     Historical Provider, MD  Icosapent Ethyl 1 G CAPS Take 2 g by mouth 2 (two) times daily. 01/04/15   Brunetta Jeans, PA-C  lisinopril (PRINIVIL,ZESTRIL) 20 MG tablet Take 1 tablet by mouth daily. 12/06/14   Brunetta Jeans, PA-C  MAGNESIUM GLYCINATE PLUS PO Take 500 mg by mouth 2 (two) times daily.    Historical Provider, MD  metoprolol succinate (TOPROL-XL) 25 MG 24 hr tablet Take 1 tablet (25 mg total) by mouth daily. 11/07/14   Brunetta Jeans, PA-C  montelukast (SINGULAIR) 10 MG tablet TAKE 1 TABLET BY MOUTH AT BEDTIME. 03/03/15   Brunetta Jeans, PA-C  Multiple Vitamin (MULTIVITAMIN WITH MINERALS) TABS tablet Take 1 tablet by mouth daily.    Historical Provider, MD  Suvorexant (BELSOMRA) 15 MG TABS Take 1 tablet by mouth at bedtime. 10/26/14   Brunetta Jeans, PA-C  VENTOLIN HFA 108 [phone] Base) MCG/ACT inhaler INHALE 2 PUFFS BY MOUTH EVERY 6 HOURS AS NEEDED FOR WHEEZING 01/31/15   Brunetta Jeans, PA-C   BP 135/97 mmHg  Pulse 96  Temp(Src) 98.6 F (37 C) (Oral)  Resp 20  Ht 5\' 10"  (1.778 m)  Wt 88.451 kg  BMI 27.98 kg/m2  SpO2 97% Physical Exam  Constitutional: He is oriented to person, place, and time. He appears well-developed and well-nourished. No distress.  HENT:  Head: Normocephalic and atraumatic.  Mouth/Throat: Oropharynx is clear and moist. No oropharyngeal exudate.  No oropharyngeal swelling.  Eyes: Conjunctivae and EOM are normal. Pupils are equal, round, and reactive to light. Right eye exhibits no discharge. Left eye exhibits no discharge. No scleral icterus.  Neck: Normal range of motion. Neck supple. No JVD present. No tracheal deviation present. No thyromegaly present.  Cardiovascular: Normal rate, regular rhythm, normal heart sounds and intact distal pulses.  Exam reveals no gallop and no friction rub.   No murmur heard. Pulmonary/Chest: Effort normal and breath sounds normal. No  stridor. No respiratory distress. He has no wheezes. He has no rales. He exhibits no tenderness.  Abdominal: Soft. He exhibits no distension. There is no tenderness. There is no guarding.  Musculoskeletal: Normal range of motion. He exhibits no edema.  No midline spinal tenderness. L lumbar paraspinal muscles tenderness to palpation. FROM of C, T, L spine. Negative SLR.  Lymphadenopathy:    He has no cervical adenopathy.  Neurological: He is alert and oriented to person, place, and time. No cranial nerve deficit.  Strength 5/5 throughout. No sensory deficits.  No gait abnormality.  Skin: Skin is warm and dry. No rash noted. He is not diaphoretic. No erythema. No pallor.  Psychiatric: He has a normal mood and affect. His  behavior is normal.  Nursing note and vitals reviewed.   ED Course  Procedures (including critical care time) Labs Review Labs Reviewed - No data to display  Imaging Review No results found. I have personally reviewed and evaluated these images and lab results as part of my medical decision-making.   EKG Interpretation None      MDM   Final diagnoses:  Left-sided low back pain with left-sided sciatica  Dysphagia   30 year old male presents to the ED with acute onset sensation of his throat closing. Pt states that he is having trouble swallowing. No difficulty breathing. Pt talking in complete sentences. No hypoxia or respiratory distress. No lip or tongue swelling. In ED, pt states this sensation is improving. Given PO Benadryl which he swallowed without difficulty. Pt also given decadron injection. Will monitor in ED to ensure no worsening of symptoms or facial swelling. Pt also c/o back pain after lifting a mattress 4 days ago. Taken Baclofen without relief. Pt ambulatory in ED without difficulty. No bowel or bladder incontinence. No saddle anesthesia. No fever or IVDA. Will d/c home with flexeril and tramadol.  Pt improved in ED after benadryl and decadron. No  sign of angioedema. Pt instructed to d/c Lisinopril. Continue home Benadryl. Follow up with PCP. Return precautions outlined in patient discharge instructions.   Case discussed with Dr. Regenia Skeeter who agrees with treatment plan.      Dondra Spry [place], PA-C 03/12/15 1732  Sherwood Gambler, MD 03/13/15 [phone]

## 2015-03-18 ENCOUNTER — Telehealth: Payer: 59 | Admitting: Nurse Practitioner

## 2015-03-18 DIAGNOSIS — R05 Cough: Secondary | ICD-10-CM | POA: Diagnosis not present

## 2015-03-18 DIAGNOSIS — R059 Cough, unspecified: Secondary | ICD-10-CM

## 2015-03-18 MED ORDER — PREDNISONE 20 MG PO TABS: ORAL_TABLET | ORAL | Status: DC

## 2015-03-18 MED ORDER — AZITHROMYCIN 250 MG PO TABS: ORAL_TABLET | ORAL | Status: DC

## 2015-03-18 NOTE — Progress Notes (Signed)
We are sorry that you are not feeling well.  Here is how we plan to help!  Based on what you have shared with me it looks like you have upper respiratory tract inflammation that has resulted in a significant cough.  Inflammation and infection in the upper respiratory tract is commonly called bronchitis and has four common causes:  Allergies, Viral Infections, Acid Reflux and Bacterial Infections.  Allergies, viruses and acid reflux are treated by controlling symptoms or eliminating the cause. An example might be a cough caused by taking certain blood pressure medications. You stop the cough by changing the medication. Another example might be a cough caused by acid reflux. Controlling the reflux helps control the cough.  Based on your presentation I believe you most likely have A cough due to bacteria.  When patients have a fever and a productive cough with a change in color or increased sputum production, we are concerned about bacterial bronchitis.  If left untreated it can progress to pneumonia.  If your symptoms do not improve with your treatment plan it is important that you contact your provider.   I have prescribed Azithromyin 250 mg: two tables now and then one tablet daily for 4 additonal days   In addition you may use A non-prescription cough medication called Robitussin DAC. Take 2 teaspoons every 8 hours or Delsym: take 2 teaspoons every 12 hours. i also sent in sterois. HOME CARE  . Only take medications as instructed by your medical team. . Complete the entire course of an antibiotic. . Drink plenty of fluids and get plenty of rest. . Avoid close contacts especially the very young and the elderly . Cover your mouth if you cough or cough into your sleeve. . Always remember to wash your hands . A steam or ultrasonic humidifier can help congestion.    GET HELP RIGHT AWAY IF: . You develop worsening fever. . You become short of breath . You cough up blood. . Your symptoms persist after  you have completed your treatment plan MAKE SURE YOU   Understand these instructions.  Will watch your condition.  Will get help right away if you are not doing well or get worse.  Your e-visit answers were reviewed by a board certified advanced clinical practitioner to complete your personal care plan.  Depending on the condition, your plan could have included both over the counter or prescription medications. If there is a problem please reply  once you have received a response from your provider. Your safety is important to [place].  If you have drug allergies check your prescription carefully.    You can use MyChart to ask questions about today's visit, request a non-urgent call back, or ask for a work or school excuse for 24 hours related to this e-Visit. If it has been greater than 24 hours you will need to follow up with your provider, or enter a new e-Visit to address those concerns. You will get an e-mail in the next two days asking about your experience.  I hope that your e-visit has been valuable and will speed your recovery. Thank you for using e-visits.

## 2015-03-22 ENCOUNTER — Other Ambulatory Visit (INDEPENDENT_AMBULATORY_CARE_PROVIDER_SITE_OTHER): Payer: 59

## 2015-03-22 ENCOUNTER — Encounter: Payer: Self-pay | Admitting: Physician Assistant

## 2015-03-22 ENCOUNTER — Ambulatory Visit (INDEPENDENT_AMBULATORY_CARE_PROVIDER_SITE_OTHER): Payer: 59 | Admitting: Physician Assistant

## 2015-03-22 VITALS — BP 130/82 | HR 101 | Temp 97.7°F | Resp 18 | Ht 70.0 in | Wt 216.0 lb

## 2015-03-22 DIAGNOSIS — E785 Hyperlipidemia, unspecified: Secondary | ICD-10-CM | POA: Diagnosis not present

## 2015-03-22 DIAGNOSIS — R635 Abnormal weight gain: Secondary | ICD-10-CM | POA: Diagnosis not present

## 2015-03-22 DIAGNOSIS — F329 Major depressive disorder, single episode, unspecified: Secondary | ICD-10-CM

## 2015-03-22 DIAGNOSIS — E782 Mixed hyperlipidemia: Secondary | ICD-10-CM

## 2015-03-22 DIAGNOSIS — H8109 Meniere's disease, unspecified ear: Secondary | ICD-10-CM | POA: Diagnosis not present

## 2015-03-22 DIAGNOSIS — F32A Depression, unspecified: Secondary | ICD-10-CM

## 2015-03-22 LAB — TSH: TSH: 1.2 u[IU]/mL (ref 0.35–4.50)

## 2015-03-22 LAB — LIPID PANEL
CHOL/HDL RATIO: 2
CHOLESTEROL: 154 mg/dL (ref 0–200)
HDL: 64.8 mg/dL (ref >39.00)
NONHDL: 88.9
TRIGLYCERIDES: 222 mg/dL — AB (ref 0.0–149.0)
VLDL: 44.4 mg/dL — AB (ref 0.0–40.0)

## 2015-03-22 LAB — AST: AST: 28 U/L (ref 0–37)

## 2015-03-22 LAB — LDL CHOLESTEROL, DIRECT: Direct LDL: 57 mg/dL

## 2015-03-22 LAB — ALT: ALT: 40 U/L (ref 0–53)

## 2015-03-22 MED ORDER — FENOFIBRATE 145 MG PO TABS: ORAL_TABLET | ORAL | Status: DC

## 2015-03-22 MED ORDER — ATORVASTATIN CALCIUM 20 MG PO TABS: 20.0000 mg | ORAL_TABLET | Freq: Every day | ORAL | Status: DC

## 2015-03-22 NOTE — Patient Instructions (Addendum)
Work on increasing exercise with weather change.  Eat healthy meal or snack every 4 hours to keep metabolism going and promote weight loss. We will check your thyroid function today. Please call [phone], [facility] regarding your referral for Meniere's disease.  Continue your current dose of lexapro 10mg  a day (1/2 tablet) and continue Metoprolol 12.5mg  daily (1/2 tablet). Follow up will be based on your lab results.

## 2015-03-22 NOTE — Progress Notes (Signed)
Pre visit review using our clinic review tool, if applicable. No additional management support is needed unless otherwise documented below in the visit note.  Patient presents to clinic today for follow up of depression. Pt's Lexapro was increased to 20mg  at last visit and he was started on Belsomra 15mg  at bedtime. Unable to tolerate 20mg  dose as he "felt numb". Pt went back to 10mg  daily and side effects resolved and feels depression is well controlled. Denies anxiety, work going well. Took Belsomra for 2 weeks without improvement. Unable to tolerate due to nightmares.  Pt presented to office this morning for fasting lipid panel; results pending. Tolerating atorvastatin 20mg  daily, taking fenofibrate 145mg  daily and Icosapent 2gram twice a day. Reports poor diet, eating whatever he wants. Not currently exercising.   Pt treated for sinus infection on 03/18/15. Currently taking zpak and prednisone 40mg  daily x 5 days. Symptoms are improving but continues to have flare up with meniere's. He was unable to see ENT in October.   Past Medical History  Diagnosis Date  . Asthma   . Meniere's disease   . Environmental and seasonal allergies   . Depression     Resolved  . Chicken pox   . Hyperlipidemia   . Essential hypertension, benign 08/03/2013  . Thoracic outlet syndrome     Current Outpatient Prescriptions on File Prior to Visit  Medication Sig Dispense Refill  . budesonide-formoterol (SYMBICORT) 80-4.5 MCG/ACT inhaler Inhale 2 puffs into the lungs 2 (two) times daily. (Patient taking differently: Inhale 2 puffs into the lungs at bedtime. ) 1 Inhaler 6  . cetirizine (ZYRTEC) 10 MG tablet Take 10 mg by mouth at bedtime.     . diazepam (VALIUM) 5 MG tablet TAKE 1 TABLET BY MOUTH TWICE DAILY 60 tablet 2  . diphenhydrAMINE (BENADRYL) 25 MG tablet Take 25 mg by mouth every 6 (six) hours as needed for itching or allergies.    Marland Kitchen escitalopram (LEXAPRO) 20 MG tablet Take 1 tablet (20 mg total) by mouth  daily. 30 tablet 3  . ibuprofen (ADVIL,MOTRIN) 200 MG tablet Take 400 mg by mouth every 8 (eight) hours as needed for mild pain or moderate pain.     Vanessa Kick Ethyl 1 G CAPS Take 2 g by mouth 2 (two) times daily. 120 capsule 5  . lisinopril (PRINIVIL,ZESTRIL) 20 MG tablet Take 1 tablet by mouth daily. 90 tablet 1  . MAGNESIUM GLYCINATE PLUS PO Take 500 mg by mouth 2 (two) times daily.    . metoprolol succinate (TOPROL-XL) 25 MG 24 hr tablet Take 1 tablet (25 mg total) by mouth daily. (Patient taking differently: Take 12.5 mg by mouth daily. ) 30 tablet 5  . montelukast (SINGULAIR) 10 MG tablet TAKE 1 TABLET BY MOUTH AT BEDTIME. 90 tablet 1  . Multiple Vitamin (MULTIVITAMIN WITH MINERALS) TABS tablet Take 1 tablet by mouth daily.    . predniSONE (DELTASONE) 20 MG tablet 2 po at sametime daily for 5 days 10 tablet 0  . VENTOLIN HFA 108 (90 Base) MCG/ACT inhaler INHALE 2 PUFFS BY MOUTH EVERY 6 HOURS AS NEEDED FOR WHEEZING 18 g 2   No current facility-administered medications on file prior to visit.    Allergies  Allergen Reactions  . Belsomra [Suvorexant] Other (See Comments)    nightmares  . Percocet [Oxycodone-Acetaminophen] Nausea And Vomiting    Family History  Problem Relation Age of Onset  . Hyperlipidemia Father     Living  . Stroke Father   .  Hypertension Father   . Alcohol abuse Mother     Living  . Diabetes Mellitus II Maternal Grandfather   . Hypertension Maternal Grandfather   . Heart failure Maternal Grandfather   . Kidney disease Maternal Grandfather   . Heart disease Maternal Grandmother   . Melanoma Paternal Grandmother   . Heart attack Paternal Grandfather   . Migraines Brother   . Healthy Brother     x2  . Drug abuse Sister     Died of Overdose    Social History   Social History  . Marital Status: Single    Spouse Name: N/A  . Number of Children: N/A  . Years of Education: N/A   Social History Main Topics  . Smoking status: Never Smoker   .  Smokeless tobacco: Never Used  . Alcohol Use: 0.0 oz/week    0 Standard drinks or equivalent per week     Comment: 2-3 beers nightly  . Drug Use: No  . Sexual Activity: Yes    Birth Control/ Protection: None     Comment: male - 1 partner   Other Topics Concern  . None   Social History Narrative    Review of Systems - See HPI.  All other ROS are negative.  BP 130/82 mmHg  Pulse 101  Temp(Src) 97.7 F (36.5 C) (Oral)  Resp 18  Ht 5\' 10"  (1.778 m)  Wt 216 lb (97.977 kg)  BMI 30.99 kg/m2  SpO2 98%  Physical Exam  Constitutional: He is well-developed, well-nourished, and in no distress.  HENT:  Head: Normocephalic and atraumatic.  Right Ear: Tympanic membrane normal.  Left Ear: Tympanic membrane normal.  Nose: No mucosal edema or rhinorrhea.  Mouth/Throat: Uvula is midline, oropharynx is clear and moist and mucous membranes are normal.  Eyes: Conjunctivae and EOM are normal.  Neck: Neck supple.  Cardiovascular: Normal rate, regular rhythm and normal heart sounds.   Pulmonary/Chest: Effort normal and breath sounds normal. No respiratory distress. He has no wheezes. He has no rales.  Lymphadenopathy:    He has no cervical adenopathy.  Neurological: He is alert.  Skin: Skin is warm and dry.  Psychiatric: Affect normal.  Nursing note and vitals reviewed.   Recent Results (from the past 2160 hour(s))  Lipid panel     Status: Abnormal   Collection Time: 03/22/15  9:43 AM  Result Value Ref Range   Cholesterol 154 0 - 200 mg/dL    Comment: ATP III Classification       Desirable:  < 200 mg/dL               Borderline High:  200 - 239 mg/dL          High:  > = 240 mg/dL   Triglycerides 222.0 (H) 0.0 - 149.0 mg/dL    Comment: Normal:  <150 mg/dLBorderline High:  150 - 199 mg/dL   HDL 64.80 >39.00 mg/dL   VLDL 44.4 (H) 0.0 - 40.0 mg/dL   Total CHOL/HDL Ratio 2     Comment:                Men          Women1/2 Average Risk     3.4          3.3Average Risk          5.0           4.42X Average Risk          9.6  7.13X Average Risk          15.0          11.0                       NonHDL 88.90     Comment: NOTE:  Non-HDL goal should be 30 mg/dL higher than patient's LDL goal (i.e. LDL goal of < 70 mg/dL, would have non-HDL goal of < 100 mg/dL)  AST     Status: None   Collection Time: 03/22/15  9:43 AM  Result Value Ref Range   AST 28 0 - 37 U/L  ALT     Status: None   Collection Time: 03/22/15  9:43 AM  Result Value Ref Range   ALT 40 0 - 53 U/L  LDL cholesterol, direct     Status: None   Collection Time: 03/22/15  9:43 AM  Result Value Ref Range   Direct LDL 57.0 mg/dL    Comment: Optimal:  <100 mg/dLNear or Above Optimal:  100-129 mg/dLBorderline High:  130-159 mg/dLHigh:  160-189 mg/dLVery High:  >190 mg/dL  TSH     Status: None   Collection Time: 03/22/15  3:10 PM  Result Value Ref Range   TSH 1.20 0.35 - 4.50 uIU/mL    Assessment/Plan: Meniere disease Patient given number to ENT to call and schedule appointment. Will continue Valium as directed.  Elevated triglycerides with high cholesterol Labs drawn this AM. Will call once resulted. Continue medications as directed for now. Diet and exercise recommendations reviewed.  Depression Endorses doing very well. Sleep has returned to normal. Will continue SSRI at same dose. Follow-up 6 months.

## 2015-03-23 ENCOUNTER — Encounter: Payer: Self-pay | Admitting: Physician Assistant

## 2015-03-26 NOTE — Assessment & Plan Note (Signed)
Endorses doing very well. Sleep has returned to normal. Will continue SSRI at same dose. Follow-up 6 months.

## 2015-03-26 NOTE — Assessment & Plan Note (Signed)
Patient given number to ENT to call and schedule appointment. Will continue Valium as directed.

## 2015-03-26 NOTE — Assessment & Plan Note (Signed)
Labs drawn this AM. Will call once resulted. Continue medications as directed for now. Diet and exercise recommendations reviewed.

## 2015-03-27 MED ORDER — DIAZEPAM 5 MG PO TABS: 5.0000 mg | ORAL_TABLET | Freq: Two times a day (BID) | ORAL | Status: DC

## 2015-03-31 MED FILL — diazePAM 5 MG TABS: 5 | 30 days supply | Qty: 60 | Fill #0

## 2015-04-03 MED FILL — VASCEPA 1 GM CAPSULE: 1 | 30 days supply | Qty: 120 | Fill #3

## 2015-04-04 MED FILL — LISINOPRIL 20 MG TABLET: 20 | 90 days supply | Qty: 90 | Fill #1

## 2015-04-28 MED FILL — ESCITALOPRAM 20 MG TABLET: 20 | 30 days supply | Qty: 30 | Fill #3

## 2015-05-08 MED FILL — diazePAM 5 MG TABS: 5 | 30 days supply | Qty: 60 | Fill #1

## 2015-05-19 ENCOUNTER — Telehealth: Payer: Self-pay | Admitting: Physician Assistant

## 2015-05-19 NOTE — Telephone Encounter (Signed)
Paperwork was in stack of paperwork from 05/18/15 & 05/19/15; do not see any notation for paperwork elsewhere, except for last note for 10/24/14 paperwork [?]/SLS 05/05

## 2015-05-19 NOTE — Telephone Encounter (Signed)
Pt called in to check the status of his FMLA paperwork? - CB: [phone]     Pt also wanted to make PCP aware that he is seeing a specialist in 3 weeks.

## 2015-05-22 NOTE — Telephone Encounter (Signed)
LMOM with contact name and number for return call RE: FMLA and further information needed as to dates/SLS 05/08

## 2015-05-24 ENCOUNTER — Encounter: Payer: Self-pay | Admitting: Physician Assistant

## 2015-05-25 MED FILL — VASCEPA 1 GM CAPSULE: 1 | 30 days supply | Qty: 120 | Fill #4

## 2015-05-25 MED FILL — ATORVASTATIN 20 MG TABLET: 20 | 90 days supply | Qty: 90 | Fill #0

## 2015-05-25 MED FILL — FENOFIBRATE 145 MG TABLET: 145 | 30 days supply | Qty: 30 | Fill #4

## 2015-05-29 ENCOUNTER — Ambulatory Visit: Payer: 59 | Admitting: Physician Assistant

## 2015-05-29 MED FILL — VENTOLIN HFA 90 MCG INHALER: 108 (90 BAS | 25 days supply | Qty: 18 | Fill #1

## 2015-05-30 ENCOUNTER — Ambulatory Visit: Payer: Self-pay | Admitting: Physician Assistant

## 2015-05-30 DIAGNOSIS — R42 Dizziness and giddiness: Secondary | ICD-10-CM | POA: Diagnosis not present

## 2015-05-30 DIAGNOSIS — H9042 Sensorineural hearing loss, unilateral, left ear, with unrestricted hearing on the contralateral side: Secondary | ICD-10-CM | POA: Diagnosis not present

## 2015-05-30 DIAGNOSIS — H8102 Meniere's disease, left ear: Secondary | ICD-10-CM | POA: Diagnosis not present

## 2015-05-30 MED FILL — TRIAMTERENE/HCTZ 37.5/25 CP: 37.5-25 | 30 days supply | Qty: 30 | Fill #0

## 2015-05-31 ENCOUNTER — Encounter: Payer: Self-pay | Admitting: Physician Assistant

## 2015-05-31 ENCOUNTER — Ambulatory Visit (INDEPENDENT_AMBULATORY_CARE_PROVIDER_SITE_OTHER): Payer: 59 | Admitting: Physician Assistant

## 2015-05-31 VITALS — BP 122/88 | HR 95 | Temp 98.3°F | Resp 16 | Ht 70.0 in | Wt 212.1 lb

## 2015-05-31 DIAGNOSIS — H8109 Meniere's disease, unspecified ear: Secondary | ICD-10-CM

## 2015-05-31 MED ORDER — TRIAMTERENE-HCTZ 37.5-25 MG PO CAPS: 1.0000 | ORAL_CAPSULE | Freq: Every day | ORAL | Status: DC

## 2015-05-31 NOTE — Patient Instructions (Signed)
Please continue medications as directed, including the new medication given by ENT. Keep an eye on BP. If is is getting less than 110/70 we may need to stop or decrease the dose of your lisinopril.  I will call you as soon as I have finished your paperwork and have faxed everything in.

## 2015-05-31 NOTE — Telephone Encounter (Signed)
Altha Harm Ph# [phone] Claim # [id number]  [employer] calling to check if we received disability papers they faxed [place] on 05/29/15 with need for medical information to process pts claim. She will refax today to [phone].

## 2015-05-31 NOTE — Progress Notes (Signed)
Pre visit review using our clinic review tool, if applicable. No additional management support is needed unless otherwise documented below in the visit note/SLS  

## 2015-05-31 NOTE — Telephone Encounter (Signed)
New STD paperwork received and forwarded to provider to discuss at pt's OV 05/31/15; [employer] FMLA updated with today's visit date and refaxed/SLS 05/17

## 2015-05-31 NOTE — Progress Notes (Signed)
Patient presents to clinic today to discuss short-term disability paperwork regarding his chronic Meniere's disease for which he has had several recent and severe flares. Is followed by ENT again, having seen them last week. Was formally diagnosed with Meniere's disease and started on Dyazide to help with symptoms. Is currently on Valium PRN for anxiety and for flares of vertigo. Is doing much better over the past week but prior to that had several episodes which kept him out of work. Is able to perform job functions well at baseline but during flares the severe dizziness and nausea prohibits all job functions in addition to impairing his ability to drive to and from work. Has follow-up scheduled with ENT next month.  Past Medical History  Diagnosis Date  . Asthma   . Meniere's disease   . Environmental and seasonal allergies   . Depression     Resolved  . Chicken pox   . Hyperlipidemia   . Essential hypertension, benign 08/03/2013  . Thoracic outlet syndrome     Current Outpatient Prescriptions on File Prior to Visit  Medication Sig Dispense Refill  . atorvastatin (LIPITOR) 20 MG tablet Take 1 tablet (20 mg total) by mouth daily at 6 PM. 30 tablet 5  . budesonide-formoterol (SYMBICORT) 80-4.5 MCG/ACT inhaler Inhale 2 puffs into the lungs 2 (two) times daily. (Patient taking differently: Inhale 2 puffs into the lungs at bedtime. ) 1 Inhaler 6  . cetirizine (ZYRTEC) 10 MG tablet Take 10 mg by mouth at bedtime.     . diazepam (VALIUM) 5 MG tablet Take 1 tablet (5 mg total) by mouth 2 (two) times daily. 60 tablet 2  . diphenhydrAMINE (BENADRYL) 25 MG tablet Take 25 mg by mouth every 6 (six) hours as needed for itching or allergies.    Marland Kitchen escitalopram (LEXAPRO) 20 MG tablet Take 1 tablet (20 mg total) by mouth daily. 30 tablet 3  . fenofibrate (TRICOR) 145 MG tablet TAKE 1 TABLET (145 MG TOTAL) BY MOUTH DAILY. 30 tablet 5  . Icosapent Ethyl 1 G CAPS Take 2 g by mouth 2 (two) times daily. 120  capsule 5  . lisinopril (PRINIVIL,ZESTRIL) 20 MG tablet Take 1 tablet by mouth daily. 90 tablet 1  . MAGNESIUM GLYCINATE PLUS PO Take 500 mg by mouth 2 (two) times daily.    . metoprolol succinate (TOPROL-XL) 25 MG 24 hr tablet Take 1 tablet (25 mg total) by mouth daily. 30 tablet 5  . montelukast (SINGULAIR) 10 MG tablet TAKE 1 TABLET BY MOUTH AT BEDTIME. 90 tablet 1  . Multiple Vitamin (MULTIVITAMIN WITH MINERALS) TABS tablet Take 1 tablet by mouth daily.    . VENTOLIN HFA 108 (90 Base) MCG/ACT inhaler INHALE 2 PUFFS BY MOUTH EVERY 6 HOURS AS NEEDED FOR WHEEZING 18 g 2  . ibuprofen (ADVIL,MOTRIN) 200 MG tablet Take 400 mg by mouth every 8 (eight) hours as needed for mild pain or moderate pain. Reported on 05/31/2015     No current facility-administered medications on file prior to visit.    Allergies  Allergen Reactions  . Belsomra [Suvorexant] Other (See Comments)    nightmares  . Percocet [Oxycodone-Acetaminophen] Nausea And Vomiting    Family History  Problem Relation Age of Onset  . Hyperlipidemia Father     Living  . Stroke Father   . Hypertension Father   . Alcohol abuse Mother     Living  . Diabetes Mellitus II Maternal Grandfather   . Hypertension Maternal Grandfather   .  Heart failure Maternal Grandfather   . Kidney disease Maternal Grandfather   . Heart disease Maternal Grandmother   . Melanoma Paternal Grandmother   . Heart attack Paternal Grandfather   . Migraines Brother   . Healthy Brother     x2  . Drug abuse Sister     Died of Overdose    Social History   Social History  . Marital Status: Single    Spouse Name: N/A  . Number of Children: N/A  . Years of Education: N/A   Social History Main Topics  . Smoking status: Never Smoker   . Smokeless tobacco: Never Used  . Alcohol Use: 0.0 oz/week    0 Standard drinks or equivalent per week     Comment: 2-3 beers nightly  . Drug Use: No  . Sexual Activity: Yes    Birth Control/ Protection: None      Comment: male - 1 partner   Other Topics Concern  . None   Social History Narrative    Review of Systems - See HPI.  All other ROS are negative.  BP 122/88 mmHg  Pulse 95  Temp(Src) 98.3 F (36.8 C) (Oral)  Resp 16  Ht 5\' 10"  (1.778 m)  Wt 212 lb 2 oz (96.219 kg)  BMI 30.44 kg/m2  SpO2 98%  Physical Exam  Constitutional: He is oriented to person, place, and time and well-developed, well-nourished, and in no distress.  HENT:  Head: Normocephalic and atraumatic.  Eyes: Conjunctivae are normal. Pupils are equal, round, and reactive to light.  Neck: Neck supple.  Cardiovascular: Normal rate, regular rhythm, normal heart sounds and intact distal pulses.   Pulmonary/Chest: Effort normal and breath sounds normal. No respiratory distress. He has no wheezes. He has no rales. He exhibits no tenderness.  Neurological: He is alert and oriented to person, place, and time.  Skin: Skin is warm and dry. No rash noted.  Psychiatric: Affect normal.  Vitals reviewed.   Recent Results (from the past 2160 hour(s))  Lipid panel     Status: Abnormal   Collection Time: 03/22/15  9:43 AM  Result Value Ref Range   Cholesterol 154 0 - 200 mg/dL    Comment: ATP III Classification       Desirable:  < 200 mg/dL               Borderline High:  200 - 239 mg/dL          High:  > = 240 mg/dL   Triglycerides 222.0 (H) 0.0 - 149.0 mg/dL    Comment: Normal:  <150 mg/dLBorderline High:  150 - 199 mg/dL   HDL 64.80 >39.00 mg/dL   VLDL 44.4 (H) 0.0 - 40.0 mg/dL   Total CHOL/HDL Ratio 2     Comment:                Men          Women1/2 Average Risk     3.4          3.3Average Risk          5.0          4.42X Average Risk          9.6          7.13X Average Risk          15.0          11.0  NonHDL 88.90     Comment: NOTE:  Non-HDL goal should be 30 mg/dL higher than patient's LDL goal (i.e. LDL goal of < 70 mg/dL, would have non-HDL goal of < 100 mg/dL)  AST     Status: None   Collection  Time: 03/22/15  9:43 AM  Result Value Ref Range   AST 28 0 - 37 U/L  ALT     Status: None   Collection Time: 03/22/15  9:43 AM  Result Value Ref Range   ALT 40 0 - 53 U/L  LDL cholesterol, direct     Status: None   Collection Time: 03/22/15  9:43 AM  Result Value Ref Range   Direct LDL 57.0 mg/dL    Comment: Optimal:  <100 mg/dLNear or Above Optimal:  100-129 mg/dLBorderline High:  130-159 mg/dLHigh:  160-189 mg/dLVery High:  >190 mg/dL  TSH     Status: None   Collection Time: 03/22/15  3:10 PM  Result Value Ref Range   TSH 1.20 0.35 - 4.50 uIU/mL    Assessment/Plan: 1. Meniere disease, unspecified laterality Continue Dyazide as directed. Continue Valium PRN. Will fill out FMLA to cover intermittent leave during flares. FU with ENT as scheduled.    Leeanne Rio, PA-C

## 2015-06-01 ENCOUNTER — Encounter: Payer: Self-pay | Admitting: Physician Assistant

## 2015-06-02 ENCOUNTER — Other Ambulatory Visit: Payer: Self-pay | Admitting: Physician Assistant

## 2015-06-02 ENCOUNTER — Telehealth: Payer: Self-pay | Admitting: *Deleted

## 2015-06-02 MED ORDER — DIAZEPAM 5 MG PO TABS
5.0000 mg | ORAL_TABLET | Freq: Two times a day (BID) | ORAL | Status: DC
Start: 2015-06-02 — End: 2015-11-09

## 2015-06-02 NOTE — Telephone Encounter (Signed)
Can be reached: [phone]   Reason for call: Pt returning call. Employee ID # [id number]. Pt states that he talked to [employer] today and was told they are sending new paperwork and we need to make sure it is completed as "short term disability" not for intermittent leave. He previously had intermittent leave and states his boss has called him stating it needs to be straightened out. He's worried about his job. He states STD should be 05/13/15-06/16/15. I scheduled his next follow up appt for 06/16/15. Please let me know if it needs rescheduled for sooner/later.

## 2015-06-02 NOTE — Telephone Encounter (Signed)
Called and [facility] @ 11:13am @ [phone]) asking the pt to RTC regarding needing employee ID# to complete disabililty form.//AB/CMA

## 2015-06-05 ENCOUNTER — Encounter: Payer: Self-pay | Admitting: Physician Assistant

## 2015-06-05 MED FILL — diazePAM 5 MG TABS: 5 | 30 days supply | Qty: 60 | Fill #2

## 2015-06-05 NOTE — Telephone Encounter (Signed)
Paperwork received and placed in PCP red folder for review.

## 2015-06-05 NOTE — Telephone Encounter (Signed)
Called and [facility] @ 4:59pm @ [phone]) asking the pt to RTC regarding note below.//AB/CMA

## 2015-06-05 NOTE — Telephone Encounter (Signed)
I think I have figured out the issue. Please call patient -- The issue is that we recevied FMLA paperwork a week or so ago from employer which [place] filled out and resent. In the mean time it was determined STD was needed for which I saw him in office. I have completed the STD paperwork and we are faxing in. This should take care of everything.  Also if we can get a number for his employer so we can call and explain confusion and that it is being resolved.  Please fax in STD paperwork

## 2015-06-06 ENCOUNTER — Encounter: Payer: Self-pay | Admitting: Physician Assistant

## 2015-06-06 NOTE — Telephone Encounter (Signed)
Called and [facility] @ 7:11pm @ [phone]) asking the pt to RTC regarding the note below.//AB/CMA

## 2015-06-06 NOTE — Telephone Encounter (Addendum)
Pt called again stating he talked to [employer] and his boss. [name] at [facility] [employer] is telling him that the paperwork received today still reads for intermittent leave instead of continuous. She stated they refaxed the original paperwork that was completed and it would need to be corrected and she is faxing a new blank form that can be completed instead if preferred. Phone # for [employer] is [phone]. (I have taken new fax from today directly to [employer])  Pts boss told him they have no evidence that pt has been under treatment of physician and do not have approved FMLA. They are telling him w/in 2 days he may lose his job. Pt asked if we can send a work note at minimum until the [name] papers are corrected and approved. He would be very appreciative if Steve Jacobs would call his Department Director Steve Jacobs or Asst Director Steve Jacobs ph# [phone] and explain he is being treated and there has been confusion with the [facility] paperwork. Pt did not have direct fax # to send work note from MD office to his boss.

## 2015-06-06 NOTE — Telephone Encounter (Signed)
Per Einar Pheasant the pt has been contacted through MyChart regarding the message below.//AB/CMA

## 2015-06-07 ENCOUNTER — Other Ambulatory Visit: Payer: Self-pay | Admitting: Physician Assistant

## 2015-06-07 ENCOUNTER — Encounter: Payer: Self-pay | Admitting: *Deleted

## 2015-06-07 MED FILL — ESCITALOPRAM 20 MG TABLET: 20 | 30 days supply | Qty: 30 | Fill #0

## 2015-06-07 NOTE — Telephone Encounter (Signed)
Called and [facility] @ 9:49am @ [phone]) asking the pt to RTC regarding paperwork.//AB/CMA

## 2015-06-07 NOTE — Telephone Encounter (Signed)
Please call patient and let him know that I have called and left message with his supervisor for call back and further discussion. Verify dates again with him (You can look on my desk for his STD and FMLA paperwork to help as well) so that you can write a work note for him in my absence and fax in for him. I will be unable to sign for paperwork until Friday as I am not in office but will clear everything with his supervisor (I left my cell phone number with him).

## 2015-06-07 NOTE — Telephone Encounter (Signed)
Spoke with the pt and informed him of Cody's note below, and also read him the work excuse letter that was faxed to [employer]. Also informed him that we did not have a department fax number to fax the letter to.  Pt verbalized understanding and called and was able to get me a fax number to his department at work.  Word excuse letter was faxed to (#[phone])-Attn:Mark Young.  Confirmation received.//AB/CMA

## 2015-06-07 NOTE — Telephone Encounter (Signed)
Rx sent to the pharmacy by e-script.//AB/CMA 

## 2015-06-07 NOTE — Telephone Encounter (Signed)
Work excuse letter faxed to [employer], ATTNNeysa Hotter, [phone]. Fax confirmation received. We do not have fax # for his department on file and pt has not returned call.

## 2015-06-09 ENCOUNTER — Other Ambulatory Visit: Payer: Self-pay | Admitting: Physician Assistant

## 2015-06-09 ENCOUNTER — Telehealth: Payer: Self-pay | Admitting: Physician Assistant

## 2015-06-09 NOTE — Telephone Encounter (Signed)
Received MyChart message, forwarded to [place].

## 2015-06-09 NOTE — Telephone Encounter (Signed)
Caller name: Self  Can be reached: [phone]  Pharmacy:  [facility], [place]. [phone] (Phone) [phone] (Fax)         Reason for call: Request that rx for Bustar be sent in to pharmacy. States he received a message via My Chart that he needed to start this medication

## 2015-06-14 MED ORDER — BUSPIRONE HCL 7.5 MG PO TABS: 7.5000 mg | ORAL_TABLET | Freq: Two times a day (BID) | ORAL | Status: DC

## 2015-06-14 MED FILL — busPIRone HCL 7.5 MG TABS: 7.5 | 30 days supply | Qty: 60 | Fill #0 | Status: TO

## 2015-06-14 NOTE — Addendum Note (Signed)
Addended by: Raiford Noble on: 06/14/2015 04:51 PM   Modules accepted: Orders

## 2015-06-16 ENCOUNTER — Encounter: Payer: Self-pay | Admitting: Physician Assistant

## 2015-06-16 ENCOUNTER — Ambulatory Visit (INDEPENDENT_AMBULATORY_CARE_PROVIDER_SITE_OTHER): Payer: 59 | Admitting: Physician Assistant

## 2015-06-16 ENCOUNTER — Ambulatory Visit: Payer: 59 | Admitting: Physician Assistant

## 2015-06-16 VITALS — BP 98/70 | HR 90 | Temp 97.9°F | Resp 16 | Ht 70.0 in | Wt 213.2 lb

## 2015-06-16 DIAGNOSIS — F329 Major depressive disorder, single episode, unspecified: Secondary | ICD-10-CM

## 2015-06-16 DIAGNOSIS — F32A Depression, unspecified: Secondary | ICD-10-CM

## 2015-06-16 DIAGNOSIS — I10 Essential (primary) hypertension: Secondary | ICD-10-CM

## 2015-06-16 DIAGNOSIS — H8109 Meniere's disease, unspecified ear: Secondary | ICD-10-CM | POA: Diagnosis not present

## 2015-06-16 NOTE — Progress Notes (Signed)
Pre visit review using our clinic review tool, if applicable. No additional management support is needed unless otherwise documented below in the visit note/SLS  

## 2015-06-16 NOTE — Progress Notes (Signed)
Patient presents to clinic today for follow-up of multiple issues to verify he is ready to return to work.  Anxiety and Depression -- Patient recently started on BuSpar 7.5 mg BID in addition to his chronic Lexapro. Endorses taking as directed without side effect. Endorses medication is helping considerably with anxiety. He is more outgoing and not feeling overwhelmed by tasks.   Meniere's Disease -- Is taking the Dyazide given by ENT. Endorses no residual bouts of dizziness or ear pressure. Denies noted side effects of medication. Has continued chronic lisinopril for HCTZ.   BP Readings from Last 3 Encounters:  06/16/15 98/70  05/31/15 122/88  03/22/15 130/82    Past Medical History  Diagnosis Date  . Asthma   . Meniere's disease   . Environmental and seasonal allergies   . Depression     Resolved  . Chicken pox   . Hyperlipidemia   . Essential hypertension, benign 08/03/2013  . Thoracic outlet syndrome     Current Outpatient Prescriptions on File Prior to Visit  Medication Sig Dispense Refill  . atorvastatin (LIPITOR) 20 MG tablet Take 1 tablet (20 mg total) by mouth daily at 6 PM. 30 tablet 5  . budesonide-formoterol (SYMBICORT) 80-4.5 MCG/ACT inhaler Inhale 2 puffs into the lungs 2 (two) times daily. (Patient taking differently: Inhale 2 puffs into the lungs at bedtime. ) 1 Inhaler 6  . busPIRone (BUSPAR) 7.5 MG tablet Take 1 tablet (7.5 mg total) by mouth 2 (two) times daily. 60 tablet 1  . cetirizine (ZYRTEC) 10 MG tablet Take 10 mg by mouth at bedtime.     . diazepam (VALIUM) 5 MG tablet Take 1 tablet (5 mg total) by mouth 2 (two) times daily. 60 tablet 2  . diphenhydrAMINE (BENADRYL) 25 MG tablet Take 25 mg by mouth every 6 (six) hours as needed for itching or allergies.    Marland Kitchen escitalopram (LEXAPRO) 20 MG tablet TAKE 1 TABLET BY MOUTH DAILY. 30 tablet 3  . fenofibrate (TRICOR) 145 MG tablet TAKE 1 TABLET (145 MG TOTAL) BY MOUTH DAILY. 30 tablet 5  . ibuprofen  (ADVIL,MOTRIN) 200 MG tablet Take 400 mg by mouth every 8 (eight) hours as needed for mild pain or moderate pain. Reported on 05/31/2015    . ibuprofen (ADVIL,MOTRIN) 800 MG tablet Take 800 mg by mouth every 8 (eight) hours as needed for headache.    Vanessa Kick Ethyl 1 G CAPS Take 2 g by mouth 2 (two) times daily. 120 capsule 5  . lisinopril (PRINIVIL,ZESTRIL) 20 MG tablet Take 1 tablet by mouth daily. 90 tablet 1  . MAGNESIUM GLYCINATE PLUS PO Take 500 mg by mouth 2 (two) times daily.    . metoprolol succinate (TOPROL-XL) 25 MG 24 hr tablet Take 1 tablet (25 mg total) by mouth daily. 30 tablet 5  . montelukast (SINGULAIR) 10 MG tablet TAKE 1 TABLET BY MOUTH AT BEDTIME. 90 tablet 1  . Multiple Vitamin (MULTIVITAMIN WITH MINERALS) TABS tablet Take 1 tablet by mouth daily.    Marland Kitchen triamterene-hydrochlorothiazide (DYAZIDE) 37.5-25 MG capsule Take 1 each (1 capsule total) by mouth daily.    . VENTOLIN HFA 108 (90 Base) MCG/ACT inhaler INHALE 2 PUFFS BY MOUTH EVERY 6 HOURS AS NEEDED FOR WHEEZING 18 g 2   No current facility-administered medications on file prior to visit.    Allergies  Allergen Reactions  . Belsomra [Suvorexant] Other (See Comments)    nightmares  . Percocet [Oxycodone-Acetaminophen] Nausea And Vomiting    Family History  Problem Relation Age of Onset  . Hyperlipidemia Father     Living  . Stroke Father   . Hypertension Father   . Alcohol abuse Mother     Living  . Diabetes Mellitus II Maternal Grandfather   . Hypertension Maternal Grandfather   . Heart failure Maternal Grandfather   . Kidney disease Maternal Grandfather   . Heart disease Maternal Grandmother   . Melanoma Paternal Grandmother   . Heart attack Paternal Grandfather   . Migraines Brother   . Healthy Brother     x2  . Drug abuse Sister     Died of Overdose    Social History   Social History  . Marital Status: Single    Spouse Name: N/A  . Number of Children: N/A  . Years of Education: N/A    Social History Main Topics  . Smoking status: Never Smoker   . Smokeless tobacco: Never Used  . Alcohol Use: 0.0 oz/week    0 Standard drinks or equivalent per week     Comment: 2-3 beers nightly  . Drug Use: No  . Sexual Activity: Yes    Birth Control/ Protection: None     Comment: male - 1 partner   Other Topics Concern  . None   Social History Narrative    Review of Systems - See HPI.  All other ROS are negative.  BP 98/70 mmHg  Pulse 90  Temp(Src) 97.9 F (36.6 C) (Oral)  Resp 16  Ht 5\' 10"  (1.778 m)  Wt 213 lb 4 oz (96.73 kg)  BMI 30.60 kg/m2  SpO2 99%  Physical Exam  Constitutional: He is oriented to person, place, and time and well-developed, well-nourished, and in no distress.  HENT:  Head: Normocephalic and atraumatic.  Cardiovascular: Normal rate, regular rhythm, normal heart sounds and intact distal pulses.   Pulmonary/Chest: Effort normal and breath sounds normal. No respiratory distress. He has no wheezes. He has no rales. He exhibits no tenderness.  Neurological: He is alert and oriented to person, place, and time.  Skin: Skin is warm and dry. No rash noted.  Psychiatric: Affect normal.  Vitals reviewed.   Recent Results (from the past 2160 hour(s))  Lipid panel     Status: Abnormal   Collection Time: 03/22/15  9:43 AM  Result Value Ref Range   Cholesterol 154 0 - 200 mg/dL    Comment: ATP III Classification       Desirable:  < 200 mg/dL               Borderline High:  200 - 239 mg/dL          High:  > = 240 mg/dL   Triglycerides 222.0 (H) 0.0 - 149.0 mg/dL    Comment: Normal:  <150 mg/dLBorderline High:  150 - 199 mg/dL   HDL 64.80 >39.00 mg/dL   VLDL 44.4 (H) 0.0 - 40.0 mg/dL   Total CHOL/HDL Ratio 2     Comment:                Men          Women1/2 Average Risk     3.4          3.3Average Risk          5.0          4.42X Average Risk          9.6          7.13X Average Risk  15.0          11.0                       NonHDL 88.90      Comment: NOTE:  Non-HDL goal should be 30 mg/dL higher than patient's LDL goal (i.e. LDL goal of < 70 mg/dL, would have non-HDL goal of < 100 mg/dL)  AST     Status: None   Collection Time: 03/22/15  9:43 AM  Result Value Ref Range   AST 28 0 - 37 U/L  ALT     Status: None   Collection Time: 03/22/15  9:43 AM  Result Value Ref Range   ALT 40 0 - 53 U/L  LDL cholesterol, direct     Status: None   Collection Time: 03/22/15  9:43 AM  Result Value Ref Range   Direct LDL 57.0 mg/dL    Comment: Optimal:  <100 mg/dLNear or Above Optimal:  100-129 mg/dLBorderline High:  130-159 mg/dLHigh:  160-189 mg/dLVery High:  >190 mg/dL  TSH     Status: None   Collection Time: 03/22/15  3:10 PM  Result Value Ref Range   TSH 1.20 0.35 - 4.50 uIU/mL    Assessment/Plan: Meniere disease Doing very well on current regimen. Continue Dyazide. Will stop lisinopril due to low BP. Follow-up with ENT as scheduled.  Essential hypertension, benign Will stop lisinopril. Will continue Dyazide. FU 1 month for reassessment.  Depression Much improved with combination of Lexapro and BuSpar. Continue as directed.

## 2015-06-16 NOTE — Patient Instructions (Signed)
Please stop the lisinopril. The combination of the lisinopril with the new med given by ENT is too much. Continue the Triamterene-Hydrochlorothiazide daily.  Continue the BuSpar and Lexapro as directed. Follow-up with me in 1 month.

## 2015-06-16 NOTE — Assessment & Plan Note (Addendum)
Doing very well on current regimen. Continue Dyazide. Will stop lisinopril due to low BP. Follow-up with ENT as scheduled.

## 2015-06-16 NOTE — Assessment & Plan Note (Signed)
Will stop lisinopril. Will continue Dyazide. FU 1 month for reassessment.

## 2015-06-16 NOTE — Assessment & Plan Note (Signed)
Much improved with combination of Lexapro and BuSpar. Continue as directed.

## 2015-06-25 ENCOUNTER — Encounter: Payer: Self-pay | Admitting: Physician Assistant

## 2015-06-27 MED FILL — MONTELUKAST SOD 10 MG TAB: 10 | 90 days supply | Qty: 90 | Fill #1

## 2015-06-28 ENCOUNTER — Ambulatory Visit: Payer: 59 | Admitting: Physician Assistant

## 2015-06-28 ENCOUNTER — Telehealth: Payer: Self-pay | Admitting: Physician Assistant

## 2015-06-28 DIAGNOSIS — Z0289 Encounter for other administrative examinations: Secondary | ICD-10-CM

## 2015-06-29 ENCOUNTER — Encounter: Payer: Self-pay | Admitting: Physician Assistant

## 2015-07-03 MED FILL — TRIAMTERENE/HCTZ 37.5/25 CP: 37.5-25 | 90 days supply | Qty: 90 | Fill #1

## 2015-07-03 NOTE — Telephone Encounter (Signed)
charge 

## 2015-07-03 NOTE — Telephone Encounter (Signed)
Pt was no show 06/28/15 10:30am acute appt, per notes on appt pt left msg on VM at 3:45pm same day to cancel appt, multiple no shows and cancellations, charge or no charge?

## 2015-07-05 ENCOUNTER — Encounter: Payer: Self-pay | Admitting: Physician Assistant

## 2015-07-05 NOTE — Telephone Encounter (Signed)
Marked to charge and mailing no show letter °

## 2015-07-10 MED FILL — ESCITALOPRAM 20 MG TABLET: 20 | 30 days supply | Qty: 30 | Fill #1

## 2015-07-10 MED FILL — FENOFIBRATE 145 MG TABLET: 145 | 30 days supply | Qty: 30 | Fill #5

## 2015-07-10 MED FILL — diazePAM 5 MG TABS: 5 | 30 days supply | Qty: 60 | Fill #0

## 2015-07-10 MED FILL — busPIRone HCL 7.5 MG TABS: 7.5 | 30 days supply | Qty: 60 | Fill #0

## 2015-08-09 ENCOUNTER — Other Ambulatory Visit: Payer: Self-pay | Admitting: Physician Assistant

## 2015-08-21 MED FILL — VASCEPA 1 GM CAPSULE: 1 | 30 days supply | Qty: 120 | Fill #5

## 2015-08-21 MED FILL — diazePAM 5 MG TABS: 5 | 30 days supply | Qty: 60 | Fill #1

## 2015-08-21 MED FILL — METOPROLOL SUCC ER 25 MG TA: 25 | 30 days supply | Qty: 30 | Fill #0

## 2015-08-21 MED FILL — ESCITALOPRAM 20 MG TABLET: 20 | 30 days supply | Qty: 30 | Fill #2

## 2015-08-21 MED FILL — FENOFIBRATE 145 MG TABLET: 145 | 30 days supply | Qty: 30 | Fill #0

## 2015-08-21 MED FILL — VENTOLIN HFA 90 MCG INHALER: 108 (90 BAS | 25 days supply | Qty: 18 | Fill #2

## 2015-08-24 ENCOUNTER — Encounter: Payer: Self-pay | Admitting: Physician Assistant

## 2015-08-31 ENCOUNTER — Encounter: Payer: Self-pay | Admitting: Physician Assistant

## 2015-09-06 ENCOUNTER — Ambulatory Visit (INDEPENDENT_AMBULATORY_CARE_PROVIDER_SITE_OTHER): Payer: 59 | Admitting: Physician Assistant

## 2015-09-06 ENCOUNTER — Encounter: Payer: Self-pay | Admitting: Physician Assistant

## 2015-09-06 VITALS — BP 136/92 | HR 90 | Temp 98.0°F | Resp 16 | Ht 70.0 in | Wt 216.2 lb

## 2015-09-06 DIAGNOSIS — R5383 Other fatigue: Secondary | ICD-10-CM

## 2015-09-06 DIAGNOSIS — Z113 Encounter for screening for infections with a predominantly sexual mode of transmission: Secondary | ICD-10-CM | POA: Diagnosis not present

## 2015-09-06 NOTE — Progress Notes (Signed)
Pre visit review using our clinic review tool, if applicable. No additional management support is needed unless otherwise documented below in the visit note/SLS  

## 2015-09-06 NOTE — Progress Notes (Signed)
Patient presents to clinic today c/o 2 weeks of fatigue described as feeling too tired to get out of bed. Denies any worsening mood or anxiety. Actually feels in a great mood overall. Denies change to stress, diet or exercise regimen. Denies any racing heart, lightheadedness or dizziness. Denies URI or abdominal symptoms. No change to bowel or bladder habits.  Patient is requesting STI screen. Is not currently sexually active. Previously in a monogamous relationship with male partner. Is now single and would like things checked before starting dating again.  Past Medical History:  Diagnosis Date  . Asthma   . Chicken pox   . Depression    Resolved  . Environmental and seasonal allergies   . Essential hypertension, benign 08/03/2013  . Hyperlipidemia   . Meniere's disease   . Thoracic outlet syndrome     Current Outpatient Prescriptions on File Prior to Visit  Medication Sig Dispense Refill  . atorvastatin (LIPITOR) 20 MG tablet Take 1 tablet (20 mg total) by mouth daily at 6 PM. 30 tablet 5  . budesonide-formoterol (SYMBICORT) 80-4.5 MCG/ACT inhaler Inhale 2 puffs into the lungs 2 (two) times daily. (Patient taking differently: Inhale 2 puffs into the lungs at bedtime. ) 1 Inhaler 6  . busPIRone (BUSPAR) 7.5 MG tablet Take 1 tablet (7.5 mg total) by mouth 2 (two) times daily. 60 tablet 1  . cetirizine (ZYRTEC) 10 MG tablet Take 10 mg by mouth at bedtime.     . diazepam (VALIUM) 5 MG tablet Take 1 tablet (5 mg total) by mouth 2 (two) times daily. 60 tablet 2  . diphenhydrAMINE (BENADRYL) 25 MG tablet Take 25 mg by mouth every 6 (six) hours as needed for itching or allergies.    Marland Kitchen escitalopram (LEXAPRO) 20 MG tablet TAKE 1 TABLET BY MOUTH DAILY. 30 tablet 3  . fenofibrate (TRICOR) 145 MG tablet TAKE 1 TABLET (145 MG TOTAL) BY MOUTH DAILY. 30 tablet 5  . ibuprofen (ADVIL,MOTRIN) 200 MG tablet Take 400 mg by mouth every 8 (eight) hours as needed for mild pain or moderate pain. Reported on  05/31/2015    . ibuprofen (ADVIL,MOTRIN) 800 MG tablet Take 800 mg by mouth every 8 (eight) hours as needed for headache.    Vanessa Kick Ethyl 1 G CAPS Take 2 g by mouth 2 (two) times daily. 120 capsule 5  . lisinopril (PRINIVIL,ZESTRIL) 20 MG tablet Take 1 tablet by mouth daily. 90 tablet 1  . metoprolol succinate (TOPROL-XL) 25 MG 24 hr tablet TAKE 1 TABLET BY MOUTH DAILY. 30 tablet 5  . montelukast (SINGULAIR) 10 MG tablet TAKE 1 TABLET BY MOUTH AT BEDTIME. 90 tablet 1  . Multiple Vitamin (MULTIVITAMIN WITH MINERALS) TABS tablet Take 1 tablet by mouth daily.    Marland Kitchen triamterene-hydrochlorothiazide (DYAZIDE) 37.5-25 MG capsule Take 1 each (1 capsule total) by mouth daily.    . VENTOLIN HFA 108 (90 Base) MCG/ACT inhaler INHALE 2 PUFFS BY MOUTH EVERY 6 HOURS AS NEEDED FOR WHEEZING 18 g 2   No current facility-administered medications on file prior to visit.     Allergies  Allergen Reactions  . Belsomra [Suvorexant] Other (See Comments)    nightmares  . Percocet [Oxycodone-Acetaminophen] Nausea And Vomiting    Family History  Problem Relation Age of Onset  . Hyperlipidemia Father     Living  . Stroke Father   . Hypertension Father   . Alcohol abuse Mother     Living  . Diabetes Mellitus II Maternal Grandfather   .  Hypertension Maternal Grandfather   . Heart failure Maternal Grandfather   . Kidney disease Maternal Grandfather   . Heart disease Maternal Grandmother   . Melanoma Paternal Grandmother   . Heart attack Paternal Grandfather   . Migraines Brother   . Healthy Brother     x2  . Drug abuse Sister     Died of Overdose    Social History   Social History  . Marital status: Single    Spouse name: N/A  . Number of children: N/A  . Years of education: N/A   Social History Main Topics  . Smoking status: Never Smoker  . Smokeless tobacco: Never Used  . Alcohol use 0.0 oz/week     Comment: 2-3 beers nightly  . Drug use: No  . Sexual activity: Yes    Birth control/  protection: None     Comment: male - 1 partner   Other Topics Concern  . None   Social History Narrative  . None   Review of Systems - See HPI.  All other ROS are negative.  BP (!) 136/92 (BP Location: Left Arm, Patient Position: Sitting, Cuff Size: Large)   Pulse 90   Temp 98 F (36.7 C) (Oral)   Resp 16   Ht 5' 10"  (1.778 m)   Wt 216 lb 4 oz (98.1 kg)   SpO2 98%   BMI 31.03 kg/m   Physical Exam  Constitutional: He is oriented to person, place, and time and well-developed, well-nourished, and in no distress.  HENT:  Head: Normocephalic and atraumatic.  Right Ear: External ear normal.  Left Ear: External ear normal.  Nose: Nose normal.  Mouth/Throat: Oropharynx is clear and moist. No oropharyngeal exudate.  TM within normal limits bilaterally.  Eyes: Conjunctivae are normal. Pupils are equal, round, and reactive to light.  Neck: Neck supple. No thyromegaly present.  Cardiovascular: Normal rate, regular rhythm, normal heart sounds and intact distal pulses.   Pulmonary/Chest: Effort normal and breath sounds normal. No respiratory distress. He has no wheezes. He has no rales. He exhibits no tenderness.  Neurological: He is alert and oriented to person, place, and time.  Skin: Skin is warm. No rash noted.  Vitals reviewed.  Assessment/Plan: 1. Screen for STD (sexually transmitted disease) Asymptomatic. MSM with one prior monogamous partner. Will obtain routine panel today. - HIV antibody - GC/chlamydia probe amp, urine - RPR - HSV(herpes smplx)abs-1+2(IgG+IgM)-bld  2. Other fatigue Unclear. Exam unremarkable. Will obtain lab panel.  - CBC with Differential/Platelet - Comprehensive metabolic panel - TSH - T4, free - Urinalysis, Routine w reflex microscopic - Sed Rate (ESR)   Leeanne Rio, PA-C

## 2015-09-06 NOTE — Addendum Note (Signed)
Addended by: Caffie Pinto on: 09/06/2015 05:04 PM   Modules accepted: Orders

## 2015-09-06 NOTE — Patient Instructions (Signed)
Please go to the lab for blood work. I will call with your results. Stay hydrated, continue a well-balanced diet, and get plenty of rest.   We will alter your regimen based on results.

## 2015-09-07 ENCOUNTER — Encounter: Payer: Self-pay | Admitting: Physician Assistant

## 2015-09-07 ENCOUNTER — Telehealth: Payer: 59 | Admitting: Physician Assistant

## 2015-09-07 DIAGNOSIS — M5440 Lumbago with sciatica, unspecified side: Secondary | ICD-10-CM

## 2015-09-07 LAB — CBC WITH DIFFERENTIAL/PLATELET
BASOS ABS: 0 10*3/uL (ref 0.0–0.1)
Basophils Relative: 1 % (ref 0.0–3.0)
Eosinophils Absolute: 0.3 10*3/uL (ref 0.0–0.7)
Eosinophils Relative: 6.9 % — ABNORMAL HIGH (ref 0.0–5.0)
HCT: 43 % (ref 39.0–52.0)
Hemoglobin: 14.9 g/dL (ref 13.0–17.0)
LYMPHS ABS: 1.6 10*3/uL (ref 0.7–4.0)
LYMPHS PCT: 32.1 % (ref 12.0–46.0)
MCHC: 34.7 g/dL (ref 30.0–36.0)
MCV: 82.3 fl (ref 78.0–100.0)
MONO ABS: 0.5 10*3/uL (ref 0.1–1.0)
MONOS PCT: 9.6 % (ref 3.0–12.0)
NEUTROS PCT: 50.4 % (ref 43.0–77.0)
Neutro Abs: 2.5 10*3/uL (ref 1.4–7.7)
Platelets: 242 10*3/uL (ref 150.0–400.0)
RBC: 5.23 Mil/uL (ref 4.22–5.81)
RDW: 13 % (ref 11.5–15.5)
WBC: 4.9 10*3/uL (ref 4.0–10.5)

## 2015-09-07 LAB — URINALYSIS, ROUTINE W REFLEX MICROSCOPIC
BILIRUBIN URINE: NEGATIVE
Hgb urine dipstick: NEGATIVE
KETONES UR: NEGATIVE
LEUKOCYTES UA: NEGATIVE
NITRITE: NEGATIVE
SPECIFIC GRAVITY, URINE: 1.02 (ref 1.000–1.030)
Total Protein, Urine: NEGATIVE
URINE GLUCOSE: NEGATIVE
UROBILINOGEN UA: 0.2 (ref 0.0–1.0)
pH: 6 (ref 5.0–8.0)

## 2015-09-07 LAB — COMPREHENSIVE METABOLIC PANEL
ALK PHOS: 55 U/L (ref 39–117)
ALT: 25 U/L (ref 0–53)
AST: 23 U/L (ref 0–37)
Albumin: 4.9 g/dL (ref 3.5–5.2)
BILIRUBIN TOTAL: 0.4 mg/dL (ref 0.2–1.2)
BUN: 13 mg/dL (ref 6–23)
CO2: 26 mEq/L (ref 19–32)
CREATININE: 1.08 mg/dL (ref 0.40–1.50)
Calcium: 9.6 mg/dL (ref 8.4–10.5)
Chloride: 103 mEq/L (ref 96–112)
GFR: 85.25 mL/min (ref >60.00)
GLUCOSE: 91 mg/dL (ref 70–99)
Potassium: 4.8 mEq/L (ref 3.5–5.1)
SODIUM: 139 meq/L (ref 135–145)
TOTAL PROTEIN: 7.8 g/dL (ref 6.0–8.3)

## 2015-09-07 LAB — SEDIMENTATION RATE: SED RATE: 3 mm/h (ref 0–15)

## 2015-09-07 LAB — T4, FREE: FREE T4: 0.78 ng/dL (ref 0.60–1.60)

## 2015-09-07 LAB — TSH: TSH: 1.3 u[IU]/mL (ref 0.35–4.50)

## 2015-09-07 MED ORDER — METHYLPREDNISOLONE 4 MG PO TBPK: ORAL_TABLET | ORAL | 0 refills | Status: DC

## 2015-09-07 MED ORDER — CYCLOBENZAPRINE HCL 10 MG PO TABS: 10.0000 mg | ORAL_TABLET | Freq: Three times a day (TID) | ORAL | 0 refills | Status: DC | PRN

## 2015-09-07 MED FILL — METHYLPREDNISOLONE 4 MG TAB: 4 | 6 days supply | Qty: 21 | Fill #0

## 2015-09-07 MED FILL — CYCLOBENZAPRINE 10 MG TAB: 10 | 10 days supply | Qty: 30 | Fill #0

## 2015-09-07 NOTE — Progress Notes (Signed)

## 2015-09-08 ENCOUNTER — Encounter: Payer: Self-pay | Admitting: Physician Assistant

## 2015-09-08 ENCOUNTER — Telehealth: Payer: Self-pay

## 2015-09-08 LAB — GC/CHLAMYDIA PROBE AMP
CT Probe RNA: NOT DETECTED
GC Probe RNA: NOT DETECTED

## 2015-09-08 LAB — HIV ANTIBODY (ROUTINE TESTING W REFLEX): HIV 1&2 Ab, 4th Generation: NONREACTIVE

## 2015-09-08 LAB — RPR

## 2015-09-08 NOTE — Telephone Encounter (Signed)
FMLA paperwork needs resubmission.  Re:  FMLA paperwork for excessive parameters for Leave [id number].  Advise if an update to the patient's parameters is necessary going forward.

## 2015-09-11 LAB — HSV(HERPES SMPLX)ABS-I+II(IGG+IGM)-BLD
HERPES SIMPLEX VRS I-IGM AB (EIA): 0.94 {index}
HSV 1 Glycoprotein G Ab, IgG: 40.7 Index — ABNORMAL HIGH (ref <0.90)
HSV 2 Glycoprotein G Ab, IgG: <0.9 Index (ref <0.90)

## 2015-09-16 ENCOUNTER — Encounter: Payer: Self-pay | Admitting: Physician Assistant

## 2015-09-16 DIAGNOSIS — Z1283 Encounter for screening for malignant neoplasm of skin: Secondary | ICD-10-CM

## 2015-09-22 ENCOUNTER — Other Ambulatory Visit: Payer: Self-pay | Admitting: Physician Assistant

## 2015-09-22 MED FILL — MONTELUKAST SOD 10 MG TAB: 10 | 90 days supply | Qty: 90 | Fill #0

## 2015-09-22 MED FILL — METOPROLOL SUCC ER 25 MG TA: 25 | 30 days supply | Qty: 30 | Fill #1

## 2015-09-22 MED FILL — busPIRone HCL 7.5 MG TABS: 7.5 | 45 days supply | Qty: 90 | Fill #0

## 2015-09-22 MED FILL — FENOFIBRATE 145 MG TABLET: 145 | 30 days supply | Qty: 30 | Fill #1

## 2015-09-22 MED FILL — diazePAM 5 MG TABS: 5 | 30 days supply | Qty: 60 | Fill #2

## 2015-09-22 MED FILL — ESCITALOPRAM 20 MG TABLET: 20 | 30 days supply | Qty: 30 | Fill #3

## 2015-09-22 MED FILL — TRIAMTERENE/HCTZ 37.5/25 CP: 37.5-25 | 60 days supply | Qty: 60 | Fill #2

## 2015-09-22 MED FILL — VASCEPA 1 GM CAPSULE: 1 | 30 days supply | Qty: 120 | Fill #0

## 2015-09-22 MED FILL — ATORVASTATIN 20 MG TABLET: 20 | 90 days supply | Qty: 90 | Fill #1

## 2015-09-22 NOTE — Telephone Encounter (Signed)
Rx request to pharmacy; 90-day supply per provider VO/SLS 09/08

## 2015-10-05 ENCOUNTER — Telehealth: Payer: Self-pay | Admitting: Physician Assistant

## 2015-10-05 NOTE — Telephone Encounter (Signed)
Pt came in office and dropped off some paperwork to be filled out by PCP (Document is -Employee Medical Certification Form in Response to an Accommodation Request- document 11 pages) Pt also has an explanation letter on what to file out. Document put at front office tray. Pt also mentioned time line to have it sent is til 10-14-15.

## 2015-10-07 ENCOUNTER — Encounter: Payer: Self-pay | Admitting: Physician Assistant

## 2015-10-10 NOTE — Telephone Encounter (Signed)
Form noted and placed in Cody's blue folder for review and completion.

## 2015-10-11 NOTE — Telephone Encounter (Signed)
Received forms and completed. Have faxed to [employer]. I will leave a copy in my office for a few weeks to verify all was received, then will send to scan. A copy was also made and mailed to patient.

## 2015-11-09 ENCOUNTER — Encounter: Payer: Self-pay | Admitting: *Deleted

## 2015-11-09 ENCOUNTER — Other Ambulatory Visit: Payer: Self-pay | Admitting: Physician Assistant

## 2015-11-09 MED FILL — ESCITALOPRAM 20 MG TABLET: 20 | 30 days supply | Qty: 30 | Fill #0

## 2015-11-09 MED FILL — METOPROLOL SUCC ER 25 MG TA: 25 | 30 days supply | Qty: 30 | Fill #2

## 2015-11-09 NOTE — Telephone Encounter (Signed)
Rx printed, to PCP for signature. No controlled substance contract on file. Printed for pt and attached to rx. Pt will pick up in office, sign contract, and leave UDS. He is working the next few days, so it might be next week before he picks up.

## 2015-11-09 NOTE — Telephone Encounter (Signed)
Lexapro filled. Please advise diazepam.   Last OV: 09/06/15 Last filled: 06/02/15, #60, 2 RF Sig: Take 1 tablet (5 mg total) by mouth 2 (two) times daily.  UDS: Not on file

## 2015-11-09 NOTE — Telephone Encounter (Signed)
OK to grant refill. Same signature and quantity. Patient needs to come pick up and give UDS.

## 2015-11-10 MED FILL — TRIAMTERENE/HCTZ 37.5/25 CP: 37.5-25 | 30 days supply | Qty: 30 | Fill #0

## 2015-11-30 MED FILL — diazePAM 5 MG TABS: 5 | 30 days supply | Qty: 60 | Fill #0 | Status: TO

## 2015-12-01 ENCOUNTER — Encounter: Payer: Self-pay | Admitting: Physician Assistant

## 2015-12-01 DIAGNOSIS — H8109 Meniere's disease, unspecified ear: Secondary | ICD-10-CM | POA: Diagnosis not present

## 2015-12-01 DIAGNOSIS — Z5181 Encounter for therapeutic drug level monitoring: Secondary | ICD-10-CM | POA: Diagnosis not present

## 2015-12-01 DIAGNOSIS — F411 Generalized anxiety disorder: Secondary | ICD-10-CM | POA: Diagnosis not present

## 2015-12-01 MED ORDER — ONDANSETRON 4 MG PO TBDP: 4.0000 mg | ORAL_TABLET | Freq: Three times a day (TID) | ORAL | 0 refills | Status: DC | PRN

## 2015-12-01 MED FILL — ONDANSETRON ODT 4 MG TABLET: 4 | 6 days supply | Qty: 20 | Fill #0

## 2015-12-04 MED FILL — VASCEPA 1 GM CAPSULE: 1 | 90 days supply | Qty: 360 | Fill #1

## 2015-12-04 MED FILL — TRIAMTERENE/HCTZ 37.5/25 CP: 37.5-25 | 90 days supply | Qty: 90 | Fill #1

## 2015-12-04 MED FILL — FENOFIBRATE 145 MG TABLET: 145 | 90 days supply | Qty: 90 | Fill #2

## 2015-12-05 ENCOUNTER — Ambulatory Visit: Payer: 59 | Admitting: Physician Assistant

## 2015-12-05 ENCOUNTER — Other Ambulatory Visit: Payer: Self-pay | Admitting: Physician Assistant

## 2015-12-06 ENCOUNTER — Ambulatory Visit: Payer: 59 | Admitting: Physician Assistant

## 2015-12-06 ENCOUNTER — Other Ambulatory Visit: Payer: Self-pay | Admitting: *Deleted

## 2015-12-06 MED ORDER — ESCITALOPRAM OXALATE 20 MG PO TABS: 20.0000 mg | ORAL_TABLET | Freq: Every day | ORAL | 0 refills | Status: DC

## 2015-12-06 MED ORDER — BUSPIRONE HCL 7.5 MG PO TABS: 7.5000 mg | ORAL_TABLET | Freq: Two times a day (BID) | ORAL | 0 refills | Status: DC

## 2015-12-06 MED FILL — busPIRone HCL 7.5 MG TABS: 7.5 | 90 days supply | Qty: 180 | Fill #0

## 2015-12-06 NOTE — Progress Notes (Signed)
Request from pharmacy for new 90-day supply on medications; Refill sent per [facility] refill protocol/SLS 11/22

## 2015-12-12 ENCOUNTER — Ambulatory Visit (INDEPENDENT_AMBULATORY_CARE_PROVIDER_SITE_OTHER): Payer: 59 | Admitting: Physician Assistant

## 2015-12-12 ENCOUNTER — Encounter: Payer: Self-pay | Admitting: Physician Assistant

## 2015-12-12 VITALS — BP 118/80 | HR 95 | Temp 98.2°F | Resp 16 | Ht 70.0 in | Wt 210.0 lb

## 2015-12-12 DIAGNOSIS — F329 Major depressive disorder, single episode, unspecified: Secondary | ICD-10-CM | POA: Diagnosis not present

## 2015-12-12 DIAGNOSIS — H8109 Meniere's disease, unspecified ear: Secondary | ICD-10-CM

## 2015-12-12 DIAGNOSIS — F32A Depression, unspecified: Secondary | ICD-10-CM

## 2015-12-12 MED ORDER — ESCITALOPRAM OXALATE 10 MG PO TABS: 10.0000 mg | ORAL_TABLET | Freq: Every day | ORAL | 1 refills | Status: DC

## 2015-12-12 MED FILL — ESCITALOPRAM 10 MG TABLET: 10 | 30 days supply | Qty: 30 | Fill #0

## 2015-12-12 NOTE — Patient Instructions (Signed)
Please start the new dose of Lexapro (10 mg) daily. See if this still jeeps good control of mood but gets rid of some of the sexual side effects you have noted.   Let me know how this is doing. We can also add-on Wellbutrin if needed to help further.  I have completed and faxed in your paperwork.   Follow-up in 1 month.

## 2015-12-12 NOTE — Progress Notes (Signed)
Patient presents to clinic today to discuss paperwork (FMLA and ADA accommodations) for Meniere's disease.  Patient with long-standing history. Followed by ENT. Is currently on Dyazide for symptom management. Has Rx for Diazepam to use during acute flares. Flare-ups thankfully have become much less frequent, occurring about once to twice month. Valium helps greatly during flares. Patient is watching salt intake. Denies new or worsening symptoms.   Patient also notes some recent issue with libido and erectile dysfunction and is concerned that Lexapro may be contributing. Denies noting any performance anxiety prior to the issue in question. Denies any trauma or injury to the penis. Denies any breakthrough depressed mood or depression with current dose of Lexapro.   Past Medical History:  Diagnosis Date  . Asthma   . Chicken pox   . Depression    Resolved  . Environmental and seasonal allergies   . Essential hypertension, benign 08/03/2013  . Hyperlipidemia   . Meniere's disease   . Thoracic outlet syndrome     Current Outpatient Prescriptions on File Prior to Visit  Medication Sig Dispense Refill  . atorvastatin (LIPITOR) 20 MG tablet Take 1 tablet (20 mg total) by mouth daily at 6 PM. 30 tablet 5  . budesonide-formoterol (SYMBICORT) 80-4.5 MCG/ACT inhaler Inhale 2 puffs into the lungs 2 (two) times daily. (Patient taking differently: Inhale 2 puffs into the lungs at bedtime. ) 1 Inhaler 6  . busPIRone (BUSPAR) 7.5 MG tablet Take 1 tablet (7.5 mg total) by mouth 2 (two) times daily. 180 tablet 0  . cetirizine (ZYRTEC) 10 MG tablet Take 10 mg by mouth at bedtime.     . diazepam (VALIUM) 5 MG tablet TAKE 1 TABLET BY MOUTH TWICE DAILY 60 tablet 2  . diphenhydrAMINE (BENADRYL) 25 MG tablet Take 25 mg by mouth every 6 (six) hours as needed for itching or allergies.    Marland Kitchen escitalopram (LEXAPRO) 20 MG tablet Take 1 tablet (20 mg total) by mouth daily. 90 tablet 0  . fenofibrate (TRICOR) 145 MG  tablet TAKE 1 TABLET (145 MG TOTAL) BY MOUTH DAILY. 30 tablet 5  . ibuprofen (ADVIL,MOTRIN) 200 MG tablet Take 400 mg by mouth every 8 (eight) hours as needed for mild pain or moderate pain. Reported on 05/31/2015    . metoprolol succinate (TOPROL-XL) 25 MG 24 hr tablet TAKE 1 TABLET BY MOUTH DAILY. 30 tablet 5  . montelukast (SINGULAIR) 10 MG tablet TAKE 1 TABLET BY MOUTH AT BEDTIME. 90 tablet 1  . Multiple Vitamin (MULTIVITAMIN WITH MINERALS) TABS tablet Take 1 tablet by mouth daily.    . ondansetron (ZOFRAN-ODT) 4 MG disintegrating tablet Take 1 tablet (4 mg total) by mouth every 8 (eight) hours as needed for nausea or vomiting. 20 tablet 0  . triamterene-hydrochlorothiazide (DYAZIDE) 37.5-25 MG capsule Take 1 each (1 capsule total) by mouth daily.    Marland Kitchen VASCEPA 1 g CAPS TAKE 2 CAPSULES BY MOUTH TWICE A DAY 360 capsule 1  . VENTOLIN HFA 108 (90 Base) MCG/ACT inhaler INHALE 2 PUFFS BY MOUTH EVERY 6 HOURS AS NEEDED FOR WHEEZING 18 g 2   No current facility-administered medications on file prior to visit.     Allergies  Allergen Reactions  . Belsomra [Suvorexant] Other (See Comments)    nightmares  . Percocet [Oxycodone-Acetaminophen] Nausea And Vomiting    Family History  Problem Relation Age of Onset  . Hyperlipidemia Father     Living  . Stroke Father   . Hypertension Father   .  Alcohol abuse Mother     Living  . Diabetes Mellitus II Maternal Grandfather   . Hypertension Maternal Grandfather   . Heart failure Maternal Grandfather   . Kidney disease Maternal Grandfather   . Heart disease Maternal Grandmother   . Melanoma Paternal Grandmother   . Heart attack Paternal Grandfather   . Migraines Brother   . Healthy Brother     x2  . Drug abuse Sister     Died of Overdose    Social History   Social History  . Marital status: Single    Spouse name: N/A  . Number of children: N/A  . Years of education: N/A   Social History Main Topics  . Smoking status: Never Smoker  .  Smokeless tobacco: Never Used  . Alcohol use 0.0 oz/week     Comment: 2-3 beers nightly  . Drug use: No  . Sexual activity: Yes    Birth control/ protection: None     Comment: male - 1 partner   Other Topics Concern  . None   Social History Narrative  . None   Review of Systems - See HPI.  All other ROS are negative.  BP 118/80   Pulse 95   Temp 98.2 F (36.8 C) (Oral)   Resp 16   Ht 5\' 10"  (1.778 m)   Wt 210 lb (95.3 kg)   SpO2 97%   BMI 30.13 kg/m   Physical Exam  Constitutional: He is oriented to person, place, and time and well-developed, well-nourished, and in no distress.  HENT:  Head: Normocephalic and atraumatic.  Eyes: Conjunctivae are normal.  Neck: Neck supple.  Cardiovascular: Normal rate, regular rhythm, normal heart sounds and intact distal pulses.   Pulmonary/Chest: Effort normal.  Neurological: He is alert and oriented to person, place, and time. No cranial nerve deficit.  Skin: Skin is warm and dry. No rash noted.  Psychiatric: Affect normal.  Vitals reviewed.  Assessment/Plan: 1. Meniere's disease, unspecified laterality Doing well overall. Continue current regimen. FU with ENT as scheduled. Forms completed and faxed.   2. Depression, unspecified depression type Will attempt wean to 10 mg Lexapro to help with sexual side effects. Will monitor closely. If not improving or if there is any worsening mood, we would increase back to 20 mg and attempt to add on Wellbutrin to mitigate sexual side effects.    Leeanne Rio, PA-C

## 2015-12-12 NOTE — Progress Notes (Signed)
Pre visit review using our clinic review tool, if applicable. No additional management support is needed unless otherwise documented below in the visit note. 

## 2015-12-28 MED FILL — diazePAM 5 MG TABS: 5 | 30 days supply | Qty: 60 | Fill #0

## 2015-12-28 MED FILL — METOPROLOL SUCC ER 25 MG TA: 25 | 30 days supply | Qty: 30 | Fill #3

## 2016-01-01 ENCOUNTER — Other Ambulatory Visit: Payer: Self-pay | Admitting: Physician Assistant

## 2016-01-02 MED FILL — ATORVASTATIN 20 MG TABLET: 20 | 30 days supply | Qty: 30 | Fill #0

## 2016-01-02 NOTE — Telephone Encounter (Signed)
Med filled #30 with 0, pt due for cholesterol follow up.

## 2016-01-15 MED FILL — MONTELUKAST SOD 10 MG TAB: 10 | 90 days supply | Qty: 90 | Fill #1

## 2016-01-15 MED FILL — ESCITALOPRAM 10 MG TABLET: 10 | 30 days supply | Qty: 30 | Fill #1

## 2016-01-23 ENCOUNTER — Other Ambulatory Visit: Payer: Self-pay | Admitting: Emergency Medicine

## 2016-01-23 MED ORDER — ALBUTEROL SULFATE HFA 108 (90 BASE) MCG/ACT IN AERS: INHALATION_SPRAY | RESPIRATORY_TRACT | 2 refills | Status: DC

## 2016-02-05 ENCOUNTER — Other Ambulatory Visit: Payer: Self-pay | Admitting: Physician Assistant

## 2016-02-05 MED FILL — ESCITALOPRAM 20 MG TABLET: 20 | 90 days supply | Qty: 90 | Fill #0

## 2016-02-05 MED FILL — diazePAM 5 MG TABS: 5 | 30 days supply | Qty: 60 | Fill #1

## 2016-02-05 MED FILL — METOPROLOL SUCC ER 25 MG TA: 25 | 30 days supply | Qty: 30 | Fill #4

## 2016-02-26 MED FILL — TRIAMTERENE/HCTZ 37.5/25 CP: 37.5-25 | 60 days supply | Qty: 60 | Fill #2

## 2016-03-05 MED FILL — METOPROLOL SUCC ER 25 MG TA: 25 | 30 days supply | Qty: 30 | Fill #5

## 2016-03-06 ENCOUNTER — Encounter: Payer: Self-pay | Admitting: Physician Assistant

## 2016-03-08 ENCOUNTER — Other Ambulatory Visit: Payer: Self-pay | Admitting: Physician Assistant

## 2016-03-08 ENCOUNTER — Encounter: Payer: Self-pay | Admitting: Physician Assistant

## 2016-03-11 ENCOUNTER — Encounter: Payer: Self-pay | Admitting: Physician Assistant

## 2016-03-11 ENCOUNTER — Ambulatory Visit (INDEPENDENT_AMBULATORY_CARE_PROVIDER_SITE_OTHER): Payer: 59 | Admitting: Physician Assistant

## 2016-03-11 VITALS — BP 132/90 | HR 108 | Temp 98.2°F | Resp 16 | Ht 70.0 in | Wt 217.0 lb

## 2016-03-11 DIAGNOSIS — H8109 Meniere's disease, unspecified ear: Secondary | ICD-10-CM | POA: Diagnosis not present

## 2016-03-11 MED ORDER — PROMETHAZINE HCL 25 MG/ML IJ SOLN: 25.0000 mg | Freq: Once | INTRAMUSCULAR | Status: DC

## 2016-03-11 MED ORDER — ONDANSETRON HCL 4 MG/2ML IJ SOLN
4.0000 mg | Freq: Once | INTRAMUSCULAR | Status: AC
  Administered 2016-03-11: 4 mg via INTRAMUSCULAR

## 2016-03-11 MED ORDER — DIAZEPAM 5 MG PO TABS: 5.0000 mg | ORAL_TABLET | Freq: Two times a day (BID) | ORAL | 2 refills | Status: DC

## 2016-03-11 MED FILL — diazePAM 5 MG TABS: 5 | 30 days supply | Qty: 60 | Fill #0

## 2016-03-11 NOTE — Patient Instructions (Signed)
I am glad the zofran helped. Your BP at heart rate are much improved and symptoms are resolving. Restart low-sodium diet. Make sure to take chronic medications, including the Valium as directed.  If symptoms recur, please go to the ER for assessment.

## 2016-03-11 NOTE — Progress Notes (Signed)
Patient with history of Meniere's disease  presents to clinic today c/o flare up of vertigo over the past few days. Denies cold or URI symptoms preceding flare. Is taking his diuretic as directed. Has run out of Valium which he typically takes once daily due to Meniere's. Has been out for 1 week. Patient does note significant increase in dietary salt intake over the past 1-2 weeks. Denies chest pain, palpitations, lightheadedness.  Has not hydrated well today.   Past Medical History:  Diagnosis Date  . Asthma   . Chicken pox   . Depression    Resolved  . Environmental and seasonal allergies   . Essential hypertension, benign 08/03/2013  . Hyperlipidemia   . Meniere's disease   . Thoracic outlet syndrome     Current Outpatient Prescriptions on File Prior to Visit  Medication Sig Dispense Refill  . albuterol (VENTOLIN HFA) 108 (90 Base) MCG/ACT inhaler INHALE 2 PUFFS BY MOUTH EVERY 6 HOURS AS NEEDED FOR WHEEZING 18 g 2  . atorvastatin (LIPITOR) 20 MG tablet TAKE 1 TABLET BY MOUTH ONCE DAILY AT 6 PM. (PLEASE CALL [phone] TO SCHEDULE FOLLOW UP) 30 tablet 0  . budesonide-formoterol (SYMBICORT) 80-4.5 MCG/ACT inhaler Inhale 2 puffs into the lungs 2 (two) times daily. (Patient taking differently: Inhale 2 puffs into the lungs at bedtime. ) 1 Inhaler 6  . busPIRone (BUSPAR) 7.5 MG tablet Take 1 tablet (7.5 mg total) by mouth 2 (two) times daily. 180 tablet 0  . cetirizine (ZYRTEC) 10 MG tablet Take 10 mg by mouth at bedtime.     . diphenhydrAMINE (BENADRYL) 25 MG tablet Take 25 mg by mouth every 6 (six) hours as needed for itching or allergies.    Marland Kitchen escitalopram (LEXAPRO) 10 MG tablet Take 1 tablet (10 mg total) by mouth daily. 30 tablet 1  . fenofibrate (TRICOR) 145 MG tablet TAKE 1 TABLET (145 MG TOTAL) BY MOUTH DAILY. 30 tablet 5  . ibuprofen (ADVIL,MOTRIN) 200 MG tablet Take 400 mg by mouth every 8 (eight) hours as needed for mild pain or moderate pain. Reported on 05/31/2015    . metoprolol  succinate (TOPROL-XL) 25 MG 24 hr tablet TAKE 1 TABLET BY MOUTH DAILY. 30 tablet 5  . montelukast (SINGULAIR) 10 MG tablet TAKE 1 TABLET BY MOUTH AT BEDTIME. 90 tablet 1  . Multiple Vitamin (MULTIVITAMIN WITH MINERALS) TABS tablet Take 1 tablet by mouth daily.    . ondansetron (ZOFRAN-ODT) 4 MG disintegrating tablet Take 1 tablet (4 mg total) by mouth every 8 (eight) hours as needed for nausea or vomiting. 20 tablet 0  . triamterene-hydrochlorothiazide (DYAZIDE) 37.5-25 MG capsule Take 1 each (1 capsule total) by mouth daily.    Marland Kitchen VASCEPA 1 g CAPS TAKE 2 CAPSULES BY MOUTH TWICE A DAY 360 capsule 1   No current facility-administered medications on file prior to visit.     Allergies  Allergen Reactions  . Belsomra [Suvorexant] Other (See Comments)    nightmares  . Percocet [Oxycodone-Acetaminophen] Nausea And Vomiting    Family History  Problem Relation Age of Onset  . Hyperlipidemia Father     Living  . Stroke Father   . Hypertension Father   . Alcohol abuse Mother     Living  . Diabetes Mellitus II Maternal Grandfather   . Hypertension Maternal Grandfather   . Heart failure Maternal Grandfather   . Kidney disease Maternal Grandfather   . Heart disease Maternal Grandmother   . Melanoma Paternal Grandmother   .  Heart attack Paternal Grandfather   . Migraines Brother   . Healthy Brother     x2  . Drug abuse Sister     Died of Overdose    Social History   Social History  . Marital status: Single    Spouse name: N/A  . Number of children: N/A  . Years of education: N/A   Social History Main Topics  . Smoking status: Never Smoker  . Smokeless tobacco: Never Used  . Alcohol use 0.0 oz/week     Comment: 2-3 beers nightly  . Drug use: No  . Sexual activity: Yes    Birth control/ protection: None     Comment: male - 1 partner   Other Topics Concern  . None   Social History Narrative  . None   Review of Systems - See HPI.  All other ROS are negative.  BP 132/90    Pulse (!) 108   Temp 98.2 F (36.8 C) (Oral)   Resp 16   Ht 5\' 10"  (1.778 m)   Wt 217 lb (98.4 kg)   SpO2 99%   BMI 31.14 kg/m   Physical Exam  Constitutional: He is oriented to person, place, and time and well-developed, well-nourished, and in no distress.  HENT:  Head: Normocephalic and atraumatic.  Right Ear: External ear normal.  Left Ear: External ear normal.  Nose: Nose normal.  Mouth/Throat: Oropharynx is clear and moist. No oropharyngeal exudate.  Eyes: Conjunctivae are normal.  Neck: Neck supple.  Cardiovascular: Regular rhythm, normal heart sounds and intact distal pulses.   Mild tachycardia  Pulmonary/Chest: Effort normal and breath sounds normal. No respiratory distress. He has no wheezes. He has no rales. He exhibits no tenderness.  Neurological: He is alert and oriented to person, place, and time.  Skin: Skin is warm and dry. No rash noted.  Psychiatric: Affect normal.  Vitals reviewed.  Assessment/Plan: 1. Meniere's disease, unspecified laterality Typical flare for patient. Secondary to significant salt increase and lack of medications. IM zofran given with improvement in symptoms. (Phenergan order placed incorrectly by CMA -- not given. Correct order for Zofran then placed and shot given) Restart valium and diuretic. Low salt diet. FU if not improving within 24 hours. ER if anything worsens. No driving until symptoms resolve.   - ondansetron (ZOFRAN) injection 4 mg; Inject 2 mLs (4 mg total) into the muscle once.   Leeanne Rio, PA-C

## 2016-03-11 NOTE — Progress Notes (Signed)
Pre visit review using our clinic review tool, if applicable. No additional management support is needed unless otherwise documented below in the visit note. 

## 2016-03-18 MED FILL — FENOFIBRATE 145 MG TABLET: 145 | 30 days supply | Qty: 30 | Fill #3

## 2016-03-29 ENCOUNTER — Emergency Department (HOSPITAL_COMMUNITY): Payer: 59

## 2016-03-29 ENCOUNTER — Encounter (HOSPITAL_COMMUNITY): Payer: Self-pay

## 2016-03-29 ENCOUNTER — Emergency Department (HOSPITAL_COMMUNITY)
Admission: EM | Admit: 2016-03-29 | Discharge: 2016-03-29 | Disposition: A | Discharge status: Home / Self Care | Payer: 59 | Attending: Emergency Medicine | Admitting: Emergency Medicine

## 2016-03-29 DIAGNOSIS — S42201A Unspecified fracture of upper end of right humerus, initial encounter for closed fracture: Secondary | ICD-10-CM | POA: Diagnosis not present

## 2016-03-29 DIAGNOSIS — T07XXXA Unspecified multiple injuries, initial encounter: Secondary | ICD-10-CM

## 2016-03-29 DIAGNOSIS — Y999 Unspecified external cause status: Secondary | ICD-10-CM | POA: Insufficient documentation

## 2016-03-29 DIAGNOSIS — M79602 Pain in left arm: Secondary | ICD-10-CM | POA: Diagnosis not present

## 2016-03-29 DIAGNOSIS — W108XXA Fall (on) (from) other stairs and steps, initial encounter: Secondary | ICD-10-CM | POA: Insufficient documentation

## 2016-03-29 DIAGNOSIS — W19XXXA Unspecified fall, initial encounter: Secondary | ICD-10-CM

## 2016-03-29 DIAGNOSIS — J45909 Unspecified asthma, uncomplicated: Secondary | ICD-10-CM | POA: Diagnosis not present

## 2016-03-29 DIAGNOSIS — Y92009 Unspecified place in unspecified non-institutional (private) residence as the place of occurrence of the external cause: Secondary | ICD-10-CM | POA: Insufficient documentation

## 2016-03-29 DIAGNOSIS — S0181XA Laceration without foreign body of other part of head, initial encounter: Secondary | ICD-10-CM | POA: Insufficient documentation

## 2016-03-29 DIAGNOSIS — Y9389 Activity, other specified: Secondary | ICD-10-CM | POA: Insufficient documentation

## 2016-03-29 DIAGNOSIS — S00531A Contusion of lip, initial encounter: Secondary | ICD-10-CM | POA: Diagnosis not present

## 2016-03-29 DIAGNOSIS — I10 Essential (primary) hypertension: Secondary | ICD-10-CM | POA: Insufficient documentation

## 2016-03-29 DIAGNOSIS — S40212A Abrasion of left shoulder, initial encounter: Secondary | ICD-10-CM | POA: Insufficient documentation

## 2016-03-29 MED ORDER — LIDOCAINE HCL (PF) 1 % IJ SOLN
5.0000 mL | Freq: Once | INTRAMUSCULAR | Status: AC
  Administered 2016-03-29: 5 mL

## 2016-03-29 MED ORDER — LIDOCAINE HCL (PF) 1 % IJ SOLN
INTRAMUSCULAR | Status: AC
Start: 2016-03-29 — End: 2016-03-29
  Administered 2016-03-29: 5 mL
  Filled 2016-03-29: qty 5

## 2016-03-29 NOTE — ED Triage Notes (Signed)
Pt reports losing balance and falling down 4 concrete stairs at house. Pt reports positive LOC. Friends standing by reports pt was unresponsive for <1 min. Pt reports have 2 alcoholic beverages tonight. Abrasions present to LEFT side of face, LEFT shoulder, LEFT knee, and lac to scalp.

## 2016-04-04 NOTE — ED Provider Notes (Signed)
[facility] DEPT Provider Note   CSN: [id number] Arrival date & time: 03/29/16  0320    History   Chief Complaint Chief Complaint  Patient presents with  . Fall  . Loss of Consciousness    HPI Steve Jacobs is a 31 y.o. male.  Patient is a 31 year old male with a history of Mnire's disease who presents to the emergency department after a fall. Patient states that he lost his balance causing him to fall down 4 concrete stairs at his home. He had no loss of consciousness. He sustained multiple abrasions to his face as well as a laceration to his left temple. Patient complaining of left shoulder pain, left forearm pain, as well as left knee pain. He denies taking any medications for symptoms prior to arrival. He has applied ice to his pain. This was given to him in triage. He denies any nausea or vomiting since the fall. No unilateral numbness, paresthesias, or weakness. He cannot recall the date his last tetanus shot, but declines updated shot in the ED stating that he will check on the status of this with his primary doctor.     Past Medical History:  Diagnosis Date  . Asthma   . Chicken pox   . Depression    Resolved  . Environmental and seasonal allergies   . Essential hypertension, benign 08/03/2013  . Hyperlipidemia   . Meniere's disease   . Thoracic outlet syndrome     Patient Active Problem List   Diagnosis Date Noted  . SVT (supraventricular tachycardia) ([place]) 10/08/2014  . Depression 10/08/2014  . Atypical nevi 06/22/2014  . Left ankle sprain 12/23/2013  . Elevated triglycerides with high cholesterol 09/06/2013  . Anxiety state 08/03/2013  . Essential hypertension, benign 08/03/2013  . Environmental allergies 03/23/2013  . Visit for preventive health examination 03/23/2013  . Meniere disease 03/23/2013  . Palpitations 03/23/2013  . Intrinsic asthma   . SOB (shortness of breath)     Past Surgical History:  Procedure Laterality Date  . Nevus Biopsy           Home Medications    Prior to Admission medications   Medication Sig Start Date End Date Taking? Authorizing Provider  albuterol (VENTOLIN HFA) 108 (90 Base) MCG/ACT inhaler INHALE 2 PUFFS BY MOUTH EVERY 6 HOURS AS NEEDED FOR WHEEZING 01/23/16   Brunetta Jeans, PA-C  atorvastatin (LIPITOR) 20 MG tablet TAKE 1 TABLET BY MOUTH ONCE DAILY AT 6 PM. (PLEASE CALL [phone] TO SCHEDULE FOLLOW UP) 02/07/16   Brunetta Jeans, PA-C  budesonide-formoterol [facility]) 80-4.5 MCG/ACT inhaler Inhale 2 puffs into the lungs 2 (two) times daily. Patient taking differently: Inhale 2 puffs into the lungs at bedtime.  06/22/14   Brunetta Jeans, PA-C  busPIRone (BUSPAR) 7.5 MG tablet Take 1 tablet (7.5 mg total) by mouth 2 (two) times daily. 12/06/15   Brunetta Jeans, PA-C  cetirizine (ZYRTEC) 10 MG tablet Take 10 mg by mouth at bedtime.     Historical Provider, MD  diazepam (VALIUM) 5 MG tablet Take 1 tablet (5 mg total) by mouth 2 (two) times daily. 03/11/16   Brunetta Jeans, PA-C  diphenhydrAMINE (BENADRYL) 25 MG tablet Take 25 mg by mouth every 6 (six) hours as needed for itching or allergies.    Historical Provider, MD  escitalopram (LEXAPRO) 10 MG tablet Take 1 tablet (10 mg total) by mouth daily. 12/12/15   Brunetta Jeans, PA-C  fenofibrate (TRICOR) 145 MG tablet TAKE 1 TABLET (  145 MG TOTAL) BY MOUTH DAILY. 03/22/15   Brunetta Jeans, PA-C  ibuprofen (ADVIL,MOTRIN) 200 MG tablet Take 400 mg by mouth every 8 (eight) hours as needed for mild pain or moderate pain. Reported on 05/31/2015    Historical Provider, MD  metoprolol succinate (TOPROL-XL) 25 MG 24 hr tablet TAKE 1 TABLET BY MOUTH DAILY. 08/09/15   Brunetta Jeans, PA-C  montelukast (SINGULAIR) 10 MG tablet TAKE 1 TABLET BY MOUTH AT BEDTIME. 09/22/15   Brunetta Jeans, PA-C  Multiple Vitamin (MULTIVITAMIN WITH MINERALS) TABS tablet Take 1 tablet by mouth daily.    Historical Provider, MD  ondansetron (ZOFRAN-ODT) 4 MG disintegrating tablet Take  1 tablet (4 mg total) by mouth every 8 (eight) hours as needed for nausea or vomiting. 12/01/15   Brunetta Jeans, PA-C  triamterene-hydrochlorothiazide (DYAZIDE) 37.5-25 MG capsule Take 1 each (1 capsule total) by mouth daily. 05/31/15   Brunetta Jeans, PA-C  VASCEPA 1 g CAPS TAKE 2 CAPSULES BY MOUTH TWICE A DAY 09/22/15   Brunetta Jeans, PA-C    Family History Family History  Problem Relation Age of Onset  . Hyperlipidemia Father     Living  . Stroke Father   . Hypertension Father   . Alcohol abuse Mother     Living  . Diabetes Mellitus II Maternal Grandfather   . Hypertension Maternal Grandfather   . Heart failure Maternal Grandfather   . Kidney disease Maternal Grandfather   . Heart disease Maternal Grandmother   . Melanoma Paternal Grandmother   . Heart attack Paternal Grandfather   . Migraines Brother   . Healthy Brother     x2  . Drug abuse Sister     Died of Overdose    Social History Social History  Substance Use Topics  . Smoking status: Never Smoker  . Smokeless tobacco: Never Used  . Alcohol use 0.0 oz/week     Comment: 2-3 beers nightly     Allergies   Belsomra [suvorexant] and Percocet [oxycodone-acetaminophen]   Review of Systems Review of Systems Ten systems reviewed and are negative for acute change, except as noted in the HPI.    Physical Exam Updated Vital Signs BP (!) 141/78   Pulse 86   Temp 97.8 F (36.6 C) (Oral)   Resp 16   Ht 5\' 10"  (1.778 m)   Wt 93 kg   SpO2 100%   BMI 29.41 kg/m   Physical Exam  Constitutional: He is oriented to person, place, and time. He appears well-developed and well-nourished. No distress.  Nontoxic and in NAD  HENT:  Head: Normocephalic.    Mouth/Throat:    No hemotympanum bilaterally. No battle's sign or raccoon's eyes.  Eyes: Conjunctivae and EOM are normal. Pupils are equal, round, and reactive to light. No scleral icterus.  Neck: Normal range of motion.  No nuchal rigidity or meningismus.  No tenderness to palpation of the cervical midline. No bony deformities, step-offs, or crepitus.  Cardiovascular: Normal rate, regular rhythm and intact distal pulses.   Pulmonary/Chest: Effort normal. No respiratory distress. He has no wheezes. He has no rales.  Respirations even and unlabored.  Musculoskeletal: Normal range of motion.  Neurological: He is alert and oriented to person, place, and time. He displays normal reflexes. No cranial nerve deficit. He exhibits normal muscle tone.  GCS 15. Speech is goal oriented. No cranial nerve deficits appreciated; symmetric eyebrow raise, no facial drooping, tongue midline. Patient has equal grip strength bilaterally with 5/5  strength against resistance in all major muscle groups bilaterally. Sensation to light touch intact. Patient moves extremities without ataxia. Patient ambulatory with steady gait.   Skin: Skin is warm and dry. No rash noted. He is not diaphoretic. No erythema. No pallor.     Multiple abrasions to face and to L shoulder  Psychiatric: He has a normal mood and affect. His behavior is normal.  Nursing note and vitals reviewed.    ED Treatments / Results  Labs (all labs ordered are listed, but only abnormal results are displayed) Labs Reviewed - No data to display  EKG  EKG Interpretation None       Radiology No results found.  Procedures Procedures (including critical care time)  Medications Ordered in ED Medications  lidocaine (PF) (XYLOCAINE) 1 % injection 5 mL (5 mLs Infiltration Given 03/29/16 0530)    LACERATION REPAIR Performed by: Antonietta Breach Authorized by: Antonietta Breach Consent: Verbal consent obtained. Risks and benefits: risks, benefits and alternatives were discussed Consent given by: patient Patient identity confirmed: provided demographic data Prepped and Draped in normal sterile fashion Wound explored  Laceration Location: L temple  Laceration Length: 1.5cm  No Foreign Bodies seen or  palpated  Anesthesia: local infiltration  Local anesthetic: lidocaine 2% without epinephrine  Anesthetic total: 2 ml  Irrigation method: syringe Amount of cleaning: standard  Skin closure: 4-0 vicryl  Number of sutures: 2  Technique: simple interrupted  Patient tolerance: Patient tolerated the procedure well with no immediate complications.   Initial Impression / Assessment and Plan / ED Course  I have reviewed the triage vital signs and the nursing notes.  Pertinent labs & imaging results that were available during my care of the patient were reviewed by me and considered in my medical decision making (see chart for details).     31 year old male presents to the emergency department after a fall at home. He reports having balance issues at baseline secondary to Mnire's disease. He sustained his injuries tonight after losing his balance and falling down 4 concrete steps. No loss of consciousness. No nausea or vomiting after the fall. Patient further to denies unilateral numbness, paresthesias, and weakness. No battle's sign or raccoon's eyes. Neurologic exam nonfocal.  He expresses most concerned about his facial laceration and repair of this. Skin flap tacked with Vicryl sutures. Laceration occurred < 8 hours prior to repair which was well tolerated. Pt has no comorbidities to effect normal wound healing. Discussed suture home care w pt and answered questions. Pt to follow up for wound check PRN. Imaging reviewed which is reassuring. Patient comfortable with discharge; no unaddressed concerns.   Final Clinical Impressions(s) / ED Diagnoses   Final diagnoses:  Fall, initial encounter  Facial laceration, initial encounter  Multiple abrasions  Contusion of lip, initial encounter    New Prescriptions Discharge Medication List as of 03/29/2016  5:43 AM       Antonietta Breach, PA-C 04/04/16 2158    [place], DO 04/06/16 1049

## 2016-04-08 ENCOUNTER — Telehealth: Payer: 59 | Admitting: Adult Health

## 2016-04-08 ENCOUNTER — Other Ambulatory Visit: Payer: Self-pay | Admitting: Physician Assistant

## 2016-04-08 DIAGNOSIS — B001 Herpesviral vesicular dermatitis: Secondary | ICD-10-CM

## 2016-04-08 MED ORDER — VALACYCLOVIR HCL 1 G PO TABS: 2000.0000 mg | ORAL_TABLET | Freq: Two times a day (BID) | ORAL | 0 refills | Status: AC

## 2016-04-08 MED FILL — ATORVASTATIN 20 MG TABLET: 20 | 30 days supply | Qty: 30 | Fill #0

## 2016-04-08 MED FILL — diazePAM 5 MG TABS: 5 | 30 days supply | Qty: 60 | Fill #1

## 2016-04-08 MED FILL — valACYclovir HCL 1 GM TABS: 1 | 1 days supply | Qty: 4 | Fill #0

## 2016-04-08 NOTE — Progress Notes (Signed)
We are sorry that you are not feeling well.  Here is how we plan to help!  Based on what you have shared with me it does look like you have a viral infection.    Most cold sores or fever blisters are Colden fluid filled blisters around the mouth caused by herpes simplex virus.  The most common strain of the virus causing cold sores is herpes simplex virus 1.  It can be spread by skin contact, sharing eating utensils, or even sharing towels.  Cold sores are contagious to other people until dry. (Approximately 5-7 days).  Wash your hands. You can spread the virus to your eyes through handling your contact lenses after touching the lesions.  Most people experience pain at the sight or tingling sensations in their lips that may begin before the ulcers erupt.  Herpes simplex is treatable but not curable.  It may lie dormant for a long time and then reappear due to stress or prolonged sun exposure.  Many patients have success in treating their cold sores with an over the counter topical called Abreva.  You may apply the cream up to 5 times daily (maximum 10 days) until healing occurs.  If you would like to use an oral antiviral medication to speed the healing of your cold sore, I have sent a prescription to your local pharmacy Valacyclovir 2 gm twice daily for 1 day    HOME CARE:   Wash your hands frequently.  Do not pick at or rub the sore.  Don't open the blisters.  Avoid kissing other people during this time.  Avoid sharing drinking glasses, eating utensils, or razors.  Do not handle contact lenses unless you have thoroughly washed your hands with soap and warm water!  Avoid oral sex during this time.  Herpes from sores on your mouth can spread to your partner's genital area.  Avoid contact with anyone who has eczema or a weakened immune system.  Cold sores are often triggered by exposure to intense sunlight, use a lip balm containing a sunscreen (SPF 30 or higher).  GET HELP RIGHT AWAY  IF:   Blisters look infected.  Blisters occur near or in the eye.  Symptoms last longer than 10 days.  Your symptoms become worse.  MAKE SURE YOU:   Understand these instructions.  Will watch your condition.  Will get help right away if you are not doing well or get worse.    Your e-visit answers were reviewed by a board certified advanced clinical practitioner to complete your personal care plan.  Depending upon the condition, your plan could have  Included both over the counter or prescription medications.    Please review your pharmacy choice.  Be sure that the pharmacy you have chosen is open so that you can pick up your prescription now.  If there is a problem you csn message your provider in MyChart to have the prescription routed to another pharmacy.    Your safety is important to us.  If you have drug allergies check our prescription carefully.  For the next 24 hours you can use MyChart to ask questions about today's visit, request a non-urgent call back, or ask for a work or school excuse from your e-visit provider.  You will get an email in the next two days asking about your experience.  I hope that your e-visit has been valuable and will speed your recovery.  

## 2016-04-09 MED FILL — METOPROLOL SUCC ER 25 MG TA: 25 | 30 days supply | Qty: 30 | Fill #0

## 2016-04-09 MED FILL — MONTELUKAST SOD 10 MG TAB: 10 | 90 days supply | Qty: 90 | Fill #0

## 2016-04-25 ENCOUNTER — Other Ambulatory Visit: Payer: Self-pay | Admitting: Physician Assistant

## 2016-05-14 ENCOUNTER — Other Ambulatory Visit: Payer: Self-pay | Admitting: Physician Assistant

## 2016-05-14 MED FILL — VASCEPA 1 GM CAPSULE: 1 | 60 days supply | Qty: 240 | Fill #2

## 2016-05-14 MED FILL — FENOFIBRATE 145 MG TAB: 145 | 30 days supply | Qty: 30 | Fill #0

## 2016-05-14 MED FILL — METOPROLOL SUCC ER 25 MG TA: 25 | 30 days supply | Qty: 30 | Fill #1

## 2016-05-14 MED FILL — VENTOLIN HFA 90 MCG INHALER: 108 (90 BAS | 25 days supply | Qty: 18 | Fill #0

## 2016-05-14 MED FILL — TRIAMTERENE/HCTZ 37.5/25 CP: 37.5-25 | 90 days supply | Qty: 90 | Fill #0

## 2016-05-18 ENCOUNTER — Encounter: Payer: Self-pay | Admitting: Physician Assistant

## 2016-06-04 MED FILL — diazePAM 5 MG TABS: 5 | 30 days supply | Qty: 60 | Fill #2

## 2016-06-04 MED FILL — ESCITALOPRAM 10 MG TABLET: 10 | 30 days supply | Qty: 30 | Fill #0

## 2016-06-06 NOTE — Progress Notes (Signed)
Patient presents to clinic today for annual exam.  Patient is fasting for labs. Body mass index is 28.41 kg/m. Diet -- Is following a low-carb diet. Sticking to a 2000 calorie/day diet. Is trying to eat organic.  Exercise -- Walking daily. Stays physically active at work.   Acute Concerns: Denies acute concerns today.   Chronic Issues: Patient with history of depression/anixety and Meniere's disease (followed by ENT). Is well controlled with current medications. Endorses taking medications as directed without side effect. Denies breakthrough symptoms.   Health Maintenance: Immunizations -- up-to-date.  Past Medical History:  Diagnosis Date  . Asthma   . Chicken pox   . Depression    Resolved  . Environmental and seasonal allergies   . Essential hypertension, benign 08/03/2013  . Hyperlipidemia   . Meniere's disease   . Thoracic outlet syndrome     Past Surgical History:  Procedure Laterality Date  . Nevus Biopsy      Current Outpatient Prescriptions on File Prior to Visit  Medication Sig Dispense Refill  . albuterol (VENTOLIN HFA) 108 (90 Base) MCG/ACT inhaler INHALE 2 PUFFS BY MOUTH EVERY 6 HOURS AS NEEDED FOR WHEEZING 18 g 2  . atorvastatin (LIPITOR) 20 MG tablet TAKE 1 TABLET BY MOUTH ONCE DAILY AT 6 PM. (PLEASE CALL [phone] TO SCHEDULE FOLLOW UP) 30 tablet 0  . budesonide-formoterol (SYMBICORT) 80-4.5 MCG/ACT inhaler Inhale 2 puffs into the lungs 2 (two) times daily. (Patient taking differently: Inhale 2 puffs into the lungs at bedtime. ) 1 Inhaler 6  . busPIRone (BUSPAR) 7.5 MG tablet Take 1 tablet (7.5 mg total) by mouth 2 (two) times daily. 180 tablet 0  . cetirizine (ZYRTEC) 10 MG tablet Take 10 mg by mouth at bedtime.     . diazepam (VALIUM) 5 MG tablet Take 1 tablet (5 mg total) by mouth 2 (two) times daily. 60 tablet 2  . diphenhydrAMINE (BENADRYL) 25 MG tablet Take 25 mg by mouth every 6 (six) hours as needed for itching or allergies.    Marland Kitchen escitalopram  (LEXAPRO) 10 MG tablet TAKE 1 TABLET BY MOUTH ONCE DAILY 30 tablet 5  . fenofibrate (TRICOR) 145 MG tablet TAKE 1 TABLET BY MOUTH DAILY. 30 tablet 1  . ibuprofen (ADVIL,MOTRIN) 200 MG tablet Take 400 mg by mouth every 8 (eight) hours as needed for mild pain or moderate pain. Reported on 05/31/2015    . metoprolol succinate (TOPROL-XL) 25 MG 24 hr tablet TAKE 1 TABLET BY MOUTH DAILY. 30 tablet 5  . montelukast (SINGULAIR) 10 MG tablet TAKE 1 TABLET BY MOUTH AT BEDTIME. 90 tablet 1  . Multiple Vitamin (MULTIVITAMIN WITH MINERALS) TABS tablet Take 1 tablet by mouth daily.    . ondansetron (ZOFRAN-ODT) 4 MG disintegrating tablet Take 1 tablet (4 mg total) by mouth every 8 (eight) hours as needed for nausea or vomiting. 20 tablet 0  . triamterene-hydrochlorothiazide (DYAZIDE) 37.5-25 MG capsule Take 1 each (1 capsule total) by mouth daily.    Marland Kitchen VASCEPA 1 g CAPS TAKE 2 CAPSULES BY MOUTH TWICE A DAY 360 capsule 1   Current Facility-Administered Medications on File Prior to Visit  Medication Dose Route Frequency Provider Last Rate Last Dose  . promethazine (PHENERGAN) injection 25 mg  25 mg Intramuscular Once Brunetta Jeans, PA-C        Allergies  Allergen Reactions  . Belsomra [Suvorexant] Other (See Comments)    nightmares  . Percocet [Oxycodone-Acetaminophen] Nausea And Vomiting    Family History  Problem Relation Age of Onset  . Hyperlipidemia Father        Living  . Stroke Father   . Hypertension Father   . Alcohol abuse Mother        Living  . Diabetes Mellitus II Maternal Grandfather   . Hypertension Maternal Grandfather   . Heart failure Maternal Grandfather   . Kidney disease Maternal Grandfather   . Heart disease Maternal Grandmother   . Melanoma Paternal Grandmother   . Heart attack Paternal Grandfather   . Migraines Brother   . Healthy Brother        x2  . Drug abuse Sister        Died of Overdose    Social History   Social History  . Marital status: Single     Spouse name: N/A  . Number of children: N/A  . Years of education: N/A   Occupational History  . Not on file.   Social History Main Topics  . Smoking status: Never Smoker  . Smokeless tobacco: Never Used  . Alcohol use 0.0 oz/week     Comment: 2-3 beers nightly  . Drug use: No  . Sexual activity: Yes    Birth control/ protection: None     Comment: male - 1 partner   Other Topics Concern  . Not on file   Social History Narrative  . No narrative on file   Review of Systems  Constitutional: Negative for fever and weight loss.  HENT: Negative for ear discharge, ear pain, hearing loss and tinnitus.   Eyes: Negative for blurred vision, double vision, photophobia and pain.  Respiratory: Negative for cough and shortness of breath.   Cardiovascular: Negative for chest pain and palpitations.  Gastrointestinal: Negative for abdominal pain, blood in stool, constipation, diarrhea, heartburn, melena, nausea and vomiting.  Genitourinary: Negative for dysuria, flank pain, frequency, hematuria and urgency.  Musculoskeletal: Negative for falls.  Neurological: Negative for dizziness, loss of consciousness and headaches.  Endo/Heme/Allergies: Negative for environmental allergies.  Psychiatric/Behavioral: Negative for depression, hallucinations, substance abuse and suicidal ideas. The patient is not nervous/anxious and does not have insomnia.     BP 114/82   Pulse 85   Temp 98.3 F (36.8 C) (Oral)   Resp 16   Ht 5\' 10"  (1.778 m)   Wt 198 lb (89.8 kg)   SpO2 97%   BMI 28.41 kg/m   Physical Exam  Constitutional: He is oriented to person, place, and time and well-developed, well-nourished, and in no distress.  HENT:  Head: Normocephalic and atraumatic.  Right Ear: External ear normal.  Left Ear: External ear normal.  Nose: Nose normal.  Mouth/Throat: Oropharynx is clear and moist. No oropharyngeal exudate.  Eyes: Conjunctivae and EOM are normal. Pupils are equal, round, and reactive  to light.  Neck: Neck supple. No thyromegaly present.  Cardiovascular: Normal rate, regular rhythm, normal heart sounds and intact distal pulses.   Pulmonary/Chest: Effort normal and breath sounds normal. No respiratory distress. He has no wheezes. He has no rales. He exhibits no tenderness.  Abdominal: Soft. Bowel sounds are normal. He exhibits no distension and no mass. There is no tenderness. There is no rebound and no guarding.  Genitourinary: Testes/scrotum normal and penis normal. No discharge found.  Lymphadenopathy:    He has no cervical adenopathy.  Neurological: He is alert and oriented to person, place, and time.  Skin: Skin is warm and dry. No rash noted.  Psychiatric: Affect normal.  Vitals reviewed.  Assessment/Plan: Visit for preventive health examination Depression screen negative. Health Maintenance reviewed -- immunizations up-to-date. Preventive schedule discussed and handout given in AVS. Will obtain fasting labs today.   Routine screening for STI (sexually transmitted infection) Asymptomatic. Exam unremarkable. Follows safe-sex practices. Is in MSM population. Requesting routine screen. Orders placed today.  Elevated triglycerides with high cholesterol Taking medications as directed. Repeat fasting labs today. Working on diet and exercise regimen.   Depression Doing very well. Continue current regimen.     Leeanne Rio, PA-C

## 2016-06-07 ENCOUNTER — Ambulatory Visit (INDEPENDENT_AMBULATORY_CARE_PROVIDER_SITE_OTHER): Payer: 59 | Admitting: Physician Assistant

## 2016-06-07 ENCOUNTER — Encounter: Payer: Self-pay | Admitting: Physician Assistant

## 2016-06-07 ENCOUNTER — Other Ambulatory Visit (HOSPITAL_COMMUNITY)
Admission: RE | Admit: 2016-06-07 | Discharge: 2016-06-07 | Disposition: A | Discharge status: Home / Self Care | Payer: 59 | Source: Ambulatory Visit | Attending: Physician Assistant | Admitting: Physician Assistant

## 2016-06-07 VITALS — BP 114/82 | HR 85 | Temp 98.3°F | Resp 16 | Ht 70.0 in | Wt 198.0 lb

## 2016-06-07 DIAGNOSIS — E782 Mixed hyperlipidemia: Secondary | ICD-10-CM | POA: Diagnosis not present

## 2016-06-07 DIAGNOSIS — Z113 Encounter for screening for infections with a predominantly sexual mode of transmission: Secondary | ICD-10-CM | POA: Insufficient documentation

## 2016-06-07 DIAGNOSIS — F329 Major depressive disorder, single episode, unspecified: Secondary | ICD-10-CM

## 2016-06-07 DIAGNOSIS — Z0000 Encounter for general adult medical examination without abnormal findings: Secondary | ICD-10-CM

## 2016-06-07 DIAGNOSIS — F32A Depression, unspecified: Secondary | ICD-10-CM

## 2016-06-07 LAB — CBC
HCT: 44.9 % (ref 39.0–52.0)
Hemoglobin: 15.4 g/dL (ref 13.0–17.0)
MCHC: 34.3 g/dL (ref 30.0–36.0)
MCV: 82.1 fl (ref 78.0–100.0)
Platelets: 248 10*3/uL (ref 150.0–400.0)
RBC: 5.48 Mil/uL (ref 4.22–5.81)
RDW: 13.3 % (ref 11.5–15.5)
WBC: 2.7 10*3/uL — AB (ref 4.0–10.5)

## 2016-06-07 LAB — LIPID PANEL
Cholesterol: 169 mg/dL (ref 0–200)
HDL: 39.1 mg/dL (ref >39.00)
NonHDL: 129.94
Total CHOL/HDL Ratio: 4
Triglycerides: 263 mg/dL — ABNORMAL HIGH (ref 0.0–149.0)
VLDL: 52.6 mg/dL — AB (ref 0.0–40.0)

## 2016-06-07 LAB — HEMOGLOBIN A1C: HEMOGLOBIN A1C: 5.2 % (ref 4.6–6.5)

## 2016-06-07 LAB — URINALYSIS, ROUTINE W REFLEX MICROSCOPIC
Bilirubin Urine: NEGATIVE
HGB URINE DIPSTICK: NEGATIVE
Ketones, ur: NEGATIVE
LEUKOCYTES UA: NEGATIVE
Nitrite: NEGATIVE
PH: 8 (ref 5.0–8.0)
RBC / HPF: NONE SEEN (ref 0–?)
Specific Gravity, Urine: 1.01 (ref 1.000–1.030)
TOTAL PROTEIN, URINE-UPE24: NEGATIVE
URINE GLUCOSE: NEGATIVE
Urobilinogen, UA: 0.2 (ref 0.0–1.0)

## 2016-06-07 LAB — COMPREHENSIVE METABOLIC PANEL
ALK PHOS: 43 U/L (ref 39–117)
ALT: 18 U/L (ref 0–53)
AST: 19 U/L (ref 0–37)
Albumin: 5.2 g/dL (ref 3.5–5.2)
BILIRUBIN TOTAL: 0.7 mg/dL (ref 0.2–1.2)
BUN: 9 mg/dL (ref 6–23)
CO2: 32 mEq/L (ref 19–32)
Calcium: 10.2 mg/dL (ref 8.4–10.5)
Chloride: 99 mEq/L (ref 96–112)
Creatinine, Ser: 0.88 mg/dL (ref 0.40–1.50)
GFR: 107.44 mL/min (ref >60.00)
GLUCOSE: 100 mg/dL — AB (ref 70–99)
POTASSIUM: 3.8 meq/L (ref 3.5–5.1)
SODIUM: 139 meq/L (ref 135–145)
TOTAL PROTEIN: 7.5 g/dL (ref 6.0–8.3)

## 2016-06-07 LAB — LDL CHOLESTEROL, DIRECT: Direct LDL: 69 mg/dL

## 2016-06-07 LAB — TSH: TSH: 1.48 u[IU]/mL (ref 0.35–4.50)

## 2016-06-07 NOTE — Assessment & Plan Note (Signed)
Depression screen negative. Health Maintenance reviewed -- immunizations up-to-date. Preventive schedule discussed and handout given in AVS. Will obtain fasting labs today.

## 2016-06-07 NOTE — Assessment & Plan Note (Signed)
Doing very well. Continue current regimen.  

## 2016-06-07 NOTE — Assessment & Plan Note (Signed)
Asymptomatic. Exam unremarkable. Follows safe-sex practices. Is in MSM population. Requesting routine screen. Orders placed today.

## 2016-06-07 NOTE — Assessment & Plan Note (Signed)
Taking medications as directed. Repeat fasting labs today. Working on diet and exercise regimen.

## 2016-06-07 NOTE — Progress Notes (Signed)
Pre visit review using our clinic review tool, if applicable. No additional management support is needed unless otherwise documented below in the visit note. 

## 2016-06-07 NOTE — Patient Instructions (Signed)
Please go to the lab for blood work.   Our office will call you with your results unless you have chosen to receive results via MyChart.  If your blood work is normal we will follow-up each year for physicals and as scheduled for chronic medical problems.  If anything is abnormal we will treat accordingly and get you in for a follow-up.  Please continue chronic medications as directed.   Preventive Care 18-39 Years, Male Preventive care refers to lifestyle choices and visits with your health care provider that can promote health and wellness. What does preventive care include?  A yearly physical exam. This is also called an annual well check.  Dental exams once or twice a year.  Routine eye exams. Ask your health care provider how often you should have your eyes checked.  Personal lifestyle choices, including:  Daily care of your teeth and gums.  Regular physical activity.  Eating a healthy diet.  Avoiding tobacco and drug use.  Limiting alcohol use.  Practicing safe sex. What happens during an annual well check? The services and screenings done by your health care provider during your annual well check will depend on your age, overall health, lifestyle risk factors, and family history of disease. Counseling  Your health care provider may ask you questions about your:  Alcohol use.  Tobacco use.  Drug use.  Emotional well-being.  Home and relationship well-being.  Sexual activity.  Eating habits.  Work and work Statistician. Screening  You may have the following tests or measurements:  Height, weight, and BMI.  Blood pressure.  Lipid and cholesterol levels. These may be checked every 5 years starting at age 44.  Diabetes screening. This is done by checking your blood sugar (glucose) after you have not eaten for a while (fasting).  Skin check.  Hepatitis C blood test.  Hepatitis B blood test.  Sexually transmitted disease (STD) testing. Discuss  your test results, treatment options, and if necessary, the need for more tests with your health care provider. Vaccines  Your health care provider may recommend certain vaccines, such as:  Influenza vaccine. This is recommended every year.  Tetanus, diphtheria, and acellular pertussis (Tdap, Td) vaccine. You may need a Td booster every 10 years.  Varicella vaccine. You may need this if you have not been vaccinated.  HPV vaccine. If you are 31 or younger, you may need three doses over 6 months.  Measles, mumps, and rubella (MMR) vaccine. You may need at least one dose of MMR.You may also need a second dose.  Pneumococcal 13-valent conjugate (PCV13) vaccine. You may need this if you have certain conditions and have not been vaccinated.  Pneumococcal polysaccharide (PPSV23) vaccine. You may need one or two doses if you smoke cigarettes or if you have certain conditions.  Meningococcal vaccine. One dose is recommended if you are age 95-21 years and a first-year college student living in a residence hall, or if you have one of several medical conditions. You may also need additional booster doses.  Hepatitis A vaccine. You may need this if you have certain conditions or if you travel or work in places where you may be exposed to hepatitis A.  Hepatitis B vaccine. You may need this if you have certain conditions or if you travel or work in places where you may be exposed to hepatitis B.  Haemophilus influenzae type b (Hib) vaccine. You may need this if you have certain risk factors. Talk to your health care provider about  which screenings and vaccines you need and how often you need them. This information is not intended to replace advice given to you by your health care provider. Make sure you discuss any questions you have with your health care provider. Document Released: 02/26/2001 Document Revised: 09/20/2015 Document Reviewed: 11/01/2014 Elsevier Interactive Patient Education  2017  [employer].

## 2016-06-08 LAB — RPR

## 2016-06-08 LAB — HIV ANTIBODY (ROUTINE TESTING W REFLEX): HIV 1&2 Ab, 4th Generation: NONREACTIVE

## 2016-06-11 ENCOUNTER — Encounter: Payer: Self-pay | Admitting: Physician Assistant

## 2016-06-11 LAB — URINE CYTOLOGY ANCILLARY ONLY
Chlamydia: NEGATIVE
Neisseria Gonorrhea: NEGATIVE

## 2016-06-17 ENCOUNTER — Other Ambulatory Visit: Payer: Self-pay | Admitting: Physician Assistant

## 2016-06-17 MED FILL — busPIRone HCL 7.5 MG TABS: 7.5 | 90 days supply | Qty: 180 | Fill #0

## 2016-06-17 MED FILL — METOPROLOL SUCC ER 25 MG TA: 25 | 30 days supply | Qty: 30 | Fill #2

## 2016-06-17 MED FILL — ATORVASTATIN 20 MG TABLET: 20 | 90 days supply | Qty: 90 | Fill #0

## 2016-06-17 MED FILL — FENOFIBRATE 145 MG TAB: 145 | 30 days supply | Qty: 30 | Fill #1

## 2016-06-26 ENCOUNTER — Other Ambulatory Visit: Payer: 59

## 2016-07-02 ENCOUNTER — Other Ambulatory Visit: Payer: Self-pay

## 2016-07-04 MED FILL — ESCITALOPRAM 10 MG TABLET: 10 | 30 days supply | Qty: 30 | Fill #1

## 2016-07-08 ENCOUNTER — Other Ambulatory Visit: Payer: Self-pay | Admitting: Physician Assistant

## 2016-07-08 NOTE — Telephone Encounter (Signed)
Diazepam last rx 03/11/16 #60 2RF CSC:11/09/15 UDS: 12/01/15  Please advise

## 2016-07-09 NOTE — Telephone Encounter (Signed)
LMOVM advising patient the rx is ready at front desk for pick up. Will need to give a UDS

## 2016-07-09 NOTE — Telephone Encounter (Signed)
Rx printed. Patient will have to pick up as he is due for UDS.

## 2016-07-10 ENCOUNTER — Encounter: Payer: Self-pay | Admitting: Physician Assistant

## 2016-07-10 ENCOUNTER — Other Ambulatory Visit (INDEPENDENT_AMBULATORY_CARE_PROVIDER_SITE_OTHER): Payer: 59

## 2016-07-10 DIAGNOSIS — Z79899 Other long term (current) drug therapy: Secondary | ICD-10-CM | POA: Diagnosis not present

## 2016-07-10 DIAGNOSIS — D72829 Elevated white blood cell count, unspecified: Secondary | ICD-10-CM

## 2016-07-10 LAB — CBC WITH DIFFERENTIAL/PLATELET
BASOS ABS: 0.1 10*3/uL (ref 0.0–0.1)
Basophils Relative: 1.4 % (ref 0.0–3.0)
EOS ABS: 0.2 10*3/uL (ref 0.0–0.7)
Eosinophils Relative: 3.5 % (ref 0.0–5.0)
HCT: 43.6 % (ref 39.0–52.0)
Hemoglobin: 14.8 g/dL (ref 13.0–17.0)
LYMPHS ABS: 1.6 10*3/uL (ref 0.7–4.0)
Lymphocytes Relative: 24.7 % (ref 12.0–46.0)
MCHC: 34 g/dL (ref 30.0–36.0)
MCV: 82.6 fl (ref 78.0–100.0)
MONO ABS: 0.5 10*3/uL (ref 0.1–1.0)
Monocytes Relative: 8.4 % (ref 3.0–12.0)
NEUTROS ABS: 4 10*3/uL (ref 1.4–7.7)
NEUTROS PCT: 62 % (ref 43.0–77.0)
PLATELETS: 243 10*3/uL (ref 150.0–400.0)
RBC: 5.27 Mil/uL (ref 4.22–5.81)
RDW: 13.9 % (ref 11.5–15.5)
WBC: 6.4 10*3/uL (ref 4.0–10.5)

## 2016-07-10 MED FILL — diazePAM 5 MG TABS: 5 | 30 days supply | Qty: 60 | Fill #0

## 2016-07-10 NOTE — Addendum Note (Signed)
Addended by: Katina Dung on: 07/10/2016 03:22 PM   Modules accepted: Orders

## 2016-07-10 NOTE — Addendum Note (Signed)
Addended by: Katina Dung on: 07/10/2016 03:15 PM   Modules accepted: Orders

## 2016-07-15 ENCOUNTER — Other Ambulatory Visit: Payer: Self-pay | Admitting: Physician Assistant

## 2016-07-15 MED FILL — VASCEPA 1 GM CAPSULE: 1 | 90 days supply | Qty: 360 | Fill #0

## 2016-07-15 MED FILL — METOPROLOL SUCC ER 25 MG TA: 25 | 30 days supply | Qty: 30 | Fill #3

## 2016-07-15 MED FILL — FENOFIBRATE 145 MG TAB: 145 | 90 days supply | Qty: 90 | Fill #0

## 2016-07-15 MED FILL — MONTELUKAST SOD 10 MG TAB: 10 | 90 days supply | Qty: 90 | Fill #1

## 2016-07-27 ENCOUNTER — Encounter: Payer: Self-pay | Admitting: Physician Assistant

## 2016-07-30 ENCOUNTER — Telehealth: Payer: Self-pay | Admitting: Physician Assistant

## 2016-07-30 NOTE — Telephone Encounter (Signed)
Received fax for FMLA forms, placed in bin with charge sheet

## 2016-07-30 NOTE — Telephone Encounter (Signed)
FMLA completed and faxed.  Patient informed via MyChart regarding completion. Will keep copy on file in case anything else is needed. Will also scan into patient's chart.

## 2016-08-02 DIAGNOSIS — H5213 Myopia, bilateral: Secondary | ICD-10-CM | POA: Diagnosis not present

## 2016-08-02 MED FILL — ESCITALOPRAM 10 MG TABLET: 10 | 30 days supply | Qty: 30 | Fill #2

## 2016-08-02 MED FILL — TRIAMTERENE/HCTZ 37.5/25 CP: 37.5-25 | 90 days supply | Qty: 90 | Fill #1

## 2016-08-08 ENCOUNTER — Encounter: Payer: Self-pay | Admitting: Physician Assistant

## 2016-08-08 MED ORDER — LEVOCETIRIZINE DIHYDROCHLORIDE 5 MG PO TABS: 5.0000 mg | ORAL_TABLET | Freq: Every evening | ORAL | 5 refills | Status: DC

## 2016-08-08 MED FILL — LEVOCETIRIZINE 5 MG TABLET: 5 | 30 days supply | Qty: 30 | Fill #0

## 2016-08-12 ENCOUNTER — Other Ambulatory Visit: Payer: Self-pay | Admitting: Physician Assistant

## 2016-08-12 MED FILL — diazePAM 5 MG TABS: 5 | 30 days supply | Qty: 60 | Fill #0

## 2016-08-13 ENCOUNTER — Other Ambulatory Visit: Payer: Self-pay | Admitting: Physician Assistant

## 2016-08-26 ENCOUNTER — Encounter (HOSPITAL_COMMUNITY): Payer: Self-pay | Admitting: *Deleted

## 2016-08-26 ENCOUNTER — Ambulatory Visit (HOSPITAL_COMMUNITY)
Admission: EM | Admit: 2016-08-26 | Discharge: 2016-08-26 | Disposition: A | Discharge status: Home / Self Care | Payer: 59 | Attending: Family Medicine | Admitting: Family Medicine

## 2016-08-26 DIAGNOSIS — J029 Acute pharyngitis, unspecified: Secondary | ICD-10-CM

## 2016-08-26 MED ORDER — LIDOCAINE VISCOUS 2 % MT SOLN: 10.0000 mL | OROMUCOSAL | 0 refills | Status: DC | PRN

## 2016-08-26 MED ORDER — AZITHROMYCIN 250 MG PO TABS: 250.0000 mg | ORAL_TABLET | Freq: Every day | ORAL | 0 refills | Status: DC

## 2016-08-26 MED ORDER — METHYLPREDNISOLONE 4 MG PO TBPK: ORAL_TABLET | ORAL | 0 refills | Status: DC

## 2016-08-26 NOTE — ED Triage Notes (Signed)
Patient reports sore throat and left ear pain.

## 2016-08-26 NOTE — ED Provider Notes (Signed)
  [place]   [id number] 08/26/16 Arrival Time: 9242  ASSESSMENT & PLAN:  1. Acute pharyngitis, unspecified etiology     Meds ordered this encounter  Medications  . azithromycin (ZITHROMAX) 250 MG tablet    Sig: Take 1 tablet (250 mg total) by mouth daily. Take first 2 tablets together, then 1 every day until finished.    Dispense:  6 tablet    Refill:  0    Order Specific Question:   Supervising Provider    Answer:   Vanessa Kick [id number]  . methylPREDNISolone (MEDROL DOSEPAK) 4 MG TBPK tablet    Sig: Take 6-5-4-3-2-1 po qd    Dispense:  21 tablet    Refill:  0    Order Specific Question:   Supervising Provider    AnswerVanessa Kick [[id number]]  . lidocaine (XYLOCAINE) 2 % solution    Sig: Use as directed 10 mLs in the mouth or throat as needed for mouth pain.    Dispense:  100 mL    Refill:  0    Order Specific Question:   Supervising Provider    Answer:   Vanessa Kick [[id number]]    Reviewed expectations re: course of current medical issues. Questions answered. Outlined signs and symptoms indicating need for more acute intervention. Patient verbalized understanding. After Visit Summary given.   SUBJECTIVE:  Steve Jacobs is a 31 y.o. male who presents with complaint of   ROS: As per HPI.   OBJECTIVE:  Vitals:   08/26/16 1801  BP: (!) 149/98  Pulse: (!) 134  Resp: 16  Temp: 98.4 F (36.9 C)  TempSrc: Oral  SpO2: 98%     General appearance: alert; no distress HEENT: normocephalic; atraumatic; conjunctivae normal; TMs normal; nasal mucosa normal; oral mucosa normal tonsils 2 plus erythematous Neck: supple Lungs: clear to auscultation bilaterally Heart: regular rate and rhythm Neurologic: normal symmetric reflexes; normal gait Psychological:  alert and cooperative; normal mood and affect    Labs Reviewed - No data to display  No results found.  Allergies  Allergen Reactions  . Belsomra [Suvorexant] Other (See Comments)   nightmares  . Percocet [Oxycodone-Acetaminophen] Nausea And Vomiting    PMHx, SurgHx, SocialHx, Medications, and Allergies were reviewed in the Visit Navigator and updated as appropriate.      Lysbeth Penner, [place] 08/26/16 [phone]

## 2016-08-29 MED FILL — ESCITALOPRAM 10 MG TABLET: 10 | 30 days supply | Qty: 30 | Fill #3

## 2016-09-04 ENCOUNTER — Encounter: Payer: Self-pay | Admitting: Physician Assistant

## 2016-09-05 ENCOUNTER — Other Ambulatory Visit: Payer: Self-pay | Admitting: Physician Assistant

## 2016-09-05 MED FILL — METOPROLOL SUCC ER 25 MG TA: 25 | 30 days supply | Qty: 30 | Fill #4

## 2016-09-05 MED FILL — LEVOCETIRIZINE 5 MG TABLET: 5 | 30 days supply | Qty: 30 | Fill #1

## 2016-09-05 NOTE — Telephone Encounter (Signed)
Diazepam last rx 08/12/16 #60 CSC: 11/09/15 UDS: 07/10/16 no results in chart  Please advise

## 2016-09-06 NOTE — Telephone Encounter (Signed)
Rx faxed to [facility]

## 2016-09-10 MED FILL — diazePAM 5 MG TABS: 5 | 30 days supply | Qty: 60 | Fill #0

## 2016-09-18 ENCOUNTER — Ambulatory Visit: Payer: Self-pay | Admitting: Physician Assistant

## 2016-09-18 ENCOUNTER — Encounter: Payer: Self-pay | Admitting: Physician Assistant

## 2016-09-18 ENCOUNTER — Ambulatory Visit (INDEPENDENT_AMBULATORY_CARE_PROVIDER_SITE_OTHER): Payer: 59 | Admitting: Physician Assistant

## 2016-09-18 VITALS — BP 120/78 | HR 87 | Temp 98.3°F | Resp 14 | Ht 70.0 in | Wt 201.0 lb

## 2016-09-18 DIAGNOSIS — Z23 Encounter for immunization: Secondary | ICD-10-CM

## 2016-09-18 DIAGNOSIS — L659 Nonscarring hair loss, unspecified: Secondary | ICD-10-CM

## 2016-09-18 MED ORDER — FINASTERIDE 1 MG PO TABS: 1.0000 mg | ORAL_TABLET | Freq: Every day | ORAL | 1 refills | Status: DC

## 2016-09-18 MED FILL — FINASTERIDE 1 MG TABLET: 1 | 30 days supply | Qty: 30 | Fill #0

## 2016-09-18 NOTE — Progress Notes (Signed)
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 Has tried OTc medications including a ketoconazole shampoo, without improvement. Denies other concerns today.  Is due for flu shot. Would like to have today.   Past Medical History:  Diagnosis Date  . Asthma   . Chicken pox   . Depression    Resolved  . Environmental and seasonal allergies   . Essential hypertension, benign 08/03/2013  . Hyperlipidemia   . Meniere's disease   . Thoracic outlet syndrome     Current Outpatient Prescriptions on File Prior to Visit  Medication Sig Dispense Refill  . albuterol (VENTOLIN HFA) 108 (90 Base) MCG/ACT inhaler INHALE 2 PUFFS BY MOUTH EVERY 6 HOURS AS NEEDED FOR WHEEZING 18 g 2  . atorvastatin (LIPITOR) 20 MG tablet TAKE 1 TABLET BY MOUTH DAILY 30 tablet 5  . budesonide-formoterol (SYMBICORT) 80-4.5 MCG/ACT inhaler Inhale 2 puffs into the lungs 2 (two) times daily. (Patient taking differently: Inhale 2 puffs into the lungs at bedtime. ) 1 Inhaler 6  . busPIRone (BUSPAR) 7.5 MG tablet TAKE 1 TABLET BY MOUTH TWICE DAILY 180 tablet 0  . diazepam (VALIUM) 5 MG tablet TAKE 1 TABLET BY MOUTH TWICE DAILY 60 tablet 1  . diphenhydrAMINE (BENADRYL) 25 MG tablet Take 25 mg by mouth every 6 (six) hours as needed for itching or allergies.    Marland Kitchen escitalopram (LEXAPRO) 10 MG tablet TAKE 1 TABLET BY MOUTH ONCE DAILY 30 tablet 5  . fenofibrate (TRICOR) 145 MG tablet TAKE 1 TABLET BY MOUTH DAILY. 90 tablet 1  . ibuprofen (ADVIL,MOTRIN) 200 MG tablet Take 400 mg by mouth every 8 (eight) hours as needed for mild pain or moderate pain. Reported on 05/31/2015    . levocetirizine (XYZAL) 5 MG tablet Take 1 tablet (5 mg total) by mouth every evening. 30 tablet 5  . metoprolol succinate (TOPROL-XL) 25 MG 24 hr tablet TAKE 1 TABLET BY MOUTH DAILY. 30 tablet 5  . montelukast (SINGULAIR) 10 MG tablet TAKE 1 TABLET BY MOUTH AT BEDTIME. 90 tablet 1  . Multiple Vitamin  (MULTIVITAMIN WITH MINERALS) TABS tablet Take 1 tablet by mouth daily.    Marland Kitchen triamterene-hydrochlorothiazide (DYAZIDE) 37.5-25 MG capsule Take 1 each (1 capsule total) by mouth daily.    Marland Kitchen VASCEPA 1 g CAPS TAKE 2 CAPSULES BY MOUTH TWICE A DAY 360 capsule 1  . ondansetron (ZOFRAN-ODT) 4 MG disintegrating tablet Take 1 tablet (4 mg total) by mouth every 8 (eight) hours as needed for nausea or vomiting. (Patient not taking: Reported on 09/18/2016) 20 tablet 0   Current Facility-Administered Medications on File Prior to Visit  Medication Dose Route Frequency Provider Last Rate Last Dose  . promethazine (PHENERGAN) injection 25 mg  25 mg Intramuscular Once Brunetta Jeans, PA-C        Allergies  Allergen Reactions  . Belsomra [Suvorexant] Other (See Comments)    nightmares  . Percocet [Oxycodone-Acetaminophen] Nausea And Vomiting    Family History  Problem Relation Age of Onset  . Hyperlipidemia Father        Living  . Stroke Father   . Hypertension Father   . Alcohol abuse Mother        Living  . Diabetes Mellitus II Maternal Grandfather   . Hypertension Maternal Grandfather   . Heart failure Maternal Grandfather   . Kidney disease Maternal Grandfather   . Heart disease Maternal Grandmother   . Melanoma Paternal Grandmother   .  Heart attack Paternal Grandfather   . Migraines Brother   . Healthy Brother        x2  . Drug abuse Sister        Died of Overdose    Social History   Social History  . Marital status: Single    Spouse name: N/A  . Number of children: N/A  . Years of education: N/A   Social History Main Topics  . Smoking status: Never Smoker  . Smokeless tobacco: Never Used  . Alcohol use 0.0 oz/week     Comment: 2-3 beers nightly  . Drug use: No  . Sexual activity: Yes    Birth control/ protection: None     Comment: male - 1 partner   Other Topics Concern  . None   Social History Narrative  . None   Review of Systems - See HPI.  All other ROS are  negative.  BP 120/78   Pulse 87   Temp 98.3 F (36.8 C) (Oral)   Resp 14   Ht 5\' 10"  (1.778 m)   Wt 201 lb (91.2 kg)   SpO2 98%   BMI 28.84 kg/m   Physical Exam  Constitutional: He is well-developed, well-nourished, and in no distress.  HENT:  Head: Normocephalic and atraumatic.  Cardiovascular: Normal rate, regular rhythm, normal heart sounds and intact distal pulses.   Pulmonary/Chest: Effort normal.  Skin: Skin is warm and dry.  Receding hairline noted. Male patterned hair loss noted on examination. No evidence of tinea capitis noted.   Vitals reviewed.   Recent Results (from the past 2160 hour(s))  CBC w/Diff     Status: None   Collection Time: 07/10/16  3:22 PM  Result Value Ref Range   WBC 6.4 4.0 - 10.5 K/uL   RBC 5.27 4.22 - 5.81 Mil/uL   Hemoglobin 14.8 13.0 - 17.0 g/dL   HCT 43.6 39.0 - 52.0 %   MCV 82.6 78.0 - 100.0 fl   MCHC 34.0 30.0 - 36.0 g/dL   RDW 13.9 11.5 - 15.5 %   Platelets 243.0 150.0 - 400.0 K/uL   Neutrophils Relative % 62.0 43.0 - 77.0 %   Lymphocytes Relative 24.7 12.0 - 46.0 %   Monocytes Relative 8.4 3.0 - 12.0 %   Eosinophils Relative 3.5 0.0 - 5.0 %   Basophils Relative 1.4 0.0 - 3.0 %   Neutro Abs 4.0 1.4 - 7.7 K/uL   Lymphs Abs 1.6 0.7 - 4.0 K/uL   Monocytes Absolute 0.5 0.1 - 1.0 K/uL   Eosinophils Absolute 0.2 0.0 - 0.7 K/uL   Basophils Absolute 0.1 0.0 - 0.1 K/uL    Assessment/Plan: 1. 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 Will start finasteride 1 mg daily. Follow-up 1 month.     Leeanne Rio, PA-C

## 2016-09-18 NOTE — Progress Notes (Signed)
Pre visit review using our clinic review tool, if applicable. No additional management support is needed unless otherwise documented below in the visit note. 

## 2016-09-18 NOTE — Patient Instructions (Signed)
Please check with the pharmacy regarding the cost of the finasteride.  If there is any issue getting the medication filled, let me know and we can work on an authorization.   Follow-up with me in 1 month after starting medication!

## 2016-10-03 MED FILL — ATORVASTATIN 20 MG TABLET: 20 | 90 days supply | Qty: 90 | Fill #1

## 2016-10-05 ENCOUNTER — Encounter: Payer: Self-pay | Admitting: Physician Assistant

## 2016-10-07 ENCOUNTER — Other Ambulatory Visit: Payer: Self-pay | Admitting: Physician Assistant

## 2016-10-07 ENCOUNTER — Encounter: Payer: Self-pay | Admitting: Physician Assistant

## 2016-10-07 ENCOUNTER — Telehealth: Payer: Self-pay | Admitting: Physician Assistant

## 2016-10-07 MED FILL — METOPROLOL SUCC ER 25 MG TA: 25 | 30 days supply | Qty: 30 | Fill #5

## 2016-10-07 MED FILL — LEVOCETIRIZINE 5 MG TABLET: 5 | 30 days supply | Qty: 30 | Fill #2

## 2016-10-07 MED FILL — busPIRone HCL 7.5 MG TABS: 7.5 | 90 days supply | Qty: 180 | Fill #0

## 2016-10-07 MED FILL — ESCITALOPRAM 10 MG TABLET: 10 | 30 days supply | Qty: 30 | Fill #4

## 2016-10-07 NOTE — Telephone Encounter (Signed)
Received FMLA paperwork by fax, placed in bin upfront with charge sheet.

## 2016-10-07 NOTE — Telephone Encounter (Signed)
Received FMLA.

## 2016-10-08 NOTE — Telephone Encounter (Signed)
FMLA completed and faxed. Will MyChart message patient.

## 2016-10-11 MED FILL — diazePAM 5 MG TABS: 5 | 30 days supply | Qty: 60 | Fill #1

## 2016-10-17 MED FILL — FINASTERIDE 1 MG TABLET: 1 | 30 days supply | Qty: 30 | Fill #1

## 2016-10-18 MED FILL — FENOFIBRATE 145 MG TABLET: 145 | 90 days supply | Qty: 90 | Fill #1

## 2016-10-28 ENCOUNTER — Other Ambulatory Visit: Payer: Self-pay | Admitting: Physician Assistant

## 2016-10-28 MED FILL — MONTELUKAST SOD 10 MG TAB: 10 | 90 days supply | Qty: 90 | Fill #0

## 2016-11-04 ENCOUNTER — Other Ambulatory Visit: Payer: Self-pay | Admitting: Physician Assistant

## 2016-11-05 MED FILL — LEVOCETIRIZINE 5 MG TABLET: 5 | 90 days supply | Qty: 90 | Fill #3

## 2016-11-05 MED FILL — VENTOLIN HFA 90 MCG INHALER: 108 (90 BAS | 25 days supply | Qty: 18 | Fill #1

## 2016-11-05 MED FILL — ESCITALOPRAM 10 MG TABLET: 10 | 30 days supply | Qty: 30 | Fill #5

## 2016-11-06 MED FILL — METOPROLOL SUCC ER 25 MG TA: 25 | 90 days supply | Qty: 90 | Fill #0

## 2016-11-11 MED FILL — TRIAMTERENE/HCTZ 37.5/25 CP: 37.5-25 | 90 days supply | Qty: 90 | Fill #0

## 2016-11-12 ENCOUNTER — Other Ambulatory Visit: Payer: Self-pay | Admitting: Emergency Medicine

## 2016-11-12 MED ORDER — TRIAMTERENE-HCTZ 37.5-25 MG PO CAPS: 1.0000 | ORAL_CAPSULE | Freq: Every day | ORAL | 1 refills | Status: DC

## 2016-11-13 ENCOUNTER — Other Ambulatory Visit: Payer: Self-pay | Admitting: Physician Assistant

## 2016-11-13 MED FILL — diazePAM 5 MG TABS: 5 | 30 days supply | Qty: 60 | Fill #0

## 2016-11-13 NOTE — Telephone Encounter (Signed)
Rx faxed to [facility]

## 2016-11-13 NOTE — Telephone Encounter (Signed)
Last rx Diazepam on 09/06/16 #60 1 RF UDS: 07/10/16 low risk CSC: 11/09/15 Last OV: 09/18/16  Please advise of refill.

## 2016-11-16 ENCOUNTER — Encounter: Payer: Self-pay | Admitting: Physician Assistant

## 2016-11-18 ENCOUNTER — Other Ambulatory Visit: Payer: Self-pay | Admitting: Physician Assistant

## 2016-11-18 MED ORDER — FINASTERIDE 1 MG PO TABS: 1.0000 mg | ORAL_TABLET | Freq: Every day | ORAL | 0 refills | Status: DC

## 2016-11-18 MED FILL — FINASTERIDE 1 MG TABLET: 1 | 30 days supply | Qty: 30 | Fill #0

## 2016-11-22 ENCOUNTER — Encounter: Payer: Self-pay | Admitting: Physician Assistant

## 2016-11-22 ENCOUNTER — Telehealth: Payer: 59 | Admitting: Nurse Practitioner

## 2016-11-22 DIAGNOSIS — R05 Cough: Secondary | ICD-10-CM | POA: Diagnosis not present

## 2016-11-22 DIAGNOSIS — R059 Cough, unspecified: Secondary | ICD-10-CM

## 2016-11-22 MED ORDER — BENZONATATE 100 MG PO CAPS: 100.0000 mg | ORAL_CAPSULE | Freq: Three times a day (TID) | ORAL | 0 refills | Status: DC | PRN

## 2016-11-22 MED ORDER — PREDNISONE 10 MG (21) PO TBPK: ORAL_TABLET | ORAL | 0 refills | Status: DC

## 2016-11-22 MED FILL — predniSONE 10 MG TABS: 10 | 6 days supply | Qty: 21 | Fill #0

## 2016-11-22 MED FILL — BENZONATATE 100 MG CAPS: 100 | 7 days supply | Qty: 20 | Fill #0

## 2016-11-22 NOTE — Progress Notes (Signed)
We are sorry that you are not feeling well.  Here is how we plan to help!  Based on your presentation I believe you most likely have A cough due to a virus.  This is called viral bronchitis and is best treated by rest, plenty of fluids and control of the cough.  You may use Ibuprofen or Tylenol as directed to help your symptoms.     In addition you may use A prescription cough medication called Tessalon Perles 100mg . You may take 1-2 capsules every 8 hours as needed for your cough.  Sterapred 10 mg dosepak  From your responses in the eVisit questionnaire you describe inflammation in the upper respiratory tract which is causing a significant cough.  This is commonly called Bronchitis and has four common causes:    Allergies  Viral Infections  Acid Reflux  Bacterial Infection Allergies, viruses and acid reflux are treated by controlling symptoms or eliminating the cause. An example might be a cough caused by taking certain blood pressure medications. You stop the cough by changing the medication. Another example might be a cough caused by acid reflux. Controlling the reflux helps control the cough.  USE OF BRONCHODILATOR ("RESCUE") INHALERS: There is a risk from using your bronchodilator too frequently.  The risk is that over-reliance on a medication which only relaxes the muscles surrounding the breathing tubes can reduce the effectiveness of medications prescribed to reduce swelling and congestion of the tubes themselves.  Although you feel brief relief from the bronchodilator inhaler, your asthma may actually be worsening with the tubes becoming more swollen and filled with mucus.  This can delay other crucial treatments, such as oral steroid medications. If you need to use a bronchodilator inhaler daily, several times per day, you should discuss this with your provider.  There are probably better treatments that could be used to keep your asthma under control.     HOME CARE . Only take  medications as instructed by your medical team. . Complete the entire course of an antibiotic. . Drink plenty of fluids and get plenty of rest. . Avoid close contacts especially the very young and the elderly . Cover your mouth if you cough or cough into your sleeve. . Always remember to wash your hands . A steam or ultrasonic humidifier can help congestion.   GET HELP RIGHT AWAY IF: . You develop worsening fever. . You become short of breath . You cough up blood. . Your symptoms persist after you have completed your treatment plan MAKE SURE YOU   Understand these instructions.  Will watch your condition.  Will get help right away if you are not doing well or get worse.  Your e-visit answers were reviewed by a board certified advanced clinical practitioner to complete your personal care plan.  Depending on the condition, your plan could have included both over the counter or prescription medications. If there is a problem please reply  once you have received a response from your provider. Your safety is important to [place].  If you have drug allergies check your prescription carefully.    You can use MyChart to ask questions about today's visit, request a non-urgent call back, or ask for a work or school excuse for 24 hours related to this e-Visit. If it has been greater than 24 hours you will need to follow up with your provider, or enter a new e-Visit to address those concerns. You will get an e-mail in the next two days asking about  your experience.  I hope that your e-visit has been valuable and will speed your recovery. Thank you for using e-visits.

## 2016-11-25 ENCOUNTER — Encounter: Payer: Self-pay | Admitting: Physician Assistant

## 2016-11-25 ENCOUNTER — Other Ambulatory Visit: Payer: Self-pay

## 2016-11-25 ENCOUNTER — Ambulatory Visit (INDEPENDENT_AMBULATORY_CARE_PROVIDER_SITE_OTHER): Payer: 59 | Admitting: Physician Assistant

## 2016-11-25 VITALS — BP 128/98 | HR 101 | Temp 98.3°F | Resp 14 | Ht 70.0 in | Wt 211.0 lb

## 2016-11-25 DIAGNOSIS — J069 Acute upper respiratory infection, unspecified: Secondary | ICD-10-CM

## 2016-11-25 MED ORDER — DOXYCYCLINE HYCLATE 100 MG PO CAPS: 100.0000 mg | ORAL_CAPSULE | Freq: Two times a day (BID) | ORAL | 0 refills | Status: DC

## 2016-11-25 MED FILL — DOXYCYCLINE HYCLATE 100 MG: 100 | 7 days supply | Qty: 14 | Fill #0

## 2016-11-25 NOTE — Patient Instructions (Signed)
Take antibiotic (Doxycycline) as directed.  Increase fluids.  Get plenty of rest. Use Mucinex-DM for congestion and cough. Continue Tessalon. Take a daily probiotic (I recommend Align or Culturelle, but even Activia Yogurt may be beneficial).  A humidifier placed in the bedroom may offer some relief for a dry, scratchy throat of nasal irritation.  Read information below on acute bronchitis. Please call or return to clinic if symptoms are not improving.  Acute Bronchitis Bronchitis is when the airways that extend from the windpipe into the lungs get red, puffy, and painful (inflamed). Bronchitis often causes thick spit (mucus) to develop. This leads to a cough. A cough is the most common symptom of bronchitis. In acute bronchitis, the condition usually begins suddenly and goes away over time (usually in 2 weeks). Smoking, allergies, and asthma can make bronchitis worse. Repeated episodes of bronchitis may cause more lung problems.  HOME CARE  Rest.  Drink enough fluids to keep your pee (urine) clear or pale yellow (unless you need to limit fluids as told by your doctor).  Only take over-the-counter or prescription medicines as told by your doctor.  Avoid smoking and secondhand smoke. These can make bronchitis worse. If you are a smoker, think about using nicotine gum or skin patches. Quitting smoking will help your lungs heal faster.  Reduce the chance of getting bronchitis again by:  Washing your hands often.  Avoiding people with cold symptoms.  Trying not to touch your hands to your mouth, nose, or eyes.  Follow up with your doctor as told.  GET HELP IF: Your symptoms do not improve after 1 week of treatment. Symptoms include:  Cough.  Fever.  Coughing up thick spit.  Body aches.  Chest congestion.  Chills.  Shortness of breath.  Sore throat.  GET HELP RIGHT AWAY IF:   You have an increased fever.  You have chills.  You have severe shortness of breath.  You have  bloody thick spit (sputum).  You throw up (vomit) often.  You lose too much body fluid (dehydration).  You have a severe headache.  You faint.  MAKE SURE YOU:   Understand these instructions.  Will watch your condition.  Will get help right away if you are not doing well or get worse. Document Released: 06/19/2007 Document Revised: 09/02/2012 Document Reviewed: 06/23/2012 [facility] Patient Information 2015 [place], [place]. This information is not intended to replace advice given to you by your health care provider. Make sure you discuss any questions you have with your health care provider.

## 2016-11-25 NOTE — Progress Notes (Signed)
Patient presents to clinic today c/o chest congestion with cough that is productive of green sputum. Endorses subjective fevers along with chills. Denies sinus pain but notes ear pressure and flare up of his Meniere's. Denies chest pain or SOB. Has noted some occasional wheezing. Has not been using his Symbicort as directed.  Past Medical History:  Diagnosis Date  . Asthma   . Chicken pox   . Depression    Resolved  . Environmental and seasonal allergies   . Essential hypertension, benign 08/03/2013  . Hyperlipidemia   . Meniere's disease   . Thoracic outlet syndrome     Current Outpatient Medications on File Prior to Visit  Medication Sig Dispense Refill  . albuterol (VENTOLIN HFA) 108 (90 Base) MCG/ACT inhaler INHALE 2 PUFFS BY MOUTH EVERY 6 HOURS AS NEEDED FOR WHEEZING 18 g 2  . atorvastatin (LIPITOR) 20 MG tablet TAKE 1 TABLET BY MOUTH DAILY 30 tablet 5  . benzonatate (TESSALON PERLES) 100 MG capsule Take 1 capsule (100 mg total) 3 (three) times daily as needed by mouth for cough. 20 capsule 0  . budesonide-formoterol (SYMBICORT) 80-4.5 MCG/ACT inhaler Inhale 2 puffs into the lungs 2 (two) times daily. (Patient taking differently: Inhale 2 puffs into the lungs at bedtime. ) 1 Inhaler 6  . busPIRone (BUSPAR) 7.5 MG tablet TAKE 1 TABLET BY MOUTH TWICE DAILY 180 tablet 1  . diazepam (VALIUM) 5 MG tablet TAKE 1 TABLET BY MOUTH TWICE DAILY 60 tablet 1  . diphenhydrAMINE (BENADRYL) 25 MG tablet Take 25 mg by mouth every 6 (six) hours as needed for itching or allergies.    Marland Kitchen escitalopram (LEXAPRO) 10 MG tablet TAKE 1 TABLET BY MOUTH ONCE DAILY 30 tablet 5  . fenofibrate (TRICOR) 145 MG tablet TAKE 1 TABLET BY MOUTH DAILY. 90 tablet 1  . finasteride (PROPECIA) 1 MG tablet Take 1 tablet (1 mg total) daily by mouth. 30 tablet 0  . ibuprofen (ADVIL,MOTRIN) 200 MG tablet Take 400 mg by mouth every 8 (eight) hours as needed for mild pain or moderate pain. Reported on 05/31/2015    .  levocetirizine (XYZAL) 5 MG tablet Take 1 tablet (5 mg total) by mouth every evening. 30 tablet 5  . metoprolol succinate (TOPROL-XL) 25 MG 24 hr tablet TAKE 1 TABLET BY MOUTH DAILY. 30 tablet 5  . montelukast (SINGULAIR) 10 MG tablet TAKE 1 TABLET BY MOUTH AT BEDTIME. 90 tablet 1  . Multiple Vitamin (MULTIVITAMIN WITH MINERALS) TABS tablet Take 1 tablet by mouth daily.    . predniSONE (STERAPRED UNI-PAK 21 TAB) 10 MG (21) TBPK tablet As directed x 6 days 21 tablet 0  . triamterene-hydrochlorothiazide (DYAZIDE) 37.5-25 MG capsule Take 1 each (1 capsule total) by mouth daily. 90 capsule 1  . VASCEPA 1 g CAPS TAKE 2 CAPSULES BY MOUTH TWICE A DAY 360 capsule 1   No current facility-administered medications on file prior to visit.     Allergies  Allergen Reactions  . Belsomra [Suvorexant] Other (See Comments)    nightmares  . Percocet [Oxycodone-Acetaminophen] Nausea And Vomiting    Family History  Problem Relation Age of Onset  . Hyperlipidemia Father        Living  . Stroke Father   . Hypertension Father   . Alcohol abuse Mother        Living  . Diabetes Mellitus II Maternal Grandfather   . Hypertension Maternal Grandfather   . Heart failure Maternal Grandfather   . Kidney disease Maternal Grandfather   .  Heart disease Maternal Grandmother   . Melanoma Paternal Grandmother   . Heart attack Paternal Grandfather   . Migraines Brother   . Healthy Brother        x2  . Drug abuse Sister        Died of Overdose    Social History   Socioeconomic History  . Marital status: Single    Spouse name: None  . Number of children: None  . Years of education: None  . Highest education level: None  Social Needs  . Financial resource strain: None  . Food insecurity - worry: None  . Food insecurity - inability: None  . Transportation needs - medical: None  . Transportation needs - non-medical: None  Occupational History  . None  Tobacco Use  . Smoking status: Never Smoker  .  Smokeless tobacco: Never Used  Substance and Sexual Activity  . Alcohol use: Yes    Alcohol/week: 0.0 oz    Comment: 2-3 beers nightly  . Drug use: No  . Sexual activity: Yes    Birth control/protection: None    Comment: male - 1 partner  Other Topics Concern  . None  Social History Narrative  . None   Review of Systems - See HPI.  All other ROS are negative.  BP (!) 128/98   Pulse (!) 101   Temp 98.3 F (36.8 C) (Oral)   Resp 14   Ht 5\' 10"  (1.778 m)   Wt 211 lb (95.7 kg)   SpO2 98%   BMI 30.28 kg/m   Physical Exam  Constitutional: He is oriented to person, place, and time and well-developed, well-nourished, and in no distress.  HENT:  Head: Normocephalic and atraumatic.  Right Ear: External ear normal.  Left Ear: External ear normal.  Nose: Nose normal.  Mouth/Throat: Oropharynx is clear and moist. No oropharyngeal exudate.  TM within normal limits bilaterally.  Eyes: Conjunctivae are normal.  Cardiovascular: Normal rate, regular rhythm, normal heart sounds and intact distal pulses.  Pulmonary/Chest: Effort normal and breath sounds normal. No respiratory distress. He has no wheezes. He has no rales. He exhibits no tenderness.  Neurological: He is alert and oriented to person, place, and time. No cranial nerve deficit.  Skin: Skin is warm and dry. No rash noted.  Psychiatric: Affect normal.  Vitals reviewed.  Assessment/Plan: 1. Upper respiratory tract infection, unspecified type Giving noted fever and change in sputum, will start ABX. Rx Doxycycline. Supportive measures and OTC medications reviewed. Restart Symbicort as directed. Return precautions discussed with patient.  - doxycycline (VIBRAMYCIN) 100 MG capsule; Take 1 capsule (100 mg total) 2 (two) times daily by mouth.  Dispense: 14 capsule; Refill: 0   Leeanne Rio, [place]

## 2016-11-25 NOTE — Progress Notes (Signed)
Pre visit review using our clinic review tool, if applicable. No additional management support is needed unless otherwise documented below in the visit note. 

## 2016-11-26 ENCOUNTER — Encounter: Payer: Self-pay | Admitting: Physician Assistant

## 2016-11-29 ENCOUNTER — Encounter: Payer: Self-pay | Admitting: Physician Assistant

## 2016-12-09 ENCOUNTER — Other Ambulatory Visit: Payer: Self-pay | Admitting: Physician Assistant

## 2016-12-09 MED FILL — ESCITALOPRAM 10 MG TABLET: 10 | 30 days supply | Qty: 30 | Fill #0

## 2016-12-13 MED FILL — diazePAM 5 MG TABS: 5 | 30 days supply | Qty: 60 | Fill #1

## 2016-12-15 ENCOUNTER — Emergency Department (HOSPITAL_BASED_OUTPATIENT_CLINIC_OR_DEPARTMENT_OTHER)
Admission: EM | Admit: 2016-12-15 | Discharge: 2016-12-15 | Disposition: A | Discharge status: Home / Self Care | Payer: 59 | Attending: Emergency Medicine | Admitting: Emergency Medicine

## 2016-12-15 ENCOUNTER — Emergency Department (HOSPITAL_BASED_OUTPATIENT_CLINIC_OR_DEPARTMENT_OTHER): Payer: 59

## 2016-12-15 ENCOUNTER — Encounter (HOSPITAL_BASED_OUTPATIENT_CLINIC_OR_DEPARTMENT_OTHER): Payer: Self-pay | Admitting: *Deleted

## 2016-12-15 DIAGNOSIS — R0981 Nasal congestion: Secondary | ICD-10-CM | POA: Insufficient documentation

## 2016-12-15 DIAGNOSIS — R0602 Shortness of breath: Secondary | ICD-10-CM | POA: Insufficient documentation

## 2016-12-15 DIAGNOSIS — J020 Streptococcal pharyngitis: Secondary | ICD-10-CM | POA: Diagnosis not present

## 2016-12-15 DIAGNOSIS — Z79899 Other long term (current) drug therapy: Secondary | ICD-10-CM | POA: Diagnosis not present

## 2016-12-15 DIAGNOSIS — I10 Essential (primary) hypertension: Secondary | ICD-10-CM | POA: Insufficient documentation

## 2016-12-15 DIAGNOSIS — R05 Cough: Secondary | ICD-10-CM

## 2016-12-15 DIAGNOSIS — R070 Pain in throat: Secondary | ICD-10-CM | POA: Diagnosis not present

## 2016-12-15 DIAGNOSIS — J45909 Unspecified asthma, uncomplicated: Secondary | ICD-10-CM | POA: Insufficient documentation

## 2016-12-15 DIAGNOSIS — R059 Cough, unspecified: Secondary | ICD-10-CM

## 2016-12-15 LAB — RAPID STREP SCREEN (MED CTR MEBANE ONLY): Streptococcus, Group A Screen (Direct): POSITIVE — AB

## 2016-12-15 MED ORDER — PREDNISONE 20 MG PO TABS: 60.0000 mg | ORAL_TABLET | Freq: Every day | ORAL | 0 refills | Status: DC

## 2016-12-15 MED ORDER — ALBUTEROL SULFATE HFA 108 (90 BASE) MCG/ACT IN AERS: INHALATION_SPRAY | RESPIRATORY_TRACT | 0 refills | Status: DC

## 2016-12-15 MED ORDER — PENICILLIN G BENZATHINE & PROC 1200000 UNIT/2ML IM SUSP
1.2000 10*6.[IU] | Freq: Once | INTRAMUSCULAR | Status: AC
  Administered 2016-12-15: 1.2 10*6.[IU] via INTRAMUSCULAR
  Filled 2016-12-15: qty 2

## 2016-12-15 NOTE — Discharge Instructions (Signed)
Take prednisone as prescribed.  Use albuterol inhaler every 4-6 hours as needed for shortness of breath or wheezing.  Please see your doctor if your symptoms are not improving over the next 4-5 days.  Please return to emergency department if you develop any new or worsening symptoms.

## 2016-12-15 NOTE — ED Triage Notes (Signed)
Pt c/o URI symptoms x 1 week 

## 2016-12-15 NOTE — ED Provider Notes (Signed)
[facility] Provider Note   CSN: [id number] Arrival date & time: 12/15/16  1209     History   Chief Complaint Chief Complaint  Patient presents with  . URI    HPI Steve Jacobs is a 31 y.o. male with history of Mnire's disease, asthma who presents with a 2-week history of cough and one-week history of sore throat.  Patient reports he developed cough and congestion which resolved after 1 week, however it returned about 5 days ago.  He has had associated sore throat and difficulty swallowing since that time.  His cough is dry.  He has had some shortness of breath and has used his inhaler almost every hour.  He has been able to eat and drink, however does hurt to swallow.  He denies any chest pain, fever, abdominal pain, nausea, vomiting.  Patient was treated initially with doxycycline and prednisone and improved, however he has been taking over-the-counter medications without relief recently.  HPI  Past Medical History:  Diagnosis Date  . Asthma   . Chicken pox   . Depression    Resolved  . Environmental and seasonal allergies   . Essential hypertension, benign 08/03/2013  . Hyperlipidemia   . Meniere's disease   . Thoracic outlet syndrome     Patient Active Problem List   Diagnosis Date Noted  . Routine screening for STI (sexually transmitted infection) 06/07/2016  . SVT (supraventricular tachycardia) ([place]) 10/08/2014  . Depression 10/08/2014  . Atypical nevi 06/22/2014  . Elevated triglycerides with high cholesterol 09/06/2013  . Anxiety state 08/03/2013  . Essential hypertension, benign 08/03/2013  . Environmental allergies 03/23/2013  . Visit for preventive health examination 03/23/2013  . Meniere disease 03/23/2013  . Intrinsic asthma     Past Surgical History:  Procedure Laterality Date  . Nevus Biopsy         Home Medications    Prior to Admission medications   Medication Sig Start Date End Date Taking? Authorizing  Provider  albuterol (VENTOLIN HFA) 108 (90 Base) MCG/ACT inhaler INHALE 2 PUFFS BY MOUTH EVERY 6 HOURS AS NEEDED FOR WHEEZING 12/15/16   Velita Quirk M, PA-C  atorvastatin (LIPITOR) 20 MG tablet TAKE 1 TABLET BY MOUTH DAILY 06/17/16   Brunetta Jeans, PA-C  benzonatate (TESSALON PERLES) 100 MG capsule Take 1 capsule (100 mg total) 3 (three) times daily as needed by mouth for cough. 11/22/16   Hassell Done, Mary-Margaret, FNP  budesonide-formoterol (SYMBICORT) 80-4.5 MCG/ACT inhaler Inhale 2 puffs into the lungs 2 (two) times daily. Patient taking differently: Inhale 2 puffs into the lungs at bedtime.  06/22/14   Brunetta Jeans, PA-C  busPIRone (BUSPAR) 7.5 MG tablet TAKE 1 TABLET BY MOUTH TWICE DAILY 10/07/16   Brunetta Jeans, PA-C  diazepam (VALIUM) 5 MG tablet TAKE 1 TABLET BY MOUTH TWICE DAILY 11/13/16   Brunetta Jeans, PA-C  diphenhydrAMINE (BENADRYL) 25 MG tablet Take 25 mg by mouth every 6 (six) hours as needed for itching or allergies.    [provider]  doxycycline (VIBRAMYCIN) 100 MG capsule Take 1 capsule (100 mg total) 2 (two) times daily by mouth. 11/25/16   Brunetta Jeans, PA-C  escitalopram (LEXAPRO) 10 MG tablet TAKE 1 TABLET BY MOUTH ONCE DAILY 12/09/16   Brunetta Jeans, PA-C  fenofibrate (TRICOR) 145 MG tablet TAKE 1 TABLET BY MOUTH DAILY. 07/15/16   Brunetta Jeans, PA-C  finasteride (PROPECIA) 1 MG tablet Take 1 tablet (1 mg total) daily by  mouth. 11/18/16   Brunetta Jeans, PA-C  ibuprofen (ADVIL,MOTRIN) 200 MG tablet Take 400 mg by mouth every 8 (eight) hours as needed for mild pain or moderate pain. Reported on 05/31/2015    [provider]  levocetirizine (XYZAL) 5 MG tablet Take 1 tablet (5 mg total) by mouth every evening. 08/08/16   Brunetta Jeans, PA-C  metoprolol succinate (TOPROL-XL) 25 MG 24 hr tablet TAKE 1 TABLET BY MOUTH DAILY. 11/04/16   Brunetta Jeans, PA-C  montelukast (SINGULAIR) 10 MG tablet TAKE 1 TABLET BY MOUTH AT BEDTIME.  10/28/16   Brunetta Jeans, PA-C  Multiple Vitamin (MULTIVITAMIN WITH MINERALS) TABS tablet Take 1 tablet by mouth daily.    [provider]  predniSONE (DELTASONE) 20 MG tablet Take 3 tablets (60 mg total) by mouth daily. 12/15/16   Mykalah Saari, Bea Graff, PA-C  triamterene-hydrochlorothiazide (DYAZIDE) 37.5-25 MG capsule Take 1 each (1 capsule total) by mouth daily. 11/12/16   Brunetta Jeans, PA-C  VASCEPA 1 g CAPS TAKE 2 CAPSULES BY MOUTH TWICE A DAY 07/15/16   Brunetta Jeans, PA-C    Family History Family History  Problem Relation Age of Onset  . Hyperlipidemia Father        Living  . Stroke Father   . Hypertension Father   . Alcohol abuse Mother        Living  . Diabetes Mellitus II Maternal Grandfather   . Hypertension Maternal Grandfather   . Heart failure Maternal Grandfather   . Kidney disease Maternal Grandfather   . Heart disease Maternal Grandmother   . Melanoma Paternal Grandmother   . Heart attack Paternal Grandfather   . Migraines Brother   . Healthy Brother        x2  . Drug abuse Sister        Died of Overdose    Social History Social History   Tobacco Use  . Smoking status: Never Smoker  . Smokeless tobacco: Never Used  Substance Use Topics  . Alcohol use: Yes    Alcohol/week: 0.0 oz    Comment: 2-3 beers nightly  . Drug use: No     Allergies   Belsomra [suvorexant] and Percocet [oxycodone-acetaminophen]   Review of Systems Review of Systems  Constitutional: Negative for fever.  HENT: Positive for congestion, ear pain (L) and sore throat.   Respiratory: Positive for cough and shortness of breath.   Cardiovascular: Negative for chest pain.     Physical Exam Updated Vital Signs BP (!) 161/95 (BP Location: Left Arm)   Pulse (!) 116   Temp 98.4 F (36.9 C) (Oral)   Resp 18   SpO2 100%   Physical Exam  Constitutional: He appears well-developed and well-nourished. No distress.  HENT:  Head: Normocephalic and atraumatic.    Mouth/Throat: Posterior oropharyngeal edema and posterior oropharyngeal erythema present. No oropharyngeal exudate or tonsillar abscesses. Tonsils are 2+ on the right. Tonsils are 2+ on the left. No tonsillar exudate.  Eyes: Conjunctivae are normal. Pupils are equal, round, and reactive to light. Right eye exhibits no discharge. Left eye exhibits no discharge. No scleral icterus.  Neck: Normal range of motion. Neck supple. No thyromegaly present.  Cardiovascular: Regular rhythm, normal heart sounds and intact distal pulses. Exam reveals no gallop and no friction rub.  No murmur heard. Pulmonary/Chest: Effort normal and breath sounds normal. No stridor. No respiratory distress. He has no wheezes. He has no rales.  Abdominal: Soft. Bowel sounds are normal. He exhibits  no distension. There is no tenderness. There is no rebound and no guarding.  Musculoskeletal: He exhibits no edema.  Lymphadenopathy:    He has no cervical adenopathy.  Neurological: He is alert. Coordination normal.  Skin: Skin is warm and dry. No rash noted. He is not diaphoretic. No pallor.  Psychiatric: He has a normal mood and affect.  Nursing note and vitals reviewed.    ED Treatments / Results  Labs (all labs ordered are listed, but only abnormal results are displayed) Labs Reviewed  RAPID STREP SCREEN (NOT AT [facility]) - Abnormal; Notable for the following components:      Result Value   Streptococcus, Group A Screen (Direct) POSITIVE (*)    All other components within normal limits    EKG  EKG Interpretation None       Radiology Dg Chest 2 View  Result Date: 12/15/2016 CLINICAL DATA:  Cough, congestion EXAM: CHEST  2 VIEW COMPARISON:  05/15/2012 FINDINGS: Heart and mediastinal contours are within normal limits. No focal opacities or effusions. No acute bony abnormality. IMPRESSION: No active cardiopulmonary disease. Electronically Signed   By: Rolm Baptise M.D.   On: 12/15/2016 14:38     Procedures Procedures (including critical care time)  Medications Ordered in ED Medications  penicillin g procaine-penicillin g benzathine (BICILLIN-CR) injection 600000-600000 units (1.2 Million Units Intramuscular Given 12/15/16 1530)     Initial Impression / Assessment and Plan / ED Course  I have reviewed the triage vital signs and the nursing notes.  Pertinent labs & imaging results that were available during my care of the patient were reviewed by me and considered in my medical decision making (see chart for details).     Patient with positive rapid strep test.  No sign of peritonsillar abscess.  Chest x-ray is negative.  Patient given IM penicillin in the ED.  Will discharge home with 5-day prednisone burst for asthma exacerbation as well as refill of inhaler.  Patient advised to follow-up with PCP in 4-5 days if symptoms are not improving.  Return precautions discussed.  Patient understands and agrees with plan.  Patient vitals stable and discharged in satisfactory condition.  Final Clinical Impressions(s) / ED Diagnoses   Final diagnoses:  Strep pharyngitis  Cough    ED Discharge Orders        Ordered    predniSONE (DELTASONE) 20 MG tablet  Daily     12/15/16 1521    albuterol (VENTOLIN HFA) 108 (90 Base) MCG/ACT inhaler  Status:  Discontinued     12/15/16 1523    albuterol (VENTOLIN HFA) 108 (90 Base) MCG/ACT inhaler     12/15/16 [address] PA-C 12/15/16 1555    Little, Wenda Overland, MD 12/15/16 2102

## 2016-12-15 NOTE — ED Notes (Signed)
ED Provider at bedside. 

## 2016-12-18 ENCOUNTER — Ambulatory Visit (INDEPENDENT_AMBULATORY_CARE_PROVIDER_SITE_OTHER): Payer: 59 | Admitting: Physician Assistant

## 2016-12-18 ENCOUNTER — Encounter: Payer: Self-pay | Admitting: Physician Assistant

## 2016-12-18 VITALS — BP 130/88 | HR 99 | Temp 97.7°F | Resp 14 | Ht 70.0 in | Wt 218.0 lb

## 2016-12-18 DIAGNOSIS — J020 Streptococcal pharyngitis: Secondary | ICD-10-CM | POA: Diagnosis not present

## 2016-12-18 DIAGNOSIS — B9789 Other viral agents as the cause of diseases classified elsewhere: Secondary | ICD-10-CM | POA: Diagnosis not present

## 2016-12-18 DIAGNOSIS — J069 Acute upper respiratory infection, unspecified: Secondary | ICD-10-CM

## 2016-12-18 MED ORDER — RANITIDINE HCL 300 MG PO TABS: 300.0000 mg | ORAL_TABLET | Freq: Every day | ORAL | 0 refills | Status: DC

## 2016-12-18 MED FILL — FINASTERIDE 1 MG TABLET: 1 | 30 days supply | Qty: 30 | Fill #0

## 2016-12-18 MED FILL — raNITIdine HCL 300 MG TABS: 300 | 30 days supply | Qty: 30 | Fill #0

## 2016-12-18 NOTE — Progress Notes (Signed)
Patient presents to clinic today for ER follow-up. Patient seen in ER on 12/15/16 with complaints of throat swelling along with URI symptoms. ER assessment included strep test that was + and negative CXR. Patient was given IM Penicillin and started on a prednisone burst of 60 mg daily x 5 days. Patient endorses throat pain and swelling is markedly better. Pain is actually resolved. Still having a mild dry cough with PND. Denies chest pain or SOB. Denies fever. Has noted some heart burn and dry mouth since starting prednisone.   Past Medical History:  Diagnosis Date  . Asthma   . Chicken pox   . Depression    Resolved  . Environmental and seasonal allergies   . Essential hypertension, benign 08/03/2013  . Hyperlipidemia   . Meniere's disease   . Thoracic outlet syndrome     Current Outpatient Medications on File Prior to Visit  Medication Sig Dispense Refill  . albuterol (VENTOLIN HFA) 108 (90 Base) MCG/ACT inhaler INHALE 2 PUFFS BY MOUTH EVERY 6 HOURS AS NEEDED FOR WHEEZING 18 g 0  . atorvastatin (LIPITOR) 20 MG tablet TAKE 1 TABLET BY MOUTH DAILY 30 tablet 5  . budesonide-formoterol (SYMBICORT) 80-4.5 MCG/ACT inhaler Inhale 2 puffs into the lungs 2 (two) times daily. (Patient taking differently: Inhale 2 puffs into the lungs at bedtime. ) 1 Inhaler 6  . busPIRone (BUSPAR) 7.5 MG tablet TAKE 1 TABLET BY MOUTH TWICE DAILY 180 tablet 1  . diazepam (VALIUM) 5 MG tablet TAKE 1 TABLET BY MOUTH TWICE DAILY 60 tablet 1  . diphenhydrAMINE (BENADRYL) 25 MG tablet Take 25 mg by mouth every 6 (six) hours as needed for itching or allergies.    Marland Kitchen escitalopram (LEXAPRO) 10 MG tablet TAKE 1 TABLET BY MOUTH ONCE DAILY 30 tablet 5  . fenofibrate (TRICOR) 145 MG tablet TAKE 1 TABLET BY MOUTH DAILY. 90 tablet 1  . finasteride (PROPECIA) 1 MG tablet Take 1 tablet (1 mg total) daily by mouth. 30 tablet 0  . ibuprofen (ADVIL,MOTRIN) 200 MG tablet Take 400 mg by mouth every 8 (eight) hours as needed for mild  pain or moderate pain. Reported on 05/31/2015    . levocetirizine (XYZAL) 5 MG tablet Take 1 tablet (5 mg total) by mouth every evening. 30 tablet 5  . metoprolol succinate (TOPROL-XL) 25 MG 24 hr tablet TAKE 1 TABLET BY MOUTH DAILY. 30 tablet 5  . montelukast (SINGULAIR) 10 MG tablet TAKE 1 TABLET BY MOUTH AT BEDTIME. 90 tablet 1  . Multiple Vitamin (MULTIVITAMIN WITH MINERALS) TABS tablet Take 1 tablet by mouth daily.    . predniSONE (DELTASONE) 20 MG tablet Take 3 tablets (60 mg total) by mouth daily. 15 tablet 0  . VASCEPA 1 g CAPS TAKE 2 CAPSULES BY MOUTH TWICE A DAY 360 capsule 1  . triamterene-hydrochlorothiazide (DYAZIDE) 37.5-25 MG capsule Take 1 each (1 capsule total) by mouth daily. (Patient not taking: Reported on 12/18/2016) 90 capsule 1   No current facility-administered medications on file prior to visit.     Allergies  Allergen Reactions  . Belsomra [Suvorexant] Other (See Comments)    nightmares  . Percocet [Oxycodone-Acetaminophen] Nausea And Vomiting    Family History  Problem Relation Age of Onset  . Hyperlipidemia Father        Living  . Stroke Father   . Hypertension Father   . Alcohol abuse Mother        Living  . Diabetes Mellitus II Maternal Grandfather   .  Hypertension Maternal Grandfather   . Heart failure Maternal Grandfather   . Kidney disease Maternal Grandfather   . Heart disease Maternal Grandmother   . Melanoma Paternal Grandmother   . Heart attack Paternal Grandfather   . Migraines Brother   . Healthy Brother        x2  . Drug abuse Sister        Died of Overdose    Social History   Socioeconomic History  . Marital status: Single    Spouse name: None  . Number of children: None  . Years of education: None  . Highest education level: None  Social Needs  . Financial resource strain: None  . Food insecurity - worry: None  . Food insecurity - inability: None  . Transportation needs - medical: None  . Transportation needs - non-medical:  None  Occupational History  . None  Tobacco Use  . Smoking status: Never Smoker  . Smokeless tobacco: Never Used  Substance and Sexual Activity  . Alcohol use: Yes    Alcohol/week: 0.0 oz    Comment: 2-3 beers nightly  . Drug use: No  . Sexual activity: Yes    Birth control/protection: None    Comment: male - 1 partner  Other Topics Concern  . None  Social History Narrative  . None   Review of Systems - See HPI.  All other ROS are negative.  BP 130/88   Pulse 99   Temp 97.7 F (36.5 C) (Oral)   Resp 14   Ht 5\' 10"  (1.778 m)   Wt 218 lb (98.9 kg)   SpO2 98%   BMI 31.28 kg/m   Physical Exam  Constitutional: He is oriented to person, place, and time and well-developed, well-nourished, and in no distress.  HENT:  Head: Normocephalic and atraumatic.  Right Ear: Tympanic membrane and external ear normal.  Left Ear: Tympanic membrane and external ear normal.  Nose: Nose normal.  Mouth/Throat: Uvula is midline and mucous membranes are normal. Posterior oropharyngeal edema present. No oropharyngeal exudate, posterior oropharyngeal erythema or tonsillar abscesses.  Eyes: Conjunctivae are normal.  Neck: Neck supple.  Cardiovascular: Normal rate, regular rhythm, normal heart sounds and intact distal pulses.  Pulmonary/Chest: Effort normal and breath sounds normal. No respiratory distress. He has no wheezes. He has no rales. He exhibits no tenderness.  Neurological: He is alert and oriented to person, place, and time.  Skin: Skin is warm and dry.  Psychiatric: Affect normal.  Vitals reviewed.  Recent Results (from the past 2160 hour(s))  Rapid strep screen     Status: Abnormal   Collection Time: 12/15/16  2:04 PM  Result Value Ref Range   Streptococcus, Group A Screen (Direct) POSITIVE (A) NEGATIVE   Assessment/Plan: 1. Strep throat No erythema or exudate noted. Seems IM penicillin has help resolve. Residual swelling noted. Finish steroids. Continue supportive  measures.  2. Viral URI with cough Improving. Continue supportive measures and prednisone. OTC medications reviewed.   Leeanne Rio, PA-C

## 2016-12-18 NOTE — Patient Instructions (Signed)
Please stay well-hydrated and get plenty of rest. Finish entire course of steroid. Start the Ranitidine at night as directed. Keep a bland diet. Get some Biotene mouthwash to help with dry mouth. Place a humidifier in the bedroom to help with this.  Symptoms should continue to improve daily. If you note any new or worsening symptoms, please call or come see me.

## 2016-12-25 ENCOUNTER — Telehealth: Payer: 59 | Admitting: Family

## 2016-12-25 DIAGNOSIS — M545 Low back pain, unspecified: Secondary | ICD-10-CM

## 2016-12-25 DIAGNOSIS — M25511 Pain in right shoulder: Secondary | ICD-10-CM | POA: Diagnosis not present

## 2016-12-25 MED ORDER — NAPROXEN 500 MG PO TABS: 500.0000 mg | ORAL_TABLET | Freq: Two times a day (BID) | ORAL | 0 refills | Status: DC

## 2016-12-25 MED ORDER — CYCLOBENZAPRINE HCL 10 MG PO TABS: 10.0000 mg | ORAL_TABLET | Freq: Three times a day (TID) | ORAL | 1 refills | Status: DC | PRN

## 2016-12-25 MED FILL — NAPROXEN 500 MG TABLET: 500 | 15 days supply | Qty: 30 | Fill #0

## 2016-12-25 MED FILL — CYCLOBENZAPRINE 10 MG TAB: 10 | 10 days supply | Qty: 30 | Fill #0

## 2016-12-25 NOTE — Progress Notes (Signed)

## 2017-01-08 ENCOUNTER — Other Ambulatory Visit: Payer: Self-pay | Admitting: Physician Assistant

## 2017-01-13 ENCOUNTER — Other Ambulatory Visit: Payer: Self-pay | Admitting: Physician Assistant

## 2017-01-13 MED FILL — ESCITALOPRAM 10 MG TABLET: 10 | 30 days supply | Qty: 30 | Fill #1

## 2017-01-13 NOTE — Telephone Encounter (Signed)
Last refill of Diazepam was 11/13/16 #03 1 RF CSC: 11/09/15 UDS:07/01/16  Please advise of refill

## 2017-01-15 ENCOUNTER — Encounter: Payer: Self-pay | Admitting: Emergency Medicine

## 2017-01-15 ENCOUNTER — Other Ambulatory Visit: Payer: Self-pay | Admitting: Physician Assistant

## 2017-01-15 ENCOUNTER — Encounter: Payer: Self-pay | Admitting: Physician Assistant

## 2017-01-15 NOTE — Telephone Encounter (Signed)
Rx printed and signed for pick up. He needs to update his CSC. I have printed a copy for completion and attached to the Rx. They are at the front desk for him to pick up and complete.

## 2017-01-16 ENCOUNTER — Other Ambulatory Visit: Payer: Self-pay | Admitting: Physician Assistant

## 2017-01-17 ENCOUNTER — Encounter: Payer: Self-pay | Admitting: Physician Assistant

## 2017-01-17 MED FILL — ATORVASTATIN 20 MG TABLET: 20 | 90 days supply | Qty: 90 | Fill #0

## 2017-01-17 MED FILL — diazePAM 5 MG TABS: 5 | 30 days supply | Qty: 60 | Fill #0

## 2017-01-17 MED FILL — VASCEPA 1 GM CAPSULE: 1 | 90 days supply | Qty: 360 | Fill #1

## 2017-01-20 MED FILL — FINASTERIDE 1 MG TABLET: 1 | 30 days supply | Qty: 30 | Fill #1

## 2017-01-29 ENCOUNTER — Telehealth: Payer: No Typology Code available for payment source | Admitting: Family

## 2017-01-29 DIAGNOSIS — J019 Acute sinusitis, unspecified: Secondary | ICD-10-CM

## 2017-01-29 MED ORDER — AMOXICILLIN-POT CLAVULANATE 875-125 MG PO TABS: 1.0000 | ORAL_TABLET | Freq: Two times a day (BID) | ORAL | 0 refills | Status: DC

## 2017-01-29 NOTE — Progress Notes (Signed)

## 2017-01-31 ENCOUNTER — Ambulatory Visit: Payer: Self-pay | Admitting: Physician Assistant

## 2017-01-31 ENCOUNTER — Other Ambulatory Visit: Payer: Self-pay

## 2017-01-31 ENCOUNTER — Encounter: Payer: Self-pay | Admitting: Physician Assistant

## 2017-01-31 ENCOUNTER — Ambulatory Visit (INDEPENDENT_AMBULATORY_CARE_PROVIDER_SITE_OTHER): Payer: No Typology Code available for payment source | Admitting: Physician Assistant

## 2017-01-31 ENCOUNTER — Other Ambulatory Visit: Payer: Self-pay | Admitting: Physician Assistant

## 2017-01-31 VITALS — BP 130/88 | HR 88 | Temp 98.1°F | Resp 14 | Ht 70.0 in | Wt 210.0 lb

## 2017-01-31 DIAGNOSIS — B9689 Other specified bacterial agents as the cause of diseases classified elsewhere: Secondary | ICD-10-CM | POA: Diagnosis not present

## 2017-01-31 DIAGNOSIS — F5101 Primary insomnia: Secondary | ICD-10-CM | POA: Diagnosis not present

## 2017-01-31 DIAGNOSIS — J019 Acute sinusitis, unspecified: Secondary | ICD-10-CM

## 2017-01-31 MED ORDER — TRAZODONE HCL 50 MG PO TABS: 25.0000 mg | ORAL_TABLET | Freq: Every evening | ORAL | 3 refills | Status: DC | PRN

## 2017-01-31 MED ORDER — DOXYCYCLINE HYCLATE 100 MG PO CAPS: 100.0000 mg | ORAL_CAPSULE | Freq: Two times a day (BID) | ORAL | 0 refills | Status: DC

## 2017-01-31 MED FILL — traZODone HCL 50 MG TABS: 50 | 30 days supply | Qty: 15 | Fill #0

## 2017-01-31 MED FILL — DOXYCYCLINE HYCLATE 100 MG: 100 | 10 days supply | Qty: 20 | Fill #0

## 2017-01-31 MED FILL — FENOFIBRATE 145 MG TABLET: 145 | 90 days supply | Qty: 90 | Fill #0

## 2017-01-31 NOTE — Patient Instructions (Signed)
Please stop the Augmentin. Start the Doxycycline. Keep hydrated and get plenty of rest. Start a daily probiotic. Place a humidifier in the bedroom.   Start the Trazodone as needed at night for sleep. Let me know how this is working.

## 2017-01-31 NOTE — Progress Notes (Signed)
Patient presents to clinic today for follow-up after submitting an e-visit for sinusitis. Patient had e-visit 2 days ago and diagnosed with bacterial sinusitis. Was started on Augmentin along with OTC medication. Is taking as directed. Notes improvement in sinus symptoms but having significant nausea and loose stools with Augmentin. Is taking with food as directed. Is wanting to discuss medication for stomach or if abx should be changed.   Patient also having significant issue with insomnia, both falling and staying asleep. Endorses averaging about 3 hours of sleep per night most nights. Denies any increased stressors or anxiety. Has improved diet and exercise regimen. Has tried multiple OTC medications without improvement. Has previously been on trial of Belsomra but could not tolerate due to significant nightmares.  Past Medical History:  Diagnosis Date  . Asthma   . Chicken pox   . Depression    Resolved  . Environmental and seasonal allergies   . Essential hypertension, benign 08/03/2013  . Hyperlipidemia   . Meniere's disease   . Thoracic outlet syndrome     Current Outpatient Medications on File Prior to Visit  Medication Sig Dispense Refill  . albuterol (VENTOLIN HFA) 108 (90 Base) MCG/ACT inhaler INHALE 2 PUFFS BY MOUTH EVERY 6 HOURS AS NEEDED FOR WHEEZING 18 g 0  . amoxicillin-clavulanate (AUGMENTIN) 875-125 MG tablet Take 1 tablet by mouth 2 (two) times daily. 14 tablet 0  . atorvastatin (LIPITOR) 20 MG tablet TAKE 1 TABLET BY MOUTH DAILY 30 tablet 5  . budesonide-formoterol (SYMBICORT) 80-4.5 MCG/ACT inhaler Inhale 2 puffs into the lungs 2 (two) times daily. (Patient taking differently: Inhale 2 puffs into the lungs at bedtime. ) 1 Inhaler 6  . busPIRone (BUSPAR) 7.5 MG tablet TAKE 1 TABLET BY MOUTH TWICE DAILY 180 tablet 1  . cyclobenzaprine (FLEXERIL) 10 MG tablet Take 1 tablet (10 mg total) by mouth 3 (three) times daily as needed for muscle spasms. 30 tablet 1  . diazepam  (VALIUM) 5 MG tablet TAKE 1 TABLET BY MOUTH TWICE DAILY 60 tablet 1  . diphenhydrAMINE (BENADRYL) 25 MG tablet Take 25 mg by mouth every 6 (six) hours as needed for itching or allergies.    Marland Kitchen escitalopram (LEXAPRO) 10 MG tablet TAKE 1 TABLET BY MOUTH ONCE DAILY 30 tablet 5  . fenofibrate (TRICOR) 145 MG tablet TAKE 1 TABLET BY MOUTH DAILY. 90 tablet 1  . finasteride (PROPECIA) 1 MG tablet Take 1 tablet (1 mg total) daily by mouth. 30 tablet 0  . ibuprofen (ADVIL,MOTRIN) 200 MG tablet Take 400 mg by mouth every 8 (eight) hours as needed for mild pain or moderate pain. Reported on 05/31/2015    . levocetirizine (XYZAL) 5 MG tablet Take 1 tablet (5 mg total) by mouth every evening. 30 tablet 5  . metoprolol succinate (TOPROL-XL) 25 MG 24 hr tablet TAKE 1 TABLET BY MOUTH DAILY. 30 tablet 5  . montelukast (SINGULAIR) 10 MG tablet TAKE 1 TABLET BY MOUTH AT BEDTIME. 90 tablet 1  . Multiple Vitamin (MULTIVITAMIN WITH MINERALS) TABS tablet Take 1 tablet by mouth daily.    . naproxen (NAPROSYN) 500 MG tablet Take 1 tablet (500 mg total) by mouth 2 (two) times daily with a meal. 30 tablet 0  . ranitidine (ZANTAC) 300 MG tablet Take 1 tablet (300 mg total) by mouth at bedtime. 30 tablet 0  . triamterene-hydrochlorothiazide (DYAZIDE) 37.5-25 MG capsule Take 1 each (1 capsule total) by mouth daily. 90 capsule 1  . VASCEPA 1 g CAPS TAKE 2  CAPSULES BY MOUTH TWICE A DAY 360 capsule 1   No current facility-administered medications on file prior to visit.     Allergies  Allergen Reactions  . Belsomra [Suvorexant] Other (See Comments)    nightmares  . Percocet [Oxycodone-Acetaminophen] Nausea And Vomiting    Family History  Problem Relation Age of Onset  . Hyperlipidemia Father        Living  . Stroke Father   . Hypertension Father   . Alcohol abuse Mother        Living  . Diabetes Mellitus II Maternal Grandfather   . Hypertension Maternal Grandfather   . Heart failure Maternal Grandfather   . Kidney  disease Maternal Grandfather   . Heart disease Maternal Grandmother   . Melanoma Paternal Grandmother   . Heart attack Paternal Grandfather   . Migraines Brother   . Healthy Brother        x2  . Drug abuse Sister        Died of Overdose    Social History   Socioeconomic History  . Marital status: Single    Spouse name: None  . Number of children: None  . Years of education: None  . Highest education level: None  Social Needs  . Financial resource strain: None  . Food insecurity - worry: None  . Food insecurity - inability: None  . Transportation needs - medical: None  . Transportation needs - non-medical: None  Occupational History  . None  Tobacco Use  . Smoking status: Never Smoker  . Smokeless tobacco: Never Used  Substance and Sexual Activity  . Alcohol use: Yes    Alcohol/week: 0.0 oz    Comment: 2-3 beers nightly  . Drug use: No  . Sexual activity: Yes    Birth control/protection: None    Comment: male - 1 partner  Other Topics Concern  . None  Social History Narrative  . None    Review of Systems - See HPI.  All other ROS are negative.  BP 130/88   Pulse 88   Temp 98.1 F (36.7 C) (Oral)   Resp 14   Ht 5\' 10"  (1.778 m)   Wt 210 lb (95.3 kg)   SpO2 99%   BMI 30.13 kg/m   Physical Exam  Constitutional: He is well-developed, well-nourished, and in no distress.  HENT:  Head: Normocephalic and atraumatic.  Right Ear: Tympanic membrane normal.  Left Ear: Tympanic membrane normal.  Nose: Mucosal edema and rhinorrhea present. Right sinus exhibits frontal sinus tenderness.  Mouth/Throat: Uvula is midline, oropharynx is clear and moist and mucous membranes are normal.  Eyes: Conjunctivae are normal.  Neck: Neck supple.  Cardiovascular: Normal rate, regular rhythm, normal heart sounds and intact distal pulses.  Pulmonary/Chest: Effort normal and breath sounds normal. No respiratory distress. He has no wheezes. He has no rales. He exhibits no tenderness.   Skin: Skin is warm and dry. No rash noted.  Psychiatric: Affect normal.  Vitals reviewed.   Recent Results (from the past 2160 hour(s))  Rapid strep screen     Status: Abnormal   Collection Time: 12/15/16  2:04 PM  Result Value Ref Range   Streptococcus, Group A Screen (Direct) POSITIVE (A) NEGATIVE    Assessment/Plan: 1. Acute bacterial sinusitis Stop Augmentin 2/2 intolerance. Start daily probiotic. Rx Doxycyline that he has tolerated well previously.  Increase fluids.  Rest.  Saline nasal spray.  Probiotic.  Mucinex as directed.  Humidifier in bedroom.  Call or return to  clinic if symptoms are not improving.   2. Primary insomnia Discussed options. Sleep hygiene practices reviewed. Will attempt trial of Trazodone 25 mg nightly for sleep. Follow-up discussed with patient.     Leeanne Rio, PA-C

## 2017-02-02 DIAGNOSIS — J019 Acute sinusitis, unspecified: Principal | ICD-10-CM | POA: Insufficient documentation

## 2017-02-02 DIAGNOSIS — F5101 Primary insomnia: Secondary | ICD-10-CM | POA: Insufficient documentation

## 2017-02-02 DIAGNOSIS — B9689 Other specified bacterial agents as the cause of diseases classified elsewhere: Secondary | ICD-10-CM | POA: Insufficient documentation

## 2017-02-06 ENCOUNTER — Encounter: Payer: Self-pay | Admitting: Physician Assistant

## 2017-02-06 ENCOUNTER — Other Ambulatory Visit: Payer: Self-pay | Admitting: Physician Assistant

## 2017-02-06 MED FILL — VENTOLIN HFA 90 MCG INHALER: 108 (90 BAS | 25 days supply | Qty: 18 | Fill #0

## 2017-02-07 MED ORDER — FINASTERIDE 1 MG PO TABS: 1.0000 mg | ORAL_TABLET | Freq: Every day | ORAL | 0 refills | Status: DC

## 2017-02-07 MED FILL — FINASTERIDE 1 MG TABLET: 1 | 90 days supply | Qty: 90 | Fill #0

## 2017-02-09 ENCOUNTER — Encounter: Payer: Self-pay | Admitting: Physician Assistant

## 2017-02-10 ENCOUNTER — Encounter: Payer: Self-pay | Admitting: Physician Assistant

## 2017-02-10 ENCOUNTER — Other Ambulatory Visit: Payer: Self-pay | Admitting: Physician Assistant

## 2017-02-10 MED FILL — TRIAMTERENE/HCTZ 37.5/25 CP: 37.5-25 | 90 days supply | Qty: 90 | Fill #0

## 2017-02-10 MED FILL — MONTELUKAST SOD 10 MG TAB: 10 | 90 days supply | Qty: 90 | Fill #1

## 2017-02-10 MED FILL — METOPROLOL SUCC ER 25 MG TA: 25 | 90 days supply | Qty: 90 | Fill #1

## 2017-02-10 MED FILL — LEVOCETIRIZINE 5 MG TABLET: 5 | 30 days supply | Qty: 30 | Fill #0

## 2017-02-11 ENCOUNTER — Encounter: Payer: Self-pay | Admitting: Physician Assistant

## 2017-02-11 DIAGNOSIS — J45909 Unspecified asthma, uncomplicated: Secondary | ICD-10-CM

## 2017-02-12 MED FILL — ESCITALOPRAM 10 MG TABLET: 10 | 30 days supply | Qty: 30 | Fill #2

## 2017-02-14 MED FILL — diazePAM 5 MG TABS: 5 | 30 days supply | Qty: 60 | Fill #1

## 2017-02-17 MED FILL — busPIRone HCL 7.5 MG TABS: 7.5 | 90 days supply | Qty: 180 | Fill #1

## 2017-02-18 ENCOUNTER — Encounter: Payer: Self-pay | Admitting: Physician Assistant

## 2017-02-18 ENCOUNTER — Ambulatory Visit: Payer: No Typology Code available for payment source | Admitting: Emergency Medicine

## 2017-02-18 ENCOUNTER — Encounter: Payer: Self-pay | Admitting: Emergency Medicine

## 2017-02-18 VITALS — BP 160/90 | HR 91 | Ht 70.0 in | Wt 211.0 lb

## 2017-02-18 DIAGNOSIS — J45909 Unspecified asthma, uncomplicated: Secondary | ICD-10-CM

## 2017-02-18 NOTE — Patient Instructions (Signed)
Please start using 2 puffs twice a day.  Remember to rinse and gargle after using this medication.  Keep albuterol available to use 2 puffs if needed for chest tightness, shortness of breath, wheezing. Please continue Xyzal, Flonase, Singulair as you have been taking them Use Benadryl as needed as you have been doing. We will perform pulmonary function testing at your next visit.  Depending on our progress we may refer you for formal allergy testing. Follow with Dr Lamonte Sakai next available with full PFT

## 2017-02-18 NOTE — Assessment & Plan Note (Signed)
Syndrome consistent with allergic asthma.  Based on his continued symptoms especially during the fall months I believe we need to restart scheduled Symbicort.  Check PFT for degree of obstruction.  He likely needs allergy workup at some point in the future  Please start using 2 puffs twice a day.  Remember to rinse and gargle after using this medication.  Keep albuterol available to use 2 puffs if needed for chest tightness, shortness of breath, wheezing. Please continue Xyzal, Flonase, Singulair as you have been taking them Use Benadryl as needed as you have been doing. We will perform pulmonary function testing at your next visit.  Depending on our progress we may refer you for formal allergy testing. Follow with Dr Lamonte Sakai next available with full PFT

## 2017-02-18 NOTE — Progress Notes (Signed)
Subjective:    Patient ID: Steve Jacobs, male    DOB: 07/06/1985, 32 y.o.   MRN: [id number]  HPI 32 year old never smoker with a history of probable childhood asthma, allergic rhinitis, Mnire's disease, hypertension, and asthma that was made in his teens. He has experienced exacerbations in the past that caused him to be hospitalized, once intubated. He was started on Symbicort and singulair in 2015, seemed to benefit. He is using Symbicort only prn. . Triggers include exercise, allergy season.   Overt the last 3 months he has had a lot more trouble.  Has had chest tightness, doesn't hear any wheeze, nasal mucous. Has had episodes of more cough, green mucous. He is on xyzal, flonase, singulair, benadryl prn. Allergies flare at least once a month. No reflux sx.   Review of Systems  Constitutional: Negative for fever and unexpected weight change.  HENT: Positive for congestion, sneezing, sore throat and trouble swallowing. Negative for dental problem, ear pain, nosebleeds, postnasal drip, rhinorrhea and sinus pressure.   Eyes: Negative for redness and itching.  Respiratory: Positive for cough and shortness of breath. Negative for chest tightness and wheezing.   Cardiovascular: Negative for palpitations and leg swelling.  Gastrointestinal: Negative for nausea and vomiting.  Genitourinary: Negative for dysuria.  Musculoskeletal: Negative for joint swelling.  Skin: Negative for rash.  Neurological: Negative for headaches.  Hematological: Does not bruise/bleed easily.  Psychiatric/Behavioral: Negative for dysphoric mood. The patient is not nervous/anxious.    Past Medical History:  Diagnosis Date  . Asthma   . Chicken pox   . Depression    Resolved  . Environmental and seasonal allergies   . Essential hypertension, benign 08/03/2013  . Hyperlipidemia   . Meniere's disease   . Thoracic outlet syndrome      Family History  Problem Relation Age of Onset  . Hyperlipidemia Father      Living  . Stroke Father   . Hypertension Father   . Alcohol abuse Mother        Living  . Diabetes Mellitus II Maternal Grandfather   . Hypertension Maternal Grandfather   . Heart failure Maternal Grandfather   . Kidney disease Maternal Grandfather   . Heart disease Maternal Grandmother   . Melanoma Paternal Grandmother   . Heart attack Paternal Grandfather   . Migraines Brother   . Healthy Brother        x2  . Drug abuse Sister        Died of Overdose     Social History   Socioeconomic History  . Marital status: Single    Spouse name: Not on file  . Number of children: Not on file  . Years of education: Not on file  . Highest education level: Not on file  Social Needs  . Financial resource strain: Not on file  . Food insecurity - worry: Not on file  . Food insecurity - inability: Not on file  . Transportation needs - medical: Not on file  . Transportation needs - non-medical: Not on file  Occupational History  . Not on file  Tobacco Use  . Smoking status: Never Smoker  . Smokeless tobacco: Never Used  Substance and Sexual Activity  . Alcohol use: Yes    Alcohol/week: 0.0 oz    Comment: 2-3 beers nightly  . Drug use: No  . Sexual activity: Yes    Birth control/protection: None    Comment: male - 1 partner  Other Topics Concern  .  Not on file  Social History Narrative  . Not on file  Works for [employer].  Wears mask reliably No known TB exposure, but at risk due to pt care.   Allergies  Allergen Reactions  . Belsomra [Suvorexant] Other (See Comments)    nightmares  . Percocet [Oxycodone-Acetaminophen] Nausea And Vomiting     Outpatient Medications Prior to Visit  Medication Sig Dispense Refill  . albuterol (VENTOLIN HFA) 108 (90 Base) MCG/ACT inhaler INHALE 2 PUFFS BY MOUTH EVERY 6 HOURS AS NEEDED FOR WHEEZING 18 g 0  . atorvastatin (LIPITOR) 20 MG tablet TAKE 1 TABLET BY MOUTH DAILY 30 tablet 5  . budesonide-formoterol (SYMBICORT) 80-4.5 MCG/ACT  inhaler Inhale 2 puffs into the lungs 2 (two) times daily. (Patient taking differently: Inhale 2 puffs into the lungs at bedtime. ) 1 Inhaler 6  . busPIRone (BUSPAR) 7.5 MG tablet TAKE 1 TABLET BY MOUTH TWICE DAILY 180 tablet 1  . diazepam (VALIUM) 5 MG tablet TAKE 1 TABLET BY MOUTH TWICE DAILY 60 tablet 1  . diphenhydrAMINE (BENADRYL) 25 MG tablet Take 25 mg by mouth every 6 (six) hours as needed for itching or allergies.    Marland Kitchen escitalopram (LEXAPRO) 10 MG tablet TAKE 1 TABLET BY MOUTH ONCE DAILY 30 tablet 5  . fenofibrate (TRICOR) 145 MG tablet TAKE 1 TABLET BY MOUTH DAILY. 90 tablet 1  . finasteride (PROPECIA) 1 MG tablet Take 1 tablet (1 mg total) by mouth daily. 90 tablet 0  . ibuprofen (ADVIL,MOTRIN) 200 MG tablet Take 400 mg by mouth every 8 (eight) hours as needed for mild pain or moderate pain. Reported on 05/31/2015    . levocetirizine (XYZAL) 5 MG tablet TAKE 1 TABLET (5 MG TOTAL) BY MOUTH EVERY EVENING. 30 tablet 5  . metoprolol succinate (TOPROL-XL) 25 MG 24 hr tablet TAKE 1 TABLET BY MOUTH DAILY. 30 tablet 5  . montelukast (SINGULAIR) 10 MG tablet TAKE 1 TABLET BY MOUTH AT BEDTIME. 90 tablet 1  . Multiple Vitamin (MULTIVITAMIN WITH MINERALS) TABS tablet Take 1 tablet by mouth daily.    . traZODone (DESYREL) 50 MG tablet Take 0.5 tablets (25 mg total) by mouth at bedtime as needed for sleep. 30 tablet 3  . triamterene-hydrochlorothiazide (DYAZIDE) 37.5-25 MG capsule Take 1 each (1 capsule total) by mouth daily. 90 capsule 1  . VASCEPA 1 g CAPS TAKE 2 CAPSULES BY MOUTH TWICE A DAY 360 capsule 1  . albuterol (VENTOLIN HFA) 108 (90 Base) MCG/ACT inhaler INHALE 2 PUFFS BY MOUTH EVERY 6 HOURS AS NEEDED FOR WHEEZING 18 g 2  . amoxicillin-clavulanate (AUGMENTIN) 875-125 MG tablet Take 1 tablet by mouth 2 (two) times daily. 14 tablet 0  . cyclobenzaprine (FLEXERIL) 10 MG tablet Take 1 tablet (10 mg total) by mouth 3 (three) times daily as needed for muscle spasms. 30 tablet 1  . doxycycline  (VIBRAMYCIN) 100 MG capsule Take 1 capsule (100 mg total) by mouth 2 (two) times daily. 20 capsule 0  . naproxen (NAPROSYN) 500 MG tablet Take 1 tablet (500 mg total) by mouth 2 (two) times daily with a meal. 30 tablet 0  . ranitidine (ZANTAC) 300 MG tablet Take 1 tablet (300 mg total) by mouth at bedtime. 30 tablet 0   No facility-administered medications prior to visit.         Objective:   Physical Exam  Vitals:   02/18/17 1516 02/18/17 1517  BP:  (!) 160/90  Pulse:  91  SpO2:  95%  Weight: 211 lb (  95.7 kg)   Height: 5\' 10"  (1.778 m)    Gen: Pleasant, well-nourished, in no distress,  normal affect  ENT: No lesions,  mouth clear,  oropharynx clear, no postnasal drip  Neck: No JVD, no stridor  Lungs: No use of accessory muscles, clear without rales or rhonchi  Cardiovascular: RRR, heart sounds normal, no murmur or gallops, no peripheral edema  Musculoskeletal: No deformities, no cyanosis or clubbing  Neuro: alert, non focal  Skin: Warm, no lesions or rash    Assessment & Plan:  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  Based on his continued symptoms especially during the fall months I believe we need to restart scheduled Symbicort.  Check PFT for degree of obstruction.  He likely needs allergy workup at some point in the future  Please start using 2 puffs twice a day.  Remember to rinse and gargle after using this medication.  Keep albuterol available to use 2 puffs if needed for chest tightness, shortness of breath, wheezing. Please continue Xyzal, Flonase, Singulair as you have been taking them Use Benadryl as needed as you have been doing. We will perform pulmonary function testing at your next visit.  Depending on our progress we may refer you for formal allergy testing. Follow with Dr Lamonte Sakai next available with full PFT  Baltazar Apo, MD, PhD 02/18/2017, 4:03 PM [facility] [phone] or if no answer [phone]

## 2017-02-27 ENCOUNTER — Encounter: Payer: Self-pay | Admitting: Emergency Medicine

## 2017-02-27 ENCOUNTER — Encounter: Payer: Self-pay | Admitting: Physician Assistant

## 2017-02-27 MED ORDER — BUDESONIDE-FORMOTEROL FUMARATE 80-4.5 MCG/ACT IN AERO: 2.0000 | INHALATION_SPRAY | Freq: Two times a day (BID) | RESPIRATORY_TRACT | 6 refills | Status: DC

## 2017-02-27 MED FILL — SYMBICORT 80-4.5 MCG INH: 80-4.5 | 30 days supply | Qty: 10 | Fill #0

## 2017-02-28 ENCOUNTER — Encounter: Payer: Self-pay | Admitting: Physician Assistant

## 2017-02-28 MED ORDER — TRAZODONE HCL 50 MG PO TABS: 25.0000 mg | ORAL_TABLET | Freq: Every evening | ORAL | 3 refills | Status: DC | PRN

## 2017-02-28 MED FILL — traZODone HCL 50 MG TABS: 50 | 60 days supply | Qty: 30 | Fill #0

## 2017-03-03 ENCOUNTER — Encounter: Payer: Self-pay | Admitting: Physician Assistant

## 2017-03-04 ENCOUNTER — Ambulatory Visit: Payer: No Typology Code available for payment source | Admitting: Physician Assistant

## 2017-03-04 DIAGNOSIS — Z0289 Encounter for other administrative examinations: Secondary | ICD-10-CM

## 2017-03-13 ENCOUNTER — Other Ambulatory Visit: Payer: Self-pay | Admitting: Physician Assistant

## 2017-03-13 MED FILL — ESCITALOPRAM 10 MG TABLET: 10 | 30 days supply | Qty: 30 | Fill #3

## 2017-03-13 MED FILL — LEVOCETIRIZINE 5 MG TABLET: 5 | 30 days supply | Qty: 30 | Fill #1

## 2017-03-14 ENCOUNTER — Emergency Department (HOSPITAL_COMMUNITY): Payer: No Typology Code available for payment source

## 2017-03-14 ENCOUNTER — Emergency Department (HOSPITAL_COMMUNITY)
Admission: EM | Admit: 2017-03-14 | Discharge: 2017-03-14 | Disposition: A | Discharge status: Home / Self Care | Payer: No Typology Code available for payment source | Attending: Emergency Medicine | Admitting: Emergency Medicine

## 2017-03-14 ENCOUNTER — Other Ambulatory Visit: Payer: Self-pay

## 2017-03-14 DIAGNOSIS — R079 Chest pain, unspecified: Secondary | ICD-10-CM | POA: Diagnosis present

## 2017-03-14 DIAGNOSIS — I10 Essential (primary) hypertension: Secondary | ICD-10-CM | POA: Diagnosis not present

## 2017-03-14 DIAGNOSIS — Z79899 Other long term (current) drug therapy: Secondary | ICD-10-CM | POA: Diagnosis not present

## 2017-03-14 DIAGNOSIS — J45909 Unspecified asthma, uncomplicated: Secondary | ICD-10-CM | POA: Diagnosis not present

## 2017-03-14 DIAGNOSIS — J302 Other seasonal allergic rhinitis: Secondary | ICD-10-CM | POA: Diagnosis not present

## 2017-03-14 DIAGNOSIS — I471 Supraventricular tachycardia: Secondary | ICD-10-CM | POA: Insufficient documentation

## 2017-03-14 LAB — BASIC METABOLIC PANEL
Anion gap: 14 (ref 5–15)
BUN: 18 mg/dL (ref 6–20)
CHLORIDE: 98 mmol/L — AB (ref 101–111)
CO2: 23 mmol/L (ref 22–32)
Calcium: 9.7 mg/dL (ref 8.9–10.3)
Creatinine, Ser: 1.06 mg/dL (ref 0.61–1.24)
GFR calc Af Amer: >60 mL/min (ref >60)
GFR calc non Af Amer: >60 mL/min (ref >60)
GLUCOSE: 99 mg/dL (ref 65–99)
POTASSIUM: 3.8 mmol/L (ref 3.5–5.1)
Sodium: 135 mmol/L (ref 135–145)

## 2017-03-14 LAB — CBC
HEMATOCRIT: 44.9 % (ref 39.0–52.0)
Hemoglobin: 15.1 g/dL (ref 13.0–17.0)
MCH: 28.2 pg (ref 26.0–34.0)
MCHC: 33.6 g/dL (ref 30.0–36.0)
MCV: 83.9 fL (ref 78.0–100.0)
Platelets: 228 10*3/uL (ref 150–400)
RBC: 5.35 MIL/uL (ref 4.22–5.81)
RDW: 13.4 % (ref 11.5–15.5)
WBC: 4.7 10*3/uL (ref 4.0–10.5)

## 2017-03-14 LAB — D-DIMER, QUANTITATIVE: D-Dimer, Quant: <0.27 ug/mL-FEU (ref 0.00–0.50)

## 2017-03-14 LAB — I-STAT TROPONIN, ED
Troponin i, poc: 0 ng/mL (ref 0.00–0.08)
Troponin i, poc: 0 ng/mL (ref 0.00–0.08)

## 2017-03-14 MED ORDER — METOPROLOL TARTRATE 25 MG PO TABS: 50.0000 mg | ORAL_TABLET | Freq: Once | ORAL | Status: DC

## 2017-03-14 MED ORDER — METOPROLOL SUCCINATE ER 50 MG PO TB24: 50.0000 mg | ORAL_TABLET | Freq: Every day | ORAL | 0 refills | Status: DC

## 2017-03-14 MED ORDER — SODIUM CHLORIDE 0.9 % IV BOLUS (SEPSIS)
1000.0000 mL | Freq: Once | INTRAVENOUS | Status: AC
  Administered 2017-03-14: 1000 mL via INTRAVENOUS

## 2017-03-14 MED FILL — diazePAM 5 MG TABS: 5 | 30 days supply | Qty: 60 | Fill #0

## 2017-03-14 NOTE — ED Notes (Signed)
Pt alert and oriented x4. Skin warm and dry. Respirations equal and unlabored. Sinus tachy to cardiac monitor. Provider aware.

## 2017-03-14 NOTE — Discharge Instructions (Signed)
Follow up with your PCP.  Take your medication as prescribed.  Return for worsening symptoms.  °

## 2017-03-14 NOTE — ED Triage Notes (Signed)
Patient c/o SOB after climbing stairs and chest pain 3/10 with radiation to left arm this am.

## 2017-03-14 NOTE — ED Provider Notes (Signed)
[facility] EMERGENCY DEPARTMENT Provider Note   CSN: [id number] Arrival date & time: 03/14/17  1532     History   Chief Complaint Chief Complaint  Patient presents with  . Chest Pain    HPI KALIEB FREELAND is a 32 y.o. male.  32 yo  M with a chief complaint of chest pain and shortness of breath.  The patient has had shortness of breath on exertion today.  He walked up about 4 flights of stairs and felt completely exhausted.  At that time he denied diaphoresis nausea or vomiting.  He had no chest pain at that time either.  The patient works on an ambulance.  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  This was at rest.  He denies shortness of breath with it.  Denied diaphoresis nausea or vomiting.  Denies lower extremity edema denies recent hospitalization denies testosterone use denies recent surgery denies prolonged travel denies history of PE or DVT.  Denies hemoptysis.  Patient has a history of hypertension and hyperlipidemia.  No family history of MI.  No history of smoking.  Denies diabetes.   The history is provided by the patient.  Chest Pain   This is a new problem. The current episode started less than 1 hour ago. The problem occurs constantly. The problem has not changed since onset.The pain is associated with exertion. The pain is present in the lateral region. The pain is at a severity of 5/10. The pain is moderate. The quality of the pain is described as brief. The pain does not radiate. Duration of episode(s) is 5 hours. Associated symptoms include shortness of breath. Pertinent negatives include no abdominal pain, no fever, no headaches, no palpitations and no vomiting.  His past medical history is significant for hyperlipidemia and hypertension.  Pertinent negatives for past medical history include no diabetes, no DVT, no MI and no PE.  Pertinent negatives for family medical  history include: no early MI.    Past Medical History:  Diagnosis Date  . Asthma   . Chicken pox   . Depression    Resolved  . Environmental and seasonal allergies   . Essential hypertension, benign 08/03/2013  . Hyperlipidemia   . Meniere's disease   . Thoracic outlet syndrome     Patient Active Problem List   Diagnosis Date Noted  . Acute bacterial sinusitis 02/02/2017  . 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 Routine screening for STI (sexually transmitted infection) 06/07/2016  . SVT (supraventricular tachycardia) ([place]) 10/08/2014  . Depression 10/08/2014  . Atypical nevi 06/22/2014  . Elevated triglycerides with high cholesterol 09/06/2013  . Anxiety state 08/03/2013  . Essential hypertension, benign 08/03/2013  . Environmental allergies 03/23/2013  . Visit for preventive health examination 03/23/2013  . Meniere disease 03/23/2013  . Intrinsic asthma     Past Surgical History:  Procedure Laterality Date  . Nevus Biopsy         Home Medications    Prior to Admission medications   Medication Sig Start Date End Date Taking? Authorizing Provider  albuterol (VENTOLIN HFA) 108 (90 Base) MCG/ACT inhaler INHALE 2 PUFFS BY MOUTH EVERY 6 HOURS AS NEEDED FOR WHEEZING 12/15/16  Yes Law, Alexandra M, PA-C  atorvastatin (LIPITOR) 20 MG tablet TAKE 1 TABLET BY MOUTH DAILY 01/08/17  Yes Brunetta Jeans, PA-C  budesonide-formoterol (SYMBICORT) 80-4.5 MCG/ACT inhaler Inhale 2 puffs  into the lungs 2 (two) times daily. 02/27/17  Yes Collene Gobble, MD  busPIRone (BUSPAR) 7.5 MG tablet TAKE 1 TABLET BY MOUTH TWICE DAILY 10/07/16  Yes Brunetta Jeans, PA-C  diazepam (VALIUM) 5 MG tablet TAKE 1 TABLET BY MOUTH TWICE DAILY 03/13/17  Yes Brunetta Jeans, PA-C  diphenhydrAMINE (BENADRYL) 25 MG tablet Take 25 mg by mouth every 6 (six) hours as needed for itching or allergies.   Yes [provider]  escitalopram (LEXAPRO) 10 MG tablet TAKE 1 TABLET BY MOUTH ONCE DAILY 12/09/16  Yes  Brunetta Jeans, PA-C  fenofibrate (TRICOR) 145 MG tablet TAKE 1 TABLET BY MOUTH DAILY. 01/31/17  Yes Brunetta Jeans, PA-C  finasteride (PROPECIA) 1 MG tablet Take 1 tablet (1 mg total) by mouth daily. 02/07/17  Yes Brunetta Jeans, PA-C  ibuprofen (ADVIL,MOTRIN) 200 MG tablet Take 400 mg by mouth every 8 (eight) hours as needed for mild pain or moderate pain. Reported on 05/31/2015   Yes [provider]  levocetirizine (XYZAL) 5 MG tablet TAKE 1 TABLET (5 MG TOTAL) BY MOUTH EVERY EVENING. 02/10/17  Yes Brunetta Jeans, PA-C  montelukast (SINGULAIR) 10 MG tablet TAKE 1 TABLET BY MOUTH AT BEDTIME. 10/28/16  Yes Brunetta Jeans, PA-C  Multiple Vitamin (MULTIVITAMIN WITH MINERALS) TABS tablet Take 1 tablet by mouth daily.   Yes [provider]  thiamine (VITAMIN B-1) 100 MG tablet Take 100 mg by mouth daily.   Yes [provider]  traZODone (DESYREL) 50 MG tablet Take 0.5 tablets (25 mg total) by mouth at bedtime as needed for sleep. 02/28/17  Yes Brunetta Jeans, PA-C  triamterene-hydrochlorothiazide (DYAZIDE) 37.5-25 MG capsule Take 1 each (1 capsule total) by mouth daily. 11/12/16  Yes Brunetta Jeans, PA-C  VASCEPA 1 g CAPS TAKE 2 CAPSULES BY MOUTH TWICE A DAY 07/15/16  Yes Brunetta Jeans, PA-C  metoprolol succinate (TOPROL-XL) 50 MG 24 hr tablet Take 1 tablet (50 mg total) by mouth daily. 03/14/17   Deno Etienne, DO    Family History Family History  Problem Relation Age of Onset  . Hyperlipidemia Father        Living  . Stroke Father   . Hypertension Father   . Alcohol abuse Mother        Living  . Diabetes Mellitus II Maternal Grandfather   . Hypertension Maternal Grandfather   . Heart failure Maternal Grandfather   . Kidney disease Maternal Grandfather   . Heart disease Maternal Grandmother   . Melanoma Paternal Grandmother   . Heart attack Paternal Grandfather   . Migraines Brother   . Healthy Brother        x2  . Drug abuse Sister        Died  of Overdose    Social History Social History   Tobacco Use  . Smoking status: Never Smoker  . Smokeless tobacco: Never Used  Substance Use Topics  . Alcohol use: Yes    Alcohol/week: 0.0 oz    Comment: 2-3 beers nightly  . Drug use: No     Allergies   Belsomra [suvorexant] and Percocet [oxycodone-acetaminophen]   Review of Systems Review of Systems  Constitutional: Positive for fatigue. Negative for chills and fever.  HENT: Negative for congestion and facial swelling.   Eyes: Negative for discharge and visual disturbance.  Respiratory: Positive for shortness of breath.   Cardiovascular: Positive for chest pain. Negative for palpitations.  Gastrointestinal: Negative for abdominal pain, diarrhea  and vomiting.  Musculoskeletal: Negative for arthralgias and myalgias.  Skin: Negative for color change and rash.  Neurological: Negative for tremors, syncope and headaches.  Psychiatric/Behavioral: Negative for confusion and dysphoric mood.     Physical Exam Updated Vital Signs BP (!) 150/102 (BP Location: Left Arm)   Pulse (!) 118   Temp 98.3 F (36.8 C)   Resp 18   Ht 5\' 10"  (1.778 m)   Wt 93 kg (205 lb)   SpO2 100%   BMI 29.41 kg/m   Physical Exam  Constitutional: He is oriented to person, place, and time. He appears well-developed and well-nourished.  HENT:  Head: Normocephalic and atraumatic.  Eyes: EOM are normal. Pupils are equal, round, and reactive to light.  Neck: Normal range of motion. Neck supple. No JVD present.  Cardiovascular: Normal rate and regular rhythm. Exam reveals no gallop and no friction rub.  No murmur heard. Pulmonary/Chest: No respiratory distress. He has no wheezes.  Abdominal: He exhibits no distension. There is no rebound and no guarding.  Musculoskeletal: Normal range of motion.  Neurological: He is alert and oriented to person, place, and time.  Skin: No rash noted. No pallor.  Psychiatric: He has a normal mood and affect. His  behavior is normal.  Nursing note and vitals reviewed.    ED Treatments / Results  Labs (all labs ordered are listed, but only abnormal results are displayed) Labs Reviewed  BASIC METABOLIC PANEL - Abnormal; Notable for the following components:      Result Value   Chloride 98 (*)    All other components within normal limits  CBC  D-DIMER, QUANTITATIVE (NOT AT [facility])  I-STAT TROPONIN, ED  I-STAT TROPONIN, ED    EKG  EKG Interpretation  Date/Time:  Friday March 14 2017 15:34:30 EST Ventricular Rate:  84 PR Interval:    QRS Duration: 117 QT Interval:  435 QTC Calculation: 515 R Axis:   -19 Text Interpretation:  Sinus rhythm Incomplete right bundle branch block Since last tracing rate slower Otherwise no significant change Confirmed by Deno Etienne [phone]) on 03/14/2017 4:27:28 PM       Radiology Dg Chest 2 View  Result Date: 03/14/2017 CLINICAL DATA:  Chest pain EXAM: CHEST  2 VIEW COMPARISON:  12/15/2016 FINDINGS: The heart size and mediastinal contours are within normal limits. Both lungs are clear. The visualized skeletal structures are unremarkable. IMPRESSION: No active cardiopulmonary disease. Electronically Signed   By: Kathreen Devoid   On: 03/14/2017 16:38    Procedures Procedures (including critical care time)  Medications Ordered in ED Medications  metoprolol tartrate (LOPRESSOR) tablet 50 mg (50 mg Oral Not Given 03/14/17 2042)  sodium chloride 0.9 % bolus 1,000 mL (0 mLs Intravenous Stopped 03/14/17 2039)     Initial Impression / Assessment and Plan / ED Course  I have reviewed the triage vital signs and the nursing notes.  Pertinent labs & imaging results that were available during my care of the patient were reviewed by me and considered in my medical decision making (see chart for details).     32 yo M with a chief complaint of chest pain shortness of breath.  This is concerning because it occurred on exertion.  Obtain a d-dimer as is more likely in this age  group.    D-dimer was negative.  Delta troponin was negative.  While the patient was waiting in the ED he went into a fast regular rhythm.  EKG captured SVT with a rate of  170.  Resolved on its own.  We will increase the patient's beta-blocker.  Have him follow-up with cardiology.  CRITICAL CARE Performed by: Cecilio Asper   Total critical care time: 35 minutes  Critical care time was exclusive of separately billable procedures and treating other patients.  Critical care was necessary to treat or prevent imminent or life-threatening deterioration.  Critical care was time spent personally by me on the following activities: development of treatment plan with patient and/or surrogate as well as nursing, discussions with consultants, evaluation of patient's response to treatment, examination of patient, obtaining history from patient or surrogate, ordering and performing treatments and interventions, ordering and review of laboratory studies, ordering and review of radiographic studies, pulse oximetry and re-evaluation of patient's condition.  11:46 PM:  I have discussed the diagnosis/risks/treatment options with the patient and believe the pt to be eligible for discharge home to follow-up with PCP, Cards. We also discussed returning to the ED immediately if new or worsening sx occur. We discussed the sx which are most concerning (e.g., sudden worsening pain, fever, inability to tolerate by mouth) that necessitate immediate return. Medications administered to the patient during their visit and any new prescriptions provided to the patient are listed below.  Medications given during this visit Medications  metoprolol tartrate (LOPRESSOR) tablet 50 mg (50 mg Oral Not Given 03/14/17 2042)  sodium chloride 0.9 % bolus 1,000 mL (0 mLs Intravenous Stopped 03/14/17 2039)     The patient appears reasonably screen and/or stabilized for discharge and I doubt any other medical condition or other [facility]  requiring further screening, evaluation, or treatment in the ED at this time prior to discharge.   Final Clinical Impressions(s) / ED Diagnoses   Final diagnoses:  Nonspecific chest pain  SVT (supraventricular tachycardia) [facility])    ED Discharge Orders        Ordered    metoprolol succinate (TOPROL-XL) 50 MG 24 hr tablet  Daily     03/14/17 2033       Deno Etienne, DO 03/14/17 2346

## 2017-03-19 ENCOUNTER — Ambulatory Visit: Payer: No Typology Code available for payment source | Admitting: Emergency Medicine

## 2017-04-10 ENCOUNTER — Other Ambulatory Visit: Payer: Self-pay | Admitting: Physician Assistant

## 2017-04-10 ENCOUNTER — Encounter: Payer: Self-pay | Admitting: Physician Assistant

## 2017-04-10 MED FILL — ESCITALOPRAM 10 MG TABLET: 10 | 30 days supply | Qty: 30 | Fill #4

## 2017-04-10 NOTE — Telephone Encounter (Signed)
Last OV 01/31/2017, No Future appointment scheduled  Buspar last filled 10/07/2016, 180 tablets with 1 refill  Valium last filled 03/13/2017, 60 tablets with 0 refills

## 2017-04-11 ENCOUNTER — Other Ambulatory Visit: Payer: Self-pay | Admitting: Physician Assistant

## 2017-04-11 MED ORDER — TRAZODONE HCL 50 MG PO TABS: 50.0000 mg | ORAL_TABLET | Freq: Every evening | ORAL | 3 refills | Status: DC | PRN

## 2017-04-11 MED FILL — diazePAM 5 MG TABS: 5 | 30 days supply | Qty: 60 | Fill #0

## 2017-04-11 MED FILL — traZODone HCL 50 MG TABS: 50 | 30 days supply | Qty: 30 | Fill #0

## 2017-04-14 MED FILL — LEVOCETIRIZINE 5 MG TABLET: 5 | 30 days supply | Qty: 30 | Fill #2

## 2017-04-16 ENCOUNTER — Ambulatory Visit (INDEPENDENT_AMBULATORY_CARE_PROVIDER_SITE_OTHER): Payer: No Typology Code available for payment source | Admitting: Emergency Medicine

## 2017-04-16 ENCOUNTER — Encounter: Payer: Self-pay | Admitting: Emergency Medicine

## 2017-04-16 ENCOUNTER — Ambulatory Visit: Payer: No Typology Code available for payment source | Admitting: Emergency Medicine

## 2017-04-16 VITALS — BP 126/82 | HR 114 | Ht 68.0 in | Wt 208.0 lb

## 2017-04-16 DIAGNOSIS — J45909 Unspecified asthma, uncomplicated: Secondary | ICD-10-CM | POA: Diagnosis not present

## 2017-04-16 LAB — PULMONARY FUNCTION TEST
DL/VA % pred: 124 %
DL/VA: 5.66 ml/min/mmHg/L
DLCO cor % pred: 124 %
DLCO cor: 37.04 ml/min/mmHg
DLCO unc % pred: 126 %
DLCO unc: 37.55 ml/min/mmHg
FEF 25-75 Post: 7.26 L/sec
FEF 25-75 Pre: 5.73 L/sec
FEF2575-%Change-Post: 26 %
FEF2575-%Pred-Post: 172 %
FEF2575-%Pred-Pre: 136 %
FEV1-%Change-Post: 5 %
FEV1-%Pred-Post: 116 %
FEV1-%Pred-Pre: 109 %
FEV1-Post: 4.85 L
FEV1-Pre: 4.58 L
FEV1FVC-%Change-Post: 3 %
FEV1FVC-%Pred-Pre: 106 %
FEV6-%Change-Post: 2 %
FEV6-%Pred-Post: 106 %
FEV6-%Pred-Pre: 104 %
FEV6-Post: 5.35 L
FEV6-Pre: 5.25 L
FEV6FVC-%Pred-Post: 102 %
FEV6FVC-%Pred-Pre: 102 %
FVC-%Change-Post: 2 %
FVC-%Pred-Post: 104 %
FVC-%Pred-Pre: 102 %
FVC-Post: 5.35 L
FVC-Pre: 5.25 L
Post FEV1/FVC ratio: 91 %
Post FEV6/FVC ratio: 100 %
Pre FEV1/FVC ratio: 87 %
Pre FEV6/FVC Ratio: 100 %
RV % pred: 74 %
RV: 1.15 L
TLC % pred: 100 %
TLC: 6.53 L

## 2017-04-16 NOTE — Patient Instructions (Addendum)
We will perform a methacholine spirometry test Continue your Symbicort but be sure to stop this at least 5 days prior to the methacholine testing. Keep albuterol available to use 2 puffs if needed for shortness of breath, chest tightness. Continue Singulair as you have been taking it. Follow with Dr. Lamonte Sakai next available after your testing to review the results together.

## 2017-04-16 NOTE — Progress Notes (Signed)
Patient completed full PFT today. 

## 2017-04-16 NOTE — Progress Notes (Signed)
Subjective:    Patient ID: Steve Jacobs, male    DOB: 1985/12/01, 32 y.o.   MRN: [id number]  Asthma  He complains of cough and shortness of breath. There is no wheezing. Associated symptoms include sneezing, a sore throat and trouble swallowing. Pertinent negatives include no ear pain, fever, headaches, postnasal drip or rhinorrhea. His past medical history is significant for asthma.   32 year old never smoker with a history of probable childhood asthma, allergic rhinitis, Mnire's disease, hypertension, and asthma that was made in his teens. He has experienced exacerbations in the past that caused him to be hospitalized, once intubated. He was started on Symbicort and singulair in 2015, seemed to benefit. He is using Symbicort only prn. . Triggers include exercise, allergy season.   Overt the last 3 months he has had a lot more trouble.  Has had chest tightness, doesn't hear any wheeze, nasal mucous. Has had episodes of more cough, green mucous. He is on xyzal, flonase, singulair, benadryl prn. Allergies flare at least once a month. No reflux sx.   ROV 04/16/17 --follow-up visit for patient with history of asthma.  I met him in the setting of worsening daily symptoms that included chest tightness and some increased allergy symptoms.  He was not on any maintenance bronchodilators at the time.  At his last visit I started Symbicort to see if he would benefit. He feels that he has benefited, less chest tightness, less albuterol use.  He underwent pulmonary function testing today that I have reviewed.  This shows normal airflows without a formal bronchodilator response but with an improvement in his FEF 25-75% of 26%.  His total lung capacity was normal with a decreased RV, his diffusion capacity is elevated.    Review of Systems  Constitutional: Negative for fever and unexpected weight change.  HENT: Positive for congestion, sneezing, sore throat and trouble swallowing. Negative for dental problem,  ear pain, nosebleeds, postnasal drip, rhinorrhea and sinus pressure.   Eyes: Negative for redness and itching.  Respiratory: Positive for cough and shortness of breath. Negative for chest tightness and wheezing.   Cardiovascular: Negative for palpitations and leg swelling.  Gastrointestinal: Negative for nausea and vomiting.  Genitourinary: Negative for dysuria.  Musculoskeletal: Negative for joint swelling.  Skin: Negative for rash.  Neurological: Negative for headaches.  Hematological: Does not bruise/bleed easily.  Psychiatric/Behavioral: Negative for dysphoric mood. The patient is not nervous/anxious.    Past Medical History:  Diagnosis Date  . Asthma   . Chicken pox   . Depression    Resolved  . Environmental and seasonal allergies   . Essential hypertension, benign 08/03/2013  . Hyperlipidemia   . Meniere's disease   . Thoracic outlet syndrome      Family History  Problem Relation Age of Onset  . Hyperlipidemia Father        Living  . Stroke Father   . Hypertension Father   . Alcohol abuse Mother        Living  . Diabetes Mellitus II Maternal Grandfather   . Hypertension Maternal Grandfather   . Heart failure Maternal Grandfather   . Kidney disease Maternal Grandfather   . Heart disease Maternal Grandmother   . Melanoma Paternal Grandmother   . Heart attack Paternal Grandfather   . Migraines Brother   . Healthy Brother        x2  . Drug abuse Sister        Died of Overdose     Social  History   Socioeconomic History  . Marital status: Single    Spouse name: Not on file  . Number of children: Not on file  . Years of education: Not on file  . Highest education level: Not on file  Occupational History  . Not on file  Social Needs  . Financial resource strain: Not on file  . Food insecurity:    Worry: Not on file    Inability: Not on file  . Transportation needs:    Medical: Not on file    Non-medical: Not on file  Tobacco Use  . Smoking status: Never  Smoker  . Smokeless tobacco: Never Used  Substance and Sexual Activity  . Alcohol use: Yes    Alcohol/week: 0.0 oz    Comment: 2-3 beers nightly  . Drug use: No  . Sexual activity: Yes    Birth control/protection: None    Comment: male - 1 partner  Lifestyle  . Physical activity:    Days per week: Not on file    Minutes per session: Not on file  . Stress: Not on file  Relationships  . Social connections:    Talks on phone: Not on file    Gets together: Not on file    Attends religious service: Not on file    Active member of club or organization: Not on file    Attends meetings of clubs or organizations: Not on file    Relationship status: Not on file  . Intimate partner violence:    Fear of current or ex partner: Not on file    Emotionally abused: Not on file    Physically abused: Not on file    Forced sexual activity: Not on file  Other Topics Concern  . Not on file  Social History Narrative  . Not on file  Works for [employer].  Wears mask reliably No known TB exposure, but at risk due to pt care.   Allergies  Allergen Reactions  . Belsomra [Suvorexant] Other (See Comments)    nightmares  . Percocet [Oxycodone-Acetaminophen] Nausea And Vomiting     Outpatient Medications Prior to Visit  Medication Sig Dispense Refill  . albuterol (VENTOLIN HFA) 108 (90 Base) MCG/ACT inhaler INHALE 2 PUFFS BY MOUTH EVERY 6 HOURS AS NEEDED FOR WHEEZING 18 g 0  . atorvastatin (LIPITOR) 20 MG tablet TAKE 1 TABLET BY MOUTH DAILY 30 tablet 5  . budesonide-formoterol (SYMBICORT) 80-4.5 MCG/ACT inhaler Inhale 2 puffs into the lungs 2 (two) times daily. 1 Inhaler 6  . busPIRone (BUSPAR) 7.5 MG tablet TAKE 1 TABLET BY MOUTH TWICE DAILY 180 tablet 1  . diazepam (VALIUM) 5 MG tablet TAKE 1 TABLET BY MOUTH TWICE DAILY 60 tablet 0  . diphenhydrAMINE (BENADRYL) 25 MG tablet Take 25 mg by mouth every 6 (six) hours as needed for itching or allergies.    Marland Kitchen escitalopram (LEXAPRO) 10 MG tablet TAKE 1  TABLET BY MOUTH ONCE DAILY 30 tablet 5  . fenofibrate (TRICOR) 145 MG tablet TAKE 1 TABLET BY MOUTH DAILY. 90 tablet 1  . finasteride (PROPECIA) 1 MG tablet Take 1 tablet (1 mg total) by mouth daily. 90 tablet 0  . ibuprofen (ADVIL,MOTRIN) 200 MG tablet Take 400 mg by mouth every 8 (eight) hours as needed for mild pain or moderate pain. Reported on 05/31/2015    . levocetirizine (XYZAL) 5 MG tablet TAKE 1 TABLET (5 MG TOTAL) BY MOUTH EVERY EVENING. 30 tablet 5  . metoprolol succinate (TOPROL-XL) 50 MG 24  hr tablet Take 1 tablet (50 mg total) by mouth daily. 30 tablet 0  . montelukast (SINGULAIR) 10 MG tablet TAKE 1 TABLET BY MOUTH AT BEDTIME. 90 tablet 1  . Multiple Vitamin (MULTIVITAMIN WITH MINERALS) TABS tablet Take 1 tablet by mouth daily.    [place] Kitchen thiamine (VITAMIN B-1) 100 MG tablet Take 100 mg by mouth daily.    . traZODone (DESYREL) 50 MG tablet Take 1 tablet (50 mg total) by mouth at bedtime as needed for sleep. 30 tablet 3  . triamterene-hydrochlorothiazide (DYAZIDE) 37.5-25 MG capsule Take 1 each (1 capsule total) by mouth daily. 90 capsule 1  . VASCEPA 1 g CAPS TAKE 2 CAPSULES BY MOUTH TWICE A DAY 360 capsule 1   No facility-administered medications prior to visit.         Objective:   Physical Exam  Vitals:   04/16/17 1609 04/16/17 1613  BP:  126/82  Pulse:  (!) 114  SpO2:  96%  Weight: 208 lb (94.3 kg)   Height: 5' 8"  (1.727 m)    Gen: Pleasant, well-nourished, in no distress,  normal affect  ENT: No lesions,  mouth clear,  oropharynx clear, no postnasal drip  Neck: No JVD, no stridor  Lungs: No use of accessory muscles, clear without rales or rhonchi  Cardiovascular: RRR, heart sounds normal, no murmur or gallops, no peripheral edema  Musculoskeletal: No deformities, no cyanosis or clubbing  Neuro: alert, non focal  Skin: Warm, no lesions or rash    Assessment & Plan:  Intrinsic asthma Circumstantial evidence for asthma based on a change in his FEF 25-75% but  overall his airflows are normal.  In order to truly prove the diagnosis (which he is very interested in) I believe he needs a methacholine challenge.  We will continue the Symbicort for now.  He will stop  5 days before the methacholine testing.  Continue Singulair, continue albuterol as needed.  Baltazar Apo, MD, PhD 04/16/2017, 4:46 PM [facility] [phone] or if no answer [phone]

## 2017-04-16 NOTE — Assessment & Plan Note (Signed)
Circumstantial evidence for asthma based on a change in his FEF 25-75% but overall his airflows are normal.  In order to truly prove the diagnosis (which he is very interested in) I believe he needs a methacholine challenge.  We will continue the Symbicort for now.  He will stop  5 days before the methacholine testing.  Continue Singulair, continue albuterol as needed.

## 2017-04-22 ENCOUNTER — Telehealth: Payer: Self-pay | Admitting: Physician Assistant

## 2017-04-22 ENCOUNTER — Encounter: Payer: Self-pay | Admitting: Physician Assistant

## 2017-04-22 DIAGNOSIS — Z0279 Encounter for issue of other medical certificate: Secondary | ICD-10-CM

## 2017-04-22 NOTE — Telephone Encounter (Signed)
Paperwork placed in provider bin for completion

## 2017-04-22 NOTE — Telephone Encounter (Signed)
Received FMLA "Certification of Health Care Provider" paperwork, via fax. Placed in front bin with charge sheet.

## 2017-04-22 NOTE — Telephone Encounter (Signed)
FMLA in process. There was no notation for reason of FMLA -- renewal versus new need. MyChart message sent to patient.

## 2017-04-23 NOTE — Telephone Encounter (Signed)
FMLA completed. Faxed in.

## 2017-04-30 ENCOUNTER — Encounter: Payer: Self-pay | Admitting: Physician Assistant

## 2017-05-01 ENCOUNTER — Encounter (HOSPITAL_COMMUNITY): Payer: No Typology Code available for payment source

## 2017-05-12 MED FILL — VENTOLIN HFA 90 MCG INHALER: 108 (90 BAS | 25 days supply | Qty: 18 | Fill #1

## 2017-05-13 ENCOUNTER — Ambulatory Visit: Payer: No Typology Code available for payment source | Admitting: Emergency Medicine

## 2017-05-14 ENCOUNTER — Other Ambulatory Visit: Payer: Self-pay | Admitting: Physician Assistant

## 2017-05-14 MED FILL — diazePAM 5 MG TABS: 5 | 30 days supply | Qty: 60 | Fill #0

## 2017-05-14 MED FILL — ATORVASTATIN 20 MG TABLET: 20 | 90 days supply | Qty: 90 | Fill #1

## 2017-05-14 MED FILL — traZODone HCL 50 MG TABS: 50 | 30 days supply | Qty: 30 | Fill #1

## 2017-05-14 MED FILL — MONTELUKAST SOD 10 MG TAB: 10 | 90 days supply | Qty: 90 | Fill #0

## 2017-05-14 MED FILL — ESCITALOPRAM 10 MG TABLET: 10 | 30 days supply | Qty: 30 | Fill #5

## 2017-05-14 MED FILL — FINASTERIDE 1 MG TABLET: 1 | 90 days supply | Qty: 90 | Fill #0

## 2017-05-14 MED FILL — LEVOCETIRIZINE 5 MG TABLET: 5 | 30 days supply | Qty: 30 | Fill #3

## 2017-05-14 MED FILL — FENOFIBRATE 145 MG TAB: 145 | 90 days supply | Qty: 90 | Fill #1

## 2017-05-14 NOTE — Telephone Encounter (Signed)
Diazepam last refilled on 04/11/17 #60 CSC: 01/15/17 UDS:07/10/16  Please advise

## 2017-05-26 ENCOUNTER — Other Ambulatory Visit: Payer: Self-pay | Admitting: Physician Assistant

## 2017-05-26 MED FILL — TRIAMTERENE/HCTZ 37.5/25 CP: 37.5-25 | 90 days supply | Qty: 90 | Fill #1

## 2017-05-27 MED FILL — METOPROLOL SUCCINATE ER 25: 25 | 30 days supply | Qty: 30 | Fill #0

## 2017-05-27 NOTE — Telephone Encounter (Signed)
Ok to give 31. Needs follow-up for further refills.

## 2017-05-27 NOTE — Telephone Encounter (Signed)
Last OV 12/18/16, No future OV  Last filled by DO Deno Etienne (ED visit) on 03/14/17, # 30 with 0 refills  Please advise if okay to fill

## 2017-05-28 MED FILL — busPIRone HCL 7.5 MG TABS: 7.5 | 90 days supply | Qty: 180 | Fill #0

## 2017-06-02 ENCOUNTER — Ambulatory Visit: Payer: No Typology Code available for payment source | Admitting: Physician Assistant

## 2017-06-13 ENCOUNTER — Telehealth: Payer: No Typology Code available for payment source | Admitting: Family

## 2017-06-13 DIAGNOSIS — Z719 Counseling, unspecified: Secondary | ICD-10-CM

## 2017-06-13 NOTE — Progress Notes (Signed)
Thank you for the details you included in the comment boxes. Those details are very helpful in determining the best course of treatment for you and help [place] to provide the best care. I contacted Elyn Aquas, PA-C, your provider on our [facility] team treating your conditions and he advised that you can make an appointment with him. This is not a condition treated through the E-Visit program and requires a higher level of care than what we are able to provide through a digital encounter.   Best,  Guadelupe Sabin, DNP, FNP-BC [place] Team      Based on what you shared with me it looks like you have a serious condition that should be evaluated in a face to face office visit.  NOTE: If you entered your credit card information for this eVisit, you will not be charged. You may see a "hold" on your card for the $30 but that hold will drop off and you will not have a charge processed.  If you are having a true medical emergency please call 911.  If you need an urgent face to face visit, [facility] has four urgent care centers for your convenience.  If you need care fast and have a high deductible or no insurance consider:   [email or web address] to reserve your spot online an avoid wait times  [facility] [address], [address] [place], [place] [address] 8 am to 8 pm Monday-Friday 10 am to 4 pm Saturday-Sunday *Across the street from [employer]  [place], [address] 8 am to 5 pm Monday-Friday * In the [facility] on the [address]   The following sites will take your  insurance:  . [facility] [place] a Provider at this Location  [address] [place], [place] [address] . 10 am to 8 pm Monday-Friday . 12 pm to 8 pm Saturday-Sunday   . [facility] [place] a Provider at this  Location  [address], [place], [place] [address] . 8 am to 8 pm Monday-Friday . 9 am to 6 pm Saturday . 11 am to 6 pm Sunday   . [facility] at [place] Get Driving Directions  [address] [place],  [address] . 8 am to 8 pm Monday-Friday . 8 am to 4 pm Saturday-Sunday   Your e-visit answers were reviewed by a board certified advanced clinical practitioner to complete your personal care plan.  Thank you for using e-Visits.

## 2017-06-17 ENCOUNTER — Encounter: Payer: Self-pay | Admitting: Physician Assistant

## 2017-06-18 ENCOUNTER — Other Ambulatory Visit: Payer: Self-pay | Admitting: Emergency Medicine

## 2017-06-18 ENCOUNTER — Other Ambulatory Visit: Payer: Self-pay | Admitting: Physician Assistant

## 2017-06-18 MED FILL — LEVOCETIRIZINE 5 MG TABLET: 5 | 30 days supply | Qty: 30 | Fill #4

## 2017-06-18 MED FILL — ESCITALOPRAM 10 MG TABLET: 10 | 30 days supply | Qty: 30 | Fill #0

## 2017-06-19 ENCOUNTER — Encounter: Payer: Self-pay | Admitting: Physician Assistant

## 2017-06-19 ENCOUNTER — Ambulatory Visit: Payer: No Typology Code available for payment source | Admitting: Physician Assistant

## 2017-06-19 ENCOUNTER — Other Ambulatory Visit: Payer: Self-pay

## 2017-06-19 ENCOUNTER — Ambulatory Visit (INDEPENDENT_AMBULATORY_CARE_PROVIDER_SITE_OTHER): Payer: No Typology Code available for payment source | Admitting: Physician Assistant

## 2017-06-19 VITALS — BP 112/84 | HR 74 | Temp 98.0°F | Resp 16 | Ht 69.75 in | Wt 211.0 lb

## 2017-06-19 DIAGNOSIS — Z0000 Encounter for general adult medical examination without abnormal findings: Secondary | ICD-10-CM | POA: Diagnosis not present

## 2017-06-19 DIAGNOSIS — J45909 Unspecified asthma, uncomplicated: Secondary | ICD-10-CM

## 2017-06-19 DIAGNOSIS — I471 Supraventricular tachycardia: Secondary | ICD-10-CM

## 2017-06-19 DIAGNOSIS — Z23 Encounter for immunization: Secondary | ICD-10-CM

## 2017-06-19 DIAGNOSIS — K219 Gastro-esophageal reflux disease without esophagitis: Secondary | ICD-10-CM | POA: Insufficient documentation

## 2017-06-19 DIAGNOSIS — D229 Melanocytic nevi, unspecified: Secondary | ICD-10-CM

## 2017-06-19 DIAGNOSIS — F5101 Primary insomnia: Secondary | ICD-10-CM

## 2017-06-19 DIAGNOSIS — H8109 Meniere's disease, unspecified ear: Secondary | ICD-10-CM

## 2017-06-19 DIAGNOSIS — I10 Essential (primary) hypertension: Secondary | ICD-10-CM | POA: Diagnosis not present

## 2017-06-19 LAB — COMPREHENSIVE METABOLIC PANEL
ALT: 18 U/L (ref 0–53)
AST: 20 U/L (ref 0–37)
Albumin: 5.3 g/dL — ABNORMAL HIGH (ref 3.5–5.2)
Alkaline Phosphatase: 42 U/L (ref 39–117)
BILIRUBIN TOTAL: 0.8 mg/dL (ref 0.2–1.2)
BUN: 19 mg/dL (ref 6–23)
CALCIUM: 10.3 mg/dL (ref 8.4–10.5)
CHLORIDE: 98 meq/L (ref 96–112)
CO2: 28 meq/L (ref 19–32)
Creatinine, Ser: 1.06 mg/dL (ref 0.40–1.50)
GFR: 86.1 mL/min (ref >60.00)
Glucose, Bld: 110 mg/dL — ABNORMAL HIGH (ref 70–99)
Potassium: 3.9 mEq/L (ref 3.5–5.1)
Sodium: 138 mEq/L (ref 135–145)
Total Protein: 7.8 g/dL (ref 6.0–8.3)

## 2017-06-19 LAB — CBC WITH DIFFERENTIAL/PLATELET
Basophils Absolute: 0.1 10*3/uL (ref 0.0–0.1)
Basophils Relative: 2.8 % (ref 0.0–3.0)
Eosinophils Absolute: 0.2 10*3/uL (ref 0.0–0.7)
Eosinophils Relative: 4.1 % (ref 0.0–5.0)
HEMATOCRIT: 44.9 % (ref 39.0–52.0)
Hemoglobin: 15.5 g/dL (ref 13.0–17.0)
LYMPHS ABS: 1.4 10*3/uL (ref 0.7–4.0)
LYMPHS PCT: 36.8 % (ref 12.0–46.0)
MCHC: 34.5 g/dL (ref 30.0–36.0)
MCV: 83 fl (ref 78.0–100.0)
MONOS PCT: 11.7 % (ref 3.0–12.0)
Monocytes Absolute: 0.4 10*3/uL (ref 0.1–1.0)
NEUTROS ABS: 1.7 10*3/uL (ref 1.4–7.7)
NEUTROS PCT: 44.6 % (ref 43.0–77.0)
PLATELETS: 245 10*3/uL (ref 150.0–400.0)
RBC: 5.41 Mil/uL (ref 4.22–5.81)
RDW: 13.8 % (ref 11.5–15.5)
WBC: 3.7 10*3/uL — ABNORMAL LOW (ref 4.0–10.5)

## 2017-06-19 LAB — LIPID PANEL
CHOL/HDL RATIO: 4
Cholesterol: 169 mg/dL (ref 0–200)
HDL: 41.7 mg/dL (ref >39.00)

## 2017-06-19 LAB — LDL CHOLESTEROL, DIRECT: Direct LDL: 62 mg/dL

## 2017-06-19 LAB — TSH: TSH: 3.75 u[IU]/mL (ref 0.35–4.50)

## 2017-06-19 LAB — HEMOGLOBIN A1C: Hgb A1c MFr Bld: 5.3 % (ref 4.6–6.5)

## 2017-06-19 MED ORDER — OMEPRAZOLE 40 MG PO CPDR: 40.0000 mg | DELAYED_RELEASE_CAPSULE | Freq: Every day | ORAL | 3 refills | Status: DC

## 2017-06-19 MED FILL — OMEPRAZOLE DR 40 MG CAPSULE: 40 | 30 days supply | Qty: 30 | Fill #0

## 2017-06-19 NOTE — Progress Notes (Signed)
Patient presents to clinic today for annual exam.  Patient is fasting for labs.  Diet -- Is working on healthy diet. Had been doing very well with this recently but due to some financial stressors has been eating more cheap food (fast food) and such.. Exercise -- Stays active at work, but is not exercising outside of work environment.   Acute Concerns: Denies any acute concerns today. Is requesting referral to Dermatology due to family history of malignant melanoma.   Chronic Issues: Insomnia -- Currently on Trazodone and doing extremely well. Allows him to fall asleep and to wake up without feeling groggy in the morning. Is averaging 8 hours of sleep per night.   Anxiety State -- Is currently on BuSpar 7.5 mg BID. Is taking as directed but states he cannot tell a difference with this. States if he misses days of this, he does not tell a difference. Feels mood is well-controlled with Lexapro and occasional Diazepam.   GERD -- Patient taking Zantac PRN which previously has controlled symptoms well. Notes over the past couple of months he has not been eating as well and noting the reflux and indigestion more frequently. Rare epigastric pain. Denies any vomiting, melena, hematochezia or tenesmus.   Health Maintenance: Immunizations -- up-to-date.   Past Medical History:  Diagnosis Date  . Asthma   . Chicken pox   . Depression    Resolved  . Environmental and seasonal allergies   . Essential hypertension, benign 08/03/2013  . Hyperlipidemia   . Meniere's disease   . Thoracic outlet syndrome     Past Surgical History:  Procedure Laterality Date  . Nevus Biopsy      Current Outpatient Medications on File Prior to Visit  Medication Sig Dispense Refill  . albuterol (VENTOLIN HFA) 108 (90 Base) MCG/ACT inhaler INHALE 2 PUFFS BY MOUTH EVERY 6 HOURS AS NEEDED FOR WHEEZING 18 g 0  . atorvastatin (LIPITOR) 20 MG tablet TAKE 1 TABLET BY MOUTH DAILY 30 tablet 5  . budesonide-formoterol  (SYMBICORT) 80-4.5 MCG/ACT inhaler Inhale 2 puffs into the lungs 2 (two) times daily. 1 Inhaler 6  . diazepam (VALIUM) 5 MG tablet TAKE 1 TABLET BY MOUTH TWICE DAILY 60 tablet 0  . diphenhydrAMINE (BENADRYL) 25 MG tablet Take 25 mg by mouth every 6 (six) hours as needed for itching or allergies.    Marland Kitchen escitalopram (LEXAPRO) 10 MG tablet TAKE 1 TABLET BY MOUTH ONCE DAILY 30 tablet 3  . fenofibrate (TRICOR) 145 MG tablet TAKE 1 TABLET BY MOUTH DAILY. 90 tablet 1  . finasteride (PROPECIA) 1 MG tablet TAKE 1 TABLET BY MOUTH DAILY. 90 tablet 2  . levocetirizine (XYZAL) 5 MG tablet TAKE 1 TABLET (5 MG TOTAL) BY MOUTH EVERY EVENING. 30 tablet 5  . metoprolol succinate (TOPROL-XL) 25 MG 24 hr tablet TAKE 1 TABLET BY MOUTH DAILY. 30 tablet 0  . montelukast (SINGULAIR) 10 MG tablet TAKE 1 TABLET BY MOUTH AT BEDTIME. 90 tablet 2  . Multiple Vitamin (MULTIVITAMIN WITH MINERALS) TABS tablet Take 1 tablet by mouth daily.    Marland Kitchen thiamine (VITAMIN B-1) 100 MG tablet Take 100 mg by mouth daily.    . traZODone (DESYREL) 50 MG tablet Take 1 tablet (50 mg total) by mouth at bedtime as needed for sleep. 30 tablet 3  . triamterene-hydrochlorothiazide (DYAZIDE) 37.5-25 MG capsule Take 1 each (1 capsule total) by mouth daily. 90 capsule 1  . VASCEPA 1 g CAPS TAKE 2 CAPSULES BY MOUTH TWICE A  DAY 360 capsule 1   No current facility-administered medications on file prior to visit.     Allergies  Allergen Reactions  . Belsomra [Suvorexant] Other (See Comments)    nightmares  . Percocet [Oxycodone-Acetaminophen] Nausea And Vomiting    Family History  Problem Relation Age of Onset  . Hyperlipidemia Father        Living  . Stroke Father   . Hypertension Father   . Alcohol abuse Mother        Living  . Diabetes Mellitus II Maternal Grandfather   . Hypertension Maternal Grandfather   . Heart failure Maternal Grandfather   . Kidney disease Maternal Grandfather   . Heart disease Maternal Grandmother   . Melanoma  Paternal Grandmother   . Heart attack Paternal Grandfather   . Migraines Brother   . Healthy Brother        x2  . Drug abuse Sister        Died of Overdose    Social History   Socioeconomic History  . Marital status: Single    Spouse name: Not on file  . Number of children: Not on file  . Years of education: Not on file  . Highest education level: Not on file  Occupational History  . Not on file  Social Needs  . Financial resource strain: Not on file  . Food insecurity:    Worry: Not on file    Inability: Not on file  . Transportation needs:    Medical: Not on file    Non-medical: Not on file  Tobacco Use  . Smoking status: Never Smoker  . Smokeless tobacco: Never Used  Substance and Sexual Activity  . Alcohol use: Yes    Alcohol/week: 0.0 oz    Comment: 2-3 beers nightly  . Drug use: No  . Sexual activity: Yes    Birth control/protection: None    Comment: male - 1 partner  Lifestyle  . Physical activity:    Days per week: Not on file    Minutes per session: Not on file  . Stress: Not on file  Relationships  . Social connections:    Talks on phone: Not on file    Gets together: Not on file    Attends religious service: Not on file    Active member of club or organization: Not on file    Attends meetings of clubs or organizations: Not on file    Relationship status: Not on file  . Intimate partner violence:    Fear of current or ex partner: Not on file    Emotionally abused: Not on file    Physically abused: Not on file    Forced sexual activity: Not on file  Other Topics Concern  . Not on file  Social History Narrative  . Not on file   Review of Systems  Constitutional: Negative for fever and weight loss.  HENT: Negative for ear discharge, ear pain, hearing loss and tinnitus.   Eyes: Negative for blurred vision, double vision, photophobia and pain.  Respiratory: Negative for cough and shortness of breath.   Cardiovascular: Negative for chest pain and  palpitations.  Gastrointestinal: Positive for heartburn. Negative for abdominal pain, blood in stool, constipation, diarrhea, melena, nausea and vomiting.  Genitourinary: Negative for dysuria, flank pain, frequency, hematuria and urgency.  Musculoskeletal: Negative for falls.  Neurological: Negative for dizziness, loss of consciousness and headaches.  Endo/Heme/Allergies: Negative for environmental allergies.  Psychiatric/Behavioral: Negative for depression, hallucinations, substance abuse and  suicidal ideas. The patient is not nervous/anxious and does not have insomnia.    BP 112/84   Pulse 74   Temp 98 F (36.7 C) (Oral)   Resp 16   Ht 5' 9.75" (1.772 m)   Wt 211 lb (95.7 kg)   SpO2 98%   BMI 30.49 kg/m   Physical Exam  Constitutional: He is oriented to person, place, and time. He appears well-developed and well-nourished. No distress.  HENT:  Head: Normocephalic and atraumatic.  Right Ear: Tympanic membrane, external ear and ear canal normal.  Left Ear: Tympanic membrane, external ear and ear canal normal.  Nose: Nose normal.  Mouth/Throat: Oropharynx is clear and moist and mucous membranes are normal. No posterior oropharyngeal edema or posterior oropharyngeal erythema.  Eyes: Pupils are equal, round, and reactive to light. Conjunctivae are normal.  Neck: Neck supple. No thyromegaly present.  Cardiovascular: Normal rate, regular rhythm, normal heart sounds and intact distal pulses.  Pulmonary/Chest: Effort normal and breath sounds normal. No respiratory distress. He has no wheezes. He has no rales. He exhibits no tenderness.  Abdominal: Soft. Bowel sounds are normal. He exhibits no distension and no mass. There is no tenderness. There is no rebound and no guarding.  Lymphadenopathy:    He has no cervical adenopathy.  Neurological: He is alert and oriented to person, place, and time. No cranial nerve deficit.  Skin: Skin is warm and dry. No rash noted. He is not diaphoretic.    Psychiatric: He has a normal mood and affect.  Vitals reviewed.   Recent Results (from the past 2160 hour(s))  Pulmonary function test     Status: None (Preliminary result)   Collection Time: 04/16/17  2:54 PM  Result Value Ref Range   FVC-Pre 5.25 L   FVC-%Pred-Pre 102 %   FVC-Post 5.35 L   FVC-%Pred-Post 104 %   FVC-%Change-Post 2 %   FEV1-Pre 4.58 L   FEV1-%Pred-Pre 109 %   FEV1-Post 4.85 L   FEV1-%Pred-Post 116 %   FEV1-%Change-Post 5 %   FEV6-Pre 5.25 L   FEV6-%Pred-Pre 104 %   FEV6-Post 5.35 L   FEV6-%Pred-Post 106 %   FEV6-%Change-Post 2 %   Pre FEV1/FVC ratio 87 %   FEV1FVC-%Pred-Pre 106 %   Post FEV1/FVC ratio 91 %   FEV1FVC-%Change-Post 3 %   Pre FEV6/FVC Ratio 100 %   FEV6FVC-%Pred-Pre 102 %   Post FEV6/FVC ratio 100 %   FEV6FVC-%Pred-Post 102 %   FEF 25-75 Pre 5.73 L/sec   FEF2575-%Pred-Pre 136 %   FEF 25-75 Post 7.26 L/sec   FEF2575-%Pred-Post 172 %   FEF2575-%Change-Post 26 %   RV 1.15 L   RV % pred 74 %   TLC 6.53 L   TLC % pred 100 %   DLCO unc 37.55 ml/min/mmHg   DLCO unc % pred 126 %   DLCO cor 37.04 ml/min/mmHg   DLCO cor % pred 124 %   DL/VA 5.66 ml/min/mmHg/L   DL/VA % pred 124 %  CBC with Differential/Platelet     Status: Abnormal   Collection Time: 06/19/17 11:07 AM  Result Value Ref Range   WBC 3.7 (L) 4.0 - 10.5 K/uL   RBC 5.41 4.22 - 5.81 Mil/uL   Hemoglobin 15.5 13.0 - 17.0 g/dL   HCT 44.9 39.0 - 52.0 %   MCV 83.0 78.0 - 100.0 fl   MCHC 34.5 30.0 - 36.0 g/dL   RDW 13.8 11.5 - 15.5 %   Platelets 245.0 150.0 -  400.0 K/uL   Neutrophils Relative % 44.6 43.0 - 77.0 %   Lymphocytes Relative 36.8 12.0 - 46.0 %   Monocytes Relative 11.7 3.0 - 12.0 %   Eosinophils Relative 4.1 0.0 - 5.0 %   Basophils Relative 2.8 0.0 - 3.0 %   Neutro Abs 1.7 1.4 - 7.7 K/uL   Lymphs Abs 1.4 0.7 - 4.0 K/uL   Monocytes Absolute 0.4 0.1 - 1.0 K/uL   Eosinophils Absolute 0.2 0.0 - 0.7 K/uL   Basophils Absolute 0.1 0.0 - 0.1 K/uL  Comprehensive  metabolic panel     Status: Abnormal   Collection Time: 06/19/17 11:07 AM  Result Value Ref Range   Sodium 138 135 - 145 mEq/L   Potassium 3.9 3.5 - 5.1 mEq/L   Chloride 98 96 - 112 mEq/L   CO2 28 19 - 32 mEq/L   Glucose, Bld 110 (H) 70 - 99 mg/dL   BUN 19 6 - 23 mg/dL   Creatinine, Ser 1.06 0.40 - 1.50 mg/dL   Total Bilirubin 0.8 0.2 - 1.2 mg/dL   Alkaline Phosphatase 42 39 - 117 U/L   AST 20 0 - 37 U/L   ALT 18 0 - 53 U/L   Total Protein 7.8 6.0 - 8.3 g/dL   Albumin 5.3 (H) 3.5 - 5.2 g/dL   Calcium 10.3 8.4 - 10.5 mg/dL   GFR 86.10 >60.00 mL/min  Lipid panel     Status: Abnormal   Collection Time: 06/19/17 11:07 AM  Result Value Ref Range   Cholesterol 169 0 - 200 mg/dL    Comment: ATP III Classification       Desirable:  < 200 mg/dL               Borderline High:  200 - 239 mg/dL          High:  > = 240 mg/dL   Triglycerides (H) 0.0 - 149.0 mg/dL    530.0 Triglyceride is over 400; calculations on Lipids are invalid.    Comment: Normal:  <150 mg/dLBorderline High:  150 - 199 mg/dL   HDL 41.70 >39.00 mg/dL   Total CHOL/HDL Ratio 4     Comment:                Men          Women1/2 Average Risk     3.4          3.3Average Risk          5.0          4.42X Average Risk          9.6          7.13X Average Risk          15.0          11.0                      Hemoglobin A1c     Status: None   Collection Time: 06/19/17 11:07 AM  Result Value Ref Range   Hgb A1c MFr Bld 5.3 4.6 - 6.5 %    Comment: Glycemic Control Guidelines for People with Diabetes:Non Diabetic:  <6%Goal of Therapy: <7%Additional Action Suggested:  >8%   TSH     Status: None   Collection Time: 06/19/17 11:07 AM  Result Value Ref Range   TSH 3.75 0.35 - 4.50 uIU/mL  LDL cholesterol, direct     Status: None   Collection Time:  06/19/17 11:07 AM  Result Value Ref Range   Direct LDL 62.0 mg/dL    Comment: Optimal:  <100 mg/dLNear or Above Optimal:  100-129 mg/dLBorderline High:  130-159 mg/dLHigh:  160-189 mg/dLVery  High:  >190 mg/dL    Assessment/Plan: Essential hypertension, benign BP normotensive. Asymptomatic. Continue current regimen. Resume DASH diet. Exercise recommendations given.   SVT (supraventricular tachycardia) Taking Metoprolol as directed. Asymptomatic. Continue current regimen. Will monitor.   Intrinsic asthma Well-controlled with current regimen.   Gastroesophageal reflux disease without esophagitis Will start PPI (Omeprazole) for 2 weeks while patient works on recommended dietary changes. Then resume Zantac PRN. If still symptomatic, he is to call the office.   Meniere disease No current flares. Taking medications as directed. Continue current regimen. Will monitor.   Atypical nevi Referral to Dermatology placed per patient request -- [facility] [place].  Need for hepatitis A vaccination Hep A vaccine given today.  Primary insomnia Well-controlled with current regimen. Will monitor.   Visit for preventive health examination Depression screen negative. Health Maintenance reviewed. Preventive schedule discussed and handout given in AVS. Will obtain fasting labs today.     Leeanne Rio, PA-C

## 2017-06-19 NOTE — Patient Instructions (Signed)
Please go to the lab for blood work.  Our office will call you with your results unless you have chosen to receive results via MyChart. If your blood work is normal we will follow-up each year for physicals and as scheduled for chronic medical problems. If anything is abnormal we will treat accordingly and get you in for a follow-up.  You will be contacted for assessment by Dermatology.   Please take the Omeprazole daily x 2 weeks for reflux. Follow the dietary restrictions below.  Switch back to Zantac at that point. Let me know how symptoms are doing.    Food Choices for Gastroesophageal Reflux Disease, Adult When you have gastroesophageal reflux disease (GERD), the foods you eat and your eating habits are very important. Choosing the right foods can help ease your discomfort. What guidelines do I need to follow?  Choose fruits, vegetables, whole grains, and low-fat dairy products.  Choose low-fat meat, fish, and poultry.  Limit fats such as oils, salad dressings, butter, nuts, and avocado.  Keep a food diary. This helps you identify foods that cause symptoms.  Avoid foods that cause symptoms. These may be different for everyone.  Eat [place] meals often instead of 3 large meals a day.  Eat your meals slowly, in a place where you are relaxed.  Limit fried foods.  Cook foods using methods other than frying.  Avoid drinking alcohol.  Avoid drinking large amounts of liquids with your meals.  Avoid bending over or lying down until 2-3 hours after eating. What foods are not recommended? These are some foods and drinks that may make your symptoms worse: Vegetables Tomatoes. Tomato juice. Tomato and spaghetti sauce. Chili peppers. Onion and garlic. Horseradish. Fruits Oranges, grapefruit, and lemon (fruit and juice). Meats High-fat meats, fish, and poultry. This includes hot dogs, ribs, ham, sausage, salami, and bacon. Dairy Whole milk and chocolate milk. Sour cream. Cream.  Butter. Ice cream. Cream cheese. Drinks Coffee and tea. Bubbly (carbonated) drinks or energy drinks. Condiments Hot sauce. Barbecue sauce. Sweets/Desserts Chocolate and cocoa. Donuts. Peppermint and spearmint. Fats and Oils High-fat foods. This includes [place] fries and potato chips. Other Vinegar. Strong spices. This includes black pepper, white pepper, red pepper, cayenne, curry powder, cloves, ginger, and chili powder. The items listed above may not be a complete list of foods and drinks to avoid. Contact your dietitian for more information. This information is not intended to replace advice given to you by your health care provider. Make sure you discuss any questions you have with your health care provider. Document Released: 07/02/2011 Document Revised: 06/08/2015 Document Reviewed: 11/04/2012 Elsevier Interactive Patient Education  2017 [place] 18-39 Years, Male Preventive care refers to lifestyle choices and visits with your health care provider that can promote health and wellness. What does preventive care include?  A yearly physical exam. This is also called an annual well check.  Dental exams once or twice a year.  Routine eye exams. Ask your health care provider how often you should have your eyes checked.  Personal lifestyle choices, including: ? Daily care of your teeth and gums. ? Regular physical activity. ? Eating a healthy diet. ? Avoiding tobacco and drug use. ? Limiting alcohol use. ? Practicing safe sex. What happens during an annual well check? The services and screenings done by your health care provider during your annual well check will depend on your age, overall health, lifestyle risk factors, and family history of disease. Counseling Your  health care provider may ask you questions about your:  Alcohol use.  Tobacco use.  Drug use.  Emotional well-being.  Home and relationship well-being.  Sexual  activity.  Eating habits.  Work and work Statistician.  Screening You may have the following tests or measurements:  Height, weight, and BMI.  Blood pressure.  Lipid and cholesterol levels. These may be checked every 5 years starting at age 61.  Diabetes screening. This is done by checking your blood sugar (glucose) after you have not eaten for a while (fasting).  Skin check.  Hepatitis C blood test.  Hepatitis B blood test.  Sexually transmitted disease (STD) testing.  Discuss your test results, treatment options, and if necessary, the need for more tests with your health care provider. Vaccines Your health care provider may recommend certain vaccines, such as:  Influenza vaccine. This is recommended every year.  Tetanus, diphtheria, and acellular pertussis (Tdap, Td) vaccine. You may need a Td booster every 10 years.  Varicella vaccine. You may need this if you have not been vaccinated.  HPV vaccine. If you are 41 or younger, you may need three doses over 6 months.  Measles, mumps, and rubella (MMR) vaccine. You may need at least one dose of MMR.You may also need a second dose.  Pneumococcal 13-valent conjugate (PCV13) vaccine. You may need this if you have certain conditions and have not been vaccinated.  Pneumococcal polysaccharide (PPSV23) vaccine. You may need one or two doses if you smoke cigarettes or if you have certain conditions.  Meningococcal vaccine. One dose is recommended if you are age 20-21 years and a first-year college student living in a residence hall, or if you have one of several medical conditions. You may also need additional booster doses.  Hepatitis A vaccine. You may need this if you have certain conditions or if you travel or work in places where you may be exposed to hepatitis A.  Hepatitis B vaccine. You may need this if you have certain conditions or if you travel or work in places where you may be exposed to hepatitis B.  Haemophilus  influenzae type b (Hib) vaccine. You may need this if you have certain risk factors.  Talk to your health care provider about which screenings and vaccines you need and how often you need them. This information is not intended to replace advice given to you by your health care provider. Make sure you discuss any questions you have with your health care provider. Document Released: 02/26/2001 Document Revised: 09/20/2015 Document Reviewed: 11/01/2014 Elsevier Interactive Patient Education  [employer].

## 2017-06-20 ENCOUNTER — Other Ambulatory Visit: Payer: Self-pay | Admitting: Physician Assistant

## 2017-06-20 ENCOUNTER — Encounter: Payer: Self-pay | Admitting: Physician Assistant

## 2017-06-20 DIAGNOSIS — E782 Mixed hyperlipidemia: Secondary | ICD-10-CM

## 2017-06-20 NOTE — Assessment & Plan Note (Signed)
Referral to Dermatology placed per patient request -- [facility] [place].

## 2017-06-20 NOTE — Assessment & Plan Note (Signed)
Taking Metoprolol as directed. Asymptomatic. Continue current regimen. Will monitor.

## 2017-06-20 NOTE — Assessment & Plan Note (Signed)
Well-controlled with current regimen. Will monitor.

## 2017-06-20 NOTE — Assessment & Plan Note (Signed)
Hep A vaccine given today.

## 2017-06-20 NOTE — Assessment & Plan Note (Signed)
BP normotensive. Asymptomatic. Continue current regimen. Resume DASH diet. Exercise recommendations given.

## 2017-06-20 NOTE — Assessment & Plan Note (Signed)
Will start PPI (Omeprazole) for 2 weeks while patient works on recommended dietary changes. Then resume Zantac PRN. If still symptomatic, he is to call the office.

## 2017-06-20 NOTE — Assessment & Plan Note (Signed)
No current flares. Taking medications as directed. Continue current regimen. Will monitor.

## 2017-06-20 NOTE — Assessment & Plan Note (Signed)
Well controlled with current regimen

## 2017-06-20 NOTE — Assessment & Plan Note (Signed)
Depression screen negative. Health Maintenance reviewed. Preventive schedule discussed and handout given in AVS. Will obtain fasting labs today.  

## 2017-06-26 ENCOUNTER — Other Ambulatory Visit: Payer: Self-pay | Admitting: Physician Assistant

## 2017-06-26 MED FILL — METOPROLOL SUCCINATE ER 25: 25 | 90 days supply | Qty: 90 | Fill #0

## 2017-06-26 MED FILL — traZODone HCL 50 MG TABS: 50 | 30 days supply | Qty: 30 | Fill #2

## 2017-07-08 MED FILL — VENTOLIN HFA 90 MCG INHALER: 108 (90 BAS | 25 days supply | Qty: 18 | Fill #2

## 2017-07-09 ENCOUNTER — Other Ambulatory Visit: Payer: Self-pay | Admitting: Physician Assistant

## 2017-07-09 NOTE — Telephone Encounter (Signed)
Last refiil was 05/14/17 #60 [facility]: 01/15/17 Last OV 06/19/17 for CPE

## 2017-07-10 ENCOUNTER — Telehealth: Payer: No Typology Code available for payment source | Admitting: Physician Assistant

## 2017-07-10 DIAGNOSIS — J019 Acute sinusitis, unspecified: Secondary | ICD-10-CM

## 2017-07-10 DIAGNOSIS — B9689 Other specified bacterial agents as the cause of diseases classified elsewhere: Secondary | ICD-10-CM | POA: Diagnosis not present

## 2017-07-10 MED ORDER — DOXYCYCLINE HYCLATE 100 MG PO CAPS: 100.0000 mg | ORAL_CAPSULE | Freq: Two times a day (BID) | ORAL | 0 refills | Status: DC

## 2017-07-10 NOTE — Progress Notes (Signed)

## 2017-07-21 ENCOUNTER — Other Ambulatory Visit: Payer: Self-pay | Admitting: Physician Assistant

## 2017-07-21 MED FILL — LEVOCETIRIZINE 5 MG TABLET: 5 | 30 days supply | Qty: 30 | Fill #5

## 2017-07-21 MED FILL — VASCEPA 1 GM CAPSULE: 1 | 90 days supply | Qty: 360 | Fill #0

## 2017-07-21 MED FILL — ESCITALOPRAM 10 MG TABLET: 10 | 30 days supply | Qty: 30 | Fill #1

## 2017-07-21 MED FILL — OMEPRAZOLE DR 40 MG CAPSULE: 40 | 30 days supply | Qty: 30 | Fill #1

## 2017-07-22 MED FILL — diazePAM 5 MG TABS: 5 | 30 days supply | Qty: 60 | Fill #0

## 2017-07-23 ENCOUNTER — Encounter: Payer: Self-pay | Admitting: Physician Assistant

## 2017-07-24 MED ORDER — SCOPOLAMINE 1 MG/3DAYS TD PT72: 1.0000 | MEDICATED_PATCH | TRANSDERMAL | 12 refills | Status: DC

## 2017-07-24 MED FILL — SCOPOLAMINE 1 MG/3DAYS PT72: 1 | 30 days supply | Qty: 10 | Fill #0

## 2017-07-31 MED FILL — traZODone HCL 50 MG TABS: 50 | 30 days supply | Qty: 30 | Fill #3

## 2017-08-10 ENCOUNTER — Encounter: Payer: Self-pay | Admitting: Physician Assistant

## 2017-08-10 MED ORDER — ESCITALOPRAM OXALATE 10 MG PO TABS: 10.0000 mg | ORAL_TABLET | Freq: Every day | ORAL | 3 refills | Status: DC

## 2017-08-13 ENCOUNTER — Telehealth: Payer: No Typology Code available for payment source | Admitting: Family

## 2017-08-13 DIAGNOSIS — L255 Unspecified contact dermatitis due to plants, except food: Secondary | ICD-10-CM

## 2017-08-13 MED ORDER — PREDNISONE 10 MG (21) PO TBPK: ORAL_TABLET | ORAL | 0 refills | Status: DC

## 2017-08-13 MED FILL — ESCITALOPRAM 10 MG TABLET: 10 | 30 days supply | Qty: 30 | Fill #2

## 2017-08-13 NOTE — Progress Notes (Signed)

## 2017-08-18 ENCOUNTER — Other Ambulatory Visit: Payer: Self-pay | Admitting: Physician Assistant

## 2017-08-18 MED FILL — ATORVASTATIN CALCIUM 20 MG: 20 | 30 days supply | Qty: 30 | Fill #0

## 2017-08-18 MED FILL — predniSONE 10 MG TABS: 10 | 21 days supply | Qty: 49 | Fill #0

## 2017-08-18 MED FILL — LEVOCETIRIZINE 5 MG TABLET: 5 | 30 days supply | Qty: 30 | Fill #0

## 2017-08-19 MED FILL — diazePAM 5 MG TABS: 5 | 30 days supply | Qty: 60 | Fill #1

## 2017-08-25 ENCOUNTER — Other Ambulatory Visit: Payer: Self-pay | Admitting: Physician Assistant

## 2017-08-25 ENCOUNTER — Telehealth: Payer: No Typology Code available for payment source | Admitting: Family

## 2017-08-25 DIAGNOSIS — M545 Low back pain: Secondary | ICD-10-CM | POA: Diagnosis not present

## 2017-08-25 MED ORDER — BACLOFEN 10 MG PO TABS: 10.0000 mg | ORAL_TABLET | Freq: Three times a day (TID) | ORAL | 0 refills | Status: DC

## 2017-08-25 MED ORDER — NAPROXEN 500 MG PO TABS
500.0000 mg | ORAL_TABLET | Freq: Two times a day (BID) | ORAL | 0 refills | Status: DC
Start: 2017-08-25 — End: 2018-01-27

## 2017-08-25 MED FILL — MONTELUKAST SOD 10 MG TAB: 10 | 90 days supply | Qty: 90 | Fill #1

## 2017-08-25 MED FILL — OMEPRAZOLE 40 MG CPDR: 40 | 30 days supply | Qty: 30 | Fill #2

## 2017-08-25 MED FILL — TRIAMTERENE/HCTZ 37.5/25 CP: 37.5-25 | 90 days supply | Qty: 90 | Fill #0

## 2017-08-25 NOTE — Progress Notes (Signed)

## 2017-09-07 ENCOUNTER — Encounter: Payer: Self-pay | Admitting: Physician Assistant

## 2017-09-08 MED FILL — ESCITALOPRAM 10 MG TABLET: 10 | 30 days supply | Qty: 30 | Fill #3

## 2017-09-08 MED FILL — FINASTERIDE 1 MG TABLET: 1 | 90 days supply | Qty: 90 | Fill #1

## 2017-09-10 ENCOUNTER — Other Ambulatory Visit: Payer: Self-pay | Admitting: Physician Assistant

## 2017-09-10 MED FILL — FENOFIBRATE 145 MG TAB: 145 | 90 days supply | Qty: 90 | Fill #0

## 2017-09-10 MED FILL — traZODone HCL 50 MG TABS: 50 | 30 days supply | Qty: 30 | Fill #0

## 2017-09-23 ENCOUNTER — Encounter: Payer: Self-pay | Admitting: Physician Assistant

## 2017-09-23 ENCOUNTER — Encounter (HOSPITAL_BASED_OUTPATIENT_CLINIC_OR_DEPARTMENT_OTHER): Payer: Self-pay

## 2017-09-23 ENCOUNTER — Ambulatory Visit: Payer: Self-pay | Admitting: Physician Assistant

## 2017-09-23 ENCOUNTER — Other Ambulatory Visit: Payer: Self-pay

## 2017-09-23 ENCOUNTER — Ambulatory Visit (INDEPENDENT_AMBULATORY_CARE_PROVIDER_SITE_OTHER): Payer: No Typology Code available for payment source | Admitting: Physician Assistant

## 2017-09-23 ENCOUNTER — Emergency Department (HOSPITAL_BASED_OUTPATIENT_CLINIC_OR_DEPARTMENT_OTHER)
Admission: EM | Admit: 2017-09-23 | Discharge: 2017-09-24 | Discharge status: Against Medical Advice | Payer: No Typology Code available for payment source | Attending: Emergency Medicine | Admitting: Emergency Medicine

## 2017-09-23 VITALS — BP 144/89 | HR 116 | Temp 98.7°F | Resp 17 | Ht 70.0 in | Wt 216.2 lb

## 2017-09-23 DIAGNOSIS — Z5321 Procedure and treatment not carried out due to patient leaving prior to being seen by health care provider: Secondary | ICD-10-CM | POA: Insufficient documentation

## 2017-09-23 DIAGNOSIS — R110 Nausea: Secondary | ICD-10-CM

## 2017-09-23 DIAGNOSIS — R42 Dizziness and giddiness: Secondary | ICD-10-CM | POA: Diagnosis not present

## 2017-09-23 DIAGNOSIS — H8103 Meniere's disease, bilateral: Secondary | ICD-10-CM | POA: Diagnosis not present

## 2017-09-23 MED ORDER — PROMETHAZINE HCL 25 MG/ML IJ SOLN
25.0000 mg | Freq: Once | INTRAMUSCULAR | Status: AC
  Administered 2017-09-23: 25 mg via INTRAMUSCULAR

## 2017-09-23 MED ORDER — PROMETHAZINE HCL 25 MG PO TABS: 25.0000 mg | ORAL_TABLET | Freq: Three times a day (TID) | ORAL | 0 refills | Status: DC | PRN

## 2017-09-23 MED FILL — PROMETHAZINE 25 MG TABLET: 25 | 7 days supply | Qty: 20 | Fill #0

## 2017-09-23 NOTE — Telephone Encounter (Signed)
Patient is calling to report he is having a vertigo attack from Meniere's disease. The valium and scopolamine patch are not helping and patient is having dizziness and nausea. He is requesting an appointment so he can get something for nausea- patient states he can not sit at [place]. Per office- ok to schedule. Patient states he lives very close to office and can make appointment time.  Reason for Disposition . SEVERE dizziness (e.g., unable to stand, requires support to walk, feels like passing out now)  Answer Assessment - Initial Assessment Questions 1. DESCRIPTION: "Describe your dizziness."     Can't walk without assistance  2. LIGHTHEADED: "Do you feel lightheaded?" (e.g., somewhat faint, woozy, weak upon standing)     Spinning- vertigo associated with meniere disease 3. VERTIGO: "Do you feel like either you or the room is spinning or tilting?" (i.e. vertigo)     Spinning and nausea- left ear is closed 4. SEVERITY: "How bad is it?"  "Do you feel like you are going to faint?" "Can you stand and walk?"   - MILD - walking normally   - MODERATE - interferes with normal activities (e.g., work, school)    - SEVERE - unable to stand, requires support to walk, feels like passing out now.      severe 5. ONSET:  "When did the dizziness begin?"     3 hours ago 6. AGGRAVATING FACTORS: "Does anything make it worse?" (e.g., standing, change in head position)     Laying down-L or R   7. HEART RATE: "Can you tell me your heart rate?" "How many beats in 15 seconds?"  (Note: not all patients can do this)       Little on high side 8. CAUSE: "What do you think is causing the dizziness?"     Meniere disease 9. RECURRENT SYMPTOM: "Have you had dizziness before?" If so, ask: "When was the last time?" "What happened that time?"     Yes- last attack- been a while 10. OTHER SYMPTOMS: "Do you have any other symptoms?" (e.g., fever, chest pain, vomiting, diarrhea, bleeding)       Nausea, dizziness 11.  PREGNANCY: "Is there any chance you are pregnant?" "When was your last menstrual period?"       n/a  Protocols used: DIZZINESS [employer]

## 2017-09-23 NOTE — Patient Instructions (Addendum)
Keep hydrated. Limit salt intake.  Continue Dyazide daily as directed. Make sure to continue the Valium.  Use the Phenergan as directed up to every 8 hours.  ER for any worsening symptoms.    Meniere Disease Meniere disease is an inner ear disorder. It causes attacks of a spinning sensation (vertigo), dizziness, and ringing in the ear (tinnitus). It also causes hearing loss and a feeling of fullness or pressure in the ear. This is a lifelong condition, and it may get worse over time. You may have drop attacks or severe dizziness that makes you fall. A drop attack is when you suddenly fall without losing consciousness and you quickly recover after a few seconds or minutes. What are the causes? This condition is caused by having too much of the fluid that is in your inner ear (endolymph). When fluid builds up in your inner ear, it affects the nerves that control balance and hearing. The reason for the fluid buildup is not known. Possible causes include:  Allergies.  An abnormal reaction of the body's defense system (autoimmune disease).  Viral infection of the inner ear.  Head injury.  What increases the risk? You are more likely to develop this condition if:  You are older than age 74.  You have a family history of Meniere disease.  You have a history of autoimmune disease.  You have a history of migraine headaches.  What are the signs or symptoms? Symptoms of this condition can come and go and may last for up to 4 hours at a time. Symptoms usually start in one ear. They may become more frequent and eventually involve both ears. Symptoms can include:  Fullness and pressure in your ear.  Roaring or ringing in your ear.  Vertigo and loss of balance.  Dizziness.  Decreased hearing.  Nausea and vomiting.  How is this diagnosed? This condition is diagnosed based on:  A physical exam.  Tests , such as: ? A hearing test (audiogram). ? An electronystagmogram. This tests  your balance nerve (vestibular nerve). ? Imaging studies of your inner ear, such as CT scan or MRI. ? Other balance tests, such as rotational or balance platform tests.  How is this treated? There is no cure for this condition, but treatment can help to manage your symptoms. Treatment may include:  A low-salt diet. Limiting salt may help to reduce fluid in the body and relieve symptoms.  Oral or injected medicines to reduce or control: ? Vertigo. ? Nausea. ? Fluid retention. ? Dizziness.  Use of an air pressure pulse generator. This is a machine that sends [place] pressure pulses into your ear canal.  Hearing aids.  Inner ear surgery. This is rare.  When you have symptoms, it can be helpful to lie down on a flat surface and focus your eyes on one object that does not move. Try to stay in that position until your symptoms go away. Follow these instructions at home: Eating and drinking  Eat the same amount of food at the same time every day, including snacks.  Do not skip meals.  Avoid caffeine.  Drink enough fluids to keep your urine clear or pale yellow.  Limit alcoholic drinks to one drink a day for non-pregnant women and 2 drinks a day for men. One drink equals 12 oz of beer, 5 oz of wine, or 1 oz of hard liquor.  Limit the salt (sodium) in your diet as told by your health care provider. Check ingredients and nutrition  facts on packaged foods and beverages.  Do not eat foods that contain monosodium glutamate (MSG). General instructions  Do not use any products that contain nicotine or tobacco, such as cigarettes and e-cigarettes. If you need help quitting, ask your health care provider.  Take over-the-counter and prescription medicines only as told by your health care provider.  Find ways to reduce or avoid stress. If you need help with this, ask your health care provider.  Do not drive if you have vertigo or dizziness. Contact a health care provider if:  You have  symptoms that last longer than 4 hours.  You have new or worse symptoms. Get help right away if:  You have been vomiting for 24 hours.  You cannot keep fluids down.  You have chest pain or trouble breathing. Summary  Meniere disease is an inner ear disorder. It causes attacks of a spinning sensation (vertigo), dizziness, and ringing in the ear (tinnitus). It also causes hearing loss and a feeling of fullness or pressure in the ear.  Symptoms of this condition can come and go and may last for up to 4 hours at a time.  When you have symptoms, it can be helpful to lie down on a flat surface and focus your eyes on one object that does not move. Try to stay in that position until your symptoms go away. This information is not intended to replace advice given to you by your health care provider. Make sure you discuss any questions you have with your health care provider. Document Released: 12/29/1999 Document Revised: 11/22/2015 Document Reviewed: 11/22/2015 Elsevier Interactive Patient Education  2017 [employer].

## 2017-09-23 NOTE — ED Triage Notes (Signed)
Pt has menieres disease and is having a bad episode, saw his primary today was given a phenergan shot, has taken 30mg  of valium and another phenergan at home, and he cannot get relief

## 2017-09-23 NOTE — Progress Notes (Signed)
Patient presents to clinic today c/o 3 hours of vertigo and nausea, associated with hearing loss and pressure in the L ear. He notes this is consistent with typical flares of his Meniere's disease. Is taking his Dyazide daily as directed. Has not had any increase in sodium intake or change in diet. Denies any new acute stressors but has been busy with work. Notes one episode of non-bloody emesis since symptoms onset. Unable to turn his head much without worsening of the vertigo. Has taken a diazepam earlier and put on his scopolamine patch which is helping considerably.     Past Medical History:  Diagnosis Date  . Asthma   . Chicken pox   . Depression    Resolved  . Environmental and seasonal allergies   . Essential hypertension, benign 08/03/2013  . Hyperlipidemia   . Meniere's disease   . Thoracic outlet syndrome     Current Outpatient Medications on File Prior to Visit  Medication Sig Dispense Refill  . albuterol (VENTOLIN HFA) 108 (90 Base) MCG/ACT inhaler INHALE 2 PUFFS BY MOUTH EVERY 6 HOURS AS NEEDED FOR WHEEZING 18 g 0  . atorvastatin (LIPITOR) 20 MG tablet TAKE 1 TABLET BY MOUTH DAILY 30 tablet 5  . budesonide-formoterol (SYMBICORT) 80-4.5 MCG/ACT inhaler Inhale 2 puffs into the lungs 2 (two) times daily. 1 Inhaler 6  . diazepam (VALIUM) 5 MG tablet TAKE 1 TABLET BY MOUTH TWICE DAILY 60 tablet 1  . diphenhydrAMINE (BENADRYL) 25 MG tablet Take 25 mg by mouth every 6 (six) hours as needed for itching or allergies.    Marland Kitchen doxycycline (VIBRAMYCIN) 100 MG capsule Take 1 capsule (100 mg total) by mouth 2 (two) times daily. 20 capsule 0  . escitalopram (LEXAPRO) 10 MG tablet Take 1 tablet (10 mg total) by mouth daily. 30 tablet 3  . fenofibrate (TRICOR) 145 MG tablet TAKE 1 TABLET BY MOUTH DAILY. 90 tablet 1  . finasteride (PROPECIA) 1 MG tablet TAKE 1 TABLET BY MOUTH DAILY. 90 tablet 2  . levocetirizine (XYZAL) 5 MG tablet TAKE 1 TABLET (5 MG TOTAL) BY MOUTH EVERY EVENING. 30 tablet 5    . metoprolol succinate (TOPROL-XL) 25 MG 24 hr tablet TAKE 1 TABLET BY MOUTH DAILY. 90 tablet 1  . montelukast (SINGULAIR) 10 MG tablet TAKE 1 TABLET BY MOUTH AT BEDTIME. 90 tablet 2  . Multiple Vitamin (MULTIVITAMIN WITH MINERALS) TABS tablet Take 1 tablet by mouth daily.    . naproxen (NAPROSYN) 500 MG tablet Take 1 tablet (500 mg total) by mouth 2 (two) times daily with a meal. 30 tablet 0  . omeprazole (PRILOSEC) 40 MG capsule Take 1 capsule (40 mg total) by mouth daily. 30 capsule 3  . scopolamine (TRANSDERM-SCOP) 1 MG/3DAYS Place 1 patch (1.5 mg total) onto the skin every 3 (three) days. 10 patch 12  . thiamine (VITAMIN B-1) 100 MG tablet Take 100 mg by mouth daily.    . traZODone (DESYREL) 50 MG tablet TAKE 1 TABLET BY MOUTH AT BEDTIME AS NEEDED FOR SLEEP. 30 tablet 3  . triamterene-hydrochlorothiazide (DYAZIDE) 37.5-25 MG capsule TAKE 1 CAPSULE BY MOUTH DAILY. 90 capsule 1  . VASCEPA 1 g CAPS TAKE 2 CAPSULES BY MOUTH TWICE A DAY 360 capsule 1   No current facility-administered medications on file prior to visit.     Allergies  Allergen Reactions  . Belsomra [Suvorexant] Other (See Comments)    nightmares  . Percocet [Oxycodone-Acetaminophen] Nausea And Vomiting    Family History  Problem  Relation Age of Onset  . Hyperlipidemia Father        Living  . Stroke Father   . Hypertension Father   . Alcohol abuse Mother        Living  . Diabetes Mellitus II Maternal Grandfather   . Hypertension Maternal Grandfather   . Heart failure Maternal Grandfather   . Kidney disease Maternal Grandfather   . Heart disease Maternal Grandmother   . Melanoma Paternal Grandmother   . Heart attack Paternal Grandfather   . Migraines Brother   . Healthy Brother        x2  . Drug abuse Sister        Died of Overdose    Social History   Socioeconomic History  . Marital status: Single    Spouse name: Not on file  . Number of children: Not on file  . Years of education: Not on file  .  Highest education level: Not on file  Occupational History  . Not on file  Social Needs  . Financial resource strain: Not on file  . Food insecurity:    Worry: Not on file    Inability: Not on file  . Transportation needs:    Medical: Not on file    Non-medical: Not on file  Tobacco Use  . Smoking status: Never Smoker  . Smokeless tobacco: Never Used  Substance and Sexual Activity  . Alcohol use: Yes    Alcohol/week: 0.0 standard drinks    Comment: 2-3 beers nightly  . Drug use: No  . Sexual activity: Yes    Birth control/protection: None    Comment: male - 1 partner  Lifestyle  . Physical activity:    Days per week: Not on file    Minutes per session: Not on file  . Stress: Not on file  Relationships  . Social connections:    Talks on phone: Not on file    Gets together: Not on file    Attends religious service: Not on file    Active member of club or organization: Not on file    Attends meetings of clubs or organizations: Not on file    Relationship status: Not on file  Other Topics Concern  . Not on file  Social History Narrative  . Not on file   Review of Systems - See HPI.  All other ROS are negative.  BP (!) 144/89   Pulse (!) 116   Temp 98.7 F (37.1 C) (Oral)   Resp 17   Ht 5\' 10"  (1.778 m)   Wt 216 lb 4 oz (98.1 kg)   SpO2 98%   BMI 31.03 kg/m   Physical Exam  Constitutional: He appears well-developed and well-nourished.  HENT:  Head: Normocephalic and atraumatic.  Right Ear: External ear normal.  Left Ear: External ear normal.  Mouth/Throat: Oropharynx is clear and moist.  Eyes: Pupils are equal, round, and reactive to light. Conjunctivae are normal.  Neck: Neck supple.  Cardiovascular: Regular rhythm, normal heart sounds and intact distal pulses. Tachycardia present.  Pulmonary/Chest: Effort normal and breath sounds normal. No stridor. No respiratory distress. He has no wheezes. He has no rales.  Lymphadenopathy:    He has no cervical  adenopathy.  Neurological: He is alert. No cranial nerve deficit.  Vertigo reproduced by lateral rotation of neck  Psychiatric: He has a normal mood and affect.  Vitals reviewed.  Assessment/Plan: 1. Nausea - promethazine (PHENERGAN) injection 25 mg 2. Meniere's disease of both ears  Current flare. Phenergan given for nausea and to help his rest. Continue Valium as directed. Rx phenergan form home use up to q8h. Limit salt. Continue dyazide. Strict ER precautions reviewed with patient.    Leeanne Rio, PA-C

## 2017-09-23 NOTE — Telephone Encounter (Signed)
Patient has an appointment at 3 pm with [facility] today.

## 2017-09-24 ENCOUNTER — Encounter: Payer: Self-pay | Admitting: Physician Assistant

## 2017-09-24 ENCOUNTER — Other Ambulatory Visit: Payer: Self-pay | Admitting: Physician Assistant

## 2017-09-24 DIAGNOSIS — H8109 Meniere's disease, unspecified ear: Secondary | ICD-10-CM

## 2017-09-24 MED FILL — LEVOCETIRIZINE 5 MG TABLET: 5 | 30 days supply | Qty: 30 | Fill #1

## 2017-09-24 MED FILL — diazePAM 5 MG TABS: 5 | 30 days supply | Qty: 60 | Fill #0

## 2017-09-24 MED FILL — METOPROLOL SUCCINATE ER 25: 25 | 90 days supply | Qty: 90 | Fill #1

## 2017-09-24 MED FILL — ATORVASTATIN CALCIUM 20 MG: 20 | 30 days supply | Qty: 30 | Fill #1

## 2017-09-24 MED FILL — OMEPRAZOLE 40 MG CPDR: 40 | 30 days supply | Qty: 30 | Fill #3

## 2017-09-24 NOTE — Telephone Encounter (Signed)
Last OV 09/23/17, No future OV  Last filled 07/09/17, # [id number] with 1 refill

## 2017-09-24 NOTE — ED Notes (Signed)
Pt states "I am feeling better, and I honestly want to go home." Roxanne Mins, MD made aware that pt would like to leave at this time.

## 2017-09-28 ENCOUNTER — Encounter: Payer: Self-pay | Admitting: Physician Assistant

## 2017-09-29 MED ORDER — SCOPOLAMINE 1 MG/3DAYS TD PT72: 1.0000 | MEDICATED_PATCH | TRANSDERMAL | 12 refills | Status: DC

## 2017-09-30 ENCOUNTER — Encounter: Payer: Self-pay | Admitting: Physician Assistant

## 2017-10-03 ENCOUNTER — Ambulatory Visit: Payer: No Typology Code available for payment source | Admitting: Physician Assistant

## 2017-10-03 ENCOUNTER — Telehealth: Payer: Self-pay | Admitting: Physician Assistant

## 2017-10-03 NOTE — Telephone Encounter (Signed)
Received FMLA form via fax, placed in bin upfront with charge sheet.

## 2017-10-03 NOTE — Telephone Encounter (Signed)
FMLA paperwork in your folder for review and completion

## 2017-10-03 NOTE — Telephone Encounter (Signed)
FMLA completed and faxed.

## 2017-10-08 ENCOUNTER — Encounter: Payer: Self-pay | Admitting: Physician Assistant

## 2017-10-08 NOTE — Telephone Encounter (Signed)
We received FMLA forms via fax, I have placed them in the bin up front with the charge sheet.

## 2017-10-13 ENCOUNTER — Ambulatory Visit: Payer: No Typology Code available for payment source | Admitting: Physician Assistant

## 2017-10-23 MED FILL — ESCITALOPRAM 10 MG TABLET: 10 | 30 days supply | Qty: 30 | Fill #0

## 2017-10-24 ENCOUNTER — Other Ambulatory Visit: Payer: Self-pay | Admitting: Physician Assistant

## 2017-10-24 DIAGNOSIS — K219 Gastro-esophageal reflux disease without esophagitis: Secondary | ICD-10-CM

## 2017-10-24 MED FILL — PROMETHAZINE 25 MG TABLET: 25 | 7 days supply | Qty: 20 | Fill #0

## 2017-10-24 MED FILL — diazePAM 5 MG TABS: 5 | 30 days supply | Qty: 60 | Fill #1

## 2017-10-24 MED FILL — ATORVASTATIN CALCIUM 20 MG: 20 | 30 days supply | Qty: 30 | Fill #2

## 2017-10-24 MED FILL — LEVOCETIRIZINE 5 MG TABLET: 5 | 30 days supply | Qty: 30 | Fill #2

## 2017-10-24 MED FILL — OMEPRAZOLE 40 MG CPDR: 40 | 30 days supply | Qty: 30 | Fill #0

## 2017-10-24 MED FILL — traZODone HCL 50 MG TABS: 50 | 30 days supply | Qty: 30 | Fill #1

## 2017-11-03 ENCOUNTER — Ambulatory Visit (INDEPENDENT_AMBULATORY_CARE_PROVIDER_SITE_OTHER): Payer: Self-pay | Admitting: Family Medicine

## 2017-11-03 DIAGNOSIS — Z23 Encounter for immunization: Secondary | ICD-10-CM

## 2017-11-03 NOTE — Progress Notes (Signed)
Pt presents here today for visit to receive Influenza vaccine. Allergies reviewed, vaccine given, vaccine information statement provided, tolerated well.   

## 2017-11-13 ENCOUNTER — Other Ambulatory Visit: Payer: Self-pay | Admitting: Physician Assistant

## 2017-11-13 MED FILL — VENTOLIN HFA 90 MCG INHALER: 108 (90 BAS | 25 days supply | Qty: 18 | Fill #0

## 2017-11-14 ENCOUNTER — Telehealth: Payer: No Typology Code available for payment source | Admitting: Nurse Practitioner

## 2017-11-14 DIAGNOSIS — R05 Cough: Secondary | ICD-10-CM | POA: Diagnosis not present

## 2017-11-14 DIAGNOSIS — R059 Cough, unspecified: Secondary | ICD-10-CM

## 2017-11-14 MED ORDER — BENZONATATE 100 MG PO CAPS: 100.0000 mg | ORAL_CAPSULE | Freq: Three times a day (TID) | ORAL | 0 refills | Status: DC | PRN

## 2017-11-14 MED ORDER — AZITHROMYCIN 250 MG PO TABS: ORAL_TABLET | ORAL | 0 refills | Status: DC

## 2017-11-14 NOTE — Progress Notes (Signed)

## 2017-11-24 ENCOUNTER — Other Ambulatory Visit: Payer: Self-pay | Admitting: Physician Assistant

## 2017-11-24 MED FILL — ESCITALOPRAM 10 MG TABLET: 10 | 30 days supply | Qty: 30 | Fill #1

## 2017-11-24 MED FILL — OMEPRAZOLE 40 MG CPDR: 40 | 30 days supply | Qty: 30 | Fill #1

## 2017-11-24 MED FILL — diazePAM 5 MG TABS: 5 | 30 days supply | Qty: 60 | Fill #0

## 2017-11-24 MED FILL — LEVOCETIRIZINE 5 MG TABLET: 5 | 30 days supply | Qty: 30 | Fill #3

## 2017-11-24 MED FILL — BENZONATATE 100 MG CAPS: 100 | 7 days supply | Qty: 20 | Fill #0

## 2017-11-24 MED FILL — VASCEPA 1 GM CAPSULE: 1 | 90 days supply | Qty: 360 | Fill #1

## 2017-11-26 MED FILL — AZITHROMYCIN 250 MG TABLET: 250 | 5 days supply | Qty: 6 | Fill #0

## 2017-12-09 MED FILL — TRIAMTERENE/HCTZ 37.5/25 CP: 37.5-25 | 90 days supply | Qty: 90 | Fill #1

## 2017-12-09 MED FILL — ATORVASTATIN CALCIUM 20 MG: 20 | 30 days supply | Qty: 30 | Fill #3

## 2017-12-15 ENCOUNTER — Other Ambulatory Visit: Payer: Self-pay | Admitting: Physician Assistant

## 2017-12-15 MED FILL — traZODone HCL 50 MG TABS: 50 | 30 days supply | Qty: 30 | Fill #2

## 2017-12-15 MED FILL — PROMETHAZINE 25 MG TABLET: 25 | 7 days supply | Qty: 20 | Fill #0

## 2017-12-15 MED FILL — MONTELUKAST SOD 10 MG TAB: 10 | 90 days supply | Qty: 90 | Fill #2

## 2017-12-17 ENCOUNTER — Ambulatory Visit (INDEPENDENT_AMBULATORY_CARE_PROVIDER_SITE_OTHER): Payer: Self-pay | Admitting: Nurse Practitioner

## 2017-12-17 VITALS — BP 130/90 | HR 93 | Temp 97.4°F | Wt 226.2 lb

## 2017-12-17 DIAGNOSIS — J029 Acute pharyngitis, unspecified: Secondary | ICD-10-CM

## 2017-12-17 DIAGNOSIS — J309 Allergic rhinitis, unspecified: Secondary | ICD-10-CM

## 2017-12-17 DIAGNOSIS — R0982 Postnasal drip: Secondary | ICD-10-CM

## 2017-12-17 LAB — POCT INFLUENZA A/B
INFLUENZA B, POC: NEGATIVE
Influenza A, POC: NEGATIVE

## 2017-12-17 LAB — POCT RAPID STREP A (OFFICE): RAPID STREP A SCREEN: NEGATIVE

## 2017-12-17 MED ORDER — FLUTICASONE PROPIONATE 50 MCG/ACT NA SUSP: 2.0000 | Freq: Every day | NASAL | 0 refills | Status: DC

## 2017-12-17 MED ORDER — BENZONATATE 200 MG PO CAPS: 200.0000 mg | ORAL_CAPSULE | Freq: Three times a day (TID) | ORAL | 0 refills | Status: AC | PRN

## 2017-12-17 NOTE — Progress Notes (Signed)
Subjective:    Patient ID: Steve Jacobs, male    DOB: 1985-01-28, 32 y.o.   MRN: [id number]  The patient is a 32 year old male who presents today for complaints of throat pain or discomfort.  The patient rates his pain 5/10 at present.  Patient states this is normally the pain level it has been since the onset.  Patient states symptoms started about 5 to 6 days ago.  The patient states "I think I have drainage, and it feels like a tickle in my throat."  The patient denies fever, chills, nasal congestion, but does have a dry cough as a result of the postnasal drip.  The patient does have a history of seasonal allergies for which he takes Xyzal and Singulair.  The patient has also been performing salt water gargles for his throat comfort.   Past Medical History:  Diagnosis Date  . Asthma   . Chicken pox   . Depression    Resolved  . Environmental and seasonal allergies   . Essential hypertension, benign 08/03/2013  . Hyperlipidemia   . Meniere's disease   . Thoracic outlet syndrome     Review of Systems  Constitutional: Negative.   HENT: Positive for ear pain, postnasal drip and sore throat. Negative for ear discharge, sinus pressure and sinus pain.   Eyes: Negative.   Respiratory: Positive for cough.   Cardiovascular: Negative.   Skin: Negative.   Allergic/Immunologic: Positive for environmental allergies.  Neurological: Negative.       Objective:   Physical Exam  Constitutional: He is oriented to person, place, and time. He appears well-developed and well-nourished. He does not appear ill.  HENT:  Head: Normocephalic.  Right Ear: Tympanic membrane and ear canal normal.  Left Ear: Tympanic membrane and ear canal normal.  Mouth/Throat: Uvula is midline and mucous membranes are normal. No uvula swelling. Posterior oropharyngeal erythema present. No oropharyngeal exudate or posterior oropharyngeal edema. Tonsils are 0 on the right. Tonsils are 0 on the left.  Eyes: Pupils are  equal, round, and reactive to light.  Neck: Normal range of motion. Neck supple. No thyromegaly present.  Cardiovascular: Normal rate, regular rhythm and normal heart sounds.  Pulmonary/Chest: Effort normal and breath sounds normal. He has no wheezes.  Abdominal: Soft. Bowel sounds are normal.  Lymphadenopathy:    He has no cervical adenopathy.  Neurological: He is alert and oriented to person, place, and time.  No cranial nerve deficits  Skin: Skin is dry.  Psychiatric: He has a normal mood and affect.  Vitals reviewed.     Assessment & Plan:  Exam findings, diagnosis etiology and medication use and indications reviewed with patient. Follow- Up and discharge instructions provided. No emergent/urgent issues found on exam.  Discussed with patient that his symptoms appear to be related to an allergic rhinitis.  Patient has no other symptoms to include sinus pressure, fever, purulent nasal drainage, or a long duration of symptoms.  Informed patient that he is also negative for strep.  We will attempt symptomatic treatment to help with the patient's postnasal drip to include fluticasone and benzonatate to help suppress his cough.  Due to the patient's comorbid illnesses, patient and this provider decided to try a nasal steroid to see if this will help with his postnasal drip.  Also informed the patient that he could also add an additional antihistamine such as Benadryl at bedtime.  Informed patient that the Benadryl and the Flonase should also help his ear congestion.  Patient education was provided. Patient verbalized understanding of information provided and agrees with plan of care (POC), all questions answered. The patient is advised to call or return to clinic if condition does not see an improvement in symptoms, or to seek the care of the closest emergency department if condition worsens with the above plan.   1. Sore throat  - POCT rapid strep A-negative  2. Allergic rhinitis with postnasal  drip  - fluticasone (FLONASE) 50 MCG/ACT nasal spray; Place 2 sprays into both nostrils daily for 10 days.  Dispense: 16 g; Refill: 0 - benzonatate (TESSALON) 200 MG capsule; Take 1 capsule (200 mg total) by mouth 3 (three) times daily as needed for up to 10 days for cough.  Dispense: 30 capsule; Refill: 0 -Take medication as prescribed. -Ibuprofen or Tylenol for pain, fever, or general discomfort. -Increase fluids. -Sleep elevated on at least 2 pillows at bedtime to help with cough. -Use a humidifier or vaporizer when at home and during sleep to help with cough. -Continue warm saltwater gargles for throat discomfort. -May use a teaspoon of honey or over-the-counter cough drops to help with cough. -May use Benadryl 25mg  at bedtime if needed. -Follow-up if symptoms do not improve.

## 2017-12-17 NOTE — Patient Instructions (Addendum)
Allergic Rhinitis, Adult -Take medication as prescribed. -Ibuprofen or Tylenol for pain, fever, or general discomfort. -Increase fluids. -Sleep elevated on at least 2 pillows at bedtime to help with cough. -Use a humidifier or vaporizer when at home and during sleep to help with cough. -Continue warm saltwater gargles for throat discomfort. -May use a teaspoon of honey or over-the-counter cough drops to help with cough. -May use Benadryl 25mg  at bedtime if needed. -Follow-up if symptoms do not improve.  Allergic rhinitis is an allergic reaction that affects the mucous membrane inside the nose. It causes sneezing, a runny or stuffy nose, and the feeling of mucus going down the back of the throat (postnasal drip). Allergic rhinitis can be mild to severe. There are two types of allergic rhinitis:  Seasonal. This type is also called hay fever. It happens only during certain seasons.  Perennial. This type can happen at any time of the year.  What are the causes? This condition happens when the body's defense system (immune system) responds to certain harmless substances called allergens as though they were germs.  Seasonal allergic rhinitis is triggered by pollen, which can come from grasses, trees, and weeds. Perennial allergic rhinitis may be caused by:  House dust mites.  Pet dander.  Mold spores.  What are the signs or symptoms? Symptoms of this condition include:  Sneezing.  Runny or stuffy nose (nasal congestion).  Postnasal drip.  Itchy nose.  Tearing of the eyes.  Trouble sleeping.  Daytime sleepiness.  How is this diagnosed? This condition may be diagnosed based on:  Your medical history.  A physical exam.  Tests to check for related conditions, such as: ? Asthma. ? Pink eye. ? Ear infection. ? Upper respiratory infection.  Tests to find out which allergens trigger your symptoms. These may include skin or blood tests.  How is this treated? There is no  cure for this condition, but treatment can help control symptoms. Treatment may include:  Taking medicines that block allergy symptoms, such as antihistamines. Medicine may be given as a shot, nasal spray, or pill.  Avoiding the allergen.  Desensitization. This treatment involves getting ongoing shots until your body becomes less sensitive to the allergen. This treatment may be done if other treatments do not help.  If taking medicine and avoiding the allergen does not work, new, stronger medicines may be prescribed.  Follow these instructions at home:  Find out what you are allergic to. Common allergens include smoke, dust, and pollen.  Avoid the things you are allergic to. These are some things you can do to help avoid allergens: ? Replace carpet with wood, tile, or vinyl flooring. Carpet can trap dander and dust. ? Do not smoke. Do not allow smoking in your home. ? Change your heating and air conditioning filter at least once a month. ? During allergy season:  Keep windows closed as much as possible.  Plan outdoor activities when pollen counts are lowest. This is usually during the evening hours.  When coming indoors, change clothing and shower before sitting on furniture or bedding.  Take over-the-counter and prescription medicines only as told by your health care provider.  Keep all follow-up visits as told by your health care provider. This is important. Contact a health care provider if:  You have a fever.  You develop a persistent cough.  You make whistling sounds when you breathe (you wheeze).  Your symptoms interfere with your normal daily activities. Get help right away if:  You  have shortness of breath. Summary  This condition can be managed by taking medicines as directed and avoiding allergens.  Contact your health care provider if you develop a persistent cough or fever.  During allergy season, keep windows closed as much as possible. This information is  not intended to replace advice given to you by your health care provider. Make sure you discuss any questions you have with your health care provider. Document Released: 09/25/2000 Document Revised: 02/08/2016 Document Reviewed: 02/08/2016 Elsevier Interactive Patient Education  2018 [employer]  Postnasal Drip Postnasal drip is the feeling of mucus going down the back of your throat. Mucus is a slimy substance that moistens and cleans your nose and throat, as well as the air pockets in face bones near your forehead and cheeks (sinuses). [place] amounts of mucus pass from your nose and sinuses down the back of your throat all the time. This is normal. When you produce too much mucus or the mucus gets too thick, you can feel it. Some common causes of postnasal drip include:  Having more mucus because of: ? A cold or the flu. ? Allergies. ? Cold air. ? Certain medicines.  Having more mucus that is thicker because of: ? A sinus or nasal infection. ? Dry air. ? A food allergy.  Follow these instructions at home: Relieving discomfort  Gargle with a salt-water mixture 3-4 times a day or as needed. To make a salt-water mixture, completely dissolve -1 tsp of salt in 1 cup of warm water.  If the air in your home is dry, use a humidifier to add moisture to the air.  Use a saline spray or container (neti pot) to flush out the nose (nasal irrigation). These methods can help clear away mucus and keep the nasal passages moist. General instructions  Take over-the-counter and prescription medicines only as told by your health care provider.  Follow instructions from your health care provider about eating or drinking restrictions. You may need to avoid caffeine.  Avoid things that you know you are allergic to (allergens), like dust, mold, pollen, pets, or certain foods.  Drink enough fluid to keep your urine pale yellow.  Keep all follow-up visits as told by your health care provider. This is  important. Contact a health care provider if:  You have a fever.  You have a sore throat.  You have difficulty swallowing.  You have headache.  You have sinus pain.  You have a cough that does not go away.  The mucus from your nose becomes thick and is green or yellow in color.  You have cold or flu symptoms that last more than 10 days. Summary  Postnasal drip is the feeling of mucus going down the back of your throat.  If your health care provider approves, use nasal irrigation or a nasal spray 2?4 times a day.  Avoid things that you know you are allergic to (allergens), like dust, mold, pollen, pets, or certain foods. This information is not intended to replace advice given to you by your health care provider. Make sure you discuss any questions you have with your health care provider. Document Released: 04/15/2016 Document Revised: 04/15/2016 Document Reviewed: 04/15/2016 Elsevier Interactive Patient Education  [employer].

## 2017-12-18 MED FILL — BENZONATATE 200 MG CAPS: 200 | 10 days supply | Qty: 30 | Fill #0

## 2017-12-18 MED FILL — FLUTICASONE PROP 50 MCG SPR: 50 | 30 days supply | Qty: 16 | Fill #0

## 2017-12-22 ENCOUNTER — Other Ambulatory Visit: Payer: Self-pay | Admitting: Physician Assistant

## 2017-12-22 MED FILL — FENOFIBRATE 145 MG TABLET: 145 | 90 days supply | Qty: 90 | Fill #1

## 2017-12-22 MED FILL — ESCITALOPRAM 10 MG TABLET: 10 | 30 days supply | Qty: 30 | Fill #2

## 2017-12-22 MED FILL — METOPROLOL SUCCINATE ER 25: 25 | 90 days supply | Qty: 90 | Fill #0

## 2017-12-22 MED FILL — diazePAM 5 MG TABS: 5 | 30 days supply | Qty: 60 | Fill #1

## 2017-12-22 MED FILL — FINASTERIDE 1 MG TABLET: 1 | 90 days supply | Qty: 90 | Fill #2

## 2018-01-09 ENCOUNTER — Telehealth: Payer: No Typology Code available for payment source | Admitting: Family

## 2018-01-09 DIAGNOSIS — M545 Low back pain, unspecified: Secondary | ICD-10-CM

## 2018-01-09 MED ORDER — BACLOFEN 10 MG PO TABS: 10.0000 mg | ORAL_TABLET | Freq: Three times a day (TID) | ORAL | 0 refills | Status: DC | PRN

## 2018-01-09 MED FILL — BACLOFEN 10 MG TABLET: 10 | 10 days supply | Qty: 30 | Fill #0

## 2018-01-09 NOTE — Progress Notes (Signed)
Thank you for the details you included in the comment boxes. Those details are very helpful in determining the best course of treatment for you and help [place] to provide the best care.  We are sorry that you are not feeling well.  Here is how we plan to help!  Based on what you have shared with me it looks like you mostly have acute back pain.  Acute back pain is defined as musculoskeletal pain that can resolve in 1-3 weeks with conservative treatment.  I have prescribed over-the-counter ibuprofen (in moderate doses only at 200mg  and no more than 4 times daily due to the severe risk of GI bleed with your Lexapro SSRI) as well as Baclofen 10 mg every eight hours as needed which is a muscle relaxer  Some patients experience stomach irritation or in increased heartburn with anti-inflammatory drugs.  Please keep in mind that muscle relaxer's can cause fatigue and should not be taken while at work or driving.  Back pain is very common.  The pain often gets better over time.  The cause of back pain is usually not dangerous.  Most people can learn to manage their back pain on their own.  Home Care  Stay active.  Start with short walks on flat ground if you can.  Try to walk farther each day.  Do not sit, drive or stand in one place for more than 30 minutes.  Do not stay in bed.  Do not avoid exercise or work.  Activity can help your back heal faster.  Be careful when you bend or lift an object.  Bend at your knees, keep the object close to you, and do not twist.  Sleep on a firm mattress.  Lie on your side, and bend your knees.  If you lie on your back, put a pillow under your knees.  Only take medicines as told by your doctor.  Put ice on the injured area.  Put ice in a plastic bag  Place a towel between your skin and the bag  Leave the ice on for 15-20 minutes, 3-4 times a day for the first 2-3 days. 210 After that, you can switch between ice and heat packs.  Ask your doctor about back  exercises or massage.  Avoid feeling anxious or stressed.  Find good ways to deal with stress, such as exercise.  Get Help Right Way If:  Your pain does not go away with rest or medicine.  Your pain does not go away in 1 week.  You have new problems.  You do not feel well.  The pain spreads into your legs.  You cannot control when you poop (bowel movement) or pee (urinate)  You feel sick to your stomach (nauseous) or throw up (vomit)  You have belly (abdominal) pain.  You feel like you may pass out (faint).  If you develop a fever.  Make Sure you:  Understand these instructions.  Will watch your condition  Will get help right away if you are not doing well or get worse.  Your e-visit answers were reviewed by a board certified advanced clinical practitioner to complete your personal care plan.  Depending on the condition, your plan could have included both over the counter or prescription medications.  If there is a problem please reply  once you have received a response from your provider.  Your safety is important to [place].  If you have drug allergies check your prescription carefully.    You can  use MyChart to ask questions about today's visit, request a non-urgent call back, or ask for a work or school excuse for 24 hours related to this e-Visit. If it has been greater than 24 hours you will need to follow up with your provider, or enter a new e-Visit to address those concerns.  You will get an e-mail in the next two days asking about your experience.  I hope that your e-visit has been valuable and will speed your recovery. Thank you for using e-visits.

## 2018-01-12 MED FILL — OMEPRAZOLE 40 MG CPDR: 40 | 30 days supply | Qty: 30 | Fill #2

## 2018-01-12 MED FILL — LEVOCETIRIZINE 5 MG TABLET: 5 | 30 days supply | Qty: 30 | Fill #4

## 2018-01-26 ENCOUNTER — Ambulatory Visit: Payer: No Typology Code available for payment source | Admitting: Physician Assistant

## 2018-01-27 ENCOUNTER — Other Ambulatory Visit: Payer: Self-pay

## 2018-01-27 ENCOUNTER — Encounter: Payer: Self-pay | Admitting: Physician Assistant

## 2018-01-27 ENCOUNTER — Ambulatory Visit (INDEPENDENT_AMBULATORY_CARE_PROVIDER_SITE_OTHER): Payer: No Typology Code available for payment source | Admitting: Physician Assistant

## 2018-01-27 VITALS — BP 120/80 | HR 106 | Temp 97.8°F | Resp 14 | Ht 70.0 in | Wt 225.0 lb

## 2018-01-27 DIAGNOSIS — H8109 Meniere's disease, unspecified ear: Secondary | ICD-10-CM | POA: Diagnosis not present

## 2018-01-27 DIAGNOSIS — J208 Acute bronchitis due to other specified organisms: Secondary | ICD-10-CM

## 2018-01-27 MED ORDER — BENZONATATE 100 MG PO CAPS: 100.0000 mg | ORAL_CAPSULE | Freq: Two times a day (BID) | ORAL | 0 refills | Status: DC | PRN

## 2018-01-27 MED FILL — BENZONATATE 100 MG CAPS: 100 | 10 days supply | Qty: 20 | Fill #0

## 2018-01-27 NOTE — Progress Notes (Signed)
Patient presents to clinic today c/o 6 days of chest congestion and cough. Was initially productive of green sputum but now has become dry. Notes clear nasal drainage. Denies fever, chills. Had some loose stools that are resolving. Meniere's exacerbation- nausea, vomiting, dizziness, before coughing began.  Needs to discuss [place] for the month.  Past Medical History:  Diagnosis Date  . Asthma   . Chicken pox   . Depression    Resolved  . Environmental and seasonal allergies   . Essential hypertension, benign 08/03/2013  . Hyperlipidemia   . Meniere's disease   . Thoracic outlet syndrome     Current Outpatient Medications on File Prior to Visit  Medication Sig Dispense Refill  . albuterol (VENTOLIN HFA) 108 (90 Base) MCG/ACT inhaler INHALE 2 PUFFS BY MOUTH EVERY 6 HOURS AS NEEDED FOR WHEEZING 18 g 0  . atorvastatin (LIPITOR) 20 MG tablet TAKE 1 TABLET BY MOUTH DAILY 30 tablet 5  . baclofen (LIORESAL) 10 MG tablet Take 1 tablet (10 mg total) by mouth every 8 (eight) hours as needed for muscle spasms. 30 each 0  . budesonide-formoterol (SYMBICORT) 80-4.5 MCG/ACT inhaler Inhale 2 puffs into the lungs 2 (two) times daily. 1 Inhaler 6  . diazepam (VALIUM) 5 MG tablet TAKE 1 TABLET BY MOUTH TWICE DAILY 60 tablet 1  . diphenhydrAMINE (BENADRYL) 25 MG tablet Take 25 mg by mouth every 6 (six) hours as needed for itching or allergies.    Marland Kitchen escitalopram (LEXAPRO) 10 MG tablet Take 1 tablet (10 mg total) by mouth daily. 30 tablet 3  . fenofibrate (TRICOR) 145 MG tablet TAKE 1 TABLET BY MOUTH DAILY. 90 tablet 1  . finasteride (PROPECIA) 1 MG tablet TAKE 1 TABLET BY MOUTH DAILY. 90 tablet 2  . levocetirizine (XYZAL) 5 MG tablet TAKE 1 TABLET (5 MG TOTAL) BY MOUTH EVERY EVENING. 30 tablet 5  . metoprolol succinate (TOPROL-XL) 25 MG 24 hr tablet TAKE 1 TABLET BY MOUTH DAILY. 90 tablet 1  . montelukast (SINGULAIR) 10 MG tablet TAKE 1 TABLET BY MOUTH AT BEDTIME. 90 tablet 2  . Multiple Vitamin  (MULTIVITAMIN WITH MINERALS) TABS tablet Take 1 tablet by mouth daily.    Marland Kitchen omeprazole (PRILOSEC) 40 MG capsule TAKE 1 CAPSULE BY MOUTH DAILY. 30 capsule 3  . promethazine (PHENERGAN) 25 MG tablet TAKE 1 TABLET (25 MG TOTAL) BY MOUTH EVERY 8 HOURS AS NEEDED FOR NAUSEA OR VOMITING. 20 tablet 0  . scopolamine (TRANSDERM-SCOP) 1 MG/3DAYS Place 1 patch (1.5 mg total) onto the skin every 3 (three) days. 10 patch 12  . thiamine (VITAMIN B-1) 100 MG tablet Take 100 mg by mouth daily.    . traZODone (DESYREL) 50 MG tablet TAKE 1 TABLET BY MOUTH AT BEDTIME AS NEEDED FOR SLEEP. 30 tablet 3  . triamterene-hydrochlorothiazide (DYAZIDE) 37.5-25 MG capsule TAKE 1 CAPSULE BY MOUTH DAILY. 90 capsule 1  . VASCEPA 1 g CAPS TAKE 2 CAPSULES BY MOUTH TWICE A DAY 360 capsule 1  . fluticasone (FLONASE) 50 MCG/ACT nasal spray Place 2 sprays into both nostrils daily for 10 days. (Patient not taking: Reported on 01/27/2018) 16 g 0   No current facility-administered medications on file prior to visit.     Allergies  Allergen Reactions  . Augmentin [Amoxicillin-Pot Clavulanate]   . Belsomra [Suvorexant] Other (See Comments)    nightmares  . Percocet [Oxycodone-Acetaminophen] Nausea And Vomiting    Family History  Problem Relation Age of Onset  . Hyperlipidemia Father  Living  . Stroke Father   . Hypertension Father   . Alcohol abuse Mother        Living  . Diabetes Mellitus II Maternal Grandfather   . Hypertension Maternal Grandfather   . Heart failure Maternal Grandfather   . Kidney disease Maternal Grandfather   . Heart disease Maternal Grandmother   . Melanoma Paternal Grandmother   . Heart attack Paternal Grandfather   . Migraines Brother   . Healthy Brother        x2  . Drug abuse Sister        Died of Overdose    Social History   Socioeconomic History  . Marital status: Single    Spouse name: Not on file  . Number of children: Not on file  . Years of education: Not on file  . Highest  education level: Not on file  Occupational History  . Not on file  Social Needs  . Financial resource strain: Not on file  . Food insecurity:    Worry: Not on file    Inability: Not on file  . Transportation needs:    Medical: Not on file    Non-medical: Not on file  Tobacco Use  . Smoking status: Never Smoker  . Smokeless tobacco: Never Used  Substance and Sexual Activity  . Alcohol use: Yes    Alcohol/week: 0.0 standard drinks    Comment: 2-3 beers nightly  . Drug use: No  . Sexual activity: Yes    Birth control/protection: None    Comment: male - 1 partner  Lifestyle  . Physical activity:    Days per week: Not on file    Minutes per session: Not on file  . Stress: Not on file  Relationships  . Social connections:    Talks on phone: Not on file    Gets together: Not on file    Attends religious service: Not on file    Active member of club or organization: Not on file    Attends meetings of clubs or organizations: Not on file    Relationship status: Not on file  Other Topics Concern  . Not on file  Social History Narrative  . Not on file   Review of Systems - See HPI.  All other ROS are negative.  BP 120/80   Pulse (!) 106   Temp 97.8 F (36.6 C) (Oral)   Resp 14   Ht 5\' 10"  (1.778 m)   Wt 225 lb (102.1 kg)   SpO2 98%   BMI 32.28 kg/m   Physical Exam HENT:     Right Ear: Hearing and ear canal normal. Tympanic membrane is erythematous and retracted.     Left Ear: Hearing, tympanic membrane and ear canal normal.     Mouth/Throat:     Pharynx: Posterior oropharyngeal erythema present.  Neck:     Musculoskeletal: No muscular tenderness (under left ear).  Cardiovascular:     Rate and Rhythm: Normal rate and regular rhythm.     Heart sounds: Normal heart sounds.  Pulmonary:     Effort: Pulmonary effort is normal.     Breath sounds: Normal breath sounds.  Neurological:     Mental Status: He is alert.    Recent Results (from the past 2160 hour(s))    POCT Influenza A/B     Status: Normal   Collection Time: 12/17/17  6:13 PM  Result Value Ref Range   Influenza A, POC Negative Negative   Influenza B,  POC Negative Negative  POCT rapid strep A     Status: Normal   Collection Time: 12/17/17  6:13 PM  Result Value Ref Range   Rapid Strep A Screen Negative Negative    Assessment/Plan: 1. Meniere's disease, unspecified laterality Flare secondary to viral bronchitis. Continue chronic measures. Phenergan refilled. Keep low-salt diet. Follow-up with ENT.  2. Viral bronchitis Moderate initial symptoms that have improved daily over the past few days. Just mild lingering cough. Afebrile. Exam unremarkable except for mild oropharyngeal erythema. Start Tessalon as directed for cough. Supportive measures and OTC medications reviewed. Follow-up for any worsening of symptoms from this point onward.  - benzonatate (TESSALON) 100 MG capsule; Take 1 capsule (100 mg total) by mouth 2 (two) times daily as needed for cough.  Dispense: 20 capsule; Refill: 0   Leeanne Rio, PA-C

## 2018-01-27 NOTE — Patient Instructions (Signed)
Please keep well-hydrated and get plenty of rest.  Continue the Mucinex twice daily. Start the [place] as well for cough. Continue chronic medications for the meniere's. Schedule a follow-up appointment with ENT as you need repeat hearing screen as well as office visit.  Watch symptoms closely for the next 48 hours. They should only continue to improve and resolve. If you note any worsening or new symptoms, call or message me immediately.

## 2018-02-02 ENCOUNTER — Encounter: Payer: Self-pay | Admitting: Physician Assistant

## 2018-02-02 ENCOUNTER — Other Ambulatory Visit: Payer: Self-pay | Admitting: Physician Assistant

## 2018-02-02 MED FILL — ATORVASTATIN CALCIUM 20 MG: 20 | 30 days supply | Qty: 30 | Fill #4

## 2018-02-02 MED FILL — diazePAM 5 MG TABS: 5 | 30 days supply | Qty: 60 | Fill #0

## 2018-02-02 MED FILL — ESCITALOPRAM 10 MG TABLET: 10 | 30 days supply | Qty: 30 | Fill #3

## 2018-02-02 MED FILL — PROMETHAZINE 25 MG TABLET: 25 | 7 days supply | Qty: 20 | Fill #0

## 2018-02-04 MED FILL — VENTOLIN HFA 90 MCG INHALER: 108 (90 BAS | 25 days supply | Qty: 18 | Fill #1

## 2018-02-08 ENCOUNTER — Encounter: Payer: Self-pay | Admitting: Physician Assistant

## 2018-02-11 MED FILL — LEVOCETIRIZINE 5 MG TABLET: 5 | 30 days supply | Qty: 30 | Fill #5

## 2018-03-09 ENCOUNTER — Encounter: Payer: Self-pay | Admitting: Physician Assistant

## 2018-03-09 MED ORDER — OMEPRAZOLE 20 MG PO CPDR: 20.0000 mg | DELAYED_RELEASE_CAPSULE | Freq: Every day | ORAL | 1 refills | Status: DC

## 2018-03-09 MED FILL — traZODone HCL 50 MG TABS: 50 | 30 days supply | Qty: 30 | Fill #3

## 2018-03-09 MED FILL — OMEPRAZOLE 20 MG CPDR: 20 | 30 days supply | Qty: 30 | Fill #0 | Status: TO

## 2018-03-09 MED FILL — diazePAM 5 MG TABS: 5 | 30 days supply | Qty: 60 | Fill #1

## 2018-03-11 ENCOUNTER — Encounter: Payer: No Typology Code available for payment source | Admitting: Physician Assistant

## 2018-03-13 ENCOUNTER — Encounter: Payer: Self-pay | Admitting: Physician Assistant

## 2018-03-16 MED ORDER — ALBUTEROL SULFATE HFA 108 (90 BASE) MCG/ACT IN AERS: INHALATION_SPRAY | RESPIRATORY_TRACT | 0 refills | Status: DC

## 2018-03-16 MED FILL — VENTOLIN HFA 90 MCG INHALER: 108 (90 BAS | 75 days supply | Qty: 54 | Fill #0

## 2018-03-17 ENCOUNTER — Other Ambulatory Visit: Payer: Self-pay | Admitting: Physician Assistant

## 2018-03-17 MED FILL — LEVOCETIRIZINE 5 MG TABLET: 5 | 30 days supply | Qty: 30 | Fill #0 | Status: TO

## 2018-03-18 ENCOUNTER — Other Ambulatory Visit: Payer: Self-pay | Admitting: Physician Assistant

## 2018-03-18 ENCOUNTER — Encounter: Payer: No Typology Code available for payment source | Admitting: Physician Assistant

## 2018-03-18 MED FILL — ESCITALOPRAM 10 MG TABLET: 10 | 30 days supply | Qty: 30 | Fill #0 | Status: TO

## 2018-03-24 MED FILL — ATORVASTATIN 20 MG TABLET: 20 | 30 days supply | Qty: 30 | Fill #5

## 2018-04-06 ENCOUNTER — Encounter: Payer: Self-pay | Admitting: Physician Assistant

## 2018-04-06 ENCOUNTER — Other Ambulatory Visit: Payer: Self-pay | Admitting: Physician Assistant

## 2018-04-06 MED ORDER — LEVOCETIRIZINE DIHYDROCHLORIDE 5 MG PO TABS: 5.0000 mg | ORAL_TABLET | Freq: Every evening | ORAL | 0 refills | Status: DC

## 2018-04-06 MED ORDER — OMEPRAZOLE 20 MG PO CPDR: 20.0000 mg | DELAYED_RELEASE_CAPSULE | Freq: Every day | ORAL | 0 refills | Status: DC

## 2018-04-06 MED ORDER — ESCITALOPRAM OXALATE 10 MG PO TABS: 10.0000 mg | ORAL_TABLET | Freq: Every day | ORAL | 0 refills | Status: DC

## 2018-04-06 MED ORDER — TRIAMTERENE-HCTZ 37.5-25 MG PO CAPS: 1.0000 | ORAL_CAPSULE | Freq: Every day | ORAL | 0 refills | Status: DC

## 2018-04-06 MED ORDER — FINASTERIDE 1 MG PO TABS: 1.0000 mg | ORAL_TABLET | Freq: Every day | ORAL | 0 refills | Status: DC

## 2018-04-06 MED ORDER — MONTELUKAST SODIUM 10 MG PO TABS: 10.0000 mg | ORAL_TABLET | Freq: Every day | ORAL | 0 refills | Status: DC

## 2018-04-06 MED ORDER — TRAZODONE HCL 50 MG PO TABS: 50.0000 mg | ORAL_TABLET | Freq: Every evening | ORAL | 0 refills | Status: DC | PRN

## 2018-04-06 MED ORDER — METOPROLOL SUCCINATE ER 25 MG PO TB24: 25.0000 mg | ORAL_TABLET | Freq: Every day | ORAL | 0 refills | Status: DC

## 2018-04-06 MED ORDER — FENOFIBRATE 145 MG PO TABS: 145.0000 mg | ORAL_TABLET | Freq: Every day | ORAL | 0 refills | Status: DC

## 2018-04-06 MED FILL — ESCITALOPRAM 10 MG TABLET: 10 | 30 days supply | Qty: 30 | Fill #0

## 2018-04-06 MED FILL — OMEPRAZOLE 20 MG CPDR: 20 | 30 days supply | Qty: 30 | Fill #0

## 2018-04-06 MED FILL — LEVOCETIRIZINE 5 MG TABLET: 5 | 30 days supply | Qty: 30 | Fill #0

## 2018-04-07 ENCOUNTER — Other Ambulatory Visit: Payer: Self-pay | Admitting: Emergency Medicine

## 2018-04-07 MED ORDER — ATORVASTATIN CALCIUM 20 MG PO TABS: 20.0000 mg | ORAL_TABLET | Freq: Every day | ORAL | 1 refills | Status: DC

## 2018-04-07 MED ORDER — FINASTERIDE 1 MG PO TABS: 1.0000 mg | ORAL_TABLET | Freq: Every day | ORAL | 0 refills | Status: DC

## 2018-04-07 MED ORDER — ESCITALOPRAM OXALATE 10 MG PO TABS: 10.0000 mg | ORAL_TABLET | Freq: Every day | ORAL | 0 refills | Status: DC

## 2018-04-07 MED ORDER — TRIAMTERENE-HCTZ 37.5-25 MG PO CAPS: 1.0000 | ORAL_CAPSULE | Freq: Every day | ORAL | 0 refills | Status: DC

## 2018-04-07 MED ORDER — TRAZODONE HCL 50 MG PO TABS: 50.0000 mg | ORAL_TABLET | Freq: Every evening | ORAL | 0 refills | Status: DC | PRN

## 2018-04-07 MED ORDER — FENOFIBRATE 145 MG PO TABS: 145.0000 mg | ORAL_TABLET | Freq: Every day | ORAL | 0 refills | Status: DC

## 2018-04-07 MED ORDER — OMEPRAZOLE 20 MG PO CPDR: 20.0000 mg | DELAYED_RELEASE_CAPSULE | Freq: Every day | ORAL | 0 refills | Status: DC

## 2018-04-07 MED ORDER — METOPROLOL SUCCINATE ER 25 MG PO TB24: 25.0000 mg | ORAL_TABLET | Freq: Every day | ORAL | 0 refills | Status: DC

## 2018-04-07 MED ORDER — LEVOCETIRIZINE DIHYDROCHLORIDE 5 MG PO TABS: 5.0000 mg | ORAL_TABLET | Freq: Every evening | ORAL | 0 refills | Status: DC

## 2018-04-07 MED ORDER — MONTELUKAST SODIUM 10 MG PO TABS: 10.0000 mg | ORAL_TABLET | Freq: Every day | ORAL | 0 refills | Status: DC

## 2018-04-07 MED FILL — traZODone HCL 50 MG TABS: 50 | 90 days supply | Qty: 90 | Fill #0

## 2018-04-07 MED FILL — diazePAM 5 MG TABS: 5 | 30 days supply | Qty: 60 | Fill #0

## 2018-04-07 MED FILL — FENOFIBRATE 145 MG TABLET: 145 | 90 days supply | Qty: 90 | Fill #0

## 2018-04-07 MED FILL — TRIAMTERENE/HCTZ 37.5/25 CP: 37.5-25 | 90 days supply | Qty: 90 | Fill #0

## 2018-04-07 MED FILL — MONTELUKAST SOD 10 MG TAB: 10 | 90 days supply | Qty: 90 | Fill #0

## 2018-04-07 MED FILL — METOPROLOL SUCCINATE ER 25: 25 | 90 days supply | Qty: 90 | Fill #0

## 2018-04-07 NOTE — Telephone Encounter (Signed)
Diazepam last rx 02/02/18 #60 1 RF CSC: 01/15/17

## 2018-04-09 ENCOUNTER — Encounter: Payer: No Typology Code available for payment source | Admitting: Physician Assistant

## 2018-04-09 ENCOUNTER — Encounter: Payer: Self-pay | Admitting: Physician Assistant

## 2018-04-09 MED ORDER — BUDESONIDE-FORMOTEROL FUMARATE 80-4.5 MCG/ACT IN AERO: 2.0000 | INHALATION_SPRAY | Freq: Two times a day (BID) | RESPIRATORY_TRACT | 6 refills | Status: DC

## 2018-04-13 ENCOUNTER — Telehealth: Payer: No Typology Code available for payment source | Admitting: Family

## 2018-04-13 DIAGNOSIS — M5440 Lumbago with sciatica, unspecified side: Secondary | ICD-10-CM

## 2018-04-13 MED ORDER — CYCLOBENZAPRINE HCL 10 MG PO TABS: 10.0000 mg | ORAL_TABLET | Freq: Three times a day (TID) | ORAL | 0 refills | Status: DC | PRN

## 2018-04-13 NOTE — Progress Notes (Signed)

## 2018-04-16 MED FILL — SYMBICORT 80-4.5 MCG INH: 80-4.5 | 30 days supply | Qty: 10 | Fill #0

## 2018-04-16 MED FILL — ATORVASTATIN 20 MG TABLET: 20 | 90 days supply | Qty: 90 | Fill #0

## 2018-04-16 MED FILL — LEVOCETIRIZINE 5 MG TABLET: 5 | 90 days supply | Qty: 90 | Fill #0

## 2018-04-16 MED FILL — ESCITALOPRAM 10 MG TABLET: 10 | 90 days supply | Qty: 90 | Fill #0

## 2018-04-16 MED FILL — FINASTERIDE 1 MG TABLET: 1 | 90 days supply | Qty: 90 | Fill #0

## 2018-04-20 ENCOUNTER — Telehealth: Payer: Self-pay | Admitting: Physician Assistant

## 2018-04-20 DIAGNOSIS — Z0279 Encounter for issue of other medical certificate: Secondary | ICD-10-CM

## 2018-04-20 NOTE — Telephone Encounter (Signed)
Paperwork faxed and charge sheet given to patient

## 2018-04-20 NOTE — Telephone Encounter (Signed)
FMLA completed and given to front desk staff for charge and fax

## 2018-04-20 NOTE — Telephone Encounter (Signed)
FMLA paperwork in your folder.

## 2018-04-20 NOTE — Telephone Encounter (Signed)
I have placed FMLA forms from [employer] in the bin up front with a charge sheet.

## 2018-05-11 ENCOUNTER — Encounter: Payer: Self-pay | Admitting: Physician Assistant

## 2018-05-11 MED FILL — diazePAM 5 MG TABS: 5 | 30 days supply | Qty: 60 | Fill #1

## 2018-06-01 ENCOUNTER — Other Ambulatory Visit: Payer: Self-pay | Admitting: Physician Assistant

## 2018-06-01 MED FILL — VASCEPA 1 GM CAPSULE: 1 | 90 days supply | Qty: 360 | Fill #0

## 2018-06-09 ENCOUNTER — Other Ambulatory Visit: Payer: Self-pay | Admitting: Physician Assistant

## 2018-06-09 MED FILL — diazePAM 5 MG TABS: 5 | 30 days supply | Qty: 60 | Fill #0

## 2018-06-09 NOTE — Telephone Encounter (Signed)
Diazepam last rx 04/07/18 #60 1 RF LOV: 01/27/18 CSC:01/15/17 UDS:07/10/16 Upcoming appt 06/11/18

## 2018-06-10 ENCOUNTER — Encounter: Payer: Self-pay | Admitting: Physician Assistant

## 2018-06-11 ENCOUNTER — Ambulatory Visit (INDEPENDENT_AMBULATORY_CARE_PROVIDER_SITE_OTHER): Payer: No Typology Code available for payment source | Admitting: Physician Assistant

## 2018-06-11 ENCOUNTER — Other Ambulatory Visit: Payer: Self-pay

## 2018-06-11 ENCOUNTER — Encounter: Payer: Self-pay | Admitting: Physician Assistant

## 2018-06-11 ENCOUNTER — Other Ambulatory Visit (HOSPITAL_COMMUNITY)
Admission: RE | Admit: 2018-06-11 | Discharge: 2018-06-11 | Disposition: A | Discharge status: Home / Self Care | Payer: No Typology Code available for payment source | Source: Ambulatory Visit | Attending: Physician Assistant | Admitting: Physician Assistant

## 2018-06-11 VITALS — BP 108/80 | HR 92 | Temp 98.4°F | Resp 16 | Ht 70.0 in | Wt 211.0 lb

## 2018-06-11 DIAGNOSIS — Z0001 Encounter for general adult medical examination with abnormal findings: Secondary | ICD-10-CM

## 2018-06-11 DIAGNOSIS — I10 Essential (primary) hypertension: Secondary | ICD-10-CM | POA: Diagnosis not present

## 2018-06-11 DIAGNOSIS — Z23 Encounter for immunization: Secondary | ICD-10-CM

## 2018-06-11 DIAGNOSIS — F5101 Primary insomnia: Secondary | ICD-10-CM | POA: Diagnosis not present

## 2018-06-11 DIAGNOSIS — Z0000 Encounter for general adult medical examination without abnormal findings: Secondary | ICD-10-CM

## 2018-06-11 DIAGNOSIS — F329 Major depressive disorder, single episode, unspecified: Secondary | ICD-10-CM

## 2018-06-11 DIAGNOSIS — H8109 Meniere's disease, unspecified ear: Secondary | ICD-10-CM

## 2018-06-11 DIAGNOSIS — Z113 Encounter for screening for infections with a predominantly sexual mode of transmission: Secondary | ICD-10-CM | POA: Diagnosis not present

## 2018-06-11 DIAGNOSIS — F32A Depression, unspecified: Secondary | ICD-10-CM

## 2018-06-11 DIAGNOSIS — K219 Gastro-esophageal reflux disease without esophagitis: Secondary | ICD-10-CM

## 2018-06-11 LAB — CBC WITH DIFFERENTIAL/PLATELET
Basophils Absolute: 0.1 10*3/uL (ref 0.0–0.1)
Basophils Relative: 3.3 % — ABNORMAL HIGH (ref 0.0–3.0)
Eosinophils Absolute: 0.2 10*3/uL (ref 0.0–0.7)
Eosinophils Relative: 5 % (ref 0.0–5.0)
HCT: 44.8 % (ref 39.0–52.0)
Hemoglobin: 15.8 g/dL (ref 13.0–17.0)
Lymphocytes Relative: 43.9 % (ref 12.0–46.0)
Lymphs Abs: 1.4 10*3/uL (ref 0.7–4.0)
MCHC: 35.3 g/dL (ref 30.0–36.0)
MCV: 81 fl (ref 78.0–100.0)
Monocytes Absolute: 0.5 10*3/uL (ref 0.1–1.0)
Monocytes Relative: 13.9 % — ABNORMAL HIGH (ref 3.0–12.0)
Neutro Abs: 1.1 10*3/uL — ABNORMAL LOW (ref 1.4–7.7)
Neutrophils Relative %: 33.9 % — ABNORMAL LOW (ref 43.0–77.0)
Platelets: 240 10*3/uL (ref 150.0–400.0)
RBC: 5.53 Mil/uL (ref 4.22–5.81)
RDW: 13.4 % (ref 11.5–15.5)
WBC: 3.3 10*3/uL — ABNORMAL LOW (ref 4.0–10.5)

## 2018-06-11 LAB — COMPREHENSIVE METABOLIC PANEL
ALT: 18 U/L (ref 0–53)
AST: 19 U/L (ref 0–37)
Albumin: 5.1 g/dL (ref 3.5–5.2)
Alkaline Phosphatase: 43 U/L (ref 39–117)
BUN: 13 mg/dL (ref 6–23)
CO2: 29 mEq/L (ref 19–32)
Calcium: 10.1 mg/dL (ref 8.4–10.5)
Chloride: 96 mEq/L (ref 96–112)
Creatinine, Ser: 1.01 mg/dL (ref 0.40–1.50)
GFR: 85.13 mL/min (ref >60.00)
Glucose, Bld: 84 mg/dL (ref 70–99)
Potassium: 3.9 mEq/L (ref 3.5–5.1)
Sodium: 138 mEq/L (ref 135–145)
Total Bilirubin: 0.8 mg/dL (ref 0.2–1.2)
Total Protein: 7.7 g/dL (ref 6.0–8.3)

## 2018-06-11 LAB — LIPID PANEL
Cholesterol: 103 mg/dL (ref 0–200)
HDL: 34.1 mg/dL — ABNORMAL LOW (ref >39.00)
LDL Cholesterol: 33 mg/dL (ref 0–99)
NonHDL: 68.96
Total CHOL/HDL Ratio: 3
Triglycerides: 182 mg/dL — ABNORMAL HIGH (ref 0.0–149.0)
VLDL: 36.4 mg/dL (ref 0.0–40.0)

## 2018-06-11 LAB — HEMOGLOBIN A1C: Hgb A1c MFr Bld: 5.5 % (ref 4.6–6.5)

## 2018-06-11 LAB — TSH: TSH: 4.34 u[IU]/mL (ref 0.35–4.50)

## 2018-06-11 MED ORDER — TEMAZEPAM 15 MG PO CAPS: 15.0000 mg | ORAL_CAPSULE | Freq: Every evening | ORAL | 1 refills | Status: DC | PRN

## 2018-06-11 MED FILL — TEMAZEPAM 15 MG CAPSULE: 15 | 30 days supply | Qty: 30 | Fill #0

## 2018-06-11 NOTE — Progress Notes (Signed)
Patient presents to clinic today for annual exam.  Patient is fasting for labs.  Chronic Issues: Depression/Anxiety  --chronic.  Patient on a longstanding regimen of Lexapro 10 mg daily.  Endorses taking as directed with stable mood.  Does note increased stressors recently secondary to work changes from coronavirus.  Is having to work his entire shift and full PPE which makes work very hard and exhausting.  Also stressed due to his high risk of contracting the virus given close contact.  Notes this is having a major impact on sleep.  Patient is currently prescribed trazodone 50 mg nightly as needed for insomnia.  States he is taking but cannot tell any difference.  Does not feel the medication works for him anymore.  Would like to discuss other options for short-term use.  Is following sleep hygiene practices.  Patient denies suicidal thought or ideation.  Health Maintenance: Immunizations -- up-to-date  Past Medical History:  Diagnosis Date   Asthma    Chicken pox    Depression    Resolved   Environmental and seasonal allergies    Essential hypertension, benign 08/03/2013   Hyperlipidemia    Meniere's disease    Thoracic outlet syndrome     Past Surgical History:  Procedure Laterality Date   Nevus Biopsy      Current Outpatient Medications on File Prior to Visit  Medication Sig Dispense Refill   albuterol (VENTOLIN HFA) 108 (90 Base) MCG/ACT inhaler INHALE 2 PUFFS BY MOUTH EVERY 6 HOURS AS NEEDED FOR WHEEZING 24 g 0   atorvastatin (LIPITOR) 20 MG tablet Take 1 tablet (20 mg total) by mouth daily. 90 tablet 1   budesonide-formoterol (SYMBICORT) 80-4.5 MCG/ACT inhaler Inhale 2 puffs into the lungs 2 (two) times daily. 1 Inhaler 6   cyclobenzaprine (FLEXERIL) 10 MG tablet Take 1 tablet (10 mg total) by mouth 3 (three) times daily as needed for muscle spasms. 30 tablet 0   diazepam (VALIUM) 5 MG tablet TAKE 1 TABLET BY MOUTH TWICE DAILY 60 tablet 0   diphenhydrAMINE  (BENADRYL) 25 MG tablet Take 25 mg by mouth every 6 (six) hours as needed for itching or allergies.     escitalopram (LEXAPRO) 10 MG tablet Take 1 tablet (10 mg total) by mouth daily. 90 tablet 0   fenofibrate (TRICOR) 145 MG tablet Take 1 tablet (145 mg total) by mouth daily. 90 tablet 0   finasteride (PROPECIA) 1 MG tablet Take 1 tablet (1 mg total) by mouth daily. 90 tablet 0   fluticasone (FLONASE) 50 MCG/ACT nasal spray Place 2 sprays into both nostrils daily for 10 days. 16 g 0   levocetirizine (XYZAL) 5 MG tablet Take 1 tablet (5 mg total) by mouth every evening. 90 tablet 0   metoprolol succinate (TOPROL-XL) 25 MG 24 hr tablet Take 1 tablet (25 mg total) by mouth daily. 90 tablet 0   montelukast (SINGULAIR) 10 MG tablet Take 1 tablet (10 mg total) by mouth at bedtime. 90 tablet 0   Multiple Vitamin (MULTIVITAMIN WITH MINERALS) TABS tablet Take 1 tablet by mouth daily.     promethazine (PHENERGAN) 25 MG tablet TAKE 1 TABLET BY MOUTH EVERY 8 HOURS AS NEEDED FOR NAUSEA OR VOMITING. 20 tablet 0   scopolamine (TRANSDERM-SCOP) 1 MG/3DAYS Place 1 patch (1.5 mg total) onto the skin every 3 (three) days. 10 patch 12   thiamine (VITAMIN B-1) 100 MG tablet Take 100 mg by mouth daily.     traZODone (DESYREL) 50 MG tablet  Take 1 tablet (50 mg total) by mouth at bedtime as needed. for sleep 90 tablet 0   triamterene-hydrochlorothiazide (DYAZIDE) 37.5-25 MG capsule Take 1 each (1 capsule total) by mouth daily. 90 capsule 0   VASCEPA 1 g CAPS TAKE 2 CAPSULES BY MOUTH TWICE A DAY 360 capsule 1   No current facility-administered medications on file prior to visit.     Allergies  Allergen Reactions   Augmentin [Amoxicillin-Pot Clavulanate]    Belsomra [Suvorexant] Other (See Comments)    nightmares   Percocet [Oxycodone-Acetaminophen] Nausea And Vomiting    Family History  Problem Relation Age of Onset   Hyperlipidemia Father        Living   Stroke Father    Hypertension  Father    Alcohol abuse Mother        Living   Diabetes Mellitus II Maternal Grandfather    Hypertension Maternal Grandfather    Heart failure Maternal Grandfather    Kidney disease Maternal Grandfather    Heart disease Maternal Grandmother    Melanoma Paternal Grandmother    Heart attack Paternal Grandfather    Migraines Brother    Healthy Brother        x2   Drug abuse Sister        Died of Overdose    Social History   Socioeconomic History   Marital status: Single    Spouse name: Not on file   Number of children: Not on file   Years of education: Not on file   Highest education level: Not on file  Occupational History   Not on file  Social Needs   Financial resource strain: Not on file   Food insecurity:    Worry: Not on file    Inability: Not on file   Transportation needs:    Medical: Not on file    Non-medical: Not on file  Tobacco Use   Smoking status: Never Smoker   Smokeless tobacco: Never Used  Substance and Sexual Activity   Alcohol use: Yes    Alcohol/week: 0.0 standard drinks    Comment: 2-3 beers nightly   Drug use: No   Sexual activity: Yes    Birth control/protection: None    Comment: male - 1 partner  Lifestyle   Physical activity:    Days per week: Not on file    Minutes per session: Not on file   Stress: Not on file  Relationships   Social connections:    Talks on phone: Not on file    Gets together: Not on file    Attends religious service: Not on file    Active member of club or organization: Not on file    Attends meetings of clubs or organizations: Not on file    Relationship status: Not on file   Intimate partner violence:    Fear of current or ex partner: Not on file    Emotionally abused: Not on file    Physically abused: Not on file    Forced sexual activity: Not on file  Other Topics Concern   Not on file  Social History Narrative   Not on file   Review of Systems  Constitutional: Negative  for fever and weight loss.  HENT: Negative for ear discharge, ear pain, hearing loss and tinnitus.   Eyes: Negative for blurred vision, double vision, photophobia and pain.  Respiratory: Negative for cough and shortness of breath.   Cardiovascular: Negative for chest pain and palpitations.  Gastrointestinal: Negative  for abdominal pain, blood in stool, constipation, diarrhea, heartburn, melena, nausea and vomiting.  Genitourinary: Negative for dysuria, flank pain, frequency, hematuria and urgency.  Musculoskeletal: Negative for falls.  Neurological: Negative for dizziness, loss of consciousness and headaches.  Endo/Heme/Allergies: Negative for environmental allergies.  Psychiatric/Behavioral: Negative for depression, hallucinations, substance abuse and suicidal ideas. The patient is not nervous/anxious and does not have insomnia.    BP 108/80    Pulse 92    Temp 98.4 F (36.9 C) (Skin)    Resp 16    Ht 5\' 10"  (1.778 m)    Wt 211 lb (95.7 kg)    SpO2 98%    BMI 30.28 kg/m   Physical Exam Vitals signs reviewed.  HENT:     Head: Normocephalic and atraumatic.     Right Ear: External ear normal.     Left Ear: External ear normal.     Nose: Nose normal.     Mouth/Throat:     Pharynx: No oropharyngeal exudate.  Eyes:     Conjunctiva/sclera: Conjunctivae normal.     Pupils: Pupils are equal, round, and reactive to light.  Neck:     Musculoskeletal: Neck supple.     Thyroid: No thyromegaly.  Cardiovascular:     Rate and Rhythm: Normal rate and regular rhythm.     Heart sounds: Normal heart sounds.  Pulmonary:     Effort: Pulmonary effort is normal. No respiratory distress.     Breath sounds: Normal breath sounds. No wheezing or rales.  Chest:     Chest wall: No tenderness.  Abdominal:     General: Bowel sounds are normal. There is no distension.     Palpations: Abdomen is soft. There is no mass.     Tenderness: There is no abdominal tenderness. There is no guarding or rebound.    Genitourinary:    Penis: Normal. No discharge.   Lymphadenopathy:     Cervical: No cervical adenopathy.  Skin:    General: Skin is warm and dry.     Findings: No rash.  Neurological:     Mental Status: He is alert and oriented to person, place, and time.    Assessment/Plan: 1. Visit for preventive health examination Depression screen negative. Health Maintenance reviewed. Preventive schedule discussed and handout given in AVS. Will obtain fasting labs today.  - CBC with Differential/Platelet - Comprehensive metabolic panel - Hemoglobin A1c - Lipid panel - TSH  2. Routine screening for STI (sexually transmitted infection) Asymptomatic.  Routine check that he has yearly.  Will obtain full STI panel. - Acute Hep Panel & Hep B Surface Ab - HIV Antibody (routine testing w rflx) - RPR - HSV(herpes smplx)abs-1+2(IgG+IgM)-bld - Urine cytology ancillary only([facility])  3. Need for hepatitis A vaccination Patient due for second hep A vaccination.  Would like to give today.  Immunization given by CMA.  4. Essential hypertension, benign Blood pressure stable on current regimen.  Asymptomatic.  Repeat metabolic panel today.  Will continue current regimen.  Patient to continue working on diet and exercise.  5. Gastroesophageal reflux disease without esophagitis Stable.  No change today.  6. Meniere's disease, unspecified laterality Taking medications as directed.  Currently asymptomatic.  Continue current regimen.  7. Depression, unspecified depression type Stable overall.  Will continue Lexapro 10 mg daily.  Patient denies any added benefit of trazodone.  Will stop this medication for now.  Insomnia treatment as noted below.  8. Primary insomnia Trazodone to be discontinued today.  Will  attempt trial of 15 mg Restoril at night.  Sleep hygiene practices again reviewed with patient.  Handout given.  Patient to follow-up via my chart in 2 weeks.   Leeanne Rio, PA-C

## 2018-06-11 NOTE — Patient Instructions (Signed)
-Please go to the lab for blood work.  -Our office will call you with your results unless you have chosen to receive results via MyChart. -If your blood work is normal we will follow-up each year for physicals and as scheduled for chronic medical problems. -If anything is abnormal we will treat accordingly and get you in for a follow-up.  -Stop the Trazodone and start the Temazepam (Restoril) at night for sleep.  -Review the sleep hygiene recommendations below to see if these help. -Message me in a couple of weeks to let me know how things are going.  Hang in there!  Sleep Hygiene  Do: (1) Go to bed at the same time each day. (2) Get up from bed at the same time each day. (3) Get regular exercise each day, preferably in the morning.  There is goof evidence that regular exercise improves restful sleep.  This includes stretching and aerobic exercise. (4) Get regular exposure to outdoor or bright lights, especially in the late afternoon. (5) Keep the temperature in your bedroom comfortable. (6) Keep the bedroom quiet when sleeping. (7) Keep the bedroom dark enough to facilitate sleep. (8) Use your bed only for sleep and sex. (9) Take medications as directed.  It is helpful to take prescribed sleeping pills 1 hour before bedtime, so they are causing drowsiness when you lie down, or 10 hours before getting up, to avoid daytime drowsiness. (10) Use a relaxation exercise just before going to sleep -- imagery, massage, warm bath. (11) Keep your feet and hands warm.  Wear warm socks and/or mittens or gloves to bed.  Don't: (1) Exercise just before going to bed. (2) Engage in stimulating activity just before bed, such as playing a competitive game, watching an exciting program on television, or having an important discussion with a loved one. (3) Have caffeine in the evening (coffee, teas, chocolate, sodas, etc.) (4) Read or watch television in bed. (5) Use alcohol to help you sleep. (6) Go to  bed too hungry or too full. (7) Take another person's sleeping pills. (8) Take over-the-counter sleeping pills, without your doctor's knowledge.  Tolerance can develop rapidly with these medications.  Diphenhydramine can have serious side effects for elderly patients. (9) Take daytime naps. (10) Command yourself to go to sleep.  This only makes your mind and body more alert.  If you lie awake for more than 20-30 minutes, get up, go to a different room, participate in a quiet activity (Ex - non-excitable reading or television), and then return to bed when you feel sleepy.  Do this as many times during the night as needed.  This may cause you to have a night or two of poor sleep but it will train your brain to know when it is time for sleep.   Preventive Care 18-39 Years, Male Preventive care refers to lifestyle choices and visits with your health care provider that can promote health and wellness. What does preventive care include?   A yearly physical exam. This is also called an annual well check.  Dental exams once or twice a year.  Routine eye exams. Ask your health care provider how often you should have your eyes checked.  Personal lifestyle choices, including: ? Daily care of your teeth and gums. ? Regular physical activity. ? Eating a healthy diet. ? Avoiding tobacco and drug use. ? Limiting alcohol use. ? Practicing safe sex. What happens during an annual well check? The services and screenings done by your health care  provider during your annual well check will depend on your age, overall health, lifestyle risk factors, and family history of disease. Counseling Your health care provider may ask you questions about your:  Alcohol use.  Tobacco use.  Drug use.  Emotional well-being.  Home and relationship well-being.  Sexual activity.  Eating habits.  Work and work Statistician. Screening You may have the following tests or measurements:  Height, weight, and  BMI.  Blood pressure.  Lipid and cholesterol levels. These may be checked every 5 years starting at age 42.  Diabetes screening. This is done by checking your blood sugar (glucose) after you have not eaten for a while (fasting).  Skin check.  Hepatitis C blood test.  Hepatitis B blood test.  Sexually transmitted disease (STD) testing. Discuss your test results, treatment options, and if necessary, the need for more tests with your health care provider. Vaccines Your health care provider may recommend certain vaccines, such as:  Influenza vaccine. This is recommended every year.  Tetanus, diphtheria, and acellular pertussis (Tdap, Td) vaccine. You may need a Td booster every 10 years.  Varicella vaccine. You may need this if you have not been vaccinated.  HPV vaccine. If you are 91 or younger, you may need three doses over 6 months.  Measles, mumps, and rubella (MMR) vaccine. You may need at least one dose of MMR.You may also need a second dose.  Pneumococcal 13-valent conjugate (PCV13) vaccine. You may need this if you have certain conditions and have not been vaccinated.  Pneumococcal polysaccharide (PPSV23) vaccine. You may need one or two doses if you smoke cigarettes or if you have certain conditions.  Meningococcal vaccine. One dose is recommended if you are age 48-21 years and a first-year college student living in a residence hall, or if you have one of several medical conditions. You may also need additional booster doses.  Hepatitis A vaccine. You may need this if you have certain conditions or if you travel or work in places where you may be exposed to hepatitis A.  Hepatitis B vaccine. You may need this if you have certain conditions or if you travel or work in places where you may be exposed to hepatitis B.  Haemophilus influenzae type b (Hib) vaccine. You may need this if you have certain risk factors. Talk to your health care provider about which screenings and  vaccines you need and how often you need them. This information is not intended to replace advice given to you by your health care provider. Make sure you discuss any questions you have with your health care provider. Document Released: 02/26/2001 Document Revised: 08/13/2016 Document Reviewed: 11/01/2014 Elsevier Interactive Patient Education  2019 [employer].

## 2018-06-12 ENCOUNTER — Telehealth: Payer: Self-pay | Admitting: Physician Assistant

## 2018-06-12 ENCOUNTER — Other Ambulatory Visit: Payer: Self-pay

## 2018-06-12 DIAGNOSIS — D729 Disorder of white blood cells, unspecified: Secondary | ICD-10-CM

## 2018-06-12 LAB — RPR: RPR Ser Ql: NONREACTIVE

## 2018-06-12 LAB — REFLEX TIQ

## 2018-06-12 LAB — ACUTE HEP PANEL AND HEP B SURFACE AB
HEPATITIS C ANTIBODY REFILL$(REFL): NONREACTIVE
Hep A IgM: NONREACTIVE
Hep B C IgM: NONREACTIVE
Hepatitis B Surface Ag: NONREACTIVE
SIGNAL TO CUT-OFF: 0.01 (ref <1.00)

## 2018-06-12 LAB — URINE CYTOLOGY ANCILLARY ONLY
Chlamydia: NEGATIVE
Neisseria Gonorrhea: NEGATIVE

## 2018-06-12 LAB — HIV ANTIBODY (ROUTINE TESTING W REFLEX): HIV 1&2 Ab, 4th Generation: NONREACTIVE

## 2018-06-12 NOTE — Telephone Encounter (Signed)
Spoke with patient to let him know that Steve Jacobs is out of the office until Monday, asked if there was anything that I could do to help. He stated that he needed to speak to a provider concerning his issues.

## 2018-06-12 NOTE — Telephone Encounter (Signed)
Please advise on an office visit or just a my chart message or phone call to the pt   Copied from [place] [phone]. Topic: General - Inquiry >> Jun 11, 2018  4:14 PM Steve Jacobs, [place] wrote: Patient would like to discuss with Elyn Aquas the difference between his lipid panels. Call back is [phone].

## 2018-06-13 ENCOUNTER — Encounter: Payer: Self-pay | Admitting: Physician Assistant

## 2018-06-15 NOTE — Telephone Encounter (Signed)
MyChart message sent to patient and is being discussed.

## 2018-06-17 ENCOUNTER — Encounter: Payer: Self-pay | Admitting: Physician Assistant

## 2018-06-18 MED ORDER — FLUTICASONE PROPIONATE 50 MCG/ACT NA SUSP: 2.0000 | Freq: Every day | NASAL | 3 refills | Status: AC

## 2018-06-18 MED FILL — FLUTICASONE PROP 50 MCG SPR: 50 | 30 days supply | Qty: 16 | Fill #0

## 2018-06-21 ENCOUNTER — Encounter: Payer: Self-pay | Admitting: Physician Assistant

## 2018-06-22 ENCOUNTER — Other Ambulatory Visit: Payer: Self-pay

## 2018-06-22 DIAGNOSIS — Z0000 Encounter for general adult medical examination without abnormal findings: Secondary | ICD-10-CM

## 2018-06-22 DIAGNOSIS — Z113 Encounter for screening for infections with a predominantly sexual mode of transmission: Secondary | ICD-10-CM

## 2018-06-22 NOTE — Addendum Note (Signed)
Addended by: Doran Clay A on: 06/22/2018 10:40 AM   Modules accepted: Orders

## 2018-06-26 ENCOUNTER — Encounter: Payer: Self-pay | Admitting: Physician Assistant

## 2018-06-29 ENCOUNTER — Other Ambulatory Visit: Payer: Self-pay | Admitting: Physician Assistant

## 2018-06-29 MED FILL — PROMETHAZINE 25 MG TABLET: 25 | 7 days supply | Qty: 20 | Fill #0

## 2018-06-30 ENCOUNTER — Encounter: Payer: Self-pay | Admitting: Physician Assistant

## 2018-07-08 ENCOUNTER — Other Ambulatory Visit: Payer: Self-pay | Admitting: Emergency Medicine

## 2018-07-08 ENCOUNTER — Ambulatory Visit: Payer: No Typology Code available for payment source

## 2018-07-08 MED ORDER — MONTELUKAST SODIUM 10 MG PO TABS: 10.0000 mg | ORAL_TABLET | Freq: Every day | ORAL | 1 refills | Status: DC

## 2018-07-08 MED FILL — MONTELUKAST SOD 10 MG TAB: 10 | 90 days supply | Qty: 90 | Fill #0

## 2018-07-10 ENCOUNTER — Other Ambulatory Visit: Payer: Self-pay | Admitting: Physician Assistant

## 2018-07-10 ENCOUNTER — Encounter: Payer: Self-pay | Admitting: Physician Assistant

## 2018-07-13 MED ORDER — TEMAZEPAM 30 MG PO CAPS: 30.0000 mg | ORAL_CAPSULE | Freq: Every evening | ORAL | 0 refills | Status: DC | PRN

## 2018-07-13 MED FILL — diazePAM 5 MG TABS: 5 | 30 days supply | Qty: 60 | Fill #0

## 2018-07-13 MED FILL — TEMAZEPAM 30 MG CAPSULE: 30 | 30 days supply | Qty: 30 | Fill #0

## 2018-07-13 NOTE — Addendum Note (Signed)
Addended by: Brunetta Jeans on: 07/13/2018 04:30 PM   Modules accepted: Orders

## 2018-07-20 ENCOUNTER — Encounter: Payer: Self-pay | Admitting: Physician Assistant

## 2018-07-21 ENCOUNTER — Encounter: Payer: Self-pay | Admitting: Physician Assistant

## 2018-07-22 ENCOUNTER — Ambulatory Visit: Payer: No Typology Code available for payment source

## 2018-07-22 ENCOUNTER — Other Ambulatory Visit: Payer: Self-pay | Admitting: Physician Assistant

## 2018-07-22 MED FILL — TRIAMTERENE/HCTZ 37.5/25 CP: 37.5-25 | 90 days supply | Qty: 90 | Fill #0

## 2018-07-30 ENCOUNTER — Encounter: Payer: Self-pay | Admitting: Physician Assistant

## 2018-07-30 MED FILL — METOPROLOL SUCCINATE ER 25: 25 | 90 days supply | Qty: 90 | Fill #1

## 2018-07-30 MED FILL — ATORVASTATIN 20 MG TABLET: 20 | 90 days supply | Qty: 90 | Fill #0

## 2018-07-30 NOTE — Telephone Encounter (Signed)
Patient scheduled for an video visit on Monday 08/03/18 with PCP

## 2018-08-03 ENCOUNTER — Ambulatory Visit: Payer: No Typology Code available for payment source | Admitting: Physician Assistant

## 2018-08-03 DIAGNOSIS — Z0289 Encounter for other administrative examinations: Secondary | ICD-10-CM

## 2018-08-10 ENCOUNTER — Other Ambulatory Visit: Payer: Self-pay | Admitting: Physician Assistant

## 2018-08-10 MED FILL — diazePAM 5 MG TABS: 5 | 30 days supply | Qty: 60 | Fill #0

## 2018-08-10 MED FILL — TEMAZEPAM 30 MG CAPSULE: 30 | 30 days supply | Qty: 30 | Fill #0

## 2018-08-10 NOTE — Telephone Encounter (Signed)
Diazepam rx 07/13/18 #60 Temazepam 07/13/18 #30

## 2018-08-14 ENCOUNTER — Other Ambulatory Visit: Payer: Self-pay | Admitting: Physician Assistant

## 2018-08-17 ENCOUNTER — Other Ambulatory Visit: Payer: Self-pay | Admitting: Physician Assistant

## 2018-08-17 MED FILL — OMEPRAZOLE 20 MG CAPSULE DR: 20 | 90 days supply | Qty: 90 | Fill #0

## 2018-08-17 MED FILL — LEVOCETIRIZINE 5 MG TABLET: 5 | 90 days supply | Qty: 90 | Fill #0

## 2018-08-18 MED FILL — ESCITALOPRAM 10 MG TABLET: 10 | 90 days supply | Qty: 90 | Fill #0

## 2018-08-18 MED FILL — FENOFIBRATE 145 MG TABS: 145 | 90 days supply | Qty: 90 | Fill #0

## 2018-08-18 MED FILL — FINASTERIDE 1 MG TABLET: 1 | 90 days supply | Qty: 90 | Fill #0

## 2018-08-22 ENCOUNTER — Other Ambulatory Visit: Payer: Self-pay

## 2018-08-22 ENCOUNTER — Encounter (HOSPITAL_COMMUNITY): Payer: Self-pay | Admitting: Emergency Medicine

## 2018-08-22 ENCOUNTER — Emergency Department (HOSPITAL_COMMUNITY)
Admission: EM | Admit: 2018-08-22 | Discharge: 2018-08-22 | Disposition: A | Discharge status: Home / Self Care | Payer: PRIVATE HEALTH INSURANCE | Attending: Emergency Medicine | Admitting: Emergency Medicine

## 2018-08-22 DIAGNOSIS — I10 Essential (primary) hypertension: Secondary | ICD-10-CM | POA: Diagnosis not present

## 2018-08-22 DIAGNOSIS — M5441 Lumbago with sciatica, right side: Secondary | ICD-10-CM | POA: Diagnosis not present

## 2018-08-22 DIAGNOSIS — M62830 Muscle spasm of back: Secondary | ICD-10-CM | POA: Insufficient documentation

## 2018-08-22 DIAGNOSIS — J45909 Unspecified asthma, uncomplicated: Secondary | ICD-10-CM | POA: Insufficient documentation

## 2018-08-22 DIAGNOSIS — M545 Low back pain: Secondary | ICD-10-CM | POA: Diagnosis present

## 2018-08-22 MED ORDER — CYCLOBENZAPRINE HCL 10 MG PO TABS
10.0000 mg | ORAL_TABLET | Freq: Once | ORAL | Status: AC
  Administered 2018-08-22: 10 mg via ORAL
  Filled 2018-08-22: qty 1

## 2018-08-22 MED ORDER — KETOROLAC TROMETHAMINE 60 MG/2ML IM SOLN
30.0000 mg | Freq: Once | INTRAMUSCULAR | Status: AC
  Administered 2018-08-22: 30 mg via INTRAMUSCULAR
  Filled 2018-08-22: qty 2

## 2018-08-22 MED ORDER — CYCLOBENZAPRINE HCL 10 MG PO TABS: 10.0000 mg | ORAL_TABLET | Freq: Two times a day (BID) | ORAL | 0 refills | Status: DC | PRN

## 2018-08-22 MED ORDER — DEXAMETHASONE SODIUM PHOSPHATE 10 MG/ML IJ SOLN
10.0000 mg | Freq: Once | INTRAMUSCULAR | Status: AC
  Administered 2018-08-22: 18:00:00 10 mg via INTRAMUSCULAR
  Filled 2018-08-22: qty 1

## 2018-08-22 MED ORDER — IBUPROFEN 600 MG PO TABS: 600.0000 mg | ORAL_TABLET | Freq: Four times a day (QID) | ORAL | 0 refills | Status: DC | PRN

## 2018-08-22 NOTE — ED Notes (Signed)
Patient verbalizes understanding of discharge instructions. Opportunity for questioning and answers were provided.  pt discharged from ED. ambulatory by self   

## 2018-08-22 NOTE — ED Triage Notes (Signed)
Pt reports a 300 lb stretcher malfunctioned while coming out of a truck. Pt reports the stretcher pulled him over. Pt reports lower back pain radiating to R leg, feels like burning. Pt reports worsening with bending.

## 2018-08-22 NOTE — ED Provider Notes (Addendum)
[place] EMERGENCY DEPARTMENT Provider Note   CSN: [id number] Arrival date & time: 08/22/18  1600     History   Chief Complaint Chief Complaint  Patient presents with  . Back Injury    HPI Steve Jacobs is a 33 y.o. male who presents with back pain.  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  Approximately 10 minutes later he started to feel gradually worsening right-sided low back pain.  Pain is sharp and radiates from the right lower back around the upper thigh into the knee.  The pain is constant and worse with movement and bending.  Reports associated muscle spasms and cramping as well as some slight paresthesias..  He has difficulty bending over.  He has never had this pain before.  He has been ambulatory with some difficulty.  He has not had anything for pain. No fever, syncope, trauma, unexplained weight loss, hx of cancer, loss of bowel/bladder function, saddle anesthesia, urinary retention, IVDU.    HPI  Past Medical History:  Diagnosis Date  . Asthma   . Chicken pox   . Depression    Resolved  . Environmental and seasonal allergies   . Essential hypertension, benign 08/03/2013  . Hyperlipidemia   . Meniere's disease   . Thoracic outlet syndrome     Patient Active Problem List   Diagnosis Date Noted  . Gastroesophageal reflux disease without esophagitis 06/19/2017  . Need for hepatitis A vaccination 06/19/2017  . 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 Routine screening for STI (sexually transmitted infection) 06/07/2016  . SVT (supraventricular tachycardia) ([place]) 10/08/2014  . Depression 10/08/2014  . Atypical nevi 06/22/2014  . Elevated triglycerides with high cholesterol 09/06/2013  . Anxiety state 08/03/2013  . Essential hypertension, benign 08/03/2013  . Environmental allergies 03/23/2013  . Visit for preventive health examination 03/23/2013  .  Meniere disease 03/23/2013  . Intrinsic asthma     Past Surgical History:  Procedure Laterality Date  . Nevus Biopsy          Home Medications    Prior to Admission medications   Medication Sig Start Date End Date Taking? Authorizing Provider  albuterol (VENTOLIN HFA) 108 (90 Base) MCG/ACT inhaler INHALE 2 PUFFS BY MOUTH EVERY 6 HOURS AS NEEDED FOR WHEEZING 03/16/18   Brunetta Jeans, PA-C  atorvastatin (LIPITOR) 20 MG tablet TAKE 1 TABLET (20 MG TOTAL) BY MOUTH DAILY. 08/17/18   Brunetta Jeans, PA-C  budesonide-formoterol (SYMBICORT) 80-4.5 MCG/ACT inhaler Inhale 2 puffs into the lungs 2 (two) times daily. 04/09/18   Brunetta Jeans, PA-C  cyclobenzaprine (FLEXERIL) 10 MG tablet Take 1 tablet (10 mg total) by mouth 3 (three) times daily as needed for muscle spasms. 04/13/18   Dutch Quint B, FNP  diazepam (VALIUM) 5 MG tablet TAKE 1 TABLET BY MOUTH TWICE DAILY 08/10/18   Brunetta Jeans, PA-C  diphenhydrAMINE (BENADRYL) 25 MG tablet Take 25 mg by mouth every 6 (six) hours as needed for itching or allergies.    [provider]  escitalopram (LEXAPRO) 10 MG tablet TAKE 1 TABLET (10 MG TOTAL) BY MOUTH DAILY. 08/18/18   Brunetta Jeans, PA-C  fenofibrate (TRICOR) 145 MG tablet TAKE 1 TABLET (145 MG TOTAL) BY MOUTH DAILY. 08/18/18   Brunetta Jeans, PA-C  finasteride (PROPECIA) 1 MG tablet TAKE 1 TABLET (1 MG TOTAL) BY MOUTH DAILY. 08/18/18  Brunetta Jeans, PA-C  fluticasone [facility]) 50 MCG/ACT nasal spray Place 2 sprays into both nostrils daily. 06/18/18   Brunetta Jeans, PA-C  levocetirizine (XYZAL) 5 MG tablet TAKE 1 TABLET (5 MG TOTAL) BY MOUTH EVERY EVENING. 08/17/18   Brunetta Jeans, PA-C  metoprolol succinate (TOPROL-XL) 25 MG 24 hr tablet Take 1 tablet (25 mg total) by mouth daily. 04/07/18   Brunetta Jeans, PA-C  montelukast (SINGULAIR) 10 MG tablet Take 1 tablet (10 mg total) by mouth at bedtime. 07/08/18   Brunetta Jeans, PA-C  Multiple Vitamin (MULTIVITAMIN  WITH MINERALS) TABS tablet Take 1 tablet by mouth daily.    [provider]  omeprazole (PRILOSEC) 20 MG capsule TAKE 1 CAPSULE (20 MG TOTAL) BY MOUTH DAILY. 08/17/18   Brunetta Jeans, PA-C  promethazine (PHENERGAN) 25 MG tablet TAKE 1 TABLET BY MOUTH EVERY 8 HOURS AS NEEDED FOR NAUSEA OR VOMITING 06/29/18   Brunetta Jeans, PA-C  temazepam (RESTORIL) 30 MG capsule TAKE 1 CAPSULE (30 MG TOTAL) BY MOUTH AT BEDTIME AS NEEDED FOR SLEEP. 08/10/18   Brunetta Jeans, PA-C  thiamine (VITAMIN B-1) 100 MG tablet Take 100 mg by mouth daily.    [provider]  triamterene-hydrochlorothiazide (DYAZIDE) 37.5-25 MG capsule TAKE 1 EACH (1 CAPSULE TOTAL) BY MOUTH DAILY. 07/22/18   Brunetta Jeans, PA-C  VASCEPA 1 g CAPS TAKE 2 CAPSULES BY MOUTH TWICE A DAY 06/01/18   Brunetta Jeans, PA-C    Family History Family History  Problem Relation Age of Onset  . Hyperlipidemia Father        Living  . Stroke Father   . Hypertension Father   . Alcohol abuse Mother        Living  . Diabetes Mellitus II Maternal Grandfather   . Hypertension Maternal Grandfather   . Heart failure Maternal Grandfather   . Kidney disease Maternal Grandfather   . Heart disease Maternal Grandmother   . Melanoma Paternal Grandmother   . Heart attack Paternal Grandfather   . Migraines Brother   . Healthy Brother        x2  . Drug abuse Sister        Died of Overdose    Social History Social History   Tobacco Use  . Smoking status: Never Smoker  . Smokeless tobacco: Never Used  Substance Use Topics  . Alcohol use: Yes    Alcohol/week: 0.0 standard drinks    Comment: 2-3 beers nightly  . Drug use: No     Allergies   Augmentin [amoxicillin-pot clavulanate], Belsomra [suvorexant], and Percocet [oxycodone-acetaminophen]   Review of Systems Review of Systems  Constitutional: Negative for fever.  Musculoskeletal: Positive for back pain.     Physical Exam Updated Vital Signs BP (!) 143/100 (BP  Location: Right Arm)   Pulse 90   Temp 98.4 F (36.9 C) (Oral)   Resp 14   Ht 5\' 10"  (1.778 m)   Wt 97.5 kg   SpO2 98%   BMI 30.85 kg/m   Physical Exam Vitals signs and nursing note reviewed.  Constitutional:      General: He is not in acute distress.    Appearance: Normal appearance. He is well-developed. He is not ill-appearing.     Comments: Calm, cooperative. Uncomfortable appearing  HENT:     Head: Normocephalic and atraumatic.  Eyes:     General: No scleral icterus.       Right eye: No discharge.  Left eye: No discharge.     Conjunctiva/sclera: Conjunctivae normal.     Pupils: Pupils are equal, round, and reactive to light.  Neck:     Musculoskeletal: Normal range of motion.  Cardiovascular:     Rate and Rhythm: Normal rate.  Pulmonary:     Effort: Pulmonary effort is normal. No respiratory distress.  Abdominal:     General: There is no distension.  Musculoskeletal:     Comments: Back: Inspection: No masses, deformity, or rash Palpation: No midline spinal tenderness. Right lumbar paraspinal muscle tenderness. Strength: 5/5 in lower extremities and normal plantar and dorsiflexion Sensation: Intact sensation with light touch in lower extremities bilaterally Reflexes: Patellar reflex is 2+ bilaterally SLR: Negative seated straight leg raise Gait: Antalgic gait   Skin:    General: Skin is warm and dry.  Neurological:     Mental Status: He is alert and oriented to person, place, and time.  Psychiatric:        Behavior: Behavior normal.      ED Treatments / Results  Labs (all labs ordered are listed, but only abnormal results are displayed) Labs Reviewed - No data to display  EKG None  Radiology No results found.  Procedures Procedures (including critical care time)  Medications Ordered in ED Medications  ketorolac (TORADOL) injection 30 mg (30 mg Intramuscular Given 08/22/18 1831)  cyclobenzaprine (FLEXERIL) tablet 10 mg (10 mg Oral Given  08/22/18 1831)  dexamethasone (DECADRON) injection 10 mg (10 mg Intramuscular Given 08/22/18 1829)     Initial Impression / Assessment and Plan / ED Course  I have reviewed the triage vital signs and the nursing notes.  Pertinent labs & imaging results that were available during my care of the patient were reviewed by me and considered in my medical decision making (see chart for details).  33 year old male presents with acute right-sided low back pain with sciatica after he had to lift a heavy structure at work today. He is hypertensive but otherwise vital signs are normal.  He has no red flags in history on exam.  He is ambulatory with some difficulty due to pain.  I do not think any imaging today would be helpful for him since clinically symptoms are consistent with sciatica and there has not been any trauma.  Will give him IM Toradol and IM Decadron, Flexeril.  He was given a prescription for ibuprofen and Flexeril.  He has a PCP who can follow-up for possible outpatient MRI if symptoms are not improving.  Final Clinical Impressions(s) / ED Diagnoses   Final diagnoses:  Back muscle spasm  Acute right-sided low back pain with right-sided sciatica    ED Discharge Orders    None       Recardo Evangelist, PA-C 08/22/18 1836    Recardo Evangelist, PA-C 08/22/18 1853    Lucrezia Starch, MD 08/23/18 [phone]

## 2018-08-22 NOTE — ED Notes (Signed)
Called x 1 no answer

## 2018-08-22 NOTE — Discharge Instructions (Addendum)
Take Ibuprofen 600mg  three times daily with food for the next 5 days Take Flexeril as needed for muscle pain and spasms This medicine makes you drowsy so do not take before driving or work Use a heating pad for sore muscles - use for 20 minutes several times a day You can also try topical rubs or a lidocaine patch which are over the counter Follow up with your doctor for possible MRI if you are not improving. Return for worsening symptoms

## 2018-08-24 MED FILL — IBUPROFEN 600 MG TABLET: 600 | 8 days supply | Qty: 30 | Fill #0

## 2018-08-24 MED FILL — CYCLOBENZAPRINE 10 MG TAB: 10 | 10 days supply | Qty: 20 | Fill #0

## 2018-08-25 MED FILL — predniSONE 10 MG TABS: 10 | 5 days supply | Qty: 25 | Fill #0

## 2018-08-26 ENCOUNTER — Emergency Department (HOSPITAL_COMMUNITY)
Admission: EM | Admit: 2018-08-26 | Discharge: 2018-08-27 | Disposition: A | Discharge status: Home / Self Care | Payer: PRIVATE HEALTH INSURANCE | Attending: Emergency Medicine | Admitting: Emergency Medicine

## 2018-08-26 ENCOUNTER — Encounter (HOSPITAL_COMMUNITY): Payer: Self-pay | Admitting: Emergency Medicine

## 2018-08-26 DIAGNOSIS — X500XXA Overexertion from strenuous movement or load, initial encounter: Secondary | ICD-10-CM | POA: Insufficient documentation

## 2018-08-26 DIAGNOSIS — M5137 Other intervertebral disc degeneration, lumbosacral region: Secondary | ICD-10-CM | POA: Insufficient documentation

## 2018-08-26 DIAGNOSIS — Z79899 Other long term (current) drug therapy: Secondary | ICD-10-CM | POA: Diagnosis not present

## 2018-08-26 DIAGNOSIS — Y9289 Other specified places as the place of occurrence of the external cause: Secondary | ICD-10-CM | POA: Diagnosis not present

## 2018-08-26 DIAGNOSIS — J45909 Unspecified asthma, uncomplicated: Secondary | ICD-10-CM | POA: Insufficient documentation

## 2018-08-26 DIAGNOSIS — I10 Essential (primary) hypertension: Secondary | ICD-10-CM | POA: Insufficient documentation

## 2018-08-26 DIAGNOSIS — M545 Low back pain: Secondary | ICD-10-CM | POA: Diagnosis present

## 2018-08-26 DIAGNOSIS — Y9389 Activity, other specified: Secondary | ICD-10-CM | POA: Insufficient documentation

## 2018-08-26 DIAGNOSIS — Y990 Civilian activity done for income or pay: Secondary | ICD-10-CM | POA: Insufficient documentation

## 2018-08-26 NOTE — ED Triage Notes (Signed)
Patient reports being injured while at work on Saturday - states ambulance malfunctioned and the EMS stretcher fell on top of him - being followed by employee health and has been seen twice for same, but states pain is increasing too much to bear. Endorses numbness/tingling to R leg, 1 episode of urinary incontinence. Currently taking prednisone as prescribed. Has not had any prior imaging. Ambulatory with steady gait.

## 2018-08-27 ENCOUNTER — Emergency Department (HOSPITAL_COMMUNITY): Payer: PRIVATE HEALTH INSURANCE

## 2018-08-27 MED ORDER — ACETAMINOPHEN 325 MG PO TABS
650.0000 mg | ORAL_TABLET | Freq: Once | ORAL | Status: AC
  Administered 2018-08-27: 01:00:00 650 mg via ORAL
  Filled 2018-08-27: qty 2

## 2018-08-27 MED ORDER — NAPROXEN 250 MG PO TABS: 500.0000 mg | ORAL_TABLET | Freq: Once | ORAL | Status: DC

## 2018-08-27 MED ORDER — ACETAMINOPHEN ER 650 MG PO TBCR: 650.0000 mg | EXTENDED_RELEASE_TABLET | Freq: Three times a day (TID) | ORAL | 0 refills | Status: DC | PRN

## 2018-08-27 MED ORDER — NAPROXEN 250 MG PO TABS
500.0000 mg | ORAL_TABLET | Freq: Once | ORAL | Status: AC
  Administered 2018-08-27: 500 mg via ORAL
  Filled 2018-08-27: qty 2

## 2018-08-27 NOTE — ED Provider Notes (Signed)
[place] EMERGENCY DEPARTMENT Provider Note   CSN: [id number] Arrival date & time: 08/26/18  1652    History   Chief Complaint Chief Complaint  Patient presents with   Back Pain    HPI Steve Jacobs is a 33 y.o. male.     HPI  33 year old male comes in a chief complaint of back pain. 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  Last week he had an accident while pulling out a stretcher from 1 of the rigs.  The heavy stretcher essentially malfunctioned and pulled him forward, leading to a certain jerking movement.  Patient had to leave work later that day because of some discomfort and he came to the ER thereafter because of worsening pain.  He has seen occupational medicine team.  Currently is on high-dose steroids.  He is complaining of constant lower back pain with radiation of pain into his right lower extremity down to the toes and left lower extremity up to the knee.  He also has numbness type feeling going down the right lower extremity.  His symptoms are better and worse with certain positions.  He is not taking any pain medication besides the anti-inflammatory prednisone.  His symptoms have progressed and is not getting any rest, therefore he came to the ER at the recommendation of the occupational medicine team.  Pt has no associated lower extremity weakness, urinary incontinence, urinary retention, bowel incontinence, pins and needle sensation in the perineal area.   Past Medical History:  Diagnosis Date   Asthma    Chicken pox    Depression    Resolved   Environmental and seasonal allergies    Essential hypertension, benign 08/03/2013   Hyperlipidemia    Meniere's disease    Thoracic outlet syndrome     Patient Active Problem List   Diagnosis Date Noted   Gastroesophageal reflux disease without esophagitis 06/19/2017   Need for hepatitis A vaccination 06/19/2017   Primary insomnia 02/02/2017   Routine screening  for STI (sexually transmitted infection) 06/07/2016   SVT (supraventricular tachycardia) ([place]) 10/08/2014   Depression 10/08/2014   Atypical nevi 06/22/2014   Elevated triglycerides with high cholesterol 09/06/2013   Anxiety state 08/03/2013   Essential hypertension, benign 08/03/2013   Environmental allergies 03/23/2013   Visit for preventive health examination 03/23/2013   Meniere disease 03/23/2013   Intrinsic asthma     Past Surgical History:  Procedure Laterality Date   Nevus Biopsy          Home Medications    Prior to Admission medications   Medication Sig Start Date End Date Taking? Authorizing Provider  acetaminophen (TYLENOL 8 HOUR) 650 MG CR tablet Take 1 tablet (650 mg total) by mouth every 8 (eight) hours as needed for pain or fever. 08/27/18   Varney Biles, MD  albuterol (VENTOLIN HFA) 108 (90 Base) MCG/ACT inhaler INHALE 2 PUFFS BY MOUTH EVERY 6 HOURS AS NEEDED FOR WHEEZING 03/16/18   Brunetta Jeans, PA-C  atorvastatin (LIPITOR) 20 MG tablet TAKE 1 TABLET (20 MG TOTAL) BY MOUTH DAILY. 08/17/18   Brunetta Jeans, PA-C  budesonide-formoterol (SYMBICORT) 80-4.5 MCG/ACT inhaler Inhale 2 puffs into the lungs 2 (two) times daily. 04/09/18   Brunetta Jeans, PA-C  cyclobenzaprine (FLEXERIL) 10 MG tablet Take 1 tablet (10 mg total) by mouth 2 (two) times daily as needed for muscle spasms. 08/22/18   Recardo Evangelist, PA-C  diazepam (VALIUM) 5 MG tablet TAKE 1 TABLET BY  MOUTH TWICE DAILY 08/10/18   Brunetta Jeans, PA-C  diphenhydrAMINE (BENADRYL) 25 MG tablet Take 25 mg by mouth every 6 (six) hours as needed for itching or allergies.    [provider]  escitalopram (LEXAPRO) 10 MG tablet TAKE 1 TABLET (10 MG TOTAL) BY MOUTH DAILY. 08/18/18   Brunetta Jeans, PA-C  fenofibrate (TRICOR) 145 MG tablet TAKE 1 TABLET (145 MG TOTAL) BY MOUTH DAILY. 08/18/18   Brunetta Jeans, PA-C  finasteride (PROPECIA) 1 MG tablet TAKE 1 TABLET (1 MG TOTAL) BY MOUTH  DAILY. 08/18/18   Brunetta Jeans, PA-C  fluticasone (FLONASE) 50 MCG/ACT nasal spray Place 2 sprays into both nostrils daily. 06/18/18   Brunetta Jeans, PA-C  ibuprofen (ADVIL) 600 MG tablet Take 1 tablet (600 mg total) by mouth every 6 (six) hours as needed. 08/22/18   Recardo Evangelist, PA-C  levocetirizine (XYZAL) 5 MG tablet TAKE 1 TABLET (5 MG TOTAL) BY MOUTH EVERY EVENING. 08/17/18   Brunetta Jeans, PA-C  metoprolol succinate (TOPROL-XL) 25 MG 24 hr tablet Take 1 tablet (25 mg total) by mouth daily. 04/07/18   Brunetta Jeans, PA-C  montelukast (SINGULAIR) 10 MG tablet Take 1 tablet (10 mg total) by mouth at bedtime. 07/08/18   Brunetta Jeans, PA-C  Multiple Vitamin (MULTIVITAMIN WITH MINERALS) TABS tablet Take 1 tablet by mouth daily.    [provider]  omeprazole (PRILOSEC) 20 MG capsule TAKE 1 CAPSULE (20 MG TOTAL) BY MOUTH DAILY. 08/17/18   Brunetta Jeans, PA-C  promethazine (PHENERGAN) 25 MG tablet TAKE 1 TABLET BY MOUTH EVERY 8 HOURS AS NEEDED FOR NAUSEA OR VOMITING 06/29/18   Brunetta Jeans, PA-C  temazepam (RESTORIL) 30 MG capsule TAKE 1 CAPSULE (30 MG TOTAL) BY MOUTH AT BEDTIME AS NEEDED FOR SLEEP. 08/10/18   Brunetta Jeans, PA-C  thiamine (VITAMIN B-1) 100 MG tablet Take 100 mg by mouth daily.    [provider]  triamterene-hydrochlorothiazide (DYAZIDE) 37.5-25 MG capsule TAKE 1 EACH (1 CAPSULE TOTAL) BY MOUTH DAILY. 07/22/18   Brunetta Jeans, PA-C  VASCEPA 1 g CAPS TAKE 2 CAPSULES BY MOUTH TWICE A DAY 06/01/18   Brunetta Jeans, PA-C    Family History Family History  Problem Relation Age of Onset   Hyperlipidemia Father        Living   Stroke Father    Hypertension Father    Alcohol abuse Mother        Living   Diabetes Mellitus II Maternal Grandfather    Hypertension Maternal Grandfather    Heart failure Maternal Grandfather    Kidney disease Maternal Grandfather    Heart disease Maternal Grandmother    Melanoma Paternal  Grandmother    Heart attack Paternal Grandfather    Migraines Brother    Healthy Brother        x2   Drug abuse Sister        Died of Overdose    Social History Social History   Tobacco Use   Smoking status: Never Smoker   Smokeless tobacco: Never Used  Substance Use Topics   Alcohol use: Yes    Alcohol/week: 0.0 standard drinks    Comment: 2-3 beers nightly   Drug use: No     Allergies   Augmentin [amoxicillin-pot clavulanate], Belsomra [suvorexant], and Percocet [oxycodone-acetaminophen]   Review of Systems Review of Systems  Constitutional: Positive for activity change.  Musculoskeletal: Positive for back pain.  Neurological: Positive for numbness.  Saw Dr. Vallery Ridge you never given your clinic   Physical Exam Updated Vital Signs BP (!) 152/107    Pulse (!) 109    Temp 98.1 F (36.7 C) (Oral)    Resp 16    SpO2 100%   Physical Exam Vitals signs and nursing note reviewed.  Constitutional:      Appearance: He is well-developed.  HENT:     Head: Atraumatic.  Neck:     Musculoskeletal: Neck supple.  Cardiovascular:     Rate and Rhythm: Normal rate.  Pulmonary:     Effort: Pulmonary effort is normal.  Musculoskeletal:     Comments: Pt has tenderness over the lower lumbar and sacral region No step offs, no erythema. Pt has 2+ patellar reflex bilaterally. Able to discriminate between sharp and dull, patient has subjective numbness over the right lower extremity.  Within the right lower extremity it seems like the numbness is worse over the medial aspect compared to the lateral aspect. Able to ambulate   Skin:    General: Skin is warm.  Neurological:     Mental Status: He is alert and oriented to person, place, and time.      ED Treatments / Results  Labs (all labs ordered are listed, but only abnormal results are displayed) Labs Reviewed - No data to display  EKG None  Radiology Ct Lumbar Spine Wo Contrast  Result Date:  08/27/2018 CLINICAL DATA:  33 year old male status post workplace injury 4 days ago. Blunt trauma. Pain radiating to the right leg with numbness and tingling. EXAM: CT LUMBAR SPINE WITHOUT CONTRAST TECHNIQUE: Multidetector CT imaging of the lumbar spine was performed without intravenous contrast administration. Multiplanar CT image reconstructions were also generated. COMPARISON:  None. FINDINGS: Segmentation: Normal. Alignment: Mild grade 1 anterolisthesis of L5 on S1. Similar mild retrolisthesis of L4 on L5. Mild straightening of lumbar lordosis elsewhere. Vertebrae: Chronic bilateral L5 pars fractures. No acute osseous abnormality identified. Intact visible sacrum and SI joints. Degenerative right T12 costovertebral spurring. Paraspinal and other soft tissues: Visualized abdominal viscera and paraspinal soft tissues are within normal limits. Partially visible urinary bladder distension. Disc levels: T12-L1:  Negative. L1-L2:  Negative. L2-L3:  Negative. L3-L4: Mild disc bulge and posterior element hypertrophy. Borderline to mild spinal stenosis suspected. L4-L5: Disc space loss. Circumferential disc bulge with left paracentral component suspected (series 11, image 94). Mild endplate spurring. Mild posterior element hypertrophy. Probable left greater than right lateral recess stenosis. No convincing spinal or foraminal stenosis. However, there does appear to be asymmetric right extraforaminal disc (same image). L5-S1: Chronic pars fractures. Mild disc bulge. Moderate posterior element hypertrophy. Mild left L5 foraminal stenosis. IMPRESSION: 1. The symptomatic level is difficult to determine. There might be a right side extra-foraminal disc protrusion at L4-L5. Query right L4 radiculitis. But otherwise at that level the left lateral recess appears stenotic. 2. Chronic L5 pars fractures with mild L5-S1 grade 1 anterolisthesis. 3. Borderline to mild multifactorial spinal stenosis also suspected at L3-L4.  Electronically Signed   By: Genevie Ann M.D.   On: 08/27/2018 00:54   Mr Lumbar Spine Wo Contrast  Result Date: 08/27/2018 CLINICAL DATA:  33 year old male status post workplace injury 4 days ago. Blunt trauma. Pain radiating to the right leg with numbness and tingling. EXAM: MRI LUMBAR SPINE WITHOUT CONTRAST TECHNIQUE: Multiplanar, multisequence MR imaging of the lumbar spine was performed. No intravenous contrast was administered. COMPARISON:  Lumbar CT earlier tonight. FINDINGS: Segmentation:  Normal by CT. Alignment: Stable mild  retrolisthesis at L4-L5 and anterolisthesis at L5-S1 from the earlier CT. Overall mild straightening of lumbar lordosis. Vertebrae: Chronic L5 pars fractures better demonstrated by CT. No marrow edema or evidence of acute osseous abnormality. Visualized bone marrow signal is within normal limits. Intact visible sacrum and SI joints. Conus medullaris and cauda equina: Conus extends to the L1-L2 level. No lower spinal cord or conus signal abnormality. Paraspinal and other soft tissues: Negative aside from distended urinary bladder. Disc levels: T12-L1:  Negative. L1-L2:  Negative. L2-L3:  Negative. L3-L4: Negative disc. Mild facet and ligament flavum hypertrophy. No stenosis. L4-L5: Mild retrolisthesis. Disc desiccation and disc space loss. Mild circumferential disc bulge, and [place] central and slightly caudal disc protrusion in the midline (series 5, image 8 and series 8 image 24. Mild posterior element hypertrophy. No spinal or lateral recess stenosis. There is broad-based right extraforaminal disc osteophyte complex (series 8, image 23), inseparable from the exiting right L4 nerve. Otherwise no foraminal stenosis. L5-S1: Grade 1 anterolisthesis with chronic L5 pars fractures and epidural lipomatosis. No spinal or lateral recess stenosis. Mild left L5 foraminal stenosis, no right foraminal stenosis. IMPRESSION: 1. L4-L5 disc degeneration in the setting of mild retrolisthesis with a [place]  disc protrusion in the midline, and right extraforaminal disc osteophyte complex. Query right L4 radiculitis. No spinal stenosis or other convincing neural impingement. 2. L5-S1 grade 1 anterolisthesis with chronic pars fractures and epidural lipomatosis. Mild left L5 foraminal stenosis. 3. Mildly distended urinary bladder. Electronically Signed   By: Genevie Ann M.D.   On: 08/27/2018 02:20    Procedures Procedures (including critical care time)  Medications Ordered in ED Medications  naproxen (NAPROSYN) tablet 500 mg (has no administration in time range)  naproxen (NAPROSYN) tablet 500 mg (500 mg Oral Given 08/27/18 0109)  acetaminophen (TYLENOL) tablet 650 mg (650 mg Oral Given 08/27/18 0109)     Initial Impression / Assessment and Plan / ED Course  I have reviewed the triage vital signs and the nursing notes.  Pertinent labs & imaging results that were available during my care of the patient were reviewed by me and considered in my medical decision making (see chart for details).        33 year old comes in a chief complaint of back pain He had a work-related injury few days ago, and is having excruciating pain in his back with some numbness and tingling in both of his legs, right worse than left.  No red flags on history suggesting cord compression.  Suspect radiculopathy or disc injury.  CT scan was ordered and it does not show any acute findings.   Patient in great discomfort because of his symptoms.  We will proceed with MRI, as it seems like patient's pain management is impaired because of lack of MRI and further recovery with things like PT is also delayed because of lack of proper diagnosis.  4:20 AM The patient appears reasonably screened and/or stabilized for discharge and I doubt any other medical condition or other [facility] requiring further screening, evaluation, or treatment in the ED at this time prior to discharge.   Results from the ER workup discussed with the patient face  to face and all questions answered to the best of my ability. The patient is safe for discharge with strict return precautions.   Final Clinical Impressions(s) / ED Diagnoses   Final diagnoses:  Degeneration of lumbar or lumbosacral intervertebral disc    ED Discharge Orders  Ordered    acetaminophen (TYLENOL 8 HOUR) 650 MG CR tablet  Every 8 hours PRN     08/27/18 0359           Varney Biles, MD 08/27/18 [phone]

## 2018-08-27 NOTE — ED Notes (Signed)
Pt returned from mri  He is contacting his family

## 2018-08-27 NOTE — ED Notes (Signed)
Pt c/o pain in his lower back and his rt leg since an accident on the [employer] truck Saturday  No relief from pain since then

## 2018-08-27 NOTE — Discharge Instructions (Signed)
MRI confirms disk protrusion at L4-L5 and L5-S1. It is not severe, but likely leading to the symptoms you are having.  Take the medicines prescribed for anti-inflammatory and pain control.

## 2018-08-28 MED FILL — MELOXICAM 15 MG TABLET: 15 | 30 days supply | Qty: 30 | Fill #0

## 2018-08-28 MED FILL — METHYLPREDNISOLONE 4 MG TAB: 4 | 6 days supply | Qty: 21 | Fill #0

## 2018-08-28 MED FILL — CYCLOBENZAPRINE 5 MG TABLET: 5 | 30 days supply | Qty: 60 | Fill #0

## 2018-08-28 MED FILL — METHOCARBAMOL 500 MG TABS: 500 | 30 days supply | Qty: 60 | Fill #0

## 2018-09-02 ENCOUNTER — Ambulatory Visit: Payer: PRIVATE HEALTH INSURANCE | Attending: Orthopedic Surgery | Admitting: Physical Therapy

## 2018-09-02 ENCOUNTER — Other Ambulatory Visit: Payer: Self-pay

## 2018-09-02 DIAGNOSIS — M5416 Radiculopathy, lumbar region: Secondary | ICD-10-CM | POA: Insufficient documentation

## 2018-09-02 NOTE — Patient Instructions (Signed)
Access Code: [email or web address]  URL: [email or web address]  Date: 09/02/2018  Prepared by: Lyndee Hensen   Exercises Lying Prone with 1 Pillow Lying Prone Static Prone on Elbows - 1-5 min hold - 3x daily Prone Press Up on Elbows - 10 reps - 1 sets - 4x daily

## 2018-09-02 NOTE — Therapy (Signed)
[place], [place], [address] Phone: [phone]   Fax:  [phone]  Physical Therapy Evaluation  Patient Details  Name: Steve Jacobs MRN: [id number] Date of Birth: 1985-09-28 Referring Provider (PT): Phylliss Bob   Encounter Date: 09/02/2018  PT End of Session - 09/02/18 1229    Visit Number  1    Number of Visits  12    Date for PT Re-Evaluation  10/14/18    PT Start Time  6270    PT Stop Time  1216    PT Time Calculation (min)  41 min    Activity Tolerance  Patient limited by pain    Behavior During Therapy  [facility] for tasks assessed/performed       Past Medical History:  Diagnosis Date  . Asthma   . Chicken pox   . Depression    Resolved  . Environmental and seasonal allergies   . Essential hypertension, benign 08/03/2013  . Hyperlipidemia   . Meniere's disease   . Thoracic outlet syndrome     Past Surgical History:  Procedure Laterality Date  . Nevus Biopsy      There were no vitals filed for this visit.   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 He was holding in front of him, and it fell, pulling him forward. He has had severe pain, just finished prednesone, which he feels helped some. DId have significant R LE symtoms, now a bit better. Now out of work for 4 weeks. He states significant difficulty with all movement and motions, and difficulty getting comfortable in any position. He has not had previous back pain.    Limitations  Sitting;Lifting;Standing;Walking;House hold activities    Diagnostic tests  CT and MRI    Patient Stated Goals  Decreased pain, regain function.    Currently in Pain?  Yes    Pain Score  5     Pain Location  Back    Pain Orientation  Right;Left    Pain Descriptors / Indicators  Aching    Pain Type  Acute pain    Pain Radiating Towards  R LE,  mild in L LE at times.    Pain Onset  1 to 4 weeks ago    Pain Frequency  Constant    Aggravating Factors   most All movement    Pain Relieving Factors  Unable to find comfort         [facility] PT Assessment - 09/02/18 0001      Assessment   Medical Diagnosis  lumbar radiculophy     Referring Provider (PT)  Phylliss Bob    Onset Date/Surgical Date  08/22/18    Prior Therapy  For Ankle      Balance Screen   Has the patient fallen in the past 6 months  No      Prior Function   Level of Independence  Independent      Cognition   Overall Cognitive Status  Within Functional Limits for tasks assessed      ROM / Strength   AROM / PROM / Strength  AROM;Strength      AROM   Overall AROM Comments  Lumbar: significant limitation,  Hips: WFL;        Strength   Overall Strength Comments  Hips: 4-/5, R hip flexion: 3-/5  (partially due to pain) :  knee: 4/5       Palpation   Palpation comment  Tenderness to central lumbar region, and Bil SI      Special Tests   Other special tests  + SLR on R;       Ambulation/Gait   Gait Comments  slower, cautious gait, due to pain                 Objective measurements completed on examination: See above findings.      [facility] Adult PT Treatment/Exercise - 09/02/18 0001      Exercises   Exercises  Lumbar      Lumbar Exercises: Prone   Other Prone Lumbar Exercises  Prone on 1 pillow x 3 min, Prone laying flat x2 min, Prone on elbows x2 min    Other Prone Lumbar Exercises  Prone press ups x10 ( to elbows)       Manual Therapy   Manual Therapy  Joint mobilization;Passive ROM;Manual Traction    Manual Traction  Long leg distraction x3 min bil, for lumbar pump             PT Education - 09/02/18 1229    Education Details  PT POC, HEP, exam findings .    Person(s) Educated  Patient    Methods  Explanation    Comprehension  Verbalized understanding       PT Short Term Goals - 09/02/18 1233      PT SHORT TERM GOAL #1    Title  Pt to be independent with initial HEP    Time  2    Period  Weeks    Status  New    Target Date  09/16/18      PT SHORT TERM GOAL #2   Title  Pt to demo decreased pain in back and LE to be 3/10    Time  2    Period  Weeks    Status  New    Target Date  10/14/18        PT Long Term Goals - 09/02/18 1234      PT LONG TERM GOAL #1   Title  Pt to be independent with final HEP    Time  6    Period  Weeks    Status  New    Target Date  10/14/18      PT LONG TERM GOAL #2   Title  Pt to report decreased pain in back and LE to 0-2/10 with activity.    Time  6    Period  Weeks    Status  New    Target Date  10/14/18      PT LONG TERM GOAL #3   Title  Pt to demo improved Lumbar ROM to be [facility] for pt age.    Time  6    Period  Weeks    Status  New    Target Date  10/14/18      PT LONG TERM GOAL #4   Title  Pt to demo increased strength of Bil LEs and core to at least 4+/5 to improve stability and ability for return to work.    Time  6    Period  Weeks    Status  New    Target Date  10/14/18      PT LONG TERM GOAL #5   Title  Pt to demo ability for proper lift, squat technique, for at least 25 lb, to improve ability for  work duties.    Time  6    Period  Weeks    Status  New    Target Date  10/14/18             Plan - 09/02/18 1252    Clinical Impression Statement  Pt presents with primary complaint of increased pain in low back and into LEs R>L.  Symptoms in R LE have improved somewhat since onset. Pt with significnat discomfort with most all motions, with significant ROM limitations due to pain. Pt with + SLR on R. He has decreased strength of Bil hips, R>L, somewhat due to pain. He also has decreased ability and endurance for standing, due to pain and weakness feeling in LEs. He likely has disc involvement, and will benefit from extension bias exercise, if he is getting some relief from todays session. Pt with signifcant decrease in ability for functional  activities, ADLS, IADLs, walking, and unable to perform work duties at this time. pt to benefit from skilled PT to improve deficits and pain.    Personal Factors and Comorbidities  Profession    Examination-Activity Limitations  Bathing;Locomotion Level;Transfers;Bend;Sit;Sleep;Carry;Squat;Stairs;Dressing;Stand;Lift;Bed Mobility    Examination-Participation Restrictions  Meal Prep;Cleaning;Community Activity;Driving;Laundry    Stability/Clinical Decision Making  Evolving/Moderate complexity    Clinical Decision Making  Moderate    Rehab Potential  Good    PT Frequency  2x / week    PT Duration  6 weeks    PT Treatment/Interventions  ADLs/Self Care Home Management;Canalith Repostioning;Cryotherapy;Electrical Stimulation;DME Instruction;Ultrasound;Traction;Moist Heat;Iontophoresis 4mg /ml Dexamethasone;Gait training;Stair training;Functional mobility training;Therapeutic activities;Therapeutic exercise;Balance training;Neuromuscular re-education;Manual techniques;Orthotic Fit/Training;Patient/family education;Passive range of motion;Dry needling;Energy conservation;Taping;Spinal Manipulations;Joint Manipulations    PT Next Visit Plan  Progress prone bias ther ex as able,  Manual for pain.    PT Home Exercise Plan  [email or web address]    Consulted and Agree with Plan of Care  Patient       Patient will benefit from skilled therapeutic intervention in order to improve the following deficits and impairments:  Abnormal gait, Decreased range of motion, Difficulty walking, Increased muscle spasms, Decreased endurance, Decreased activity tolerance, Pain, Improper body mechanics, Impaired flexibility, Hypomobility, Decreased mobility, Decreased strength  Visit Diagnosis: 1. Radiculopathy, lumbar region        Problem List Patient Active Problem List   Diagnosis Date Noted  . Gastroesophageal reflux disease without esophagitis 06/19/2017  . Need for hepatitis A vaccination 06/19/2017  . Primary insomnia  02/02/2017  . Routine screening for STI (sexually transmitted infection) 06/07/2016  . SVT (supraventricular tachycardia) ([place]) 10/08/2014  . Depression 10/08/2014  . Atypical nevi 06/22/2014  . Elevated triglycerides with high cholesterol 09/06/2013  . Anxiety state 08/03/2013  . Essential hypertension, benign 08/03/2013  . Environmental allergies 03/23/2013  . Visit for preventive health examination 03/23/2013  . Meniere disease 03/23/2013  . Intrinsic asthma      Lyndee Hensen, PT, DPT 1:12 PM  09/02/18   [facility] Outpatient Rehabilitation [facility] [address] [place], [place], [address] Phone: [phone]   Fax:  [phone]  Name: Steve Jacobs MRN: [id number] Date of Birth: 08-17-1985

## 2018-09-04 ENCOUNTER — Ambulatory Visit: Payer: PRIVATE HEALTH INSURANCE | Admitting: Physical Therapy

## 2018-09-07 MED FILL — traMADol HCL 50 MG TABS: 50 | 20 days supply | Qty: 60 | Fill #0

## 2018-09-09 ENCOUNTER — Other Ambulatory Visit: Payer: Self-pay

## 2018-09-09 ENCOUNTER — Encounter: Payer: Self-pay | Admitting: Physical Therapy

## 2018-09-09 ENCOUNTER — Ambulatory Visit: Payer: PRIVATE HEALTH INSURANCE | Attending: Orthopedic Surgery | Admitting: Physical Therapy

## 2018-09-09 DIAGNOSIS — M5416 Radiculopathy, lumbar region: Secondary | ICD-10-CM | POA: Insufficient documentation

## 2018-09-09 NOTE — Patient Instructions (Signed)
Access Code: [email or web address]  URL: [email or web address]  Date: 09/09/2018  Prepared by: Lyndee Hensen   Exercises Lying Prone with 1 Pillow Lying Prone Static Prone on Elbows - 1-5 min hold - 3x daily Prone Press Up on Elbows - 10 reps - 1 sets - 4x daily Left Standing Lateral Shift Correction at Wall - Repetitions - 10 reps - 2 sets - 2x daily Standing Back Extension at Wall - 10 reps - 2 sets - 2x daily Supine Transversus Abdominis Bracing - Hands on Ground - 10 reps - 1 sets - 2x daily

## 2018-09-09 NOTE — Therapy (Signed)
[place], [place], [address] Phone: [phone]   Fax:  [phone]  Physical Therapy Treatment  Patient Details  Name: Steve Jacobs MRN: [id number] Date of Birth: [birth year] Referring Provider (PT): Phylliss Bob   Encounter Date: 09/09/2018  PT End of Session - 09/09/18 1622    Visit Number  2    Number of Visits  12    Date for PT Re-Evaluation  10/14/18    PT Start Time  [id number]    PT Stop Time  1616    PT Time Calculation (min)  41 min    Activity Tolerance  Patient limited by pain    Behavior During Therapy  [facility] for tasks assessed/performed       Past Medical History:  Diagnosis Date  . Asthma   . Chicken pox   . Depression    Resolved  . Environmental and seasonal allergies   . Essential hypertension, benign 08/03/2013  . Hyperlipidemia   . Meniere's disease   . Thoracic outlet syndrome     Past Surgical History:  Procedure Laterality Date  . Nevus Biopsy      There were no vitals filed for this visit.  Subjective Assessment - 09/09/18 1621    Subjective  Pt states that he was in a lot of pain after last session,now has calmed down. Now also taking Tramadol , only when needed.    Currently in Pain?  Yes    Pain Score  6     Pain Location  Back    Pain Orientation  Right;Left    Pain Descriptors / Indicators  Aching    Pain Radiating Towards  R LE    Pain Onset  1 to 4 weeks ago    Pain Frequency  Intermittent                       [facility] Adult PT Treatment/Exercise - 09/09/18 1538      Ambulation/Gait   Gait Comments  --      Exercises   Exercises  Lumbar      Lumbar Exercises: Aerobic   Nustep  L2 x 5 min      Lumbar Exercises: Standing   Other Standing Lumbar Exercises  Standing hip extension x10 bil, pain when standing on R.     Other Standing Lumbar Exercises  Extension in standing x15; Side glides 2x10 to the L;       Lumbar Exercises: Supine   Ab Set  15 reps      Lumbar Exercises: Prone   Other Prone Lumbar Exercises  --    Other Prone Lumbar Exercises  Prone press ups x20       Manual Therapy   Manual Therapy  Joint mobilization;Passive ROM;Manual Traction    Passive ROM  Lateral shift correction     Manual Traction  Long leg distraction x3 min  on L,  for lumbar pump (done on opp side, due to increased pain after last session)              PT Education - 09/09/18 1621    Education Details  HEP updated    Person(s) Educated  Patient    Methods  Explanation    Comprehension  Verbalized understanding;Returned demonstration       PT Short Term Goals - 09/02/18 1233      PT SHORT TERM GOAL #1   Title  Pt to be  independent with initial HEP    Time  2    Period  Weeks    Status  New    Target Date  09/16/18      PT SHORT TERM GOAL #2   Title  Pt to demo decreased pain in back and LE to be 3/10    Time  2    Period  Weeks    Status  New    Target Date  10/14/18        PT Long Term Goals - 09/02/18 1234      PT LONG TERM GOAL #1   Title  Pt to be independent with final HEP    Time  6    Period  Weeks    Status  New    Target Date  10/14/18      PT LONG TERM GOAL #2   Title  Pt to report decreased pain in back and LE to 0-2/10 with activity.    Time  6    Period  Weeks    Status  New    Target Date  10/14/18      PT LONG TERM GOAL #3   Title  Pt to demo improved Lumbar ROM to be [facility] for pt age.    Time  6    Period  Weeks    Status  New    Target Date  10/14/18      PT LONG TERM GOAL #4   Title  Pt to demo increased strength of Bil LEs and core to at least 4+/5 to improve stability and ability for return to work.    Time  6    Period  Weeks    Status  New    Target Date  10/14/18      PT LONG TERM GOAL #5   Title  Pt to demo ability for proper lift, squat technique, for at least 25 lb, to improve ability for work duties.    Time  6    Period  Weeks    Status  New    Target Date   10/14/18            Plan - 09/09/18 1624    Clinical Impression Statement  Pt with good tolerance for ther ex today, does have increased pain and LE pain with increased flexion, sitting, bending. Pt educated on exension and side glides in standing position. Plan to progress as tolerated.    Personal Factors and Comorbidities  Profession    Examination-Activity Limitations  Bathing;Locomotion Level;Transfers;Bend;Sit;Sleep;Carry;Squat;Stairs;Dressing;Stand;Lift;Bed Mobility    Examination-Participation Restrictions  Meal Prep;Cleaning;Community Activity;Driving;Laundry    Stability/Clinical Decision Making  Evolving/Moderate complexity    Rehab Potential  Good    PT Frequency  2x / week    PT Duration  6 weeks    PT Treatment/Interventions  ADLs/Self Care Home Management;Canalith Repostioning;Cryotherapy;Electrical Stimulation;DME Instruction;Ultrasound;Traction;Moist Heat;Iontophoresis 4mg /ml Dexamethasone;Gait training;Stair training;Functional mobility training;Therapeutic activities;Therapeutic exercise;Balance training;Neuromuscular re-education;Manual techniques;Orthotic Fit/Training;Patient/family education;Passive range of motion;Dry needling;Energy conservation;Taping;Spinal Manipulations;Joint Manipulations    PT Next Visit Plan  Progress prone bias ther ex as able,  Manual for pain.    PT Home Exercise Plan  [email or web address]    Consulted and Agree with Plan of Care  Patient       Patient will benefit from skilled therapeutic intervention in order to improve the following deficits and impairments:  Abnormal gait, Decreased range of motion, Difficulty walking, Increased muscle spasms, Decreased endurance, Decreased activity tolerance, Pain, Improper body mechanics, Impaired  flexibility, Hypomobility, Decreased mobility, Decreased strength  Visit Diagnosis: Radiculopathy, lumbar region     Problem List Patient Active Problem List   Diagnosis Date Noted  . Gastroesophageal reflux  disease without esophagitis 06/19/2017  . Need for hepatitis A vaccination 06/19/2017  . 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 Routine screening for STI (sexually transmitted infection) 06/07/2016  . SVT (supraventricular tachycardia) ([place]) 10/08/2014  . Depression 10/08/2014  . Atypical nevi 06/22/2014  . Elevated triglycerides with high cholesterol 09/06/2013  . Anxiety state 08/03/2013  . Essential hypertension, benign 08/03/2013  . Environmental allergies 03/23/2013  . Visit for preventive health examination 03/23/2013  . Meniere disease 03/23/2013  . Intrinsic asthma     Lyndee Hensen, PT, DPT 4:30 PM  09/09/18    [facility] Outpatient Rehabilitation [facility] [address] [place], [place], [address] Phone: [phone]   Fax:  [phone]  Name: Steve Jacobs MRN: [id number] Date of Birth: Dec 23, 1985

## 2018-09-11 ENCOUNTER — Other Ambulatory Visit: Payer: Self-pay

## 2018-09-11 ENCOUNTER — Ambulatory Visit: Payer: PRIVATE HEALTH INSURANCE | Admitting: Physical Therapy

## 2018-09-11 ENCOUNTER — Encounter: Payer: Self-pay | Admitting: Physical Therapy

## 2018-09-11 DIAGNOSIS — M5416 Radiculopathy, lumbar region: Secondary | ICD-10-CM | POA: Diagnosis not present

## 2018-09-11 NOTE — Therapy (Signed)
[place], [place], [address] Phone: [phone]   Fax:  [phone]  Physical Therapy Treatment  Patient Details  Name: Steve Jacobs MRN: [id number] Date of Birth: August 21, 1985 Referring Provider (PT): Phylliss Bob   Encounter Date: 09/11/2018  PT End of Session - 09/11/18 1105    Visit Number  3    Number of Visits  12    Date for PT Re-Evaluation  10/14/18    PT Start Time  1100    PT Stop Time  1142    PT Time Calculation (min)  42 min       Past Medical History:  Diagnosis Date  . Asthma   . Chicken pox   . Depression    Resolved  . Environmental and seasonal allergies   . Essential hypertension, benign 08/03/2013  . Hyperlipidemia   . Meniere's disease   . Thoracic outlet syndrome     Past Surgical History:  Procedure Laterality Date  . Nevus Biopsy      There were no vitals filed for this visit.  Subjective Assessment - 09/11/18 1101    Subjective  Pt reports not as much pain after last session. 2/10 pain yesterday then increased last night and today to 6/10 and he is unsure the cause.    Currently in Pain?  Yes    Pain Score  6     Pain Location  Back    Pain Orientation  Right    Pain Descriptors / Indicators  Aching                       [facility] Adult PT Treatment/Exercise - 09/11/18 0001      Lumbar Exercises: Aerobic   Nustep  L2 x 5 min   pain pushing with RLE      Lumbar Exercises: Standing   Other Standing Lumbar Exercises  Extension in standing x15; Side glides 2x10 to the L;       Lumbar Exercises: Prone   Straight Leg Raise  5 reps    Straight Leg Raises Limitations  alternating ,increased LBP with right LE lift     Other Prone Lumbar Exercises  Prone on 1 pillow x 3 min, Prone laying flat x2 min, Prone on elbows x2 min    Other Prone Lumbar Exercises  Prone press ups5 sec x 10       Modalities   Modalities  Traction      Traction   Type of  Traction  Lumbar    Min (lbs)  50    Max (lbs)  75    Hold Time  60    Rest Time  15    Time  10      Manual Therapy   Manual Traction  Long axis distraction LLE                PT Short Term Goals - 09/02/18 1233      PT SHORT TERM GOAL #1   Title  Pt to be independent with initial HEP    Time  2    Period  Weeks    Status  New    Target Date  09/16/18      PT SHORT TERM GOAL #2   Title  Pt to demo decreased pain in back and LE to be 3/10    Time  2    Period  Weeks    Status  New    Target Date  10/14/18        PT Long Term Goals - 09/02/18 1234      PT LONG TERM GOAL #1   Title  Pt to be independent with final HEP    Time  6    Period  Weeks    Status  New    Target Date  10/14/18      PT LONG TERM GOAL #2   Title  Pt to report decreased pain in back and LE to 0-2/10 with activity.    Time  6    Period  Weeks    Status  New    Target Date  10/14/18      PT LONG TERM GOAL #3   Title  Pt to demo improved Lumbar ROM to be [facility] for pt age.    Time  6    Period  Weeks    Status  New    Target Date  10/14/18      PT LONG TERM GOAL #4   Title  Pt to demo increased strength of Bil LEs and core to at least 4+/5 to improve stability and ability for return to work.    Time  6    Period  Weeks    Status  New    Target Date  10/14/18      PT LONG TERM GOAL #5   Title  Pt to demo ability for proper lift, squat technique, for at least 25 lb, to improve ability for work duties.    Time  6    Period  Weeks    Status  New    Target Date  10/14/18            Plan - 09/11/18 1133    Clinical Impression Statement  More pain today and more difficulty with therex. Cramps is triceps during prone pressups. Extension therex continues to centralize sx. Trial of Lumbar traction today. Some increased LBP at first and then more comfortable on traction. No leg pain during traction. Afterward pain centralized to mid low back.    PT Next Visit Plan  Progress prone  bias ther ex as able,  Manual for pain. further assess response to traction    PT Home Exercise Plan  [facility]       Patient will benefit from skilled therapeutic intervention in order to improve the following deficits and impairments:  Abnormal gait, Decreased range of motion, Difficulty walking, Increased muscle spasms, Decreased endurance, Decreased activity tolerance, Pain, Improper body mechanics, Impaired flexibility, Hypomobility, Decreased mobility, Decreased strength  Visit Diagnosis: Radiculopathy, lumbar region     Problem List Patient Active Problem List   Diagnosis Date Noted  . Gastroesophageal reflux disease without esophagitis 06/19/2017  . Need for hepatitis A vaccination 06/19/2017  . 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 Routine screening for STI (sexually transmitted infection) 06/07/2016  . SVT (supraventricular tachycardia) ([place]) 10/08/2014  . Depression 10/08/2014  . Atypical nevi 06/22/2014  . Elevated triglycerides with high cholesterol 09/06/2013  . Anxiety state 08/03/2013  . Essential hypertension, benign 08/03/2013  . Environmental allergies 03/23/2013  . Visit for preventive health examination 03/23/2013  . Meniere disease 03/23/2013  . Intrinsic asthma     Dorene Ar, [place] 09/11/2018, 11:54 AM  [facility] [address] [place], [place], [address] Phone: [phone]   Fax:  [phone]  Name: TARRON KUEHNER MRN: [id number] Date of Birth: 07/03/1985

## 2018-09-16 ENCOUNTER — Ambulatory Visit: Payer: Self-pay | Admitting: Physical Therapy

## 2018-09-16 ENCOUNTER — Encounter: Payer: Self-pay | Admitting: Physician Assistant

## 2018-09-18 ENCOUNTER — Ambulatory Visit: Payer: Self-pay | Admitting: Physical Therapy

## 2018-09-23 ENCOUNTER — Ambulatory Visit: Payer: Self-pay | Admitting: Physical Therapy

## 2018-09-23 ENCOUNTER — Ambulatory Visit (INDEPENDENT_AMBULATORY_CARE_PROVIDER_SITE_OTHER): Payer: No Typology Code available for payment source | Admitting: Physician Assistant

## 2018-09-23 ENCOUNTER — Other Ambulatory Visit: Payer: Self-pay

## 2018-09-23 ENCOUNTER — Other Ambulatory Visit: Payer: Self-pay | Admitting: Physician Assistant

## 2018-09-23 ENCOUNTER — Encounter: Payer: Self-pay | Admitting: Physician Assistant

## 2018-09-23 VITALS — HR 99 | Temp 98.7°F

## 2018-09-23 DIAGNOSIS — Z20822 Contact with and (suspected) exposure to covid-19: Secondary | ICD-10-CM

## 2018-09-23 DIAGNOSIS — R6889 Other general symptoms and signs: Secondary | ICD-10-CM | POA: Diagnosis not present

## 2018-09-23 MED FILL — diazePAM 5 MG TABS: 5 | 30 days supply | Qty: 60 | Fill #0

## 2018-09-23 MED FILL — VASCEPA 1 GM CAPSULE: 1 | 90 days supply | Qty: 360 | Fill #0

## 2018-09-23 NOTE — Progress Notes (Signed)
Virtual Visit via Video   I connected with patient on 09/23/18 at 11:30 AM EDT by a video enabled telemedicine application and verified that I am speaking with the correct person using two identifiers.  Location patient: Home Location provider: [facility] Persons participating in the virtual visit: Patient, Provider, Bibb (Patina Moore)  I discussed the limitations of evaluation and management by telemedicine and the availability of in person appointments. The patient expressed understanding and agreed to proceed.  Subjective:   HPI:   Patient presents via [facility] today c/o GI symptoms. Noticed last Wednesday (1 week ago) developed nausea, diarrhea, vomiting. Denies melena, hematochezia or tenesmus. Denies any URI symptoms. Did note a mild rash of chest at onset of symptoms that resolved within a day. Was tested Monday for COVID and negative. No vomiting in 3+ days. Notes loose stool now without any frequency. IS hydrating well -- drinking water, Pedialyte and Gatorade. Still with some dry mouth but improving. Has been following a BRAT diet. Appetite slowly starting to return.   ROS:   See pertinent positives and negatives per HPI.  Patient Active Problem List   Diagnosis Date Noted  . Gastroesophageal reflux disease without esophagitis 06/19/2017  . Need for hepatitis A vaccination 06/19/2017  . 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 Routine screening for STI (sexually transmitted infection) 06/07/2016  . SVT (supraventricular tachycardia) ([place]) 10/08/2014  . Depression 10/08/2014  . Atypical nevi 06/22/2014  . Elevated triglycerides with high cholesterol 09/06/2013  . Anxiety state 08/03/2013  . Essential hypertension, benign 08/03/2013  . Environmental allergies 03/23/2013  . Visit for preventive health examination 03/23/2013  . Meniere disease 03/23/2013  . Intrinsic asthma     Social History   Tobacco Use  . Smoking status: Never Smoker  . Smokeless tobacco:  Never Used  Substance Use Topics  . Alcohol use: Yes    Alcohol/week: 0.0 standard drinks    Comment: 2-3 beers nightly    Current Outpatient Medications:  .  acetaminophen (TYLENOL 8 HOUR) 650 MG CR tablet, Take 1 tablet (650 mg total) by mouth every 8 (eight) hours as needed for pain or fever., Disp: 30 tablet, Rfl: 0 .  albuterol (VENTOLIN HFA) 108 (90 Base) MCG/ACT inhaler, INHALE 2 PUFFS BY MOUTH EVERY 6 HOURS AS NEEDED FOR WHEEZING, Disp: 24 g, Rfl: 0 .  atorvastatin (LIPITOR) 20 MG tablet, TAKE 1 TABLET (20 MG TOTAL) BY MOUTH DAILY., Disp: 90 tablet, Rfl: 0 .  budesonide-formoterol (SYMBICORT) 80-4.5 MCG/ACT inhaler, Inhale 2 puffs into the lungs 2 (two) times daily., Disp: 1 Inhaler, Rfl: 6 .  cyclobenzaprine (FLEXERIL) 10 MG tablet, Take 1 tablet (10 mg total) by mouth 2 (two) times daily as needed for muscle spasms., Disp: 20 tablet, Rfl: 0 .  diazepam (VALIUM) 5 MG tablet, TAKE 1 TABLET BY MOUTH TWICE DAILY, Disp: 60 tablet, Rfl: 0 .  diphenhydrAMINE (BENADRYL) 25 MG tablet, Take 25 mg by mouth every 6 (six) hours as needed for itching or allergies., Disp: , Rfl:  .  escitalopram (LEXAPRO) 10 MG tablet, TAKE 1 TABLET (10 MG TOTAL) BY MOUTH DAILY., Disp: 90 tablet, Rfl: 0 .  fenofibrate (TRICOR) 145 MG tablet, TAKE 1 TABLET (145 MG TOTAL) BY MOUTH DAILY., Disp: 90 tablet, Rfl: 0 .  finasteride (PROPECIA) 1 MG tablet, TAKE 1 TABLET (1 MG TOTAL) BY MOUTH DAILY., Disp: 90 tablet, Rfl: 0 .  fluticasone (FLONASE) 50 MCG/ACT nasal spray, Place 2 sprays into both nostrils daily., Disp: 16 g, Rfl:  3 .  ibuprofen (ADVIL) 600 MG tablet, Take 1 tablet (600 mg total) by mouth every 6 (six) hours as needed., Disp: 30 tablet, Rfl: 0 .  levocetirizine (XYZAL) 5 MG tablet, TAKE 1 TABLET (5 MG TOTAL) BY MOUTH EVERY EVENING., Disp: 90 tablet, Rfl: 0 .  metoprolol succinate (TOPROL-XL) 25 MG 24 hr tablet, Take 1 tablet (25 mg total) by mouth daily., Disp: 90 tablet, Rfl: 0 .  montelukast (SINGULAIR) 10  MG tablet, Take 1 tablet (10 mg total) by mouth at bedtime., Disp: 90 tablet, Rfl: 1 .  Multiple Vitamin (MULTIVITAMIN WITH MINERALS) TABS tablet, Take 1 tablet by mouth daily., Disp: , Rfl:  .  omeprazole (PRILOSEC) 20 MG capsule, TAKE 1 CAPSULE (20 MG TOTAL) BY MOUTH DAILY., Disp: 90 capsule, Rfl: 1 .  promethazine (PHENERGAN) 25 MG tablet, TAKE 1 TABLET BY MOUTH EVERY 8 HOURS AS NEEDED FOR NAUSEA OR VOMITING, Disp: 20 tablet, Rfl: 0 .  temazepam (RESTORIL) 30 MG capsule, TAKE 1 CAPSULE (30 MG TOTAL) BY MOUTH AT BEDTIME AS NEEDED FOR SLEEP., Disp: 30 capsule, Rfl: 0 .  thiamine (VITAMIN B-1) 100 MG tablet, Take 100 mg by mouth daily., Disp: , Rfl:  .  triamterene-hydrochlorothiazide (DYAZIDE) 37.5-25 MG capsule, TAKE 1 EACH (1 CAPSULE TOTAL) BY MOUTH DAILY., Disp: 90 capsule, Rfl: 0 .  VASCEPA 1 g CAPS, TAKE 2 CAPSULES BY MOUTH TWICE A DAY, Disp: 360 capsule, Rfl: 1  Allergies  Allergen Reactions  . Augmentin [Amoxicillin-Pot Clavulanate]   . Belsomra [Suvorexant] Other (See Comments)    nightmares  . Percocet [Oxycodone-Acetaminophen] Nausea And Vomiting    Objective:   There were no vitals taken for this visit.  Patient is well-developed, well-nourished in no acute distress.  Resting comfortably at home.  Head is normocephalic, atraumatic.  No labored breathing.  Speech is clear and coherent with logical content.  Patient is alert and oriented at baseline.   Assessment and Plan:   1. Suspected Covid-19 Virus Infection + exposure. GI symptoms associated with COVID infection so suspect false-negative COVID testing. Symptoms are resolving at present. Continue pushing fluids. Start [employer]. Immodium if needed. Can RTW 10 days after symptoms onset as long as symptoms resolved and no fever for 72 hours without antipyretic medication.    Leeanne Rio, PA-C 09/23/2018

## 2018-09-23 NOTE — Telephone Encounter (Signed)
Last refill: 7.27.20 #60, 0 Last OV: 5.28.20 dx. CPE

## 2018-09-23 NOTE — Progress Notes (Signed)
I have discussed the procedure for the virtual visit with the patient who has given consent to proceed with assessment and treatment.   Steve Jacobs, CMA     

## 2018-09-24 ENCOUNTER — Encounter: Payer: Self-pay | Admitting: Physician Assistant

## 2018-09-24 ENCOUNTER — Ambulatory Visit: Payer: No Typology Code available for payment source | Admitting: Physician Assistant

## 2018-09-25 ENCOUNTER — Ambulatory Visit: Payer: Self-pay | Admitting: Physical Therapy

## 2018-09-28 ENCOUNTER — Other Ambulatory Visit: Payer: Self-pay | Admitting: Physician Assistant

## 2018-09-28 NOTE — Telephone Encounter (Signed)
Please advise if refill is appropriate   Last refill: 6.15.20 #20, 0 Last OV: 9.9.20 dx. Suspected COVID patient

## 2018-09-29 MED FILL — METHYLPREDNISOLONE 4 MG TAB: 4 | 6 days supply | Qty: 21 | Fill #0

## 2018-09-30 ENCOUNTER — Ambulatory Visit: Payer: Self-pay | Admitting: Physical Therapy

## 2018-09-30 ENCOUNTER — Other Ambulatory Visit: Payer: Self-pay | Admitting: General Practice

## 2018-09-30 ENCOUNTER — Telehealth: Payer: Self-pay | Admitting: Physical Therapy

## 2018-09-30 ENCOUNTER — Other Ambulatory Visit: Payer: Self-pay | Admitting: Physician Assistant

## 2018-09-30 MED ORDER — PROMETHAZINE HCL 25 MG PO TABS: ORAL_TABLET | ORAL | 0 refills | Status: DC

## 2018-09-30 MED FILL — PROMETHAZINE 25 MG TABLET: 25 | 7 days supply | Qty: 20 | Fill #0

## 2018-09-30 NOTE — Telephone Encounter (Signed)
LVM regarding his recent telephone encounters with his PCP requesting COVID quarantine paperwork for work. Noted per current policy if he is being quarantined for COVID we cannot see him at his scheduled appointment today at 40, and that appointment will be cancelled. If he feels he needs physical therapy we can work on setting him up with telehealth and moving forward with treatment that way.  If he has any questions to call us back.

## 2018-10-01 MED FILL — TEMAZEPAM 30 MG CAPSULE: 30 | 30 days supply | Qty: 30 | Fill #0

## 2018-10-01 MED FILL — ALBUTEROL SULFATE HFA 108 (: 108 (90 BAS | 75 days supply | Qty: 54 | Fill #0

## 2018-10-01 NOTE — Telephone Encounter (Signed)
Last refill: 7.27.20 #30, 0 Last OV: 9.9.20 dx. Suspected COVID infection, 5.28.20 dx. CPE

## 2018-10-02 ENCOUNTER — Ambulatory Visit: Payer: PRIVATE HEALTH INSURANCE | Attending: Physician Assistant | Admitting: Physical Therapy

## 2018-10-02 ENCOUNTER — Encounter: Payer: Self-pay | Admitting: Physical Therapy

## 2018-10-02 ENCOUNTER — Other Ambulatory Visit: Payer: Self-pay

## 2018-10-02 ENCOUNTER — Ambulatory Visit: Payer: PRIVATE HEALTH INSURANCE | Admitting: Physical Therapy

## 2018-10-02 DIAGNOSIS — M5416 Radiculopathy, lumbar region: Secondary | ICD-10-CM | POA: Insufficient documentation

## 2018-10-02 MED FILL — traMADol HCL 50 MG TABS: 50 | 20 days supply | Qty: 60 | Fill #0

## 2018-10-02 NOTE — Therapy (Signed)
[place], [place], [address] Phone: [phone]   Fax:  [phone]  Physical Therapy Treatment  Patient Details  Name: Steve Jacobs MRN: [id number] Date of Birth: 08-22-1985 Referring Provider (PT): Phylliss Bob   Encounter Date: 10/02/2018  PT End of Session - 10/02/18 1221    Visit Number  4    Number of Visits  12    Date for PT Re-Evaluation  10/14/18    PT Start Time  1100    PT Stop Time  1155    PT Time Calculation (min)  55 min    Activity Tolerance  Patient limited by pain    Behavior During Therapy  [facility] for tasks assessed/performed       Past Medical History:  Diagnosis Date  . Asthma   . Chicken pox   . Depression    Resolved  . Environmental and seasonal allergies   . Essential hypertension, benign 08/03/2013  . Hyperlipidemia   . Meniere's disease   . Thoracic outlet syndrome     Past Surgical History:  Procedure Laterality Date  . Nevus Biopsy      There were no vitals filed for this visit.  Subjective Assessment - 10/02/18 1220    Subjective  Patient reports his pain had improved but a few days ago he was mopping when he felt a pull and began having sugnificant symptoms into his right leg. It has imporved a little but is still severe.    Limitations  Sitting;Lifting;Standing;Walking;House hold activities    Diagnostic tests  CT and MRI    Patient Stated Goals  Decreased pain, regain function.    Currently in Pain?  Yes    Pain Score  9     Pain Location  Back    Pain Orientation  Right    Pain Descriptors / Indicators  Aching    Pain Type  Chronic pain    Pain Onset  1 to 4 weeks ago    Pain Frequency  Intermittent    Aggravating Factors   all movements    Pain Relieving Factors  unable to find comofrtable position                       [facility] Adult PT Treatment/Exercise - 10/02/18 0001      Lumbar Exercises: Stretches   Piriformis Stretch  Limitations  glute stretch 3x20 sec hold right     Other Lumbar Stretch Exercise  reviewed tennis ball trigger point release       Lumbar Exercises: Standing   Other Standing Lumbar Exercises  Extension in standing 2x15 yellow       Modalities   Modalities  Moist Heat      Moist Heat Therapy   Number Minutes Moist Heat  10 Minutes    Moist Heat Location  Lumbar Spine      Manual Therapy   Manual Therapy  Joint mobilization;Passive ROM;Manual Traction    Manual therapy comments  IASTYM to lumbar spine in sidelying. Reported the most improvement with this.     Joint Mobilization  side lying Grade 3-4 hip on spine and spine on hip mobilization;     Passive ROM  Lateral shift correction     Manual Traction  Long axis distraction both legs              PT Education - 10/02/18 1221    Education Details  reviewed activity progression  and antomy of condition    Person(s) Educated  Patient    Methods  Explanation;Tactile cues;Verbal cues;Demonstration    Comprehension  Verbalized understanding;Returned demonstration;Verbal cues required       PT Short Term Goals - 10/02/18 1153      PT SHORT TERM GOAL #1   Title  Pt to be independent with initial HEP    Time  2    Period  Weeks    Status  On-going      PT SHORT TERM GOAL #2   Title  Pt to demo decreased pain in back and LE to be 3/10    Time  2    Period  Weeks    Status  On-going        PT Long Term Goals - 09/02/18 1234      PT LONG TERM GOAL #1   Title  Pt to be independent with final HEP    Time  6    Period  Weeks    Status  New    Target Date  10/14/18      PT LONG TERM GOAL #2   Title  Pt to report decreased pain in back and LE to 0-2/10 with activity.    Time  6    Period  Weeks    Status  New    Target Date  10/14/18      PT LONG TERM GOAL #3   Title  Pt to demo improved Lumbar ROM to be [facility] for pt age.    Time  6    Period  Weeks    Status  New    Target Date  10/14/18      PT LONG TERM  GOAL #4   Title  Pt to demo increased strength of Bil LEs and core to at least 4+/5 to improve stability and ability for return to work.    Time  6    Period  Weeks    Status  New    Target Date  10/14/18      PT LONG TERM GOAL #5   Title  Pt to demo ability for proper lift, squat technique, for at least 25 lb, to improve ability for work duties.    Time  6    Period  Weeks    Status  New    Target Date  10/14/18            Plan - 10/02/18 1154    Clinical Impression Statement  Patient tolerated treatment wel. H ehad less pain after his visit. Therapy focusedon manul therapy today. We first worked on coreecting his lateral shift. He was then progressed to side lying spinal mobilizations. He reported centraliztion of symptoms. Therapy then progressed to IASTYM of lumbar spine. After manual therapy he reported no leg symptoms and improved overall movement. Therapy advised patient not to do prone press-ups if he is shifted. He was shown in the mirror what his lateral shift looks like and was shown how to correct at home with the wall. He was given a glut stretch and a core exercise to work on at home. Oretha Caprice will progress him to a core program as tolerated    Personal Factors and Comorbidities  Profession    Examination-Activity Limitations  Bathing;Locomotion Level;Transfers;Bend;Sit;Sleep;Carry;Squat;Stairs;Dressing;Stand;Lift;Bed Mobility    Stability/Clinical Decision Making  Evolving/Moderate complexity    Clinical Decision Making  Moderate    Rehab Potential  Good    PT Frequency  2x /  week    PT Duration  6 weeks    PT Treatment/Interventions  ADLs/Self Care Home Management;Canalith Repostioning;Cryotherapy;Electrical Stimulation;DME Instruction;Ultrasound;Traction;Moist Heat;Iontophoresis 4mg /ml Dexamethasone;Gait training;Stair training;Functional mobility training;Therapeutic activities;Therapeutic exercise;Balance training;Neuromuscular re-education;Manual techniques;Orthotic  Fit/Training;Patient/family education;Passive range of motion;Dry needling;Energy conservation;Taping;Spinal Manipulations;Joint Manipulations    PT Next Visit Plan  Progress prone bias ther ex as able,  Manual for pain. further assess response to traction    PT Home Exercise Plan  [id number]    Consulted and Agree with Plan of Care  Patient       Patient will benefit from skilled therapeutic intervention in order to improve the following deficits and impairments:  Abnormal gait, Decreased range of motion, Difficulty walking, Increased muscle spasms, Decreased endurance, Decreased activity tolerance, Pain, Improper body mechanics, Impaired flexibility, Hypomobility, Decreased mobility, Decreased strength  Visit Diagnosis: Radiculopathy, lumbar region     Problem List Patient Active Problem List   Diagnosis Date Noted  . Gastroesophageal reflux disease without esophagitis 06/19/2017  . Need for hepatitis A vaccination 06/19/2017  . 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 Routine screening for STI (sexually transmitted infection) 06/07/2016  . SVT (supraventricular tachycardia) ([place]) 10/08/2014  . Depression 10/08/2014  . Atypical nevi 06/22/2014  . Elevated triglycerides with high cholesterol 09/06/2013  . Anxiety state 08/03/2013  . Essential hypertension, benign 08/03/2013  . Environmental allergies 03/23/2013  . Visit for preventive health examination 03/23/2013  . Meniere disease 03/23/2013  . Intrinsic asthma     Carney Living PT DPT  10/02/2018, 12:26 PM  [facility] [address] [place], [place], [address] Phone: [phone]   Fax:  [phone]  Name: Steve Jacobs MRN: [id number] Date of Birth: June 23, 1985

## 2018-10-09 ENCOUNTER — Ambulatory Visit: Payer: PRIVATE HEALTH INSURANCE | Admitting: Physical Therapy

## 2018-10-09 ENCOUNTER — Encounter: Payer: Self-pay | Admitting: Physical Therapy

## 2018-10-09 ENCOUNTER — Other Ambulatory Visit: Payer: Self-pay

## 2018-10-09 DIAGNOSIS — M5416 Radiculopathy, lumbar region: Secondary | ICD-10-CM | POA: Diagnosis not present

## 2018-10-09 NOTE — Therapy (Signed)
[place], [place], [address] Phone: [phone]   Fax:  [phone]  Physical Therapy Treatment  Patient Details  Name: Steve Jacobs MRN: [id number] Date of Birth: [birth year] Referring Provider (PT): Phylliss Bob   Encounter Date: 10/09/2018  PT End of Session - 10/09/18 1324    Visit Number  5    Number of Visits  12    Date for PT Re-Evaluation  10/14/18    Authorization Type  Workers comp    PT Start Time  1147    PT Stop Time  1230    PT Time Calculation (min)  43 min    Activity Tolerance  Patient limited by pain    Behavior During Therapy  [facility] for tasks assessed/performed       Past Medical History:  Diagnosis Date  . Asthma   . Chicken pox   . Depression    Resolved  . Environmental and seasonal allergies   . Essential hypertension, benign 08/03/2013  . Hyperlipidemia   . Meniere's disease   . Thoracic outlet syndrome     Past Surgical History:  Procedure Laterality Date  . Nevus Biopsy      There were no vitals filed for this visit.  Subjective Assessment - 10/09/18 1152    Subjective  Patient reports that about 2-3 days ago the pain began to subside. Today he has some pain in the middle of his back.    Limitations  Sitting;Lifting;Standing;Walking;House hold activities    Diagnostic tests  CT and MRI    Patient Stated Goals  Decreased pain, regain function.    Currently in Pain?  Yes    Pain Score  2     Pain Location  Back    Pain Orientation  Right    Pain Descriptors / Indicators  Aching    Pain Type  Chronic pain    Pain Radiating Towards  right LE    Pain Onset  1 to 4 weeks ago    Pain Frequency  Constant    Aggravating Factors   twisting and bending    Pain Relieving Factors  unable to find a comofrabtle position    Effect of Pain on Daily Activities  pin twitcing and bending                       [facility] Adult PT Treatment/Exercise - 10/09/18  0001      Lumbar Exercises: Stretches   Active Hamstring Stretch Limitations  2x20 sec hold bilateral     Lower Trunk Rotation Limitations  x10 in limited motion     Piriformis Stretch Limitations  glute stretch 3x20 sec hold right       Lumbar Exercises: Aerobic   Nustep  L2 x 5 min   pain pushing with RLE      Lumbar Exercises: Seated   Other Seated Lumbar Exercises  1/4 slump able to go further with right leg out then left       Lumbar Exercises: Supine   AB Set Limitations  reviewed breathing     Pelvic Tilt Limitations  x10 with breathing       Lumbar Exercises: Prone   Other Prone Lumbar Exercises  advised to try laying on his stomach at home but GI limiting ability today      Manual Therapy   Manual Therapy  Joint mobilization;Passive ROM;Manual Traction    Manual therapy comments  IASTYM to  lumbar spine in sidelying. Reported the most improvement with this.     Manual Traction  Long axis distraction both legs              PT Education - 10/09/18 1210    Education Details  reviewed improvement of neural tension    Person(s) Educated  Patient    Methods  Explanation;Demonstration;Tactile cues;Verbal cues    Comprehension  Verbalized understanding;Returned demonstration;Verbal cues required;Tactile cues required       PT Short Term Goals - 10/09/18 1328      PT SHORT TERM GOAL #1   Title  Pt to be independent with initial HEP    Baseline  independent with current HEP    Time  2    Period  Weeks    Status  Achieved    Target Date  09/16/18      PT SHORT TERM GOAL #2   Title  Pt to demo decreased pain in back and LE to be 3/10    Baseline  2/10 today    Period  Weeks    Status  Achieved    Target Date  10/14/18        PT Long Term Goals - 09/02/18 1234      PT LONG TERM GOAL #1   Title  Pt to be independent with final HEP    Time  6    Period  Weeks    Status  New    Target Date  10/14/18      PT LONG TERM GOAL #2   Title  Pt to report  decreased pain in back and LE to 0-2/10 with activity.    Time  6    Period  Weeks    Status  New    Target Date  10/14/18      PT LONG TERM GOAL #3   Title  Pt to demo improved Lumbar ROM to be [facility] for pt age.    Time  6    Period  Weeks    Status  New    Target Date  10/14/18      PT LONG TERM GOAL #4   Title  Pt to demo increased strength of Bil LEs and core to at least 4+/5 to improve stability and ability for return to work.    Time  6    Period  Weeks    Status  New    Target Date  10/14/18      PT LONG TERM GOAL #5   Title  Pt to demo ability for proper lift, squat technique, for at least 25 lb, to improve ability for work duties.    Time  6    Period  Weeks    Status  New    Target Date  10/14/18            Plan - 10/09/18 1324    Clinical Impression Statement  Patient reports when he bends he feels a pulling in his back then increased radicular symptoms. He likley continues to have increased neural tension. Therapy showed him light neural tension activity. He ewas advised if it makes his symptoms woser to stop. He was also shown some general stretching and core strengthening today. His radiuclar symptoms have improved significantly. He continues to have spasming in his lowr umbar area L>R. He was advised to continue with his stretching and strengthening at home and he is now back on a more steady treatment plan.  Examination-Activity Limitations  Bathing;Locomotion Level;Transfers;Bend;Sit;Sleep;Carry;Squat;Stairs;Dressing;Stand;Lift;Bed Mobility    Examination-Participation Restrictions  Meal Prep;Cleaning;Community Activity;Driving;Laundry    Stability/Clinical Decision Making  Evolving/Moderate complexity    Clinical Decision Making  Moderate    Rehab Potential  Good    PT Frequency  2x / week    PT Duration  6 weeks    PT Treatment/Interventions  ADLs/Self Care Home Management;Canalith Repostioning;Cryotherapy;Electrical Stimulation;DME  Instruction;Ultrasound;Traction;Moist Heat;Iontophoresis 4mg /ml Dexamethasone;Gait training;Stair training;Functional mobility training;Therapeutic activities;Therapeutic exercise;Balance training;Neuromuscular re-education;Manual techniques;Orthotic Fit/Training;Patient/family education;Passive range of motion;Dry needling;Energy conservation;Taping;Spinal Manipulations;Joint Manipulations    PT Next Visit Plan  continue with current plan. Adssess tolerance to nrual tension stretch; continue to progress core strengthening    PT Home Exercise Plan  neural tension stretch; pirifromis stretch, PPT    Consulted and Agree with Plan of Care  Patient       Patient will benefit from skilled therapeutic intervention in order to improve the following deficits and impairments:  Abnormal gait, Decreased range of motion, Difficulty walking, Increased muscle spasms, Decreased endurance, Decreased activity tolerance, Pain, Improper body mechanics, Impaired flexibility, Hypomobility, Decreased mobility, Decreased strength  Visit Diagnosis: Radiculopathy, lumbar region     Problem List Patient Active Problem List   Diagnosis Date Noted  . Gastroesophageal reflux disease without esophagitis 06/19/2017  . Need for hepatitis A vaccination 06/19/2017  . 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 Routine screening for STI (sexually transmitted infection) 06/07/2016  . SVT (supraventricular tachycardia) ([place]) 10/08/2014  . Depression 10/08/2014  . Atypical nevi 06/22/2014  . Elevated triglycerides with high cholesterol 09/06/2013  . Anxiety state 08/03/2013  . Essential hypertension, benign 08/03/2013  . Environmental allergies 03/23/2013  . Visit for preventive health examination 03/23/2013  . Meniere disease 03/23/2013  . Intrinsic asthma     Carney Living PT DPT  10/09/2018, 1:31 PM  [facility] [address] [place], [place], [address] Phone: [phone]    Fax:  [phone]  Name: Steve Jacobs MRN: [id number] Date of Birth: 01-Aug-1985

## 2018-10-14 MED FILL — CYCLOBENZAPRINE 5 MG TABLET: 5 | 30 days supply | Qty: 60 | Fill #0

## 2018-10-15 ENCOUNTER — Ambulatory Visit: Payer: PRIVATE HEALTH INSURANCE | Admitting: Physical Therapy

## 2018-10-20 ENCOUNTER — Other Ambulatory Visit: Payer: Self-pay

## 2018-10-20 ENCOUNTER — Encounter: Payer: Self-pay | Admitting: Physical Therapy

## 2018-10-20 ENCOUNTER — Ambulatory Visit: Payer: PRIVATE HEALTH INSURANCE | Attending: Orthopedic Surgery | Admitting: Physical Therapy

## 2018-10-20 DIAGNOSIS — M5416 Radiculopathy, lumbar region: Secondary | ICD-10-CM

## 2018-10-20 NOTE — Therapy (Signed)
[place], [place], [address] Phone: [phone]   Fax:  [phone]  Physical Therapy Treatment/progress note   Patient Details  Name: Steve Jacobs MRN: [id number] Date of Birth: October 04, 1985 Referring Provider (PT): Dr Phylliss Bob   Encounter Date: 10/20/2018  PT End of Session - 10/20/18 1124    Visit Number  6    Number of Visits  9    Date for PT Re-Evaluation  11/17/18    Authorization Type  Workers comp 3 more visits approved 10/20/2018    PT Start Time  1100    PT Stop Time  1144    PT Time Calculation (min)  44 min    Activity Tolerance  Patient limited by pain    Behavior During Therapy  [facility] for tasks assessed/performed       Past Medical History:  Diagnosis Date  . Asthma   . Chicken pox   . Depression    Resolved  . Environmental and seasonal allergies   . Essential hypertension, benign 08/03/2013  . Hyperlipidemia   . Meniere's disease   . Thoracic outlet syndrome     Past Surgical History:  Procedure Laterality Date  . Nevus Biopsy      There were no vitals filed for this visit.  Subjective Assessment - 10/20/18 1106    Subjective  Patient reports his back is stiff. He hasn't had as much pain. Isf he bends and lifts he notices an increase in pain.    Limitations  Sitting;Lifting;Standing;Walking;House hold activities    Diagnostic tests  CT and MRI    Patient Stated Goals  Decreased pain, regain function.    Currently in Pain?  Yes    Pain Score  1     Pain Location  Back    Pain Orientation  Right    Pain Descriptors / Indicators  Aching    Pain Type  Chronic pain    Pain Radiating Towards  right LE    Pain Onset  1 to 4 weeks ago    Pain Frequency  Constant    Aggravating Factors   twitting and bending    Pain Relieving Factors  unable to find a comfortable position    Effect of Pain on Daily Activities  pain with bending         [facility] PT Assessment - 10/20/18  0001      Assessment   Medical Diagnosis  lumbar radiculophy     Referring Provider (PT)  Dr Lynann Bologna, [place]      AROM   AROM Assessment Site  Lumbar    Lumbar Flexion  55 degrees minor pain     Lumbar Extension  10 degrees     Lumbar - Right Side Bend  pain     Lumbar - Right Rotation  mild pain       Strength   Strength Assessment Site  Hip;Knee    Right/Left Hip  Left;Right    Right Hip Flexion  4/5    Right Hip ABduction  4/5    Right Hip ADduction  4/5    Left Hip Flexion  5/5    Left Hip ABduction  5/5    Left Hip ADduction  5/5      Palpation   Palpation comment  smasming around L4 and L5       Ambulation/Gait   Gait Comments  decreased single leg stance time on the right  [facility] PT Treatment/Exercise - 10/20/18 0001      Lumbar Exercises: Stretches   Active Hamstring Stretch Limitations  2x20 sec hold bilateral     Lower Trunk Rotation Limitations  x10 in limited motion     Piriformis Stretch Limitations  glute stretch 3x20 sec hold right       Lumbar Exercises: Aerobic   Nustep  L2 x 5 min   pain pushing with RLE      Lumbar Exercises: Standing   Functional Squats Limitations  reviewed hip hinge. Mod cuing required for proper technique. SAdvised to do just a few reps a few times a day at home.     Theraband Level (Row)  Level 2 (Red)    Row Limitations  2x10 with abdominal breathing       Lumbar Exercises: Supine   Pelvic Tilt Limitations  x10 with breathing     Bent Knee Raise Limitations  2x10     Large Ball Oblique Isometric Limitations  supine clamshell with green 2x10                PT Short Term Goals - 10/20/18 1550      PT SHORT TERM GOAL #1   Title  Pt to be independent with initial HEP    Baseline  independent with current HEP    Time  2    Period  Weeks    Status  Achieved      PT SHORT TERM GOAL #2   Title  Pt to demo decreased pain in back and LE to be 3/10    Baseline  1/10 today    Time   2    Period  Weeks    Status  Achieved        PT Long Term Goals - 10/20/18 1551      PT LONG TERM GOAL #1   Title  Pt to be independent with final HEP    Time  6    Period  Weeks    Status  On-going    Target Date  11/24/18      PT LONG TERM GOAL #2   Title  Pt to report decreased pain in back and LE to 0-2/10 with activity.    Time  6    Period  Weeks    Status  On-going      PT LONG TERM GOAL #3   Title  Pt to demo improved Lumbar ROM to be [facility] for pt age.    Baseline  significant improvement in lumbar ROM    Time  6    Period  Weeks    Status  On-going      PT LONG TERM GOAL #4   Title  Pt to demo increased strength of Bil LEs and core to at least 4+/5 to improve stability and ability for return to work.    Baseline  Strength has improved with all movements    Time  6    Period  Weeks    Status  On-going      PT LONG TERM GOAL #5   Title  Pt to demo ability for proper lift, squat technique, for at least 25 lb, to improve ability for work duties.    Baseline  began basic squat training today    Time  6    Period  Weeks    Status  On-going            Plan - 10/20/18  1404    Clinical Impression Statement  The patient is beggining to make good progress. On his last visit he appeared to be having increased neural tension with functional movments. That appears to have resolved. His plan of care has been intterupted by acute illness and an acute exacerbation of his lower back. Since he has been able to have more consitent treatment his symptoms have improved. Therapy began fucntional strengthening today. He had a mild increase in pain working ona proper 1/4 squat. He was given for home but advised if he is having increased pain he may not be ready. He tolerated all other ther-ex well. He continues to have focal tightness that is notw just around his lumbar spine. He is agreeable to dry needling next visit. He would benefit from further skilled therapy. He has 3 visits  left under workers comp. How mayny he will require will be dependnet on how he respods to strengthening and functional training.    Personal Factors and Comorbidities  Profession    Examination-Activity Limitations  Bathing;Locomotion Level;Transfers;Bend;Sit;Sleep;Carry;Squat;Stairs;Dressing;Stand;Lift;Bed Mobility    Examination-Participation Restrictions  Meal Prep;Cleaning;Community Activity;Driving;Laundry    Clinical Decision Making  Moderate    Rehab Potential  Good    PT Frequency  2x / week    PT Duration  6 weeks    PT Treatment/Interventions  ADLs/Self Care Home Management;Canalith Repostioning;Cryotherapy;Electrical Stimulation;DME Instruction;Ultrasound;Traction;Moist Heat;Iontophoresis 4mg /ml Dexamethasone;Gait training;Stair training;Functional mobility training;Therapeutic activities;Therapeutic exercise;Balance training;Neuromuscular re-education;Manual techniques;Orthotic Fit/Training;Patient/family education;Passive range of motion;Dry needling;Energy conservation;Taping;Spinal Manipulations;Joint Manipulations    PT Next Visit Plan  continue with current plan. Adssess tolerance to nrual tension stretch; continue to progress core strengthening    PT Home Exercise Plan  neural tension stretch; pirifromis stretch, PPT    Consulted and Agree with Plan of Care  Patient       Patient will benefit from skilled therapeutic intervention in order to improve the following deficits and impairments:  Abnormal gait, Decreased range of motion, Difficulty walking, Increased muscle spasms, Decreased endurance, Decreased activity tolerance, Pain, Improper body mechanics, Impaired flexibility, Hypomobility, Decreased mobility, Decreased strength  Visit Diagnosis: Radiculopathy, lumbar region     Problem List Patient Active Problem List   Diagnosis Date Noted  . Gastroesophageal reflux disease without esophagitis 06/19/2017  . Need for hepatitis A vaccination 06/19/2017  . Primary  insomnia 02/02/2017  . Routine screening for STI (sexually transmitted infection) 06/07/2016  . SVT (supraventricular tachycardia) ([place]) 10/08/2014  . Depression 10/08/2014  . Atypical nevi 06/22/2014  . Elevated triglycerides with high cholesterol 09/06/2013  . Anxiety state 08/03/2013  . Essential hypertension, benign 08/03/2013  . Environmental allergies 03/23/2013  . Visit for preventive health examination 03/23/2013  . Meniere disease 03/23/2013  . Intrinsic asthma     Carney Living 10/20/2018, 4:57 PM  [facility] [address] [place], [place], [address] Phone: [phone]   Fax:  [phone]  Name: Steve Jacobs MRN: [id number] Date of Birth: February 01, 1985

## 2018-10-26 ENCOUNTER — Encounter: Payer: Self-pay | Admitting: Physical Therapy

## 2018-10-26 ENCOUNTER — Other Ambulatory Visit: Payer: Self-pay

## 2018-10-26 ENCOUNTER — Ambulatory Visit: Payer: PRIVATE HEALTH INSURANCE | Admitting: Physical Therapy

## 2018-10-26 DIAGNOSIS — M5416 Radiculopathy, lumbar region: Secondary | ICD-10-CM | POA: Diagnosis not present

## 2018-10-26 NOTE — Therapy (Signed)
[place], [place], [address] Phone: [phone]   Fax:  [phone]  Physical Therapy Treatment  Patient Details  Name: Steve Jacobs MRN: [id number] Date of Birth: February 20, 1985 Referring Provider (PT): Dr Phylliss Bob   Encounter Date: 10/26/2018  PT End of Session - 10/26/18 1507    Visit Number  7    Number of Visits  9    Date for PT Re-Evaluation  11/17/18    Authorization Type  Workers comp 3 more visits approved 10/20/2018    PT Start Time  1501    PT Stop Time  1545    PT Time Calculation (min)  44 min    Activity Tolerance  Patient limited by pain    Behavior During Therapy  [facility] for tasks assessed/performed       Past Medical History:  Diagnosis Date  . Asthma   . Chicken pox   . Depression    Resolved  . Environmental and seasonal allergies   . Essential hypertension, benign 08/03/2013  . Hyperlipidemia   . Meniere's disease   . Thoracic outlet syndrome     Past Surgical History:  Procedure Laterality Date  . Nevus Biopsy      There were no vitals filed for this visit.  Subjective Assessment - 10/26/18 1505    Subjective  Patient continues to report stiffness in his back but overall it is doing pretty well. He wasn't able to work on the mini squats without pain. He had a sharp pain when tryiung to tie his shoe today but it overall wasn't too bad.    Limitations  Sitting;Lifting;Standing;Walking;House hold activities    Diagnostic tests  CT and MRI    Patient Stated Goals  Decreased pain, regain function.    Currently in Pain?  No/denies                       [facility] Adult PT Treatment/Exercise - 10/26/18 0001      Therapeutic Activites    Therapeutic Activities  Other Therapeutic Activities;Lifting    Lifting  reviewed box lift 10 lb box 3x5. less cuing required for box lift then the hip hinge       Lumbar Exercises: Stretches   Active Hamstring Stretch  Limitations  2x20 sec hold bilateral     Lower Trunk Rotation Limitations  x10 in limited motion     Piriformis Stretch Limitations  glute stretch 3x20 sec hold right       Lumbar Exercises: Aerobic   Nustep  L2 x 5 min   pain pushing with RLE      Lumbar Exercises: Standing   Functional Squats Limitations  reviewed hip hinge. Mod cuing required for proper technique. SAdvised to do just a few reps a few times a day at home.     Theraband Level (Row)  Level 3 (Green)    Row Limitations  2x10     Other Standing Lumbar Exercises  1/4 hip hinge with mod cuing for technique 4x5       Lumbar Exercises: Seated   Other Seated Lumbar Exercises  clamshell red 2x10       Lumbar Exercises: Supine   Pelvic Tilt Limitations  x10 with breathing     Bent Knee Raise Limitations  2x10 reported pain inhis tailbone       Manual Therapy   Manual therapy comments  IASTYM to lumbar spine in sidelying. Reported the most  improvement with this.     Joint Mobilization  PA glides Grade II to L2 and L4/L5     Manual Traction  Long axis distraction both legs              PT Education - 10/26/18 1506    Education Details  reviewed lifting technique. Reviewed push.pull with lumbar stabilization.    Person(s) Educated  Patient    Methods  Explanation;Demonstration;Tactile cues;Verbal cues    Comprehension  Verbalized understanding;Returned demonstration;Tactile cues required;Verbal cues required       PT Short Term Goals - 10/26/18 1553      PT SHORT TERM GOAL #1   Title  Pt to be independent with initial HEP    Baseline  independent with current HEP    Period  Weeks    Status  Achieved      PT SHORT TERM GOAL #2   Title  Pt to demo decreased pain in back and LE to be 3/10    Baseline  1/10 today    Time  2    Period  Weeks    Status  Achieved      PT SHORT TERM GOAL #3   Title  The patient will have improved proprioception to withstand moderate challenges with soft surfaces and change of  direction 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    Time  6    Period  Weeks    Status  On-going      PT LONG TERM GOAL #3   Title  Pt to demo improved Lumbar ROM to be [facility] for pt age.    Baseline  significant improvement in lumbar ROM    Time  6    Period  Weeks    Status  On-going      PT LONG TERM GOAL #4   Title  Pt to demo increased strength of Bil LEs and core to at least 4+/5 to improve stability and ability for return to work.    Baseline  Strength has improved with all movements    Time  6    Period  Weeks    Status  On-going      PT LONG TERM GOAL #5   Title  Pt to demo ability for proper lift, squat technique, for at least 25 lb, to improve ability for work duties.    Baseline  began basic squat training today    Time  6    Period  Weeks    Status  On-going            Plan - 10/26/18 1528    Clinical Impression Statement  The patients spasming is imprpoving but he is still having pain with some basic ther-ex. He was able to tolerate push pull exercises better then supine exercises. ZHe has been having some trouble with 1/4  squats at home. Therapy reviewed techniuqe with him and he did better. He also showed better technique with    Examination-Activity Limitations  Bathing;Locomotion Level;Transfers;Bend;Sit;Sleep;Carry;Squat;Stairs;Dressing;Stand;Lift;Bed Mobility    Stability/Clinical Decision Making  Evolving/Moderate complexity    Clinical Decision Making  Moderate    Rehab Potential  Good    PT Frequency  2x / week    PT Duration  6 weeks    PT Treatment/Interventions  ADLs/Self Care Home  Management;Canalith Repostioning;Cryotherapy;Electrical Stimulation;DME Instruction;Ultrasound;Traction;Moist Heat;Iontophoresis 4mg /ml Dexamethasone;Gait training;Stair training;Functional mobility training;Therapeutic activities;Therapeutic exercise;Balance training;Neuromuscular re-education;Manual techniques;Orthotic Fit/Training;Patient/family education;Passive range of motion;Dry needling;Energy conservation;Taping;Spinal Manipulations;Joint Manipulations    PT Next Visit Plan  continue with current plan. Adssess tolerance to nrual tension stretch; continue to progress core strengthening    PT Home Exercise Plan  neural tension stretch; pirifromis stretch, PPT    Consulted and Agree with Plan of Care  Patient       Patient will benefit from skilled therapeutic intervention in order to improve the following deficits and impairments:  Abnormal gait, Decreased range of motion, Difficulty walking, Increased muscle spasms, Decreased endurance, Decreased activity tolerance, Pain, Improper body mechanics, Impaired flexibility, Hypomobility, Decreased mobility, Decreased strength  Visit Diagnosis: Radiculopathy, lumbar region     Problem List Patient Active Problem List   Diagnosis Date Noted  . Gastroesophageal reflux disease without esophagitis 06/19/2017  . Need for hepatitis A vaccination 06/19/2017  . 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 Routine screening for STI (sexually transmitted infection) 06/07/2016  . SVT (supraventricular tachycardia) ([place]) 10/08/2014  . Depression 10/08/2014  . Atypical nevi 06/22/2014  . Elevated triglycerides with high cholesterol 09/06/2013  . Anxiety state 08/03/2013  . Essential hypertension, benign 08/03/2013  . Environmental allergies 03/23/2013  . Visit for preventive health examination 03/23/2013  . Meniere disease 03/23/2013  . Intrinsic asthma     Carney Living PT DPT  10/26/2018, 3:56 PM  [facility] [address] [place], [place], [address] Phone: [phone]   Fax:  [phone]  Name: ARRIS RAUTH MRN: [id number] Date of Birth: [birth year]

## 2018-10-27 ENCOUNTER — Other Ambulatory Visit: Payer: Self-pay | Admitting: Physician Assistant

## 2018-10-27 MED FILL — diazePAM 5 MG TABS: 5 | 30 days supply | Qty: 60 | Fill #0

## 2018-10-27 MED FILL — MONTELUKAST SOD 10 MG TAB: 10 | 90 days supply | Qty: 90 | Fill #1

## 2018-10-27 NOTE — Telephone Encounter (Signed)
Last refill: 9.9.20 #60, 0 Last OV: 9.9.20 dx. Suspected COVID

## 2018-10-28 ENCOUNTER — Encounter: Payer: Self-pay | Admitting: Physical Therapy

## 2018-10-28 ENCOUNTER — Other Ambulatory Visit: Payer: Self-pay

## 2018-10-28 ENCOUNTER — Ambulatory Visit: Payer: PRIVATE HEALTH INSURANCE | Attending: Physician Assistant | Admitting: Physical Therapy

## 2018-10-28 DIAGNOSIS — M5416 Radiculopathy, lumbar region: Secondary | ICD-10-CM | POA: Diagnosis not present

## 2018-10-28 NOTE — Therapy (Signed)
[place], [place], [address] Phone: [phone]   Fax:  [phone]  Physical Therapy Treatment  Patient Details  Name: Steve Jacobs MRN: [id number] Date of Birth: 11/28/1985 Referring Provider (PT): Dr Phylliss Bob   Encounter Date: 10/28/2018  PT End of Session - 10/28/18 1107    Visit Number  8    Number of Visits  9    Date for PT Re-Evaluation  11/17/18    Authorization Type  Workers comp 3 more visits approved 10/20/2018    PT Start Time  1100    PT Stop Time  1142    PT Time Calculation (min)  42 min    Activity Tolerance  Patient limited by pain    Behavior During Therapy  [facility] for tasks assessed/performed       Past Medical History:  Diagnosis Date  . Asthma   . Chicken pox   . Depression    Resolved  . Environmental and seasonal allergies   . Essential hypertension, benign 08/03/2013  . Hyperlipidemia   . Meniere's disease   . Thoracic outlet syndrome     Past Surgical History:  Procedure Laterality Date  . Nevus Biopsy      There were no vitals filed for this visit.  Subjective Assessment - 10/28/18 1600    Subjective  Patient cleaned his car today and did some other work. He feels like his back is tight but hedosent have any significant pain. He is only having pain when he is bending down to tie his shoes. He felt a little tightness after the last visit.    Limitations  Sitting;Lifting;Standing;Walking;House hold activities    Currently in Pain?  No/denies                       [facility] Adult PT Treatment/Exercise - 10/28/18 0001      Lumbar Exercises: Stretches   Active Hamstring Stretch Limitations  2x20 sec hold bilateral     Lower Trunk Rotation Limitations  x10 in limited motion     Piriformis Stretch Limitations  glute stretch 3x20 sec hold right       Lumbar Exercises: Aerobic   Nustep  L5 x 5 min   pain pushing with RLE      Lumbar Exercises:  Standing   Functional Squats Limitations  hip hinge going deeper 2x10     Theraband Level (Row)  Level 3 (Green)    Shoulder Extension  20 reps    Shoulder Extension Limitations  with a slight knee bend and cuing for abdominal brace       Lumbar Exercises: Supine   Pelvic Tilt Limitations  x10 with breathing     Bridge Limitations  not able to obtain a full bridge but 2x10     Large Ball Oblique Isometric Limitations  supine clamshell with green 2x10       Manual Therapy   Manual therapy comments  IASTYM to lumbar spine in sidelying. Reported the most improvement with this.     Joint Mobilization  PA glides Grade II to L2 and L4/L5     Manual Traction  Long axis distraction both legs              PT Education - 10/28/18 1608    Education Details  reviewed lifting technique    Person(s) Educated  Patient    Methods  Explanation;Demonstration;Verbal cues;Tactile cues    Comprehension  Verbalized understanding;Returned demonstration       PT Short Term Goals - 10/26/18 1553      PT SHORT TERM GOAL #1   Title  Pt to be independent with initial HEP    Baseline  independent with current HEP    Period  Weeks    Status  Achieved      PT SHORT TERM GOAL #2   Title  Pt to demo decreased pain in back and LE to be 3/10    Baseline  1/10 today    Time  2    Period  Weeks    Status  Achieved      PT SHORT TERM GOAL #3   Title  The patient will have improved proprioception to withstand moderate challenges with soft surfaces and 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    Time  6    Period  Weeks     Status  On-going      PT LONG TERM GOAL #3   Title  Pt to demo improved Lumbar ROM to be [facility] for pt age.    Baseline  significant improvement in lumbar ROM    Time  6    Period  Weeks    Status  On-going      PT LONG TERM GOAL #4   Title  Pt to demo increased strength of Bil LEs and core to at least 4+/5 to improve stability and ability for return to work.    Baseline  Strength has improved with all movements    Time  6    Period  Weeks    Status  On-going      PT LONG TERM GOAL #5   Title  Pt to demo ability for proper lift, squat technique, for at least 25 lb, to improve ability for work duties.    Baseline  began basic squat training today    Time  6    Period  Weeks    Status  On-going            Plan - 10/28/18 1609    Clinical Impression Statement  Patient continues to make good progress. He is having very little pain with fucntional strengthening. Therapy continues to have him wqork on fliting and pulling for return to work. Therapy increased his bx lift to 10 lbs today. Therapy will advance the patient per MD reccomendations.    Personal Factors and Comorbidities  Profession    Examination-Activity Limitations  Bathing;Locomotion Level;Transfers;Bend;Sit;Sleep;Carry;Squat;Stairs;Dressing;Stand;Lift;Bed Mobility    Examination-Participation Restrictions  Meal Prep;Cleaning;Community Activity;Driving;Laundry    Stability/Clinical Decision Making  Evolving/Moderate complexity    Clinical Decision Making  Moderate    Rehab Potential  Good    PT Frequency  2x / week    PT Duration  6 weeks    PT Treatment/Interventions  ADLs/Self  Care Home Management;Canalith Repostioning;Cryotherapy;Electrical Stimulation;DME Instruction;Ultrasound;Traction;Moist Heat;Iontophoresis 4mg /ml Dexamethasone;Gait training;Stair training;Functional mobility training;Therapeutic activities;Therapeutic exercise;Balance training;Neuromuscular re-education;Manual techniques;Orthotic  Fit/Training;Patient/family education;Passive range of motion;Dry needling;Energy conservation;Taping;Spinal Manipulations;Joint Manipulations    PT Next Visit Plan  continue with current plan. Adssess tolerance to nrual tension stretch; continue to progress core strengthening    PT Home Exercise Plan  neural tension stretch; pirifromis stretch, PPT    Consulted and Agree with Plan of Care  Patient       Patient will benefit from skilled therapeutic intervention in order to improve the following deficits and impairments:     Visit Diagnosis: Radiculopathy, lumbar region     Problem List Patient Active Problem List   Diagnosis Date Noted  . Gastroesophageal reflux disease without esophagitis 06/19/2017  . Need for hepatitis A vaccination 06/19/2017  . 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 Routine screening for STI (sexually transmitted infection) 06/07/2016  . SVT (supraventricular tachycardia) ([place]) 10/08/2014  . Depression 10/08/2014  . Atypical nevi 06/22/2014  . Elevated triglycerides with high cholesterol 09/06/2013  . Anxiety state 08/03/2013  . Essential hypertension, benign 08/03/2013  . Environmental allergies 03/23/2013  . Visit for preventive health examination 03/23/2013  . Meniere disease 03/23/2013  . Intrinsic asthma     Carney Living PT DPT 10/28/2018, 4:30 PM  [facility] [address] [place], [place], [address] Phone: [phone]   Fax:  [phone]  Name: Steve Jacobs MRN: [id number] Date of Birth: [birth year]

## 2018-11-03 ENCOUNTER — Ambulatory Visit: Payer: PRIVATE HEALTH INSURANCE | Admitting: Physical Therapy

## 2018-11-03 ENCOUNTER — Other Ambulatory Visit: Payer: Self-pay

## 2018-11-03 DIAGNOSIS — M5416 Radiculopathy, lumbar region: Secondary | ICD-10-CM

## 2018-11-04 NOTE — Therapy (Addendum)
[place], [place], [address] Phone: [phone]   Fax:  [phone]  Physical Therapy Treatment/Discharge   Patient Details  Name: Steve Jacobs MRN: [id number] Date of Birth: 1985/12/16 Referring Provider (PT): Dr Phylliss Bob   Encounter Date: 11/03/2018  PT End of Session - 11/03/18 1623    Visit Number  9    Number of Visits  12    Date for PT Re-Evaluation  11/17/18    Authorization Type  Workers comp 3 more visits approved 10/20/2018    PT Start Time  0347    PT Stop Time  0437    PT Time Calculation (min)  50 min    Activity Tolerance  Patient limited by pain    Behavior During Therapy  [facility] for tasks assessed/performed       Past Medical History:  Diagnosis Date  . Asthma   . Chicken pox   . Depression    Resolved  . Environmental and seasonal allergies   . Essential hypertension, benign 08/03/2013  . Hyperlipidemia   . Meniere's disease   . Thoracic outlet syndrome     Past Surgical History:  Procedure Laterality Date  . Nevus Biopsy      There were no vitals filed for this visit.                    [place] Adult PT Treatment/Exercise - 11/04/18 0001      Lumbar Exercises: Stretches   Active Hamstring Stretch Limitations  2x20 sec hold bilateral     Lower Trunk Rotation Limitations  x10 in limited motion     Piriformis Stretch Limitations  glute stretch 3x20 sec hold right       Lumbar Exercises: Aerobic   Nustep  L5 x 5 min   pain pushing with RLE      Lumbar Exercises: Standing   Theraband Level (Row)  Level 3 (Green)    Row Limitations  2x10     Shoulder Extension  20 reps    Theraband Level (Shoulder Extension)  Level 3 (Green)      Lumbar Exercises: Supine   Pelvic Tilt Limitations  x10 with breathing     Bent Knee Raise Limitations  2x10 reported pain inhis tailbone     Bridge Limitations  not able to obtain a full bridge but 2x10     Large Ball  Oblique Isometric Limitations  supine clamshell with green 2x10       Manual Therapy   Manual therapy comments  IASTYM to lumbar spine in sidelying. Reported the most improvement with this.     Joint Mobilization  PA glides Grade II to L2 and L4/L5     Manual Traction  Long axis distraction both legs                PT Short Term Goals - 11/03/18 1645      PT SHORT TERM GOAL #1   Title  Pt to be independent with initial HEP    Baseline  independent with current HEP    Time  2    Period  Weeks    Status  Achieved    Target Date  09/16/18      PT SHORT TERM GOAL #2   Title  Pt to demo decreased pain in back and LE to be 3/10    Baseline  3/10 today    Time  2  PT SHORT TERM GOAL #3   Title  The patient will have improved proprioception to withstand moderate challenges with soft surfaces and change of direction to 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    Time  6    Period  Weeks    Status  On-going      PT LONG TERM GOAL #3   Title  Pt to demo improved Lumbar ROM to be [facility] for pt age.    Baseline  significant improvement in lumbar ROM    Time  6    Period  Weeks    Status  On-going      PT LONG TERM GOAL #4   Title  Pt to demo increased strength of Bil LEs and core to at least 4+/5 to improve stability and ability for return to work.    Baseline  Strength has improved with all movements    Time  6    Period  Weeks    Status  On-going      PT LONG TERM GOAL #5   Title  Pt to demo ability for proper lift, squat technique, for at least 25 lb, to improve ability for work duties.    Baseline  began  basic squat training today    Time  6    Period  Weeks    Status  On-going            Plan - 11/03/18 1643    Clinical Impression Statement  Patient was a limited by pain today but was still able to perfrom ther-ex. He had spasming around his L5-S1 area. He was able tot tolerate table and standing exercises without much increase in pain. We will hpefully be able to continue to progress patient back into lift training next visit. Patient used heat today.    Examination-Activity Limitations  Bathing;Locomotion Level;Transfers;Bend;Sit;Sleep;Carry;Squat;Stairs;Dressing;Stand;Lift;Bed Mobility    Examination-Participation Restrictions  Meal Prep;Cleaning;Community Activity;Driving;Laundry    Stability/Clinical Decision Making  Evolving/Moderate complexity    Clinical Decision Making  Moderate    Rehab Potential  Good    PT Frequency  2x / week    PT Duration  6 weeks    PT Treatment/Interventions  ADLs/Self Care Home Management;Canalith Repostioning;Cryotherapy;Electrical Stimulation;DME Instruction;Ultrasound;Traction;Moist Heat;Iontophoresis 42m/ml Dexamethasone;Gait training;Stair training;Functional mobility training;Therapeutic activities;Therapeutic exercise;Balance training;Neuromuscular re-education;Manual techniques;Orthotic Fit/Training;Patient/family education;Passive range of motion;Dry needling;Energy conservation;Taping;Spinal Manipulations;Joint Manipulations    PT Next Visit Plan  continue with current plan. Adssess tolerance to nrual tension stretch; continue to progress core strengthening    PT Home Exercise Plan  neural tension stretch; pirifromis stretch, PPT    Consulted and Agree with Plan of Care  Patient       Patient will benefit from skilled therapeutic intervention in order to improve the following deficits and impairments:  Abnormal gait, Decreased range of motion, Difficulty walking, Increased muscle spasms, Decreased endurance, Decreased activity  tolerance, Pain,  Improper body mechanics, Impaired flexibility, Hypomobility, Decreased mobility, Decreased strength  Visit Diagnosis: Radiculopathy, lumbar region - Plan: PT plan of care cert/re-cert     Problem List Patient Active Problem List   Diagnosis Date Noted  . Gastroesophageal reflux disease without esophagitis 06/19/2017  . Need for hepatitis A vaccination 06/19/2017  . 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 Routine screening for STI (sexually transmitted infection) 06/07/2016  . SVT (supraventricular tachycardia) ([place]) 10/08/2014  . Depression 10/08/2014  . Atypical nevi 06/22/2014  . Elevated triglycerides with high cholesterol 09/06/2013  . Anxiety state 08/03/2013  . Essential hypertension, benign 08/03/2013  . Environmental allergies 03/23/2013  . Visit for preventive health examination 03/23/2013  . Meniere disease 03/23/2013  . Intrinsic asthma     PHYSICAL THERAPY DISCHARGE SUMMARY  Visits from Start of Care: 9  Current functional level related to goals / functional outcomes: Significant improvement in pain    Remaining deficits: Pain at times with bending    Education / Equipment: HEP   Plan: Patient agrees to discharge.  Patient goals were partially met. Patient is being discharged due to meeting the stated rehab goals.  ?????      Carney Living PT DPT 11/04/2018, 1:28 PM  [facility] [address] [place], [place], [address] Phone: [phone]   Fax:  [phone]  Name: CRIXUS MCAULAY MRN: [id number] Date of Birth: Nov 25, 1985

## 2018-11-06 ENCOUNTER — Ambulatory Visit: Payer: PRIVATE HEALTH INSURANCE | Admitting: Physical Therapy

## 2018-11-09 ENCOUNTER — Encounter: Payer: Self-pay | Admitting: Physician Assistant

## 2018-11-10 ENCOUNTER — Ambulatory Visit: Payer: PRIVATE HEALTH INSURANCE | Admitting: Physical Therapy

## 2018-11-11 ENCOUNTER — Ambulatory Visit (INDEPENDENT_AMBULATORY_CARE_PROVIDER_SITE_OTHER): Payer: PRIVATE HEALTH INSURANCE | Admitting: Physician Assistant

## 2018-11-11 ENCOUNTER — Encounter: Payer: Self-pay | Admitting: Physician Assistant

## 2018-11-11 ENCOUNTER — Other Ambulatory Visit: Payer: Self-pay

## 2018-11-11 VITALS — BP 160/100 | HR 101 | Temp 99.7°F | Resp 16 | Ht 68.0 in | Wt 234.0 lb

## 2018-11-11 DIAGNOSIS — F411 Generalized anxiety disorder: Secondary | ICD-10-CM

## 2018-11-11 DIAGNOSIS — Z23 Encounter for immunization: Secondary | ICD-10-CM | POA: Diagnosis not present

## 2018-11-11 DIAGNOSIS — F329 Major depressive disorder, single episode, unspecified: Secondary | ICD-10-CM | POA: Diagnosis not present

## 2018-11-11 DIAGNOSIS — I471 Supraventricular tachycardia: Secondary | ICD-10-CM

## 2018-11-11 DIAGNOSIS — F32A Depression, unspecified: Secondary | ICD-10-CM

## 2018-11-11 MED ORDER — METOPROLOL SUCCINATE ER 50 MG PO TB24: 50.0000 mg | ORAL_TABLET | Freq: Every day | ORAL | 1 refills | Status: DC

## 2018-11-11 MED ORDER — DIAZEPAM 10 MG PO TABS: 5.0000 mg | ORAL_TABLET | Freq: Two times a day (BID) | ORAL | 0 refills | Status: DC | PRN

## 2018-11-11 MED ORDER — ESCITALOPRAM OXALATE 20 MG PO TABS: 20.0000 mg | ORAL_TABLET | Freq: Every day | ORAL | 1 refills | Status: DC

## 2018-11-11 MED FILL — METOPROLOL SUCCINATE ER 50: 50 | 30 days supply | Qty: 30 | Fill #0

## 2018-11-11 MED FILL — ESCITALOPRAM 20 MG TABLET: 20 | 30 days supply | Qty: 30 | Fill #0

## 2018-11-11 NOTE — Patient Instructions (Signed)
Please keep hydrated and try to rest. I have increased the Lexapro to 20 mg and the Metoprolol XL to 50 mg -- ok to take 2 of your current tablets until you run out. I sent in a new dose of the Valium for you.  Watch salt intake and continue deep breathing exercises. Follow-up with me in 2 weeks for reassessment.

## 2018-11-11 NOTE — Progress Notes (Signed)
Patient presents to clinic today c/o significant increase in stress, anxiety and anhedonia over the past few weeks due to some very significant, impactful events. Patient endorses losing his uncle 3 weeks ago, after his uncle committed suicide to escape pain from metastatic cancer. Shortly after this he injured his back at work causing some significant sciatica. Has been seeing a specialist for this and is recovering. Also a couple of weeks ago his grandmother starting having issue with bloody emesis and melena. Was seen in ER for assessment and found to have cancer that had metastasized. She died within a week of diagnosis. States he had been helping his aunt take care of her until she passed. 3 days ago, his mother was found dead in her home. Cause of death is still unclear. As such patient is having a very hard time. States he has to be a rock for his younger brother (age 11) and having a hard time staying strong. Feels like he is able to grieve appropriately, taking it " an hour at a time". The anxiety he is having from all of these sudden changes and having to take care of his grandmother and mother's estates, while taking care of his younger brother is having a significant toll on him. Notes increased heart rate, anxiety and jitteriness (history of SVT) despite taking his Lexapro, Metoprolol and Valium. Sleep is some times disrupted but overall ok. Appetite is down. Denies nausea or vomiting. Denies SI/HI.  Past Medical History:  Diagnosis Date  . Asthma   . Chicken pox   . Depression    Resolved  . Environmental and seasonal allergies   . Essential hypertension, benign 08/03/2013  . Hyperlipidemia   . Meniere's disease   . Thoracic outlet syndrome     Current Outpatient Medications on File Prior to Visit  Medication Sig Dispense Refill  . acetaminophen (TYLENOL 8 HOUR) 650 MG CR tablet Take 1 tablet (650 mg total) by mouth every 8 (eight) hours as needed for pain or fever. 30 tablet 0  .  albuterol (VENTOLIN HFA) 108 (90 Base) MCG/ACT inhaler INHALE 2 PUFFS BY MOUTH EVERY 6 HOURS AS NEEDED FOR WHEEZING 54 g 0  . atorvastatin (LIPITOR) 20 MG tablet TAKE 1 TABLET (20 MG TOTAL) BY MOUTH DAILY. 90 tablet 0  . budesonide-formoterol (SYMBICORT) 80-4.5 MCG/ACT inhaler Inhale 2 puffs into the lungs 2 (two) times daily. 1 Inhaler 6  . cyclobenzaprine (FLEXERIL) 10 MG tablet Take 1 tablet (10 mg total) by mouth 2 (two) times daily as needed for muscle spasms. 20 tablet 0  . diazepam (VALIUM) 5 MG tablet TAKE 1 TABLET BY MOUTH TWICE DAILY 60 tablet 0  . diphenhydrAMINE (BENADRYL) 25 MG tablet Take 25 mg by mouth every 6 (six) hours as needed for itching or allergies.    Marland Kitchen escitalopram (LEXAPRO) 10 MG tablet TAKE 1 TABLET (10 MG TOTAL) BY MOUTH DAILY. 90 tablet 0  . fenofibrate (TRICOR) 145 MG tablet TAKE 1 TABLET (145 MG TOTAL) BY MOUTH DAILY. 90 tablet 0  . finasteride (PROPECIA) 1 MG tablet TAKE 1 TABLET (1 MG TOTAL) BY MOUTH DAILY. 90 tablet 0  . fluticasone (FLONASE) 50 MCG/ACT nasal spray Place 2 sprays into both nostrils daily. 16 g 3  . ibuprofen (ADVIL) 600 MG tablet Take 1 tablet (600 mg total) by mouth every 6 (six) hours as needed. 30 tablet 0  . levocetirizine (XYZAL) 5 MG tablet TAKE 1 TABLET (5 MG TOTAL) BY MOUTH EVERY EVENING. 90 tablet  0  . metoprolol succinate (TOPROL-XL) 25 MG 24 hr tablet Take 1 tablet (25 mg total) by mouth daily. 90 tablet 0  . montelukast (SINGULAIR) 10 MG tablet Take 1 tablet (10 mg total) by mouth at bedtime. 90 tablet 1  . Multiple Vitamin (MULTIVITAMIN WITH MINERALS) TABS tablet Take 1 tablet by mouth daily.    Marland Kitchen omeprazole (PRILOSEC) 20 MG capsule TAKE 1 CAPSULE (20 MG TOTAL) BY MOUTH DAILY. 90 capsule 1  . promethazine (PHENERGAN) 25 MG tablet TAKE 1 TABLET BY MOUTH EVERY 8 HOURS AS NEEDED FOR NAUSEA OR VOMITING 20 tablet 0  . temazepam (RESTORIL) 30 MG capsule TAKE 1 CAPSULE (30 MG TOTAL) BY MOUTH AT BEDTIME AS NEEDED FOR SLEEP. 30 capsule 0  .  thiamine (VITAMIN B-1) 100 MG tablet Take 100 mg by mouth daily.    . traMADol (ULTRAM) 50 MG tablet Take 1 tablet by mouth 2 (two) times daily.    Marland Kitchen triamterene-hydrochlorothiazide (DYAZIDE) 37.5-25 MG capsule TAKE 1 EACH (1 CAPSULE TOTAL) BY MOUTH DAILY. 90 capsule 0  . VASCEPA 1 g CAPS TAKE 2 CAPSULES BY MOUTH TWICE A DAY 360 capsule 1   No current facility-administered medications on file prior to visit.     Allergies  Allergen Reactions  . Augmentin [Amoxicillin-Pot Clavulanate]   . Belsomra [Suvorexant] Other (See Comments)    nightmares  . Percocet [Oxycodone-Acetaminophen] Nausea And Vomiting    Family History  Problem Relation Age of Onset  . Hyperlipidemia Father        Living  . Stroke Father   . Hypertension Father   . Alcohol abuse Mother        Living  . Diabetes Mellitus II Maternal Grandfather   . Hypertension Maternal Grandfather   . Heart failure Maternal Grandfather   . Kidney disease Maternal Grandfather   . Heart disease Maternal Grandmother   . Melanoma Paternal Grandmother   . Heart attack Paternal Grandfather   . Migraines Brother   . Healthy Brother        x2  . Drug abuse Sister        Died of Overdose    Social History   Socioeconomic History  . Marital status: Single    Spouse name: Not on file  . Number of children: Not on file  . Years of education: Not on file  . Highest education level: Not on file  Occupational History  . Not on file  Social Needs  . Financial resource strain: Not on file  . Food insecurity    Worry: Not on file    Inability: Not on file  . Transportation needs    Medical: Not on file    Non-medical: Not on file  Tobacco Use  . Smoking status: Never Smoker  . Smokeless tobacco: Never Used  Substance and Sexual Activity  . Alcohol use: Yes    Alcohol/week: 0.0 standard drinks    Comment: 2-3 beers nightly  . Drug use: No  . Sexual activity: Yes    Birth control/protection: None    Comment: male - 1  partner  Lifestyle  . Physical activity    Days per week: Not on file    Minutes per session: Not on file  . Stress: Not on file  Relationships  . Social Herbalist on phone: Not on file    Gets together: Not on file    Attends religious service: Not on file    Active member of  club or organization: Not on file    Attends meetings of clubs or organizations: Not on file    Relationship status: Not on file  Other Topics Concern  . Not on file  Social History Narrative  . Not on file   Review of Systems - See HPI.  All other ROS are negative.  BP (!) 160/100   Pulse (!) 101   Temp 99.7 F (37.6 C) (Temporal)   Resp 16   Ht 5\' 8"  (1.727 m)   Wt 234 lb (106.1 kg)   SpO2 99%   BMI 35.58 kg/m   Physical Exam Vitals signs reviewed.  Constitutional:      Appearance: Normal appearance.  HENT:     Head: Normocephalic and atraumatic.  Neck:     Musculoskeletal: Neck supple.  Cardiovascular:     Rate and Rhythm: Normal rate and regular rhythm.     Pulses: Normal pulses.     Heart sounds: Normal heart sounds.  Pulmonary:     Effort: Pulmonary effort is normal.  Neurological:     Mental Status: He is alert.  Psychiatric:        Attention and Perception: Attention and perception normal.        Mood and Affect: Mood is anxious and depressed. Affect is tearful.        Speech: Speech normal.        Behavior: Behavior normal.        Cognition and Memory: Cognition normal.        Judgment: Judgment normal.    Assessment/Plan: 1. SVT (supraventricular tachycardia) ([facility]) Increase in HR secondary to persistent stress/anxiety. Initially at 101 in the office but by time of EKG after our discussion, had come down to 68 bpm. EKG with NSR. BP hypertensive today. Continue Dyazide at current dose. Will increase Toprol XL to 50 mg QD. Hopefully this will help with HR, anxiety and BP. Close follow-up scheduled. His roommate is an EMT and he will have him check BP daily to record.  Strict ER precautions reviewed.  - EKG 12-Lead - metoprolol succinate (TOPROL-XL) 50 MG 24 hr tablet; Take 1 tablet (50 mg total) by mouth daily.  Dispense: 30 tablet; Refill: 1  2. Anxiety state 3. Depression, unspecified depression type Deteriorated. Increase Metoprolol to 50 mg QD. Increase Lexapro to 20 mg BID. Diazepam as directed if needed. Continue with grief counseling. Close follow-up scheduled.  - EKG 12-Lead - escitalopram (LEXAPRO) 20 MG tablet; Take 1 tablet (20 mg total) by mouth daily.  Dispense: 30 tablet; Refill: 1 - metoprolol succinate (TOPROL-XL) 50 MG 24 hr tablet; Take 1 tablet (50 mg total) by mouth daily.  Dispense: 30 tablet; Refill: 1   4. Need for immunization against influenza - Flu Vaccine QUAD 36+ mos IM   Leeanne Rio, PA-C

## 2018-11-12 ENCOUNTER — Ambulatory Visit: Payer: PRIVATE HEALTH INSURANCE | Admitting: Physical Therapy

## 2018-11-16 MED FILL — CYCLOBENZAPRINE 5 MG TABLET: 5 | 30 days supply | Qty: 60 | Fill #1

## 2018-11-21 ENCOUNTER — Encounter: Payer: Self-pay | Admitting: Physician Assistant

## 2018-11-23 ENCOUNTER — Encounter: Payer: Self-pay | Admitting: Physician Assistant

## 2018-11-23 ENCOUNTER — Other Ambulatory Visit: Payer: Self-pay | Admitting: Physician Assistant

## 2018-11-23 MED ORDER — LORAZEPAM 2 MG PO TABS: 2.0000 mg | ORAL_TABLET | Freq: Three times a day (TID) | ORAL | 0 refills | Status: DC | PRN

## 2018-11-23 MED ORDER — ZOLPIDEM TARTRATE 5 MG PO TABS: 5.0000 mg | ORAL_TABLET | Freq: Every evening | ORAL | 1 refills | Status: DC | PRN

## 2018-11-23 MED FILL — ZOLPIDEM TARTRATE 5 MG TAB: 5 | 15 days supply | Qty: 15 | Fill #0

## 2018-11-23 MED FILL — LORazepam 2 MG TABS: 2 | 10 days supply | Qty: 30 | Fill #0

## 2018-11-24 ENCOUNTER — Encounter: Payer: Self-pay | Admitting: Physician Assistant

## 2018-11-27 ENCOUNTER — Encounter: Payer: Self-pay | Admitting: Physician Assistant

## 2018-11-30 MED FILL — OMEPRAZOLE 20 MG CAPSULE DR: 20 | 90 days supply | Qty: 90 | Fill #1

## 2018-12-04 MED FILL — traMADol HCL 50 MG TABS: 50 | 20 days supply | Qty: 60 | Fill #0

## 2018-12-05 ENCOUNTER — Encounter: Payer: Self-pay | Admitting: Physician Assistant

## 2018-12-07 MED ORDER — LORAZEPAM 2 MG PO TABS: 2.0000 mg | ORAL_TABLET | Freq: Three times a day (TID) | ORAL | 1 refills | Status: DC | PRN

## 2018-12-07 MED ORDER — ZOLPIDEM TARTRATE 5 MG PO TABS: 5.0000 mg | ORAL_TABLET | Freq: Every evening | ORAL | 1 refills | Status: DC | PRN

## 2018-12-12 ENCOUNTER — Other Ambulatory Visit: Payer: Self-pay | Admitting: Physician Assistant

## 2018-12-14 MED FILL — TRIAMTERENE/HCTZ 37.5/25 CP: 37.5-25 | 90 days supply | Qty: 90 | Fill #0

## 2018-12-27 ENCOUNTER — Encounter: Payer: Self-pay | Admitting: Physician Assistant

## 2018-12-28 MED FILL — ZOLPIDEM TARTRATE 5 MG TAB: 5 | 15 days supply | Qty: 15 | Fill #1

## 2018-12-28 MED FILL — METOPROLOL SUCCINATE ER 50: 50 | 30 days supply | Qty: 30 | Fill #1

## 2018-12-28 MED FILL — ESCITALOPRAM 20 MG TABLET: 20 | 30 days supply | Qty: 30 | Fill #1

## 2018-12-28 MED FILL — LORazepam 2 MG TABS: 2 | 10 days supply | Qty: 30 | Fill #1

## 2019-01-04 ENCOUNTER — Other Ambulatory Visit: Payer: Self-pay | Admitting: Physician Assistant

## 2019-01-04 MED FILL — ATORVASTATIN 20 MG TABLET: 20 | 90 days supply | Qty: 90 | Fill #0

## 2019-01-05 MED ORDER — ALBUTEROL SULFATE HFA 108 (90 BASE) MCG/ACT IN AERS: INHALATION_SPRAY | RESPIRATORY_TRACT | 1 refills | Status: DC

## 2019-01-05 MED FILL — ALBUTEROL SULFATE HFA 108 (: 108 (90 BAS | 75 days supply | Qty: 54 | Fill #0

## 2019-01-06 MED FILL — FINASTERIDE 1 MG TABLET: 1 | 90 days supply | Qty: 90 | Fill #0

## 2019-01-06 MED FILL — traMADol HCL 50 MG TABS: 50 | 20 days supply | Qty: 60 | Fill #0

## 2019-01-06 MED FILL — CYCLOBENZAPRINE 5 MG TABLET: 5 | 30 days supply | Qty: 60 | Fill #0

## 2019-01-06 MED FILL — FENOFIBRATE 145 MG TABS: 145 | 90 days supply | Qty: 90 | Fill #0

## 2019-01-12 ENCOUNTER — Other Ambulatory Visit (HOSPITAL_COMMUNITY): Payer: Self-pay | Admitting: Orthopedic Surgery

## 2019-01-12 ENCOUNTER — Other Ambulatory Visit: Payer: Self-pay | Admitting: Orthopedic Surgery

## 2019-01-12 DIAGNOSIS — M545 Low back pain, unspecified: Secondary | ICD-10-CM

## 2019-01-12 MED FILL — HYDROCODON-APAP 5-325: 5-325 | 7 days supply | Qty: 50 | Fill #0

## 2019-01-14 ENCOUNTER — Other Ambulatory Visit: Payer: Self-pay | Admitting: Physician Assistant

## 2019-01-14 MED FILL — ZOLPIDEM TARTRATE 5 MG TAB: 5 | 15 days supply | Qty: 15 | Fill #1

## 2019-01-14 NOTE — Telephone Encounter (Signed)
Lorazepam last rx 12/07/18 #30 1 RF LOV: 11/11/18 CSC: 01/15/17

## 2019-01-17 ENCOUNTER — Encounter: Payer: Self-pay | Admitting: Physician Assistant

## 2019-01-18 ENCOUNTER — Ambulatory Visit (HOSPITAL_COMMUNITY)
Admission: RE | Admit: 2019-01-18 | Discharge: 2019-01-18 | Disposition: A | Discharge status: Home / Self Care | Payer: PRIVATE HEALTH INSURANCE | Source: Ambulatory Visit | Attending: Orthopedic Surgery | Admitting: Orthopedic Surgery

## 2019-01-18 ENCOUNTER — Other Ambulatory Visit: Payer: Self-pay

## 2019-01-18 DIAGNOSIS — M545 Low back pain, unspecified: Secondary | ICD-10-CM

## 2019-01-22 MED FILL — HYDROCODON-APAP 5-325: 5-325 | 15 days supply | Qty: 45 | Fill #0

## 2019-01-26 ENCOUNTER — Other Ambulatory Visit: Payer: Self-pay | Admitting: Physician Assistant

## 2019-01-26 DIAGNOSIS — F411 Generalized anxiety disorder: Secondary | ICD-10-CM

## 2019-01-26 DIAGNOSIS — I471 Supraventricular tachycardia: Secondary | ICD-10-CM

## 2019-01-26 MED FILL — LORazepam 2 MG TABS: 2 | 10 days supply | Qty: 30 | Fill #0

## 2019-01-26 MED FILL — METOPROLOL SUCCINATE ER 50: 50 | 30 days supply | Qty: 30 | Fill #0

## 2019-01-26 MED FILL — LEVOCETIRIZINE 5 MG TABLET: 5 | 30 days supply | Qty: 30 | Fill #1

## 2019-01-26 NOTE — Telephone Encounter (Signed)
LOV 11/11/2018 Last refill on 12/07/2018 Rx Ambien #15 1rf  Last refill on 12/07/2018 Rx Lorazepam #30 1rf CSC 01/15/17

## 2019-01-29 MED FILL — ZOLPIDEM TARTRATE 5 MG TAB: 5 | 15 days supply | Qty: 15 | Fill #0

## 2019-02-10 MED FILL — PREGABALIN 75 MG CAPS: 75 | 30 days supply | Qty: 60 | Fill #0

## 2019-02-11 MED FILL — LORazepam 2 MG TABS: 2 | 10 days supply | Qty: 30 | Fill #1

## 2019-02-12 MED FILL — ZOLPIDEM TARTRATE 5 MG TAB: 5 | 15 days supply | Qty: 15 | Fill #1

## 2019-02-20 ENCOUNTER — Encounter: Payer: Self-pay | Admitting: Physician Assistant

## 2019-03-01 ENCOUNTER — Other Ambulatory Visit: Payer: Self-pay | Admitting: Physician Assistant

## 2019-03-01 MED FILL — ZOLPIDEM TARTRATE 5 MG TAB: 5 | 15 days supply | Qty: 15 | Fill #0

## 2019-03-01 MED FILL — MONTELUKAST SOD 10 MG TAB: 10 | 90 days supply | Qty: 90 | Fill #0

## 2019-03-01 MED FILL — LEVOCETIRIZINE 5 MG TABLET: 5 | 30 days supply | Qty: 30 | Fill #2

## 2019-03-01 MED FILL — LORazepam 2 MG TABS: 2 | 10 days supply | Qty: 30 | Fill #0

## 2019-03-01 NOTE — Telephone Encounter (Signed)
Lorazepam last rx  #30 1 RF Ambie/n last rx # 15 1 RF LOV: Anxiety, Depression, SVT CSC: 01/15/17

## 2019-03-07 ENCOUNTER — Encounter: Payer: Self-pay | Admitting: Physician Assistant

## 2019-03-08 ENCOUNTER — Other Ambulatory Visit: Payer: Self-pay | Admitting: Physician Assistant

## 2019-03-08 DIAGNOSIS — F32A Depression, unspecified: Secondary | ICD-10-CM

## 2019-03-08 DIAGNOSIS — F411 Generalized anxiety disorder: Secondary | ICD-10-CM

## 2019-03-08 DIAGNOSIS — F329 Major depressive disorder, single episode, unspecified: Secondary | ICD-10-CM

## 2019-03-08 MED FILL — METOPROLOL SUCCINATE ER 50: 50 | 30 days supply | Qty: 30 | Fill #1

## 2019-03-08 MED FILL — ESCITALOPRAM 20 MG TABLET: 20 | 90 days supply | Qty: 90 | Fill #0

## 2019-03-13 ENCOUNTER — Other Ambulatory Visit: Payer: Self-pay | Admitting: Physician Assistant

## 2019-03-15 MED FILL — LORazepam 2 MG TABS: 2 | 10 days supply | Qty: 30 | Fill #1

## 2019-03-15 MED FILL — PROMETHAZINE 25 MG TABLET: 25 | 7 days supply | Qty: 20 | Fill #0

## 2019-03-16 MED FILL — PREGABALIN 75 MG CAPS: 75 | 30 days supply | Qty: 60 | Fill #0

## 2019-03-18 MED FILL — ZOLPIDEM TARTRATE 5 MG TAB: 5 | 15 days supply | Qty: 15 | Fill #1

## 2019-03-25 ENCOUNTER — Telehealth: Payer: Self-pay | Admitting: Physician Assistant

## 2019-03-25 NOTE — Telephone Encounter (Signed)
I have placed a surgery clearance form in the bin upfront with a charge sheet.  

## 2019-03-25 NOTE — Telephone Encounter (Signed)
Patient has scheduled appointment for pre-op clearance. Will complete at that time.

## 2019-03-26 MED FILL — ZOLPIDEM TARTRATE 5 MG TAB: 5 | 15 days supply | Qty: 15 | Fill #1

## 2019-03-30 MED FILL — PREGABALIN 150 MG CAPS: 150 | 30 days supply | Qty: 60 | Fill #0

## 2019-03-30 MED FILL — HYDROCODON-APAP 5-325: 5-325 | 15 days supply | Qty: 45 | Fill #0

## 2019-04-01 ENCOUNTER — Other Ambulatory Visit: Payer: Self-pay | Admitting: Physician Assistant

## 2019-04-01 MED FILL — LEVOCETIRIZINE 5 MG TABLET: 5 | 30 days supply | Qty: 30 | Fill #0

## 2019-04-01 MED FILL — TRIAMTERENE/HCTZ 37.5/25 CP: 37.5-25 | 90 days supply | Qty: 90 | Fill #1

## 2019-04-07 ENCOUNTER — Telehealth: Payer: Self-pay | Admitting: Emergency Medicine

## 2019-04-07 NOTE — Telephone Encounter (Signed)
Patient called in with questions about his surgical clearance paperwork. This is a workers comp injury. Patient is due for his CPE in May.  He is having back surgery on April 22, 2019 by Dr Matters at [facility]. For his surgical clearance paperwork. He was wanting to know if he needs to come in for an appointment to update things. Last EKG-11/11/18, Last labs-06/11/18 He states he is having surgical clearance by the anaesthesia and the surgeon.  Patient will have to contact his worker comp people to get approval. Patient did say he would like to get an appointment prior to his surgery for peace of mind Please advise

## 2019-04-07 NOTE — Telephone Encounter (Signed)
If I am to sign off surgical clearance then I would need to see him. See no need for repeat ekg giving young age and recent one on file. So either an appointment for preop clearance is needed for exam and labs or he would need his workers comp provider to complete clearance

## 2019-04-09 NOTE — Telephone Encounter (Signed)
Advised patient to contact worker comp people to get the appointment approved then we will schedule an appointment to complete the paperwork. Patient is agreeable.

## 2019-04-09 NOTE — Telephone Encounter (Signed)
Advised patient that for pre-op clearance paperwork. He has pre-op with the general surgeon, ortho surgeon and anesthesia. So he has to get approval from worker's comp for an appointment.  Patient states signing the paperwork is easier. He will have his first pre-op clearance on 04/14/19.  Unsure of what is needed to completed on the paperwork.  He is ok to with an appointment but has to be approved with workers comp first.

## 2019-04-12 ENCOUNTER — Encounter: Payer: Self-pay | Admitting: Physician Assistant

## 2019-04-12 ENCOUNTER — Other Ambulatory Visit: Payer: Self-pay | Admitting: Physician Assistant

## 2019-04-12 DIAGNOSIS — I471 Supraventricular tachycardia: Secondary | ICD-10-CM

## 2019-04-12 DIAGNOSIS — F411 Generalized anxiety disorder: Secondary | ICD-10-CM

## 2019-04-12 MED FILL — METOPROLOL SUCCINATE ER 50: 50 | 90 days supply | Qty: 90 | Fill #0

## 2019-04-12 MED FILL — ZOLPIDEM TARTRATE 5 MG TAB: 5 | 15 days supply | Qty: 15 | Fill #0

## 2019-04-12 MED FILL — LORazepam 2 MG TABS: 2 | 10 days supply | Qty: 30 | Fill #0

## 2019-04-12 NOTE — Telephone Encounter (Signed)
Lorazepam last rx 03/01/19 #30 1 RF Ambien last rx 03/01/19 #15 1 RF LOV: 11/11/18

## 2019-04-14 MED FILL — HYDROCODON-APAP 5-325: 5-325 | 5 days supply | Qty: 15 | Fill #0

## 2019-04-14 MED FILL — METHOCARBAMOL 750 MG TABS: 750 | 13 days supply | Qty: 40 | Fill #0

## 2019-04-16 ENCOUNTER — Other Ambulatory Visit: Payer: Self-pay | Admitting: Physician Assistant

## 2019-04-19 MED FILL — OMEPRAZOLE DR 20 MG CAPSULE: 20 | 90 days supply | Qty: 90 | Fill #0

## 2019-04-22 HISTORY — PX: LUMBAR FUSION: SHX111

## 2019-04-26 MED FILL — OXYCODONE-ACETAMINOPHEN 5-3: 5-325 | 5 days supply | Qty: 42 | Fill #0

## 2019-04-26 MED FILL — CYCLOBENZAPRINE HCL 10 MG T: 10 | 14 days supply | Qty: 42 | Fill #0

## 2019-04-29 MED FILL — LORazepam 2 MG TABS: 2 | 10 days supply | Qty: 30 | Fill #1

## 2019-04-30 MED FILL — OXYCODONE-ACETAMINOPHEN 5-3: 5-325 | 7 days supply | Qty: 42 | Fill #0

## 2019-05-06 ENCOUNTER — Ambulatory Visit: Payer: PRIVATE HEALTH INSURANCE | Attending: Orthopedic Surgery | Admitting: Physical Therapy

## 2019-05-06 ENCOUNTER — Encounter: Payer: Self-pay | Admitting: Physical Therapy

## 2019-05-06 ENCOUNTER — Other Ambulatory Visit: Payer: Self-pay

## 2019-05-06 DIAGNOSIS — M6281 Muscle weakness (generalized): Secondary | ICD-10-CM | POA: Diagnosis present

## 2019-05-06 DIAGNOSIS — M545 Low back pain: Secondary | ICD-10-CM | POA: Insufficient documentation

## 2019-05-06 DIAGNOSIS — R2689 Other abnormalities of gait and mobility: Secondary | ICD-10-CM | POA: Insufficient documentation

## 2019-05-06 DIAGNOSIS — G8929 Other chronic pain: Secondary | ICD-10-CM | POA: Insufficient documentation

## 2019-05-06 NOTE — Therapy (Signed)
[place], [place], [address] Phone: [phone]   Fax:  [phone]  Physical Therapy Evaluation  Patient Details  Name: Steve Jacobs MRN: [id number] Date of Birth: May 23, 1985 Referring Provider (PT): Odelia Gage [facility]    Encounter Date: 05/06/2019  PT End of Session - 05/06/19 1040    Visit Number  1    Number of Visits  13    Date for PT Re-Evaluation  06/10/19    PT Start Time  1106    PT Stop Time  1145    PT Time Calculation (min)  39 min    Activity Tolerance  Patient tolerated treatment well    Behavior During Therapy  [facility] for tasks assessed/performed       Past Medical History:  Diagnosis Date  . Asthma   . Chicken pox   . Depression    Resolved  . Environmental and seasonal allergies   . Essential hypertension, benign 08/03/2013  . Hyperlipidemia   . Meniere's disease   . Thoracic outlet syndrome     Past Surgical History:  Procedure Laterality Date  . Nevus Biopsy      There were no vitals filed for this visit.   Subjective Assessment - 05/06/19 1030    Subjective  Patient had a history of low back pain. He was seen in therapy lasat in October of 2020. He still had some residual pain but he went back to work light duty. In November he began having significant pain in his lower back. His left leg began going numb in Novemebr . 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    Limitations  Standing;Walking    How long can you stand comfortably?  As the day goes on he has more difficulty standing    How long can you walk comfortably?  limited didstances    Diagnostic tests  Psot-op solid fusion    Patient Stated Goals  to go back to work    Currently in Pain?  Yes   No pain initially but with activity pain increased   Pain Score  5     Pain Location  Back    Pain Orientation  Right;Left    Pain Descriptors / Indicators   Aching    Pain Type  Surgical pain    Pain Radiating Towards  no radiating pain at this time    Pain Onset  More than a month ago    Pain Frequency  Constant    Aggravating Factors   Standing/ walking/ daily activity    Pain Relieving Factors  rest         [facility] PT Assessment - 05/06/19 0001      Assessment   Medical Diagnosis  Lumbar fusion/ lamiectomy     Referring Provider (PT)  Mark Rescino PAC     Onset Date/Surgical Date  04/22/19    Next MD Visit  4 weeks     Prior Therapy  prior to surgery for lower back       Precautions   Precautions  Back    Precaution Comments  per patient 20 lb lifting. Nothing on script       Restrictions   Weight Bearing Restrictions  No      Balance Screen   Has the patient fallen in the past 6 months  No    Has the patient  had a decrease in activity level because of a fear of falling?   No    Is the patient reluctant to leave their home because of a fear of falling?   No      Home Film/video editor residence      Prior Function   Level of Independence  Independent    Vocation  Full time employment    Vocation Requirements  works as an Training and development officer   Overall Cognitive Status  Within Functional Limits for tasks assessed    Attention  Focused    Focused Attention  Appears intact    Memory  Appears intact    Awareness  Appears intact    Problem Solving  Appears intact      Observation/Other Assessments   Skin Integrity  well healing surgical wounds; sutures still present     Focus on Therapeutic Outcomes (FOTO)   not taken 2nd to time       Sensation   Light Touch  Appears Intact    Additional Comments  weakness but no pain and numbness into bilateral LE       Coordination   Gross Motor Movements are Fluid and Coordinated  Yes    Fine Motor Movements are Fluid and Coordinated  Yes      ROM / Strength   AROM / PROM / Strength  AROM;PROM;Strength      AROM   Overall AROM Comments  lumbar ROM not  assesed 2nd to recent surgry and brace       PROM   Overall PROM Comments  hip PROM WNL to 90 degrees      Strength   Strength Assessment Site  Hip;Knee    Right/Left Hip  Left;Right    Right Hip Flexion  4/5    Right Hip ABduction  4/5    Right Hip ADduction  4/5    Left Hip Flexion  3+/5    Left Hip ABduction  3+/5    Left Hip ADduction  4/5      Palpation   Palpation comment  not palpated 2nd to recent surgery       Transfers   Comments  slow transfer with log roll from supine to sit; slow sit to stand transfer using hands      Ambulation/Gait   Gait Comments  decreased hip rotation; leaning on walker; decreased ilateral hip flexion                 Objective measurements completed on examination: See above findings.      [facility] Adult PT Treatment/Exercise - 05/06/19 0001      Lumbar Exercises: Seated   Other Seated Lumbar Exercises  laq x10; laq x10 hamstring curl all with good posture and yellow band              PT Education - 05/06/19 1635    Education Details  HEP and symptom mangement    Person(s) Educated  Patient    Methods  Explanation;Demonstration;Tactile cues;Verbal cues    Comprehension  Returned demonstration;Verbalized understanding;Verbal cues required;Tactile cues required       PT Short Term Goals - 05/06/19 1651      PT SHORT TERM GOAL #1   Title  Patient ewill perfrom log roll without hesitancy and pain    Time  3    Period  Weeks    Status  New    Target Date  05/27/19      PT SHORT TERM GOAL #2   Title  Patient will transfer sit to stand without using hands    Time  3    Period  Weeks    Status  New    Target Date  05/27/19      PT SHORT TERM GOAL #3   Title  Patient will ambualte 77' with [place] with good safety and stability    Time  3    Period  Weeks    Status  New    Target Date  05/27/19        PT Long Term Goals - 05/06/19 1652      PT LONG TERM GOAL #1   Title  Patient will ambualte 1000' with LRAD  with good safety and without increased pain    Time  6    Period  Weeks    Status  New    Target Date  06/17/19      PT LONG TERM GOAL #2   Title  Patient will put his shoes on without pain    Time  6    Period  Weeks    Status  New    Target Date  06/17/19      PT LONG TERM GOAL #3   Title  Patient will stand for 1/2 hour without increased pain    Baseline  can not stand for more then 10 minutes    Time  6    Period  Weeks    Status  Achieved    Target Date  06/17/19             Plan - 05/06/19 1636    Clinical Impression Statement  Patient is a 34 year old male S/P L4 to S1 fusion, L5-S1 laminectomy, partial faciotomy. He presents with bilateral lower extremity weakness L>R; decreaed lumbar mobility but not tested today 2nd to recent surgery. The patient also has decreased endruance with all functional tasks. He feels that by 4:00 at night he is fatigued and in pain and has to sit in his recliner the rest of the night. He has increased pain with increased activity. The patient would benefit from skilled therapy to imporove his ability to perfrom ADL's and IADL's and to improve his ability to go back to work. The patient is currently using a rolling walker. Therapy will progress him off a device as tolerated safely.    Personal Factors and Comorbidities  Comorbidity 1;Comorbidity 2    Comorbidities  long standing low back pain, anxiety, depression    Examination-Activity Limitations  Bed Mobility;Squat;Lift;Carry;Reach Overhead;Sit    Examination-Participation Restrictions  Shop;Community Activity;Cleaning;Laundry    Stability/Clinical Decision Making  Evolving/Moderate complexity   recent surgery effecting transfers and mobility   Clinical Decision Making  Moderate    Rehab Potential  Good    PT Frequency  3x / week    PT Duration  4 weeks    PT Treatment/Interventions  ADLs/Self Care Home Management;Cryotherapy;Electrical Stimulation;Traction;Moist Heat;Functional mobility  training;Therapeutic activities;Therapeutic exercise;Gait training;DME Instruction;Neuromuscular re-education;Patient/family education;Manual techniques;Passive range of motion;Taping    PT Next Visit Plan  we will begin with a general strengthening and endurance program; Consdier light supine exercises next visit : SAQ qaud sets; review eated HEP; consider functional strengthening such as sit to stand exercises; consdier ue exercises and standing exercisesa as tolerated. follow symptoms.take a FOTO    PT Home Exercise Plan  laq, hip abduction; hamstring curl all yellow and all  with good posture    Consulted and Agree with Plan of Care  Patient       Patient will benefit from skilled therapeutic intervention in order to improve the following deficits and impairments:  Abnormal gait, Decreased activity tolerance, Pain, Increased fascial restricitons, Decreased strength, Decreased range of motion, Decreased endurance, Difficulty walking  Visit Diagnosis: Chronic bilateral low back pain without sciatica  Muscle weakness (generalized)  Other abnormalities of gait and mobility     Problem List Patient Active Problem List   Diagnosis Date Noted  . Gastroesophageal reflux disease without esophagitis 06/19/2017  . Need for hepatitis A vaccination 06/19/2017  . 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 Routine screening for STI (sexually transmitted infection) 06/07/2016  . SVT (supraventricular tachycardia) ([place]) 10/08/2014  . Depression 10/08/2014  . Atypical nevi 06/22/2014  . Elevated triglycerides with high cholesterol 09/06/2013  . Anxiety state 08/03/2013  . Essential hypertension, benign 08/03/2013  . Environmental allergies 03/23/2013  . Visit for preventive health examination 03/23/2013  . Meniere disease 03/23/2013  . Intrinsic asthma     Carney Living PT DPT  05/06/2019, 5:04 PM  [facility] [address] [place], [place],  [address] Phone: [phone]   Fax:  [phone]  Name: RICH YAEGER MRN: [id number] Date of Birth: [birth year]

## 2019-05-07 MED FILL — OXYCODONE-ACETAMINOPHEN 5-3: 5-325 | 7 days supply | Qty: 42 | Fill #0

## 2019-05-07 MED FILL — PREGABALIN 150 MG CAPS: 150 | 20 days supply | Qty: 40 | Fill #0

## 2019-05-07 MED FILL — CYCLOBENZAPRINE HCL 10 MG T: 10 | 15 days supply | Qty: 30 | Fill #0

## 2019-05-07 MED FILL — ZOLPIDEM TARTRATE 5 MG TAB: 5 | 15 days supply | Qty: 15 | Fill #1

## 2019-05-11 ENCOUNTER — Other Ambulatory Visit: Payer: Self-pay | Admitting: Physician Assistant

## 2019-05-11 MED FILL — LORazepam 2 MG TABS: 2 | 10 days supply | Qty: 30 | Fill #0

## 2019-05-11 MED FILL — FINASTERIDE 1 MG TABLET: 1 | 90 days supply | Qty: 90 | Fill #0

## 2019-05-11 NOTE — Telephone Encounter (Signed)
Last appt 11/11/18 No future appt sched  Lorazepam last filled 04/12/19 #30 with no refills Finasteride last filled 01/06/19 #90 with no refills

## 2019-05-12 ENCOUNTER — Ambulatory Visit: Payer: PRIVATE HEALTH INSURANCE | Admitting: Physical Therapy

## 2019-05-13 ENCOUNTER — Other Ambulatory Visit: Payer: Self-pay

## 2019-05-13 ENCOUNTER — Ambulatory Visit: Payer: PRIVATE HEALTH INSURANCE | Attending: Physician Assistant | Admitting: Physical Therapy

## 2019-05-13 ENCOUNTER — Encounter: Payer: Self-pay | Admitting: Physical Therapy

## 2019-05-13 DIAGNOSIS — M545 Low back pain: Secondary | ICD-10-CM | POA: Diagnosis present

## 2019-05-13 DIAGNOSIS — G8929 Other chronic pain: Secondary | ICD-10-CM | POA: Insufficient documentation

## 2019-05-13 DIAGNOSIS — R2689 Other abnormalities of gait and mobility: Secondary | ICD-10-CM | POA: Insufficient documentation

## 2019-05-13 DIAGNOSIS — M6281 Muscle weakness (generalized): Secondary | ICD-10-CM | POA: Diagnosis present

## 2019-05-13 DIAGNOSIS — M5416 Radiculopathy, lumbar region: Secondary | ICD-10-CM | POA: Diagnosis present

## 2019-05-13 NOTE — Therapy (Signed)
[place], [place], [address] Phone: [phone]   Fax:  [phone]  Physical Therapy Treatment  Patient Details  Name: Steve Jacobs MRN: [id number] Date of Birth: Mar 02, 1985 Referring Provider (PT): Odelia Gage [facility]    Encounter Date: 05/13/2019  PT End of Session - 05/13/19 1440    Visit Number  2    Number of Visits  13    Date for PT Re-Evaluation  06/10/19    Activity Tolerance  Patient tolerated treatment well    Behavior During Therapy  [facility] for tasks assessed/performed       Past Medical History:  Diagnosis Date  . Asthma   . Chicken pox   . Depression    Resolved  . Environmental and seasonal allergies   . Essential hypertension, benign 08/03/2013  . Hyperlipidemia   . Meniere's disease   . Thoracic outlet syndrome     Past Surgical History:  Procedure Laterality Date  . Nevus Biopsy      There were no vitals filed for this visit.  Subjective Assessment - 05/13/19 1431    Subjective  Patient got sore yesterday but he went out to eat and had to do some walking. he was in pain but it is better now. He comes in today without his walker.    How long can you stand comfortably?  As the day goes on he has more difficulty standing    How long can you walk comfortably?  limited didstances    Diagnostic tests  Psot-op solid fusion    Patient Stated Goals  to go back to work    Currently in Pain?  Yes    Pain Score  2     Pain Location  Back    Pain Orientation  Right    Pain Descriptors / Indicators  Aching    Pain Type  Chronic pain    Pain Frequency  Constant    Aggravating Factors   standing and walking    Pain Relieving Factors  rest                       [facility] Adult PT Treatment/Exercise - 05/13/19 0001      Ambulation/Gait   Gait Comments  Patient walked out using a walker with a slow but steady pace; he is leaning on the walker. Ambualted 100' with the walker  with SBA.       Self-Care   Self-Care  Other Self-Care Comments    Other Self-Care Comments   reviewed use of the brace. the patient wants to take the back part out. he was adbvised to keep it in but talk to his MD. He also talked aboutputting his shoes on. Unless specified he was advised to not to excessive bending for at least 6-8 weeks.       Therapeutic Activites    Therapeutic Activities  Other Therapeutic Activities    Other Therapeutic Activities  Patient had to use the sheet to scoot his own hips to perfrom log roll to get up.       Lumbar Exercises: Supine   AB Set Limitations  reviewed abdominla breathing. x5     Other Supine Lumbar Exercises  quad set 2x10 right left x10 but reported increased pain     Other Supine Lumbar Exercises  SAQ 2x10 right; 5x on the left but then patient began reporting significant pain  PT Education - 05/13/19 1439    Education Details  reviewed HEp and symptom mangement    Person(s) Educated  Patient    Methods  Explanation;Demonstration;Verbal cues;Tactile cues    Comprehension  Verbalized understanding;Returned demonstration;Tactile cues required;Verbal cues required       PT Short Term Goals - 05/13/19 1607      PT SHORT TERM GOAL #1   Title  Patient ewill perfrom log roll without hesitancy and pain    Baseline  independent with current HEP    Time  3    Period  Weeks    Target Date  05/27/19      PT SHORT TERM GOAL #2   Title  Patient will transfer sit to stand without using hands    Baseline  3/10 today    Time  3    Period  Weeks    Status  New    Target Date  05/27/19      PT SHORT TERM GOAL #3   Title  Patient will ambualte 300' with [facility] with good safety and stability    Time  3    Period  Weeks    Status  New    Target Date  05/27/19        PT Long Term Goals - 05/06/19 1652      PT LONG TERM GOAL #1   Title  Patient will ambualte 1000' with LRAD with good safety and without increased pain    Time   6    Period  Weeks    Status  New    Target Date  06/17/19      PT LONG TERM GOAL #2   Title  Patient will put his shoes on without pain    Time  6    Period  Weeks    Status  New    Target Date  06/17/19      PT LONG TERM GOAL #3   Title  Patient will stand for 1/2 hour without increased pain    Baseline  can not stand for more then 10 minutes    Time  6    Period  Weeks    Status  Achieved    Target Date  06/17/19            Plan - 05/13/19 1445    Clinical Impression Statement  Patient had low tolerance to LE exercises today. After SAQ her reported left LE was going numb. He had difficulty sitting up and getting himself positioned to sit up. Once sitting he had difficulty remaining sitting. He reported he had to lie down. The patient had to lie on his side for a few minutes before feeling like he could move. thjerapy educted him on perautions and advised him to talk to his MD about when hre can put his shoes on. Current HEp kept consitent. He used a walker to exit. He also had a question about muslce relaxers. Question was deffered to MD.    Comorbidities  long standing low back pain, anxiety, depression    Examination-Activity Limitations  Bed Mobility;Squat;Lift;Carry;Reach Overhead;Sit    Examination-Participation Restrictions  Shop;Community Activity;Cleaning;Laundry    Stability/Clinical Decision Making  Evolving/Moderate complexity    Clinical Decision Making  Moderate    Rehab Potential  Good    PT Frequency  3x / week    PT Treatment/Interventions  ADLs/Self Care Home Management;Cryotherapy;Electrical Stimulation;Traction;Moist Heat;Functional mobility training;Therapeutic activities;Therapeutic exercise;Gait training;DME Instruction;Neuromuscular re-education;Patient/family education;Manual techniques;Passive range of motion;Taping  PT Next Visit Plan  we will begin with a general strengthening and endurance program; Consdier light supine exercises next visit : SAQ  qaud sets; review eated HEP; consider functional strengthening such as sit to stand exercises; consdier ue exercises and standing exercisesa as tolerated. follow symptoms.take a FOTO    PT Home Exercise Plan  laq, hip abduction; hamstring curl all yellow and all with good posture    Consulted and Agree with Plan of Care  Patient       Patient will benefit from skilled therapeutic intervention in order to improve the following deficits and impairments:  Abnormal gait, Decreased activity tolerance, Pain, Increased fascial restricitons, Decreased strength, Decreased range of motion, Decreased endurance, Difficulty walking  Visit Diagnosis: Chronic bilateral low back pain without sciatica  Muscle weakness (generalized)  Other abnormalities of gait and mobility  Radiculopathy, lumbar region     Problem List Patient Active Problem List   Diagnosis Date Noted  . Gastroesophageal reflux disease without esophagitis 06/19/2017  . Need for hepatitis A vaccination 06/19/2017  . 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 Routine screening for STI (sexually transmitted infection) 06/07/2016  . SVT (supraventricular tachycardia) ([place]) 10/08/2014  . Depression 10/08/2014  . Atypical nevi 06/22/2014  . Elevated triglycerides with high cholesterol 09/06/2013  . Anxiety state 08/03/2013  . Essential hypertension, benign 08/03/2013  . Environmental allergies 03/23/2013  . Visit for preventive health examination 03/23/2013  . Meniere disease 03/23/2013  . Intrinsic asthma     Carney Living PT DPT  05/13/2019, 4:11 PM  [facility] [address] [place], [place], [address] Phone: [phone]   Fax:  [phone]  Name: Steve Jacobs MRN: [id number] Date of Birth: 08-14-1985

## 2019-05-18 ENCOUNTER — Ambulatory Visit: Payer: PRIVATE HEALTH INSURANCE | Attending: Orthopedic Surgery | Admitting: Physical Therapy

## 2019-05-18 ENCOUNTER — Other Ambulatory Visit: Payer: Self-pay

## 2019-05-18 ENCOUNTER — Encounter: Payer: Self-pay | Admitting: Physical Therapy

## 2019-05-18 DIAGNOSIS — R2689 Other abnormalities of gait and mobility: Secondary | ICD-10-CM | POA: Insufficient documentation

## 2019-05-18 DIAGNOSIS — M6281 Muscle weakness (generalized): Secondary | ICD-10-CM | POA: Insufficient documentation

## 2019-05-18 DIAGNOSIS — G8929 Other chronic pain: Secondary | ICD-10-CM | POA: Insufficient documentation

## 2019-05-18 DIAGNOSIS — M5416 Radiculopathy, lumbar region: Secondary | ICD-10-CM | POA: Diagnosis present

## 2019-05-18 DIAGNOSIS — M545 Low back pain, unspecified: Secondary | ICD-10-CM

## 2019-05-19 MED FILL — OXYCODONE-ACETAMINOPHEN 5-3: 5-325 | 7 days supply | Qty: 42 | Fill #0

## 2019-05-19 NOTE — Therapy (Signed)
[place], [place], [address] Phone: [phone]   Fax:  [phone]  Physical Therapy Treatment  Patient Details  Name: Steve Jacobs MRN: [id number] Date of Birth: April 19, 1985 Referring Provider (PT): Odelia Gage [facility]    Encounter Date: 05/18/2019  PT End of Session - 05/18/19 1521    Visit Number  3    Number of Visits  13    Date for PT Re-Evaluation  06/10/19    PT Start Time  1508   patient 8 minutes late   PT Stop Time  1546    PT Time Calculation (min)  38 min    Activity Tolerance  Patient tolerated treatment well    Behavior During Therapy  [facility] for tasks assessed/performed       Past Medical History:  Diagnosis Date  . Asthma   . Chicken pox   . Depression    Resolved  . Environmental and seasonal allergies   . Essential hypertension, benign 08/03/2013  . Hyperlipidemia   . Meniere's disease   . Thoracic outlet syndrome     Past Surgical History:  Procedure Laterality Date  . Nevus Biopsy      There were no vitals filed for this visit.  Subjective Assessment - 05/18/19 1519    Subjective  Patient reports over the past 2-3 days he has had some pain in the left side of his incision. He has more pain when he is standing and walking    Limitations  Standing;Walking    How long can you stand comfortably?  As the day goes on he has more difficulty standing    How long can you walk comfortably?  limited didstances    Diagnostic tests  Psot-op solid fusion    Patient Stated Goals  to go back to work    Currently in Pain?  Yes    Pain Score  3     Pain Location  Back    Pain Orientation  Left    Pain Descriptors / Indicators  Aching    Pain Type  Chronic pain    Pain Onset  More than a month ago    Aggravating Factors   standing and walking    Pain Relieving Factors  rest                       [facility] Adult PT Treatment/Exercise - 05/19/19 0001      Self-Care   Other Self-Care Comments   reviewed how touse HEP at home and how to progress at home       Lumbar Exercises: Seated   Other Seated Lumbar Exercises  LAQ 2x10 bilateral no reistance; calmashell 2x15 yellow;     Other Seated Lumbar Exercises  wand flexion 3x10 with cuing for neutral spine; bilateral er yellow to netral 2x10; bilateral horizontal abduction 2x10       Lumbar Exercises: Supine   AB Set Limitations  reviewed abdominla breathing. x5              PT Education - 05/18/19 1520    Education Details  updated HEP    Person(s) Educated  Patient    Methods  Explanation;Demonstration;Tactile cues;Verbal cues    Comprehension  Verbalized understanding;Returned demonstration;Verbal cues required;Tactile cues required       PT Short Term Goals - 05/18/19 1607      PT SHORT TERM GOAL #1   Title  Patient ewill perfrom log  roll without hesitancy and pain    Baseline  independent with current HEP    Time  3    Period  Weeks    Status  On-going    Target Date  05/27/19      PT SHORT TERM GOAL #2   Title  Patient will transfer sit to stand without using hands    Baseline  3/10 today    Time  3    Period  Weeks    Status  On-going    Target Date  05/27/19      PT SHORT TERM GOAL #3   Title  Patient will ambualte 300' with [facility] with good safety and stability    Time  3    Period  Weeks    Status  On-going    Target Date  05/27/19        PT Long Term Goals - 05/06/19 1652      PT LONG TERM GOAL #1   Title  Patient will ambualte 1000' with LRAD with good safety and without increased pain    Time  6    Period  Weeks    Status  New    Target Date  06/17/19      PT LONG TERM GOAL #2   Title  Patient will put his shoes on without pain    Time  6    Period  Weeks    Status  New    Target Date  06/17/19      PT LONG TERM GOAL #3   Title  Patient will stand for 1/2 hour without increased pain    Baseline  can not stand for more then 10 minutes    Time  6     Period  Weeks    Status  Achieved    Target Date  06/17/19            Plan - 05/18/19 1528    Clinical Impression Statement  Therapy reviewed seated core/postural exercises. He fatigues quickly but tolerated well. he started unsupported but becaem fatuged after about 15 minutes and had to sit in a chair with a back. Patient did well with right leg strengthening but fatigued quickly with the left. He did not require the walker to leave today.    Comorbidities  long standing low back pain, anxiety, depression    Examination-Activity Limitations  Bed Mobility;Squat;Lift;Carry;Reach Overhead;Sit    Stability/Clinical Decision Making  Evolving/Moderate complexity    Clinical Decision Making  Moderate    PT Frequency  3x / week    PT Duration  4 weeks    PT Treatment/Interventions  ADLs/Self Care Home Management;Cryotherapy;Electrical Stimulation;Traction;Moist Heat;Functional mobility training;Therapeutic activities;Therapeutic exercise;Gait training;DME Instruction;Neuromuscular re-education;Patient/family education;Manual techniques;Passive range of motion;Taping    PT Next Visit Plan  we will begin with a general strengthening and endurance program; Consdier light supine exercises next visit : SAQ qaud sets; review eated HEP; consider functional strengthening such as sit to stand exercises; consdier ue exercises and standing exercisesa as tolerated. follow symptoms.take a FOTO    PT Home Exercise Plan  laq, hip abduction; hamstring curl all yellow and all with good posture    Consulted and Agree with Plan of Care  Patient       Patient will benefit from skilled therapeutic intervention in order to improve the following deficits and impairments:  Abnormal gait, Decreased activity tolerance, Pain, Increased fascial restricitons, Decreased strength, Decreased range of motion, Decreased endurance, Difficulty walking  Visit Diagnosis: Chronic bilateral low back pain without sciatica  Muscle  weakness (generalized)  Other abnormalities of gait and mobility  Radiculopathy, lumbar region     Problem List Patient Active Problem List   Diagnosis Date Noted  . Gastroesophageal reflux disease without esophagitis 06/19/2017  . Need for hepatitis A vaccination 06/19/2017  . 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 Routine screening for STI (sexually transmitted infection) 06/07/2016  . SVT (supraventricular tachycardia) ([place]) 10/08/2014  . Depression 10/08/2014  . Atypical nevi 06/22/2014  . Elevated triglycerides with high cholesterol 09/06/2013  . Anxiety state 08/03/2013  . Essential hypertension, benign 08/03/2013  . Environmental allergies 03/23/2013  . Visit for preventive health examination 03/23/2013  . Meniere disease 03/23/2013  . Intrinsic asthma     Carney Living PT DPT  05/19/2019, 11:11 AM  [facility] [address] [place], [place], [address] Phone: [phone]   Fax:  [phone]  Name: Steve Jacobs MRN: [id number] Date of Birth: Sep 21, 1985

## 2019-05-20 ENCOUNTER — Other Ambulatory Visit: Payer: Self-pay

## 2019-05-20 ENCOUNTER — Ambulatory Visit: Payer: PRIVATE HEALTH INSURANCE | Admitting: Physical Therapy

## 2019-05-20 DIAGNOSIS — M545 Low back pain: Secondary | ICD-10-CM | POA: Diagnosis not present

## 2019-05-20 DIAGNOSIS — M5416 Radiculopathy, lumbar region: Secondary | ICD-10-CM

## 2019-05-20 DIAGNOSIS — R2689 Other abnormalities of gait and mobility: Secondary | ICD-10-CM

## 2019-05-20 DIAGNOSIS — G8929 Other chronic pain: Secondary | ICD-10-CM

## 2019-05-20 DIAGNOSIS — M6281 Muscle weakness (generalized): Secondary | ICD-10-CM

## 2019-05-20 MED FILL — LORazepam 2 MG TABS: 2 | 10 days supply | Qty: 30 | Fill #1

## 2019-05-20 MED FILL — LEVOCETIRIZINE 5 MG TABLET: 5 | 30 days supply | Qty: 30 | Fill #1

## 2019-05-21 ENCOUNTER — Encounter: Payer: Self-pay | Admitting: Physical Therapy

## 2019-05-21 ENCOUNTER — Other Ambulatory Visit: Payer: Self-pay | Admitting: Physician Assistant

## 2019-05-21 MED FILL — ZOLPIDEM TARTRATE 5 MG TAB: 5 | 30 days supply | Qty: 30 | Fill #0

## 2019-05-21 NOTE — Telephone Encounter (Signed)
Zolpidem last filled 04/12/19 # 15 with 1 refill Last office visit 11/11/18 No future visit scheduled

## 2019-05-21 NOTE — Therapy (Signed)
[place], [place], [address] Phone: [phone]   Fax:  [phone]  Physical Therapy Treatment  Patient Details  Name: Steve Jacobs MRN: [id number] Date of Birth: 1985-11-14 Referring Provider (PT): Odelia Gage [facility]    Encounter Date: 05/20/2019  PT End of Session - 05/21/19 1031    Visit Number  4    Number of Visits  13    Date for PT Re-Evaluation  06/10/19    PT Start Time  1500    PT Stop Time  1539    PT Time Calculation (min)  39 min    Activity Tolerance  Patient tolerated treatment well    Behavior During Therapy  [facility] for tasks assessed/performed       Past Medical History:  Diagnosis Date  . Asthma   . Chicken pox   . Depression    Resolved  . Environmental and seasonal allergies   . Essential hypertension, benign 08/03/2013  . Hyperlipidemia   . Meniere's disease   . Thoracic outlet syndrome     Past Surgical History:  Procedure Laterality Date  . Nevus Biopsy      There were no vitals filed for this visit.  Subjective Assessment - 05/21/19 1147    Subjective  Patient fell getting out of the bathtub. He fell on his left ischium. He feels pain where he fell. He reports he is moving slower today because oif the fall.    Limitations  Standing;Walking    How long can you stand comfortably?  As the day goes on he has more difficulty standing    How long can you walk comfortably?  limited didstances    Diagnostic tests  Psot-op solid fusion    Patient Stated Goals  to go back to work    Currently in Pain?  Yes    Pain Score  5     Pain Location  Back    Pain Orientation  Left    Pain Descriptors / Indicators  Aching    Pain Type  Chronic pain    Pain Onset  More than a month ago    Pain Frequency  Constant    Aggravating Factors   standing and walking    Pain Relieving Factors  rest                       [facility] Adult PT Treatment/Exercise - 05/21/19 0001       Ambulation/Gait   Gait Comments  walked 300' woith patient. cuing to stand up tall and flex hip       Self-Care   Other Self-Care Comments   reviewed how touse HEP at home and how to progress at home       Lumbar Exercises: Standing   Other Standing Lumbar Exercises  scap retraction 2x10 yellow; shoulder extension 2x10 yellow       Lumbar Exercises: Seated   Other Seated Lumbar Exercises  LAQ 2x10 bilateral no reistance; calmashell 2x15 yellow;     Other Seated Lumbar Exercises  wand flexion 3x10 with cuing for neutral spine; bilateral er yellow to netral 2x10; bilateral horizontal abduction 2x10       Lumbar Exercises: Supine   AB Set Limitations  reviewed abdominla breathing. x5              PT Education - 05/21/19 1149    Education Details  reviewed HEP and symptom management  Person(s) Educated  Patient    Methods  Explanation;Demonstration;Tactile cues;Verbal cues    Comprehension  Verbalized understanding;Tactile cues required;Verbal cues required;Returned demonstration       PT Short Term Goals - 05/18/19 1607      PT SHORT TERM GOAL #1   Title  Patient ewill perfrom log roll without hesitancy and pain    Baseline  independent with current HEP    Time  3    Period  Weeks    Status  On-going    Target Date  05/27/19      PT SHORT TERM GOAL #2   Title  Patient will transfer sit to stand without using hands    Baseline  3/10 today    Time  3    Period  Weeks    Status  On-going    Target Date  05/27/19      PT SHORT TERM GOAL #3   Title  Patient will ambualte 300' with [facility] with good safety and stability    Time  3    Period  Weeks    Status  On-going    Target Date  05/27/19        PT Long Term Goals - 05/06/19 1652      PT LONG TERM GOAL #1   Title  Patient will ambualte 1000' with LRAD with good safety and without increased pain    Time  6    Period  Weeks    Status  New    Target Date  06/17/19      PT LONG TERM GOAL #2   Title   Patient will put his shoes on without pain    Time  6    Period  Weeks    Status  New    Target Date  06/17/19      PT LONG TERM GOAL #3   Title  Patient will stand for 1/2 hour without increased pain    Baseline  can not stand for more then 10 minutes    Time  6    Period  Weeks    Status  Achieved    Target Date  06/17/19            Plan - 05/21/19 1028    Clinical Impression Statement  Patient was limited by pain following his fall today. Per insepction most of his new pain is in his buttockj area where he fell. He was advised to contact MD if the pain increases around the syurgery site or if he has increased radicular pain. he was abel to do retractions and shoulder extensions in standing but had to use a walker to walk out. Therapy will continue to progress as tolerated.    Comorbidities  long standing low back pain, anxiety, depression    Examination-Activity Limitations  Bed Mobility;Squat;Lift;Carry;Reach Overhead;Sit    Examination-Participation Restrictions  Shop;Community Activity;Cleaning;Laundry    Stability/Clinical Decision Making  Evolving/Moderate complexity    Clinical Decision Making  Moderate    Rehab Potential  Good    PT Frequency  3x / week    PT Duration  4 weeks    PT Treatment/Interventions  ADLs/Self Care Home Management;Cryotherapy;Electrical Stimulation;Traction;Moist Heat;Functional mobility training;Therapeutic activities;Therapeutic exercise;Gait training;DME Instruction;Neuromuscular re-education;Patient/family education;Manual techniques;Passive range of motion;Taping    PT Next Visit Plan  we will begin with a general strengthening and endurance program; Consdier light supine exercises next visit : SAQ qaud sets; review eated HEP; consider functional strengthening such as sit to  stand exercises; consdier ue exercises and standing exercisesa as tolerated. follow symptoms.take a FOTO    PT Home Exercise Plan  laq, hip abduction; hamstring curl all  yellow and all with good posture    Consulted and Agree with Plan of Care  Patient       Patient will benefit from skilled therapeutic intervention in order to improve the following deficits and impairments:     Visit Diagnosis: Chronic bilateral low back pain without sciatica  Muscle weakness (generalized)  Other abnormalities of gait and mobility  Radiculopathy, lumbar region     Problem List Patient Active Problem List   Diagnosis Date Noted  . Gastroesophageal reflux disease without esophagitis 06/19/2017  . Need for hepatitis A vaccination 06/19/2017  . 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 Routine screening for STI (sexually transmitted infection) 06/07/2016  . SVT (supraventricular tachycardia) ([place]) 10/08/2014  . Depression 10/08/2014  . Atypical nevi 06/22/2014  . Elevated triglycerides with high cholesterol 09/06/2013  . Anxiety state 08/03/2013  . Essential hypertension, benign 08/03/2013  . Environmental allergies 03/23/2013  . Visit for preventive health examination 03/23/2013  . Meniere disease 03/23/2013  . Intrinsic asthma     Carney Living PT DPT  05/21/2019, 11:50 AM  [facility] [address] [place], [place], [address] Phone: [phone]   Fax:  [phone]  Name: ANGELLO DURRER MRN: [id number] Date of Birth: 10-09-1985

## 2019-05-24 ENCOUNTER — Ambulatory Visit: Payer: PRIVATE HEALTH INSURANCE | Admitting: Physical Therapy

## 2019-05-25 ENCOUNTER — Other Ambulatory Visit: Payer: Self-pay | Admitting: Physician Assistant

## 2019-05-26 ENCOUNTER — Other Ambulatory Visit: Payer: Self-pay

## 2019-05-26 ENCOUNTER — Ambulatory Visit: Payer: PRIVATE HEALTH INSURANCE | Attending: Physician Assistant | Admitting: Physical Therapy

## 2019-05-26 ENCOUNTER — Encounter: Payer: Self-pay | Admitting: Physical Therapy

## 2019-05-26 DIAGNOSIS — M5416 Radiculopathy, lumbar region: Secondary | ICD-10-CM | POA: Diagnosis present

## 2019-05-26 DIAGNOSIS — G8929 Other chronic pain: Secondary | ICD-10-CM | POA: Insufficient documentation

## 2019-05-26 DIAGNOSIS — R2689 Other abnormalities of gait and mobility: Secondary | ICD-10-CM | POA: Insufficient documentation

## 2019-05-26 DIAGNOSIS — M6281 Muscle weakness (generalized): Secondary | ICD-10-CM | POA: Diagnosis present

## 2019-05-26 DIAGNOSIS — M545 Low back pain: Secondary | ICD-10-CM | POA: Insufficient documentation

## 2019-05-27 ENCOUNTER — Ambulatory Visit: Payer: PRIVATE HEALTH INSURANCE | Attending: Physician Assistant | Admitting: Physical Therapy

## 2019-05-27 ENCOUNTER — Encounter: Payer: Self-pay | Admitting: Physical Therapy

## 2019-05-27 DIAGNOSIS — M545 Low back pain, unspecified: Secondary | ICD-10-CM

## 2019-05-27 DIAGNOSIS — M5416 Radiculopathy, lumbar region: Secondary | ICD-10-CM | POA: Insufficient documentation

## 2019-05-27 DIAGNOSIS — R2689 Other abnormalities of gait and mobility: Secondary | ICD-10-CM | POA: Diagnosis present

## 2019-05-27 DIAGNOSIS — G8929 Other chronic pain: Secondary | ICD-10-CM | POA: Diagnosis present

## 2019-05-27 DIAGNOSIS — M6281 Muscle weakness (generalized): Secondary | ICD-10-CM | POA: Insufficient documentation

## 2019-05-27 NOTE — Therapy (Signed)
[place], [place], [address] Phone: [phone]   Fax:  [phone]  Physical Therapy Treatment  Patient Details  Name: Steve Jacobs MRN: [id number] Date of Birth: [birth year] Referring Provider (PT): Odelia Gage [facility]    Encounter Date: 05/27/2019  PT End of Session - 05/27/19 1552    Visit Number  6    Number of Visits  13    Date for PT Re-Evaluation  06/10/19    PT Start Time  [id number]    PT Stop Time  1628    PT Time Calculation (min)  45 min    Activity Tolerance  Patient tolerated treatment well    Behavior During Therapy  [facility] for tasks assessed/performed       Past Medical History:  Diagnosis Date  . Asthma   . Chicken pox   . Depression    Resolved  . Environmental and seasonal allergies   . Essential hypertension, benign 08/03/2013  . Hyperlipidemia   . Meniere's disease   . Thoracic outlet syndrome     Past Surgical History:  Procedure Laterality Date  . Nevus Biopsy      There were no vitals filed for this visit.  Subjective Assessment - 05/27/19 1549    Subjective  Patient went home last night and had progressive pain. He began taking pain medication which gave him a rash. He had significant pain in his right knee. His right knee was buckling. His pain is more under control today. he spent the night in bed. Because oif his pain medication he has a rash everywhere. He also feels like his asthma flaired up.    Limitations  Standing;Walking    How long can you stand comfortably?  As the day goes on he has more difficulty standing    How long can you walk comfortably?  limited didstances    Diagnostic tests  Psot-op solid fusion    Patient Stated Goals  to go back to work    Currently in Pain?  Yes    Pain Score  2     Pain Location  Back    Pain Orientation  Left    Pain Descriptors / Indicators  Aching    Pain Type  Chronic pain    Pain Onset  More than a month ago    Pain  Frequency  Constant    Aggravating Factors   standing and walking    Pain Relieving Factors  rest                        [facility] Adult PT Treatment/Exercise - 05/27/19 1627      Self-Care   Other Self-Care Comments   reviewed expected resposes to exercises and unexpected resposes       Lumbar Exercises: Seated   Other Seated Lumbar Exercises  LAQ 3x10;  clamshell 3x10 yellow; hmastring curl 3x10 yellow     Other Seated Lumbar Exercises  wand flxion 3x10; band er yellow 3x10; horizontal abduction yellow 3x10;              PT Education - 05/27/19 1552    Education Details  HEp and symptom mangement    Person(s) Educated  Patient    Methods  Explanation;Demonstration;Tactile cues;Verbal cues    Comprehension  Verbalized understanding;Returned demonstration;Verbal cues required;Tactile cues required       PT Short Term Goals - 05/27/19 1131      PT  SHORT TERM GOAL #1   Title  Patient ewill perfrom log roll without hesitancy and pain    Baseline  independent with current HEP    Time  3    Period  Weeks    Status  On-going    Target Date  05/27/19      PT SHORT TERM GOAL #2   Title  Patient will transfer sit to stand without using hands    Baseline  3/10 today    Time  3    Period  Weeks    Status  On-going    Target Date  05/27/19      PT SHORT TERM GOAL #3   Title  Patient will ambualte 300' with [facility] with good safety and stability    Time  3    Period  Weeks    Status  On-going    Target Date  05/27/19        PT Long Term Goals - 05/06/19 1652      PT LONG TERM GOAL #1   Title  Patient will ambualte 1000' with LRAD with good safety and without increased pain    Time  6    Period  Weeks    Status  New    Target Date  06/17/19      PT LONG TERM GOAL #2   Title  Patient will put his shoes on without pain    Time  6    Period  Weeks    Status  New    Target Date  06/17/19      PT LONG TERM GOAL #3   Title  Patient will stand for 1/2  hour without increased pain    Baseline  can not stand for more then 10 minutes    Time  6    Period  Weeks    Status  Achieved    Target Date  06/17/19            Plan - 05/27/19 1621    Clinical Impression Statement  Patient perfr4omed light ther-ex without difficulty. He had minor pain at times but no significant increase in pain. Patient perfreomed seated exercises only. Therapy will assess toelrance to activity tomorrow.    Personal Factors and Comorbidities  Comorbidity 1;Comorbidity 2    Comorbidities  long standing low back pain, anxiety, depression    Examination-Activity Limitations  Bed Mobility;Squat;Lift;Carry;Reach Overhead;Sit    Examination-Participation Restrictions  Shop;Community Activity;Cleaning;Laundry    Stability/Clinical Decision Making  Evolving/Moderate complexity    Clinical Decision Making  Moderate    Rehab Potential  Good    PT Frequency  3x / week    PT Duration  4 weeks    PT Treatment/Interventions  ADLs/Self Care Home Management;Cryotherapy;Electrical Stimulation;Traction;Moist Heat;Functional mobility training;Therapeutic activities;Therapeutic exercise;Gait training;DME Instruction;Neuromuscular re-education;Patient/family education;Manual techniques;Passive range of motion;Taping    PT Next Visit Plan  Continue general strengthening, light exercise in supine, and endurance program. Review HEP. Consider functional strengthening exercises for UE and LE.    PT Home Exercise Plan  Provided red theraband; pt to continue current HEP.    Consulted and Agree with Plan of Care  Patient       Patient will benefit from skilled therapeutic intervention in order to improve the following deficits and impairments:  Abnormal gait, Decreased activity tolerance, Pain, Increased fascial restricitons, Decreased strength, Decreased range of motion, Decreased endurance, Difficulty walking  Visit Diagnosis: Chronic bilateral low back pain without sciatica  Muscle  weakness (  generalized)  Other abnormalities of gait and mobility  Radiculopathy, lumbar region     Problem List Patient Active Problem List   Diagnosis Date Noted  . Gastroesophageal reflux disease without esophagitis 06/19/2017  . Need for hepatitis A vaccination 06/19/2017  . 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 Routine screening for STI (sexually transmitted infection) 06/07/2016  . SVT (supraventricular tachycardia) ([place]) 10/08/2014  . Depression 10/08/2014  . Atypical nevi 06/22/2014  . Elevated triglycerides with high cholesterol 09/06/2013  . Anxiety state 08/03/2013  . Essential hypertension, benign 08/03/2013  . Environmental allergies 03/23/2013  . Visit for preventive health examination 03/23/2013  . Meniere disease 03/23/2013  . Intrinsic asthma     Carney Living PT DPT  05/27/2019, 4:35 PM  [facility] [address] [place], [place], [address] Phone: [phone]   Fax:  [phone]  Name: REGINA BLANCETT MRN: [id number] Date of Birth: 1985/09/17

## 2019-05-27 NOTE — Therapy (Signed)
[place], [place], [address] Phone: [phone]   Fax:  [phone]  Physical Therapy Treatment  Patient Details  Name: Steve Jacobs MRN: [id number] Date of Birth: Jan 17, 1985 Referring Provider (PT): Odelia Gage [facility]    Encounter Date: 05/26/2019  PT End of Session - 05/26/19 1625    Visit Number  5    Number of Visits  13    Date for PT Re-Evaluation  06/10/19    PT Start Time  1550    PT Stop Time  1628    PT Time Calculation (min)  38 min    Activity Tolerance  Patient tolerated treatment well    Behavior During Therapy  [facility] for tasks assessed/performed       Past Medical History:  Diagnosis Date  . Asthma   . Chicken pox   . Depression    Resolved  . Environmental and seasonal allergies   . Essential hypertension, benign 08/03/2013  . Hyperlipidemia   . Meniere's disease   . Thoracic outlet syndrome     Past Surgical History:  Procedure Laterality Date  . Nevus Biopsy      There were no vitals filed for this visit.  Subjective Assessment - 05/26/19 1559    Subjective  Patient spoke with his MD today regaurding a tight feeling in his side. The MD reassured him that these feeling are not aprobelm. He was given Robaxin to see if it eases the painful feeling. He tolerated previous treatment well. He has had no further falls. He feels the area in his buttock that was sore has cleared up.    Limitations  Standing;Walking    How long can you stand comfortably?  As the day goes on he has more difficulty standing    How long can you walk comfortably?  limited didstances    Diagnostic tests  Psot-op solid fusion    Patient Stated Goals  to go back to work    Currently in Pain?  Yes    Pain Score  5     Pain Location  Back    Pain Orientation  Left    Pain Descriptors / Indicators  Aching    Pain Type  Chronic pain    Pain Onset  More than a month ago    Pain Frequency  Constant     Aggravating Factors   stanidng and walking    Pain Relieving Factors  rest    Multiple Pain Sites  No                        [facility] Adult PT Treatment/Exercise - 05/27/19 0001      Lumbar Exercises: Aerobic   Nustep  5 min at level 3      Lumbar Exercises: Seated   Other Seated Lumbar Exercises  wand flexion 2x10 with cueing; bilateral ER red to neutral x15; bilateral horizontal abduction x15      Lumbar Exercises: Supine   Bent Knee Raise  10 reps    Bent Knee Raise Limitations  bilateral    Other Supine Lumbar Exercises  Gentle rocking side to side 2x10    Other Supine Lumbar Exercises  SAQ x10 on left, x12 on right             PT Education - 05/26/19 1634    Education Details  reviewed symtpom management    Person(s) Educated  Patient  Methods  Explanation    Comprehension  Verbalized understanding       PT Short Term Goals - 05/27/19 1131      PT SHORT TERM GOAL #1   Title  Patient ewill perfrom log roll without hesitancy and pain    Baseline  independent with current HEP    Time  3    Period  Weeks    Status  On-going    Target Date  05/27/19      PT SHORT TERM GOAL #2   Title  Patient will transfer sit to stand without using hands    Baseline  3/10 today    Time  3    Period  Weeks    Status  On-going    Target Date  05/27/19      PT SHORT TERM GOAL #3   Title  Patient will ambualte 300' with [facility] with good safety and stability    Time  3    Period  Weeks    Status  On-going    Target Date  05/27/19        PT Long Term Goals - 05/06/19 1652      PT LONG TERM GOAL #1   Title  Patient will ambualte 1000' with LRAD with good safety and without increased pain    Time  6    Period  Weeks    Status  New    Target Date  06/17/19      PT LONG TERM GOAL #2   Title  Patient will put his shoes on without pain    Time  6    Period  Weeks    Status  New    Target Date  06/17/19      PT LONG TERM GOAL #3   Title  Patient will  stand for 1/2 hour without increased pain    Baseline  can not stand for more then 10 minutes    Time  6    Period  Weeks    Status  Achieved    Target Date  06/17/19            Plan - 05/26/19 1637    Clinical Impression Statement  Patient continues to be limited by pain. Patient was able to tolerate increased resistance with resistance band strengthening in sitting and improved tolerance to supine lumbar exercises. Therapist added gentle rocking motion for lumbar ROM. Therapy will continue to progress as tolerated    Personal Factors and Comorbidities  Comorbidity 1;Comorbidity 2    Comorbidities  long standing low back pain, anxiety, depression    Examination-Activity Limitations  Bed Mobility;Squat;Lift;Carry;Reach Overhead;Sit    Stability/Clinical Decision Making  Evolving/Moderate complexity    Clinical Decision Making  Moderate    Rehab Potential  Good    PT Frequency  3x / week    PT Duration  4 weeks    PT Treatment/Interventions  ADLs/Self Care Home Management;Cryotherapy;Electrical Stimulation;Traction;Moist Heat;Functional mobility training;Therapeutic activities;Therapeutic exercise;Gait training;DME Instruction;Neuromuscular re-education;Patient/family education;Manual techniques;Passive range of motion;Taping    PT Next Visit Plan  Continue general strengthening, light exercise in supine, and endurance program. Review HEP. Consider functional strengthening exercises for UE and LE.    PT Home Exercise Plan  Provided red theraband; pt to continue current HEP.    Consulted and Agree with Plan of Care  Patient       Patient will benefit from skilled therapeutic intervention in order to improve the following deficits and impairments:  Abnormal gait, Decreased activity tolerance, Pain, Increased fascial restricitons, Decreased strength, Decreased range of motion, Decreased endurance, Difficulty walking  Visit Diagnosis: Chronic bilateral low back pain without  sciatica  Muscle weakness (generalized)  Other abnormalities of gait and mobility  Radiculopathy, lumbar region     Problem List Patient Active Problem List   Diagnosis Date Noted  . Gastroesophageal reflux disease without esophagitis 06/19/2017  . Need for hepatitis A vaccination 06/19/2017  . 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 Routine screening for STI (sexually transmitted infection) 06/07/2016  . SVT (supraventricular tachycardia) ([place]) 10/08/2014  . Depression 10/08/2014  . Atypical nevi 06/22/2014  . Elevated triglycerides with high cholesterol 09/06/2013  . Anxiety state 08/03/2013  . Essential hypertension, benign 08/03/2013  . Environmental allergies 03/23/2013  . Visit for preventive health examination 03/23/2013  . Meniere disease 03/23/2013  . Intrinsic asthma     Carney Living PT DPT  05/27/2019, 11:33 AM  [facility] [address] [place], [place], [address] Phone: [phone]   Fax:  [phone]  Name: Steve Jacobs MRN: [id number] Date of Birth: May 16, 1985

## 2019-05-28 ENCOUNTER — Ambulatory Visit: Payer: PRIVATE HEALTH INSURANCE | Admitting: Physical Therapy

## 2019-05-28 ENCOUNTER — Encounter: Payer: Self-pay | Admitting: Physical Therapy

## 2019-05-28 ENCOUNTER — Other Ambulatory Visit: Payer: Self-pay

## 2019-05-28 DIAGNOSIS — R2689 Other abnormalities of gait and mobility: Secondary | ICD-10-CM

## 2019-05-28 DIAGNOSIS — M5416 Radiculopathy, lumbar region: Secondary | ICD-10-CM

## 2019-05-28 DIAGNOSIS — M6281 Muscle weakness (generalized): Secondary | ICD-10-CM

## 2019-05-28 DIAGNOSIS — G8929 Other chronic pain: Secondary | ICD-10-CM

## 2019-05-28 DIAGNOSIS — M545 Low back pain, unspecified: Secondary | ICD-10-CM

## 2019-05-28 NOTE — Therapy (Signed)
[place], [place], [address] Phone: [phone]   Fax:  [phone]  Physical Therapy Treatment  Patient Details  Name: Steve Jacobs MRN: [id number] Date of Birth: 07/29/1985 Referring Provider (PT): Odelia Gage [facility]    Encounter Date: 05/28/2019  PT End of Session - 05/28/19 1108    Visit Number  7    Number of Visits  13    Date for PT Re-Evaluation  06/10/19    PT Start Time  1102    PT Stop Time  1142    PT Time Calculation (min)  40 min    Activity Tolerance  Patient tolerated treatment well    Behavior During Therapy  [facility] for tasks assessed/performed       Past Medical History:  Diagnosis Date  . Asthma   . Chicken pox   . Depression    Resolved  . Environmental and seasonal allergies   . Essential hypertension, benign 08/03/2013  . Hyperlipidemia   . Meniere's disease   . Thoracic outlet syndrome     Past Surgical History:  Procedure Laterality Date  . Nevus Biopsy      There were no vitals filed for this visit.  Subjective Assessment - 05/28/19 1107    Subjective  Patient reports his back is feeling better. He was able to sleep tonight.    Limitations  Standing;Walking    How long can you stand comfortably?  As the day goes on he has more difficulty standing    How long can you walk comfortably?  limited didstances    Diagnostic tests  Psot-op solid fusion    Patient Stated Goals  to go back to work    Currently in Pain?  Yes    Pain Score  2     Pain Location  Back    Pain Orientation  Mid;Lower    Pain Descriptors / Indicators  Aching    Pain Type  Chronic pain    Pain Onset  More than a month ago    Pain Frequency  Constant    Aggravating Factors   Standing and walking    Pain Relieving Factors  rest    Multiple Pain Sites  No                        [facility] Adult PT Treatment/Exercise - 05/28/19 0001      Self-Care   Other Self-Care Comments    reviewed use of HEP at home       Lumbar Exercises: Aerobic   Stationary Bike  5 min       Lumbar Exercises: Standing   Heel Raises Limitations  2x10     Row Limitations  3x10 yellow     Other Standing Lumbar Exercises  standing hip andcution 2x7 bilaterl; standing march 2x10 yellow       Lumbar Exercises: Seated   Other Seated Lumbar Exercises  seated wand flexion 3x10; d2 flexion seated 2x10 each arm              PT Education - 05/28/19 1108    Education Details  reviewed tehcnique with standing exercises    Person(s) Educated  Patient    Methods  Explanation;Demonstration;Tactile cues;Verbal cues    Comprehension  Verbalized understanding;Verbal cues required;Returned demonstration;Tactile cues required       PT Short Term Goals - 05/27/19 1131      PT SHORT TERM GOAL #1  Title  Patient ewill perfrom log roll without hesitancy and pain    Baseline  independent with current HEP    Time  3    Period  Weeks    Status  On-going    Target Date  05/27/19      PT SHORT TERM GOAL #2   Title  Patient will transfer sit to stand without using hands    Baseline  3/10 today    Time  3    Period  Weeks    Status  On-going    Target Date  05/27/19      PT SHORT TERM GOAL #3   Title  Patient will ambualte 300' with [facility] with good safety and stability    Time  3    Period  Weeks    Status  On-going    Target Date  05/27/19        PT Long Term Goals - 05/06/19 1652      PT LONG TERM GOAL #1   Title  Patient will ambualte 1000' with LRAD with good safety and without increased pain    Time  6    Period  Weeks    Status  New    Target Date  06/17/19      PT LONG TERM GOAL #2   Title  Patient will put his shoes on without pain    Time  6    Period  Weeks    Status  New    Target Date  06/17/19      PT LONG TERM GOAL #3   Title  Patient will stand for 1/2 hour without increased pain    Baseline  can not stand for more then 10 minutes    Time  6    Period   Weeks    Status  Achieved    Target Date  06/17/19            Plan - 05/28/19 1133    Clinical Impression Statement  Therapy added lower extremity standing ther-ex today. He tolerated well but required seated rest breaks. Better tolerance to activity today then yesterday. Therapy will continue to progress as tolerated. He was given updated HEP for home.    Personal Factors and Comorbidities  Comorbidity 1;Comorbidity 2    Comorbidities  long standing low back pain, anxiety, depression    Examination-Participation Restrictions  Shop;Community Activity;Cleaning;Laundry    Stability/Clinical Decision Making  Evolving/Moderate complexity    Clinical Decision Making  Moderate    Rehab Potential  Good    PT Frequency  3x / week    PT Duration  4 weeks    PT Treatment/Interventions  ADLs/Self Care Home Management;Cryotherapy;Electrical Stimulation;Traction;Moist Heat;Functional mobility training;Therapeutic activities;Therapeutic exercise;Gait training;DME Instruction;Neuromuscular re-education;Patient/family education;Manual techniques;Passive range of motion;Taping    PT Next Visit Plan  Continue general strengthening, light exercise in supine, and endurance program. Review HEP. Consider functional strengthening exercises for UE and LE.    PT Home Exercise Plan  Provided red theraband; pt to continue current HEP.    Consulted and Agree with Plan of Care  Patient       Patient will benefit from skilled therapeutic intervention in order to improve the following deficits and impairments:  Abnormal gait, Decreased activity tolerance, Pain, Increased fascial restricitons, Decreased strength, Decreased range of motion, Decreased endurance, Difficulty walking  Visit Diagnosis: Chronic bilateral low back pain without sciatica  Muscle weakness (generalized)  Other abnormalities of gait and mobility  Radiculopathy,  lumbar region     Problem List Patient Active Problem List   Diagnosis  Date Noted  . Gastroesophageal reflux disease without esophagitis 06/19/2017  . Need for hepatitis A vaccination 06/19/2017  . 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 Routine screening for STI (sexually transmitted infection) 06/07/2016  . SVT (supraventricular tachycardia) ([place]) 10/08/2014  . Depression 10/08/2014  . Atypical nevi 06/22/2014  . Elevated triglycerides with high cholesterol 09/06/2013  . Anxiety state 08/03/2013  . Essential hypertension, benign 08/03/2013  . Environmental allergies 03/23/2013  . Visit for preventive health examination 03/23/2013  . Meniere disease 03/23/2013  . Intrinsic asthma     Carney Living PT DPT  05/28/2019, 11:55 AM  [facility] [address] [place], [place], [address] Phone: [phone]   Fax:  [phone]  Name: Steve Jacobs MRN: [id number] Date of Birth: October 29, 1985

## 2019-05-31 ENCOUNTER — Ambulatory Visit: Payer: PRIVATE HEALTH INSURANCE | Attending: Physician Assistant | Admitting: Physical Therapy

## 2019-05-31 ENCOUNTER — Other Ambulatory Visit: Payer: Self-pay | Admitting: Physician Assistant

## 2019-05-31 ENCOUNTER — Other Ambulatory Visit: Payer: Self-pay

## 2019-05-31 ENCOUNTER — Encounter: Payer: Self-pay | Admitting: Physical Therapy

## 2019-05-31 DIAGNOSIS — G8929 Other chronic pain: Secondary | ICD-10-CM | POA: Diagnosis present

## 2019-05-31 DIAGNOSIS — M545 Low back pain, unspecified: Secondary | ICD-10-CM

## 2019-05-31 DIAGNOSIS — M5416 Radiculopathy, lumbar region: Secondary | ICD-10-CM | POA: Insufficient documentation

## 2019-05-31 DIAGNOSIS — R2689 Other abnormalities of gait and mobility: Secondary | ICD-10-CM | POA: Insufficient documentation

## 2019-05-31 DIAGNOSIS — M6281 Muscle weakness (generalized): Secondary | ICD-10-CM | POA: Diagnosis present

## 2019-05-31 MED FILL — ATORVASTATIN 20 MG TABLET: 20 | 90 days supply | Qty: 90 | Fill #0

## 2019-06-01 ENCOUNTER — Encounter: Payer: Self-pay | Admitting: Physical Therapy

## 2019-06-01 ENCOUNTER — Other Ambulatory Visit: Payer: Self-pay | Admitting: Physician Assistant

## 2019-06-01 NOTE — Therapy (Signed)
[place], [place], [address] Phone: [phone]   Fax:  [phone]  Physical Therapy Treatment  Patient Details  Name: Steve Jacobs MRN: [id number] Date of Birth: 1985-11-15 Referring Provider (PT): Odelia Gage [facility]    Encounter Date: 05/31/2019  PT End of Session - 05/31/19 1618    Visit Number  8    Number of Visits  13    Date for PT Re-Evaluation  06/10/19    PT Start Time  [id number]    PT Stop Time  1624    PT Time Calculation (min)  39 min    Activity Tolerance  Patient tolerated treatment well    Behavior During Therapy  [facility] for tasks assessed/performed       Past Medical History:  Diagnosis Date  . Asthma   . Chicken pox   . Depression    Resolved  . Environmental and seasonal allergies   . Essential hypertension, benign 08/03/2013  . Hyperlipidemia   . Meniere's disease   . Thoracic outlet syndrome     Past Surgical History:  Procedure Laterality Date  . Nevus Biopsy      There were no vitals filed for this visit.  Subjective Assessment - 06/01/19 0828    Subjective  Patient ran out of pain medicine over the weekend. He was in a lott of pain but was able to do some light things in the community.    Limitations  Standing;Walking    How long can you stand comfortably?  As the day goes on he has more difficulty standing    How long can you walk comfortably?  limited didstances    Diagnostic tests  Psot-op solid fusion    Patient Stated Goals  to go back to work    Currently in Pain?  Yes    Pain Score  2     Pain Location  Back    Pain Orientation  Mid;Lower    Pain Descriptors / Indicators  Aching    Pain Type  Chronic pain;Surgical pain    Pain Onset  More than a month ago    Pain Frequency  Constant    Aggravating Factors   standing and walking    Pain Relieving Factors  rest    Multiple Pain Sites  No                        [facility] Adult PT  Treatment/Exercise - 06/01/19 0001      Lumbar Exercises: Aerobic   Stationary Bike  6 min L 3       Lumbar Exercises: Standing   Heel Raises Limitations  3x10     Row Limitations  3x10 yellow     Other Standing Lumbar Exercises  standing march 3x10; standing hip abduction 3x10 bilateral;       Lumbar Exercises: Seated   Other Seated Lumbar Exercises  seated wand flexion 3x10;seated bilateral er 3x10 red;               PT Education - 06/01/19 0832    Education Details  reviewed standing exercises    Person(s) Educated  Patient    Methods  Explanation;Demonstration;Tactile cues;Verbal cues    Comprehension  Verbalized understanding;Returned demonstration;Verbal cues required;Tactile cues required       PT Short Term Goals - 05/27/19 1131      PT SHORT TERM GOAL #1   Title  Patient ewill perfrom  log roll without hesitancy and pain    Baseline  independent with current HEP    Time  3    Period  Weeks    Status  On-going    Target Date  05/27/19      PT SHORT TERM GOAL #2   Title  Patient will transfer sit to stand without using hands    Baseline  3/10 today    Time  3    Period  Weeks    Status  On-going    Target Date  05/27/19      PT SHORT TERM GOAL #3   Title  Patient will ambualte 300' with [facility] with good safety and stability    Time  3    Period  Weeks    Status  On-going    Target Date  05/27/19        PT Long Term Goals - 05/06/19 1652      PT LONG TERM GOAL #1   Title  Patient will ambualte 1000' with LRAD with good safety and without increased pain    Time  6    Period  Weeks    Status  New    Target Date  06/17/19      PT LONG TERM GOAL #2   Title  Patient will put his shoes on without pain    Time  6    Period  Weeks    Status  New    Target Date  06/17/19      PT LONG TERM GOAL #3   Title  Patient will stand for 1/2 hour without increased pain    Baseline  can not stand for more then 10 minutes    Time  6    Period  Weeks     Status  Achieved    Target Date  06/17/19            Plan - 05/31/19 1621    Clinical Impression Statement  Patient tolerated treatment well today. He continues to have pain towards the end of his sets but it is tolerabvle. he was able to do standing shoulder and LE strengthening. Therapy will continue to advance as tolerated.    Personal Factors and Comorbidities  Comorbidity 1;Comorbidity 2    Comorbidities  long standing low back pain, anxiety, depression    Examination-Activity Limitations  Bed Mobility;Squat;Lift;Carry;Reach Overhead;Sit    Stability/Clinical Decision Making  Evolving/Moderate complexity    Clinical Decision Making  Moderate    Rehab Potential  Good    PT Frequency  3x / week    PT Duration  4 weeks    PT Treatment/Interventions  ADLs/Self Care Home Management;Cryotherapy;Electrical Stimulation;Traction;Moist Heat;Functional mobility training;Therapeutic activities;Therapeutic exercise;Gait training;DME Instruction;Neuromuscular re-education;Patient/family education;Manual techniques;Passive range of motion;Taping    PT Home Exercise Plan  Provided red theraband; pt to continue current HEP.       Patient will benefit from skilled therapeutic intervention in order to improve the following deficits and impairments:  Abnormal gait, Decreased activity tolerance, Pain, Increased fascial restricitons, Decreased strength, Decreased range of motion, Decreased endurance, Difficulty walking  Visit Diagnosis: Chronic bilateral low back pain without sciatica  Muscle weakness (generalized)  Other abnormalities of gait and mobility  Radiculopathy, lumbar region     Problem List Patient Active Problem List   Diagnosis Date Noted  . Gastroesophageal reflux disease without esophagitis 06/19/2017  . Need for hepatitis A vaccination 06/19/2017  . 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 Routine screening for  STI (sexually transmitted infection) 06/07/2016  . SVT  (supraventricular tachycardia) ([place]) 10/08/2014  . Depression 10/08/2014  . Atypical nevi 06/22/2014  . Elevated triglycerides with high cholesterol 09/06/2013  . Anxiety state 08/03/2013  . Essential hypertension, benign 08/03/2013  . Environmental allergies 03/23/2013  . Visit for preventive health examination 03/23/2013  . Meniere disease 03/23/2013  . Intrinsic asthma     Carney Living 06/01/2019, 8:43 AM  [facility] [address] [place], [place], [address] Phone: [phone]   Fax:  [phone]  Name: Steve Jacobs MRN: [id number] Date of Birth: 1985-11-06

## 2019-06-01 NOTE — Telephone Encounter (Signed)
  LAST APPOINTMENT DATE: 11/11/2018   NEXT APPOINTMENT DATE:@6 /01/2019  MEDICATION:LORazepam (ATIVAN) 2 MG tablet  PHARMACY:[place], [place] - [address].  **Let patient know to contact pharmacy at the end of the day to make sure medication is ready. **  ** Please notify patient to allow 48-72 hours to process**  **Encourage patient to contact the pharmacy for refills or they can request refills through [facility]**  CLINICAL FILLS OUT ALL BELOW:   LAST REFILL:  QTY:  REFILL DATE:    OTHER COMMENTS:    Okay for refill?  Please advise

## 2019-06-02 ENCOUNTER — Ambulatory Visit: Payer: PRIVATE HEALTH INSURANCE | Admitting: Physical Therapy

## 2019-06-02 ENCOUNTER — Other Ambulatory Visit: Payer: Self-pay

## 2019-06-02 DIAGNOSIS — M5416 Radiculopathy, lumbar region: Secondary | ICD-10-CM

## 2019-06-02 DIAGNOSIS — G8929 Other chronic pain: Secondary | ICD-10-CM

## 2019-06-02 DIAGNOSIS — R2689 Other abnormalities of gait and mobility: Secondary | ICD-10-CM

## 2019-06-02 DIAGNOSIS — M545 Low back pain: Secondary | ICD-10-CM | POA: Diagnosis not present

## 2019-06-02 DIAGNOSIS — M6281 Muscle weakness (generalized): Secondary | ICD-10-CM

## 2019-06-02 NOTE — Therapy (Signed)
[place], [place], [address] Phone: [phone]   Fax:  [phone]  Physical Therapy Treatment  Patient Details  Name: Steve Jacobs MRN: [id number] Date of Birth: 1985-08-24 Referring Provider (PT): Odelia Gage [facility]    Encounter Date: 06/02/2019  PT End of Session - 06/02/19 1503    Visit Number  9    Number of Visits  13    Date for PT Re-Evaluation  06/10/19    PT Start Time  [id number]    PT Stop Time  1457    PT Time Calculation (min)  41 min       Past Medical History:  Diagnosis Date  . Asthma   . Chicken pox   . Depression    Resolved  . Environmental and seasonal allergies   . Essential hypertension, benign 08/03/2013  . Hyperlipidemia   . Meniere's disease   . Thoracic outlet syndrome     Past Surgical History:  Procedure Laterality Date  . Nevus Biopsy      There were no vitals filed for this visit.  Subjective Assessment - 06/02/19 1422    Subjective  "yesterday i was at the grocery store and my cart locked up on me and I had to carry my grocery's and my shoulder blades were  pretty"    How long can you stand comfortably?  As the day goes on he has more difficulty standing    Currently in Pain?  Yes         [facility] PT Assessment - 06/02/19 0001      Assessment   Medical Diagnosis  Lumbar fusion/ lamiectomy     Referring Provider (PT)  Elta [facility]     Onset Date/Surgical Date  04/22/19                    [name] Adult PT Treatment/Exercise - 06/02/19 0001      Lumbar Exercises: Stretches   Hip Flexor Stretch  2 reps;30 seconds;Right   in //     Lumbar Exercises: Aerobic   Nustep  L4 x 76min UE/LE      Lumbar Exercises: Standing   Heel Raises Limitations  2x10     Other Standing Lumbar Exercises  standing marching alternating L/R with bil UE HHA 2 x 15, lateral band walks 2 x 15    Other Standing Lumbar Exercises  pressing hands down onto red physioball 1  x 10 holding 2-3 seconds      Lumbar Exercises: Seated   Sit to Stand  5 reps   x 2 cues for proper form   Sit to Stand Limitations  focused attention elsewhere and pt noted improved ease.             PT Education - 06/02/19 1500    Education Details  updated HEP today    Person(s) Educated  Patient    Methods  Explanation;Verbal cues;Handout    Comprehension  Verbalized understanding;Verbal cues required       PT Short Term Goals - 05/27/19 1131      PT SHORT TERM GOAL #1   Title  Patient ewill perfrom log roll without hesitancy and pain    Baseline  independent with current HEP    Time  3    Period  Weeks    Status  On-going    Target Date  05/27/19      PT SHORT TERM GOAL #2  Title  Patient will transfer sit to stand without using hands    Baseline  3/10 today    Time  3    Period  Weeks    Status  On-going    Target Date  05/27/19      PT SHORT TERM GOAL #3   Title  Patient will ambualte 60' with [facility] with good safety and stability    Time  3    Period  Weeks    Status  On-going    Target Date  05/27/19        PT Long Term Goals - 05/06/19 1652      PT LONG TERM GOAL #1   Title  Patient will ambualte 1000' with LRAD with good safety and without increased pain    Time  6    Period  Weeks    Status  New    Target Date  06/17/19      PT LONG TERM GOAL #2   Title  Patient will put his shoes on without pain    Time  6    Period  Weeks    Status  New    Target Date  06/17/19      PT LONG TERM GOAL #3   Title  Patient will stand for 1/2 hour without increased pain    Baseline  can not stand for more then 10 minutes    Time  6    Period  Weeks    Status  Achieved    Target Date  06/17/19            Plan - 06/02/19 1500    Clinical Impression Statement  pt reported having increased soreness in the rhomboid reaching. continued working hip/ knee and core strength. He did demonstrate apprehension with sit to stand but did better with  redirection of attention.    PT Treatment/Interventions  ADLs/Self Care Home Management;Cryotherapy;Electrical Stimulation;Traction;Moist Heat;Functional mobility training;Therapeutic activities;Therapeutic exercise;Gait training;DME Instruction;Neuromuscular re-education;Patient/family education;Manual techniques;Passive range of motion;Taping    PT Next Visit Plan  Continue general strengthening, light exercise in supine, and endurance program. Review HEP. Consider functional strengthening exercises for UE and LE.    PT Home Exercise Plan  sit to stand and standing hip flexo rstretching       Patient will benefit from skilled therapeutic intervention in order to improve the following deficits and impairments:  Abnormal gait, Decreased activity tolerance, Pain, Increased fascial restricitons, Decreased strength, Decreased range of motion, Decreased endurance, Difficulty walking  Visit Diagnosis: Chronic bilateral low back pain without sciatica  Muscle weakness (generalized)  Other abnormalities of gait and mobility  Radiculopathy, lumbar region     Problem List Patient Active Problem List   Diagnosis Date Noted  . Gastroesophageal reflux disease without esophagitis 06/19/2017  . Need for hepatitis A vaccination 06/19/2017  . 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 Routine screening for STI (sexually transmitted infection) 06/07/2016  . SVT (supraventricular tachycardia) ([place]) 10/08/2014  . Depression 10/08/2014  . Atypical nevi 06/22/2014  . Elevated triglycerides with high cholesterol 09/06/2013  . Anxiety state 08/03/2013  . Essential hypertension, benign 08/03/2013  . Environmental allergies 03/23/2013  . Visit for preventive health examination 03/23/2013  . Meniere disease 03/23/2013  . Intrinsic asthma    Starr Lake PT, DPT, LAT, ATC  06/02/19  3:03 PM      [facility] [address] [place], [place],  [address] Phone: [phone]   Fax:  [phone]  Name: Steve Jacobs MRN: [id number] Date of Birth: 1986-01-07

## 2019-06-02 NOTE — Telephone Encounter (Signed)
Lorazepam last rx 05/11/19 #30 1 RF LOV: 11/11/18 Next appt: 06/15/19  CPE CSC: 01/15/17

## 2019-06-02 NOTE — Addendum Note (Signed)
Addended by: Leonidas Romberg on: 06/02/2019 01:31 PM   Modules accepted: Orders

## 2019-06-03 ENCOUNTER — Encounter: Payer: Self-pay | Admitting: Physician Assistant

## 2019-06-03 ENCOUNTER — Ambulatory Visit: Payer: PRIVATE HEALTH INSURANCE | Attending: Orthopedic Surgery | Admitting: Physical Therapy

## 2019-06-03 ENCOUNTER — Other Ambulatory Visit: Payer: Self-pay | Admitting: Physician Assistant

## 2019-06-03 ENCOUNTER — Encounter: Payer: Self-pay | Admitting: Physical Therapy

## 2019-06-03 DIAGNOSIS — M545 Low back pain: Secondary | ICD-10-CM | POA: Insufficient documentation

## 2019-06-03 DIAGNOSIS — M6281 Muscle weakness (generalized): Secondary | ICD-10-CM | POA: Diagnosis present

## 2019-06-03 DIAGNOSIS — R2689 Other abnormalities of gait and mobility: Secondary | ICD-10-CM | POA: Insufficient documentation

## 2019-06-03 DIAGNOSIS — G8929 Other chronic pain: Secondary | ICD-10-CM | POA: Diagnosis present

## 2019-06-03 MED ORDER — LORAZEPAM 2 MG PO TABS: ORAL_TABLET | ORAL | 1 refills | Status: DC

## 2019-06-03 MED FILL — LORazepam 2 MG TABS: 2 | 10 days supply | Qty: 30 | Fill #0

## 2019-06-03 NOTE — Therapy (Signed)
Steve [place], [place], [address] Phone: [phone]   Fax:  [phone]  Physical Therapy Treatment  Patient Details  Name: Steve Jacobs MRN: [id number] Date of Birth: 1985-04-05 Referring Provider (PT): Odelia Gage [facility]    Encounter Date: 06/03/2019  PT End of Session - 06/03/19 1418    Visit Number  10    Number of Visits  13    Date for PT Re-Evaluation  06/10/19    PT Start Time  [id number]    PT Stop Time  1458    PT Time Calculation (min)  43 min    Activity Tolerance  Patient tolerated treatment well    Behavior During Therapy  [facility] for tasks assessed/performed       Past Medical History:  Diagnosis Date  . Asthma   . Chicken pox   . Depression    Resolved  . Environmental and seasonal allergies   . Essential hypertension, benign 08/03/2013  . Hyperlipidemia   . Meniere's disease   . Thoracic outlet syndrome     Past Surgical History:  Procedure Laterality Date  . Nevus Biopsy      There were no vitals filed for this visit.  Subjective Assessment - 06/03/19 1421    Subjective  " I took pain medication this morning because I was feeling more sore in the upper back/ Rhomboid region"    Currently in Pain?  Yes    Pain Score  2     Pain Location  Back    Pain Type  Chronic pain    Pain Onset  More than a month ago    Pain Frequency  Constant                        [facility] Adult PT Treatment/Exercise - 06/03/19 0001      Lumbar Exercises: Aerobic   Nustep  L5 x 6 min UE/LE      Lumbar Exercises: Seated   Sit to Stand  5 reps    Other Seated Lumbar Exercises  palloff press 1 x 10 bil with red theraband, horizontal lift/ chop with red theraband 1 x 10 ea.      Shoulder Exercises: Stretch   Other Shoulder Stretches  rhomboid stretch 2 x 30 sec hands crossed on bar and sitting back into chair      Manual Therapy   Manual Therapy  Soft tissue mobilization    Manual therapy  comments  MTPR along bil rhomboids and upper trap    Soft tissue mobilization  IASTM along the rhomboids and upper traps             PT Education - 06/03/19 1516    Education Details  reviewed HEP and updated for posterior shoulder strengthening, and how to perform self trigger point release.    Person(s) Educated  Patient    Methods  Explanation;Verbal cues;Handout    Comprehension  Verbalized understanding;Verbal cues required       PT Short Term Goals - 05/27/19 1131      PT SHORT TERM GOAL #1   Title  Patient ewill perfrom log roll without hesitancy and pain    Baseline  independent with current HEP    Time  3    Period  Weeks    Status  On-going    Target Date  05/27/19      PT SHORT TERM GOAL #2   Title  Patient will transfer sit to stand without using hands    Baseline  3/10 today    Time  3    Period  Weeks    Status  On-going    Target Date  05/27/19      PT SHORT TERM GOAL #3   Title  Patient will ambualte 54' with [facility] with good safety and stability    Time  3    Period  Weeks    Status  On-going    Target Date  05/27/19        PT Long Term Goals - 05/06/19 1652      PT LONG TERM GOAL #1   Title  Patient will ambualte 1000' with LRAD with good safety and without increased pain    Time  6    Period  Weeks    Status  New    Target Date  06/17/19      PT LONG TERM GOAL #2   Title  Patient will put his shoes on without pain    Time  6    Period  Weeks    Status  New    Target Date  06/17/19      PT LONG TERM GOAL #3   Title  Patient will stand for 1/2 hour without increased pain    Baseline  can not stand for more then 10 minutes    Time  6    Period  Weeks    Status  Achieved    Target Date  06/17/19            Plan - 06/03/19 1513    Clinical Impression Statement  pt reported feeling better but did have to take pain meds for the rhomboids. He responded well to trigger point release along the rhomboids followed with IASTM.  continued working shoulder and core in seated positiong which he did noted relieved tension.    PT Treatment/Interventions  ADLs/Self Care Home Management;Cryotherapy;Electrical Stimulation;Traction;Moist Heat;Functional mobility training;Therapeutic activities;Therapeutic exercise;Gait training;DME Instruction;Neuromuscular re-education;Patient/family education;Manual techniques;Passive range of motion;Taping    PT Next Visit Plan  Continue general strengthening, light exercise in supine, and endurance program. Review HEP. Consider functional strengthening exercises for UE and LE.    PT Home Exercise Plan  sit to stand and standing hip flexo rstretching, money, rhomboid stretching,    Consulted and Agree with Plan of Care  Patient       Patient will benefit from skilled therapeutic intervention in order to improve the following deficits and impairments:  Abnormal gait, Decreased activity tolerance, Pain, Increased fascial restricitons, Decreased strength, Decreased range of motion, Decreased endurance, Difficulty walking  Visit Diagnosis: Chronic bilateral low back pain without sciatica  Muscle weakness (generalized)  Other abnormalities of gait and mobility     Problem List Patient Active Problem List   Diagnosis Date Noted  . Gastroesophageal reflux disease without esophagitis 06/19/2017  . Need for hepatitis A vaccination 06/19/2017  . 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 Routine screening for STI (sexually transmitted infection) 06/07/2016  . SVT (supraventricular tachycardia) ([place]) 10/08/2014  . Depression 10/08/2014  . Atypical nevi 06/22/2014  . Elevated triglycerides with high cholesterol 09/06/2013  . Anxiety state 08/03/2013  . Essential hypertension, benign 08/03/2013  . Environmental allergies 03/23/2013  . Visit for preventive health examination 03/23/2013  . Meniere disease 03/23/2013  . Intrinsic asthma    Tereza Gilham PT, DPT, LAT, ATC  06/03/19  3:19  PM      [place]  [facility] [address] [place], [place], [address] Phone: [phone]   Fax:  [phone]  Name: Steve Jacobs MRN: [id number] Date of Birth: Jun 18, 1985

## 2019-06-07 ENCOUNTER — Ambulatory Visit: Payer: PRIVATE HEALTH INSURANCE | Admitting: Physical Therapy

## 2019-06-07 ENCOUNTER — Telehealth: Payer: No Typology Code available for payment source | Admitting: Family

## 2019-06-07 DIAGNOSIS — L255 Unspecified contact dermatitis due to plants, except food: Secondary | ICD-10-CM | POA: Diagnosis not present

## 2019-06-07 MED ORDER — PREDNISONE 10 MG (21) PO TBPK: ORAL_TABLET | ORAL | 0 refills | Status: DC

## 2019-06-07 MED FILL — predniSONE 10 MG TABS: 10 | 6 days supply | Qty: 21 | Fill #0

## 2019-06-07 NOTE — Progress Notes (Signed)

## 2019-06-09 ENCOUNTER — Ambulatory Visit: Payer: PRIVATE HEALTH INSURANCE | Attending: Orthopedic Surgery | Admitting: Physical Therapy

## 2019-06-09 ENCOUNTER — Other Ambulatory Visit: Payer: Self-pay

## 2019-06-09 DIAGNOSIS — M6281 Muscle weakness (generalized): Secondary | ICD-10-CM | POA: Diagnosis present

## 2019-06-09 DIAGNOSIS — M545 Low back pain: Secondary | ICD-10-CM | POA: Insufficient documentation

## 2019-06-09 DIAGNOSIS — M5416 Radiculopathy, lumbar region: Secondary | ICD-10-CM | POA: Diagnosis present

## 2019-06-09 DIAGNOSIS — R2689 Other abnormalities of gait and mobility: Secondary | ICD-10-CM | POA: Insufficient documentation

## 2019-06-09 DIAGNOSIS — G8929 Other chronic pain: Secondary | ICD-10-CM | POA: Insufficient documentation

## 2019-06-09 NOTE — Therapy (Signed)
[place], [place], [address] Phone: [phone]   Fax:  [phone]  Physical Therapy Treatment  Patient Details  Name: Steve Jacobs MRN: [id number] Date of Birth: 09/20/1985 Referring Provider (PT): Odelia Gage [facility]    Encounter Date: 06/09/2019  PT End of Session - 06/09/19 1553    Visit Number  11    Number of Visits  23    Date for PT Re-Evaluation  07/21/19    PT Start Time  9030    PT Stop Time  1630    PT Time Calculation (min)  45 min    Activity Tolerance  Patient tolerated treatment well    Behavior During Therapy  [facility] for tasks assessed/performed       Past Medical History:  Diagnosis Date  . Asthma   . Chicken pox   . Depression    Resolved  . Environmental and seasonal allergies   . Essential hypertension, benign 08/03/2013  . Hyperlipidemia   . Meniere's disease   . Thoracic outlet syndrome     Past Surgical History:  Procedure Laterality Date  . Nevus Biopsy      There were no vitals filed for this visit.  Subjective Assessment - 06/09/19 1550    Subjective  "things have been moving pretty well. I feel like things every day is getting better. my biggest issues are bending, and sitting without support and staying in one spot for long periods of time."    Patient Stated Goals  to go back to work    Currently in Pain?  Yes    Pain Score  3     Pain Descriptors / Indicators  Aching    Pain Type  Chronic pain    Pain Onset  More than a month ago    Pain Frequency  Intermittent    Aggravating Factors   bending, and sitting without support and staying in one spot for long periods of time    Pain Relieving Factors  resting, exercise/ stretching         [facility] PT Assessment - 06/09/19 0001      Assessment   Medical Diagnosis  Lumbar fusion/ lamiectomy     Referring Provider (PT)  Mark Rescino PAC     Onset Date/Surgical Date  04/22/19      Strength   Right Hip Flexion   4/5    Right Hip ABduction  4/5    Left Hip Flexion  4/5    Left Hip ABduction  4-/5      6 minute walk test results    Aerobic Endurance Distance Walked  505    Endurance additional comments  pt had to rest around 2:43 s                    [facility] Adult PT Treatment/Exercise - 06/09/19 0001      Therapeutic Activites    Therapeutic Activities  Other Therapeutic Activities    Other Therapeutic Activities  log rolling getting into/ out of bed with rocking hips laterally and reaching with contralateral UE for rolling      Lumbar Exercises: Stretches   Lower Trunk Rotation  --   1 x 20 holding 3 seconds ea at end range     Lumbar Exercises: Supine   Bent Knee Raise  10 reps   cues to keep core tight throughout session     Manual Therapy   Manual therapy comments  MTPR along bil lumbar paraspinals 1 x bil             PT Education - 06/09/19 1637    Education Details  updated HEP for lower trunk rotation, supine marching, log rolling. plan for visits moving forward.    Person(s) Educated  Patient    Methods  Explanation;Verbal cues    Comprehension  Verbalized understanding;Verbal cues required       PT Short Term Goals - 06/09/19 1553      PT SHORT TERM GOAL #1   Title  Patient ewill perfrom log roll without hesitancy and pain    Period  Weeks    Status  Achieved      PT SHORT TERM GOAL #2   Title  Patient will transfer sit to stand without using hands    Period  Weeks    Status  Achieved      PT SHORT TERM GOAL #3   Title  Patient will ambualte 42' with SPC with good safety and stability    Period  Weeks    Status  Achieved        PT Long Term Goals - 06/09/19 1555      PT LONG TERM GOAL #1   Title  Patient will ambualte 1000' with LRAD with good safety and without increased pain    Baseline  20 yards    Period  Weeks    Status  On-going      PT LONG TERM GOAL #2   Title  Patient will put his shoes on without pain    Status  On-going       PT LONG TERM GOAL #3   Title  Patient will stand for 1/2 hour without increased pain    Period  Weeks    Status  On-going      PT LONG TERM GOAL #4   Title  Pt to demo increased strength of Bil LEs and core to at least 4+/5 to improve stability and ability for return to work.    Period  Weeks      PT LONG TERM GOAL #5   Title  Pt to demo ability for proper lift, squat technique, for at least 25 lb, to improve ability for work duties.    Period  Weeks    Status  Unable to assess            Plan - 06/09/19 1638    Clinical Impression Statement  pt reports he has been consistent with his exercies and reports he feels he is making progress but still gets 3-4/10 pain the low back. He met all STG's today and is progressing appropriately toward his LTG's. worked on bed mobilty and hip / core strengthening which he responded well to. 18mn walk he was able to amb 505 ft but did require a seated break with RLE soreness. He would benefit from continued physical therapy to decrease low back pain, improve bil LE strength, increase gait efficiency/ distance and maximize function by addressing the deficits listed.    Personal Factors and Comorbidities  Comorbidity 1;Comorbidity 2    PT Frequency  2x / week    PT Duration  6 weeks    PT Treatment/Interventions  ADLs/Self Care Home Management;Cryotherapy;Electrical Stimulation;Traction;Moist Heat;Functional mobility training;Therapeutic activities;Therapeutic exercise;Gait training;DME Instruction;Neuromuscular re-education;Patient/family education;Manual techniques;Passive range of motion;Taping    PT Next Visit Plan  Continue general strengthening, light exercise in supine, and endurance program. Review HEP. Consider functional strengthening exercises for  UE and LE.    PT Home Exercise Plan  sit to stand and standing hip flexo rstretching, money, rhomboid stretching, lower trunk rotation, bend mobility, and supine marching    Consulted and Agree  with Plan of Care  Patient       Patient will benefit from skilled therapeutic intervention in order to improve the following deficits and impairments:  Abnormal gait, Decreased activity tolerance, Pain, Increased fascial restricitons, Decreased strength, Decreased range of motion, Decreased endurance, Difficulty walking  Visit Diagnosis: Chronic bilateral low back pain without sciatica  Muscle weakness (generalized)  Other abnormalities of gait and mobility  Radiculopathy, lumbar region     Problem List Patient Active Problem List   Diagnosis Date Noted  . Gastroesophageal reflux disease without esophagitis 06/19/2017  . Need for hepatitis A vaccination 06/19/2017  . 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 Routine screening for STI (sexually transmitted infection) 06/07/2016  . SVT (supraventricular tachycardia) ([place]) 10/08/2014  . Depression 10/08/2014  . Atypical nevi 06/22/2014  . Elevated triglycerides with high cholesterol 09/06/2013  . Anxiety state 08/03/2013  . Essential hypertension, benign 08/03/2013  . Environmental allergies 03/23/2013  . Visit for preventive health examination 03/23/2013  . Meniere disease 03/23/2013  . Intrinsic asthma    Starr Lake PT, DPT, LAT, ATC  06/09/19  4:51 PM      [facility] [address] [place], [place], [address] Phone: [phone]   Fax:  [phone]  Name: JAKYRON FABRO MRN: [id number] Date of Birth: April 09, 1985

## 2019-06-10 ENCOUNTER — Ambulatory Visit: Payer: PRIVATE HEALTH INSURANCE | Admitting: Physical Therapy

## 2019-06-10 ENCOUNTER — Encounter: Payer: Self-pay | Admitting: Physical Therapy

## 2019-06-10 DIAGNOSIS — R2689 Other abnormalities of gait and mobility: Secondary | ICD-10-CM

## 2019-06-10 DIAGNOSIS — G8929 Other chronic pain: Secondary | ICD-10-CM

## 2019-06-10 DIAGNOSIS — M545 Low back pain, unspecified: Secondary | ICD-10-CM

## 2019-06-10 DIAGNOSIS — M6281 Muscle weakness (generalized): Secondary | ICD-10-CM

## 2019-06-10 DIAGNOSIS — M5416 Radiculopathy, lumbar region: Secondary | ICD-10-CM

## 2019-06-10 NOTE — Therapy (Signed)
[place], [place], [address] Phone: [phone]   Fax:  [phone]  Physical Therapy Treatment  Patient Details  Name: Steve Jacobs MRN: [id number] Date of Birth: Jul 23, 1985 Referring Provider (PT): Odelia Gage [facility]    Encounter Date: 06/10/2019  PT End of Session - 06/10/19 1543    Visit Number  12    Number of Visits  23    Date for PT Re-Evaluation  07/21/19    PT Start Time  [id number]    PT Stop Time  1626    PT Time Calculation (min)  43 min    Activity Tolerance  Patient tolerated treatment well    Behavior During Therapy  [facility] for tasks assessed/performed       Past Medical History:  Diagnosis Date  . Asthma   . Chicken pox   . Depression    Resolved  . Environmental and seasonal allergies   . Essential hypertension, benign 08/03/2013  . Hyperlipidemia   . Meniere's disease   . Thoracic outlet syndrome     Past Surgical History:  Procedure Laterality Date  . Nevus Biopsy      There were no vitals filed for this visit.  Subjective Assessment - 06/10/19 1544    Subjective  "I have been able to sleep throughout the night since the last few days. I had to start taking prednisone since my L ankle has poison ivy. I feel like I am getting better."    Patient Stated Goals  to go back to work    Currently in Pain?  Yes    Pain Score  3     Pain Location  Back    Pain Orientation  Right;Left    Pain Descriptors / Indicators  Aching    Pain Type  Chronic pain    Pain Onset  More than a month ago         [facility] PT Assessment - 06/10/19 0001      Assessment   Medical Diagnosis  Lumbar fusion/ lamiectomy     Referring Provider (PT)  Elta Gu[place] Rescino PAC     Onset Date/Surgical Date  04/22/19                    [facility] Adult PT Treatment/Exercise - 06/10/19 0001      Lumbar Exercises: Stretches   Lower Trunk Rotation  5 reps;10 seconds    Piriformis Stretch  1 rep;Left;Right;30  seconds      Lumbar Exercises: Aerobic   Nustep  L5 x 7 min UE/LE   gradually increasing time for endurnace training     Lumbar Exercises: Seated   Other Seated Lumbar Exercises  seated on dyna disc marching 1 x 20 alternating L/R    Other Seated Lumbar Exercises  seated on dyna disc, horizontal abduction 1 x 10 , rows and  unweighted over head press 2 x 10      Lumbar Exercises: Supine   Pelvic Tilt  10 reps;5 seconds;1 rep    Bent Knee Raise  10 reps   while maintaining core activation   Bent Knee Raise Limitations  lifting contrallateral limb before the ipsilateral touches the table               PT Short Term Goals - 06/09/19 1553      PT SHORT TERM GOAL #1   Title  Patient ewill perfrom log roll without hesitancy and pain  Period  Weeks    Status  Achieved      PT SHORT TERM GOAL #2   Title  Patient will transfer sit to stand without using hands    Period  Weeks    Status  Achieved      PT SHORT TERM GOAL #3   Title  Patient will ambualte 58' with SPC with good safety and stability    Period  Weeks    Status  Achieved        PT Long Term Goals - 06/09/19 1555      PT LONG TERM GOAL #1   Title  Patient will ambualte 1000' with LRAD with good safety and without increased pain    Baseline  20 yards    Period  Weeks    Status  On-going      PT LONG TERM GOAL #2   Title  Patient will put his shoes on without pain    Status  On-going      PT LONG TERM GOAL #3   Title  Patient will stand for 1/2 hour without increased pain    Period  Weeks    Status  On-going      PT LONG TERM GOAL #4   Title  Pt to demo increased strength of Bil LEs and core to at least 4+/5 to improve stability and ability for return to work.    Period  Weeks      PT LONG TERM GOAL #5   Title  Pt to demo ability for proper lift, squat technique, for at least 25 lb, to improve ability for work duties.    Period  Weeks    Status  Unable to assess            Plan -  06/10/19 1618    Clinical Impression Statement  pt reports he is making progress. focused session on core and shoulder strengthening today which he responded very well to. Utilized dyna disc combined with posterior shoulder strengthening to maximize core activation.    PT Treatment/Interventions  ADLs/Self Care Home Management;Cryotherapy;Electrical Stimulation;Traction;Moist Heat;Functional mobility training;Therapeutic activities;Therapeutic exercise;Gait training;DME Instruction;Neuromuscular re-education;Patient/family education;Manual techniques;Passive range of motion;Taping    PT Next Visit Plan  Continue general strengthening, light exercise in supine, and endurance program. Review HEP. Consider functional strengthening exercises for UE and LE.    PT Home Exercise Plan  sit to stand and standing hip flexo rstretching, money, rhomboid stretching, lower trunk rotation, bend mobility, and supine marching    Consulted and Agree with Plan of Care  Patient       Patient will benefit from skilled therapeutic intervention in order to improve the following deficits and impairments:  Abnormal gait, Decreased activity tolerance, Pain, Increased fascial restricitons, Decreased strength, Decreased range of motion, Decreased endurance, Difficulty walking  Visit Diagnosis: Chronic bilateral low back pain without sciatica  Muscle weakness (generalized)  Other abnormalities of gait and mobility  Radiculopathy, lumbar region     Problem List Patient Active Problem List   Diagnosis Date Noted  . Gastroesophageal reflux disease without esophagitis 06/19/2017  . Need for hepatitis A vaccination 06/19/2017  . 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 Routine screening for STI (sexually transmitted infection) 06/07/2016  . SVT (supraventricular tachycardia) ([place]) 10/08/2014  . Depression 10/08/2014  . Atypical nevi 06/22/2014  . Elevated triglycerides with high cholesterol 09/06/2013  . Anxiety state  08/03/2013  . Essential hypertension, benign 08/03/2013  . Environmental allergies 03/23/2013  . Visit for preventive  health examination 03/23/2013  . Meniere disease 03/23/2013  . Intrinsic asthma    Starr Lake PT, DPT, LAT, ATC  06/10/19  4:26 PM      [place] [address] [place], [place], [address] Phone: [phone]   Fax:  [phone]  Name: Steve Jacobs MRN: [id number] Date of Birth: 11-13-1985

## 2019-06-15 ENCOUNTER — Ambulatory Visit (INDEPENDENT_AMBULATORY_CARE_PROVIDER_SITE_OTHER): Payer: No Typology Code available for payment source | Admitting: Physician Assistant

## 2019-06-15 ENCOUNTER — Ambulatory Visit: Payer: PRIVATE HEALTH INSURANCE | Attending: Physician Assistant | Admitting: Physical Therapy

## 2019-06-15 ENCOUNTER — Other Ambulatory Visit: Payer: Self-pay

## 2019-06-15 ENCOUNTER — Encounter: Payer: No Typology Code available for payment source | Admitting: Physician Assistant

## 2019-06-15 ENCOUNTER — Encounter: Payer: Self-pay | Admitting: Physician Assistant

## 2019-06-15 VITALS — BP 129/82 | HR 81 | Temp 98.0°F | Resp 17 | Ht 68.0 in | Wt 222.0 lb

## 2019-06-15 DIAGNOSIS — G8929 Other chronic pain: Secondary | ICD-10-CM | POA: Diagnosis present

## 2019-06-15 DIAGNOSIS — M545 Low back pain, unspecified: Secondary | ICD-10-CM

## 2019-06-15 DIAGNOSIS — R2689 Other abnormalities of gait and mobility: Secondary | ICD-10-CM | POA: Insufficient documentation

## 2019-06-15 DIAGNOSIS — Z0000 Encounter for general adult medical examination without abnormal findings: Secondary | ICD-10-CM

## 2019-06-15 DIAGNOSIS — I10 Essential (primary) hypertension: Secondary | ICD-10-CM | POA: Diagnosis not present

## 2019-06-15 DIAGNOSIS — M6281 Muscle weakness (generalized): Secondary | ICD-10-CM | POA: Insufficient documentation

## 2019-06-15 DIAGNOSIS — M5416 Radiculopathy, lumbar region: Secondary | ICD-10-CM | POA: Insufficient documentation

## 2019-06-15 DIAGNOSIS — H8109 Meniere's disease, unspecified ear: Secondary | ICD-10-CM | POA: Diagnosis not present

## 2019-06-15 LAB — COMPREHENSIVE METABOLIC PANEL
ALT: 19 U/L (ref 0–53)
AST: 16 U/L (ref 0–37)
Albumin: 5.1 g/dL (ref 3.5–5.2)
Alkaline Phosphatase: 62 U/L (ref 39–117)
BUN: 14 mg/dL (ref 6–23)
CO2: 30 mEq/L (ref 19–32)
Calcium: 10.1 mg/dL (ref 8.4–10.5)
Chloride: 96 mEq/L (ref 96–112)
Creatinine, Ser: 0.84 mg/dL (ref 0.40–1.50)
GFR: 104.66 mL/min (ref >60.00)
Glucose, Bld: 91 mg/dL (ref 70–99)
Potassium: 4.3 mEq/L (ref 3.5–5.1)
Sodium: 136 mEq/L (ref 135–145)
Total Bilirubin: 0.6 mg/dL (ref 0.2–1.2)
Total Protein: 7.6 g/dL (ref 6.0–8.3)

## 2019-06-15 LAB — LIPID PANEL
Cholesterol: 170 mg/dL (ref 0–200)
HDL: 42.1 mg/dL (ref >39.00)
Total CHOL/HDL Ratio: 4
Triglycerides: 402 mg/dL — ABNORMAL HIGH (ref 0.0–149.0)

## 2019-06-15 LAB — CBC WITH DIFFERENTIAL/PLATELET
Basophils Absolute: 0.1 10*3/uL (ref 0.0–0.1)
Basophils Relative: 2.8 % (ref 0.0–3.0)
Eosinophils Absolute: 0.1 10*3/uL (ref 0.0–0.7)
Eosinophils Relative: 2.8 % (ref 0.0–5.0)
HCT: 45.2 % (ref 39.0–52.0)
Hemoglobin: 15.1 g/dL (ref 13.0–17.0)
Lymphocytes Relative: 48.5 % — ABNORMAL HIGH (ref 12.0–46.0)
Lymphs Abs: 2.1 10*3/uL (ref 0.7–4.0)
MCHC: 33.3 g/dL (ref 30.0–36.0)
MCV: 81.9 fl (ref 78.0–100.0)
Monocytes Absolute: 0.4 10*3/uL (ref 0.1–1.0)
Monocytes Relative: 9.1 % (ref 3.0–12.0)
Neutro Abs: 1.6 10*3/uL (ref 1.4–7.7)
Neutrophils Relative %: 36.8 % — ABNORMAL LOW (ref 43.0–77.0)
Platelets: 302 10*3/uL (ref 150.0–400.0)
RBC: 5.52 Mil/uL (ref 4.22–5.81)
RDW: 14.3 % (ref 11.5–15.5)
WBC: 4.3 10*3/uL (ref 4.0–10.5)

## 2019-06-15 LAB — HEMOGLOBIN A1C: Hgb A1c MFr Bld: 5.1 % (ref 4.6–6.5)

## 2019-06-15 LAB — LDL CHOLESTEROL, DIRECT: Direct LDL: 66 mg/dL

## 2019-06-15 MED FILL — PREGABALIN 150 MG CAPS: 150 | 20 days supply | Qty: 40 | Fill #0

## 2019-06-15 MED FILL — LORazepam 2 MG TABS: 2 | 10 days supply | Qty: 30 | Fill #1

## 2019-06-15 NOTE — Progress Notes (Signed)
Patient presents to clinic today for annual exam.  Patient is fasting for labs.  Health Maintenance: Immunizations -- UTD  Past Medical History:  Diagnosis Date  . Asthma   . Chicken pox   . Depression    Resolved  . Environmental and seasonal allergies   . Essential hypertension, benign 08/03/2013  . Hyperlipidemia   . Meniere's disease   . Thoracic outlet syndrome     Past Surgical History:  Procedure Laterality Date  . Nevus Biopsy      Current Outpatient Medications on File Prior to Visit  Medication Sig Dispense Refill  . acetaminophen (TYLENOL 8 HOUR) 650 MG CR tablet Take 1 tablet (650 mg total) by mouth every 8 (eight) hours as needed for pain or fever. 30 tablet 0  . albuterol (VENTOLIN HFA) 108 (90 Base) MCG/ACT inhaler INHALE 2 PUFFS BY MOUTH EVERY 6 HOURS AS NEEDED FOR WHEEZING 54 g 1  . atorvastatin (LIPITOR) 20 MG tablet TAKE 1 TABLET (20 MG TOTAL) BY MOUTH DAILY. 90 tablet 0  . budesonide-formoterol (SYMBICORT) 80-4.5 MCG/ACT inhaler Inhale 2 puffs into the lungs 2 (two) times daily. 1 Inhaler 6  . cyclobenzaprine (FLEXERIL) 10 MG tablet Take 1 tablet (10 mg total) by mouth 2 (two) times daily as needed for muscle spasms. 20 tablet 0  . diphenhydrAMINE (BENADRYL) 25 MG tablet Take 25 mg by mouth every 6 (six) hours as needed for itching or allergies.    Marland Kitchen escitalopram (LEXAPRO) 20 MG tablet TAKE 1 TABLET (20 MG TOTAL) BY MOUTH DAILY. 90 tablet 1  . fenofibrate (TRICOR) 145 MG tablet TAKE 1 TABLET (145 MG TOTAL) BY MOUTH DAILY. 90 tablet 0  . finasteride (PROPECIA) 1 MG tablet TAKE 1 TABLET (1 MG TOTAL) BY MOUTH DAILY. 90 tablet 0  . fluticasone (FLONASE) 50 MCG/ACT nasal spray Place 2 sprays into both nostrils daily. 16 g 3  . ibuprofen (ADVIL) 600 MG tablet Take 1 tablet (600 mg total) by mouth every 6 (six) hours as needed. 30 tablet 0  . levocetirizine (XYZAL) 5 MG tablet TAKE 1 TABLET BY MOUTH EVERY EVENING. 30 tablet 3  . LORazepam (ATIVAN) 2 MG tablet  TAKE 1 TABLET (2 MG TOTAL) BY MOUTH EVERY 8 HOURS AS NEEDED FOR ANXIETY 30 tablet 1  . methocarbamol (ROBAXIN) 750 MG tablet Take 750 mg by mouth 4 (four) times daily.    . metoprolol succinate (TOPROL-XL) 50 MG 24 hr tablet TAKE 1 TABLET (50 MG TOTAL) BY MOUTH DAILY. 90 tablet 0  . montelukast (SINGULAIR) 10 MG tablet TAKE 1 TABLET (10 MG TOTAL) BY MOUTH AT BEDTIME. 90 tablet 1  . Multiple Vitamin (MULTIVITAMIN WITH MINERALS) TABS tablet Take 1 tablet by mouth daily.    Marland Kitchen omeprazole (PRILOSEC) 20 MG capsule TAKE 1 CAPSULE (20 MG TOTAL) BY MOUTH DAILY. 90 capsule 1  . predniSONE (STERAPRED UNI-PAK 21 TAB) 10 MG (21) TBPK tablet Use as directed 21 tablet 0  . promethazine (PHENERGAN) 25 MG tablet TAKE 1 TABLET BY MOUTH EVERY 8 HOURS AS NEEDED FOR NAUSEA OR VOMITING 20 tablet 0  . thiamine (VITAMIN B-1) 100 MG tablet Take 100 mg by mouth daily.    . traMADol (ULTRAM) 50 MG tablet Take 1 tablet by mouth 2 (two) times daily.    Marland Kitchen triamterene-hydrochlorothiazide (DYAZIDE) 37.5-25 MG capsule TAKE 1 CAPSULE BY MOUTH DAILY. 90 capsule 1  . VASCEPA 1 g CAPS TAKE 2 CAPSULES BY MOUTH TWICE A DAY 360 capsule 1  .  zolpidem (AMBIEN) 5 MG tablet TAKE 1 TABLET (5 MG TOTAL) BY MOUTH AT BEDTIME AS NEEDED FOR SLEEP 30 tablet 0   No current facility-administered medications on file prior to visit.    Allergies  Allergen Reactions  . Augmentin [Amoxicillin-Pot Clavulanate]   . Belsomra [Suvorexant] Other (See Comments)    nightmares  . Percocet [Oxycodone-Acetaminophen] Nausea And Vomiting    Family History  Problem Relation Age of Onset  . Hyperlipidemia Father        Living  . Stroke Father   . Hypertension Father   . Alcohol abuse Mother        Living  . Diabetes Mellitus II Maternal Grandfather   . Hypertension Maternal Grandfather   . Heart failure Maternal Grandfather   . Kidney disease Maternal Grandfather   . Heart disease Maternal Grandmother   . Melanoma Paternal Grandmother   . Heart  attack Paternal Grandfather   . Migraines Brother   . Healthy Brother        x2  . Drug abuse Sister        Died of Overdose    Social History   Socioeconomic History  . Marital status: Single    Spouse name: Not on file  . Number of children: Not on file  . Years of education: Not on file  . Highest education level: Not on file  Occupational History  . Not on file  Tobacco Use  . Smoking status: Never Smoker  . Smokeless tobacco: Never Used  Substance and Sexual Activity  . Alcohol use: Yes    Alcohol/week: 0.0 standard drinks    Comment: 2-3 beers nightly  . Drug use: No  . Sexual activity: Yes    Birth control/protection: None    Comment: male - 1 partner  Other Topics Concern  . Not on file  Social History Narrative  . Not on file   Social Determinants of Health   Financial Resource Strain:   . Difficulty of Paying Living Expenses:   Food Insecurity:   . Worried About Charity fundraiser in the Last Year:   . Arboriculturist in the Last Year:   Transportation Needs:   . Film/video editor (Medical):   Marland Kitchen Lack of Transportation (Non-Medical):   Physical Activity:   . Days of Exercise per Week:   . Minutes of Exercise per Session:   Stress:   . Feeling of Stress :   Social Connections:   . Frequency of Communication with Friends and Family:   . Frequency of Social Gatherings with Friends and Family:   . Attends Religious Services:   . Active Member of Clubs or Organizations:   . Attends Archivist Meetings:   Marland Kitchen Marital Status:   Intimate Partner Violence:   . Fear of Current or Ex-Partner:   . Emotionally Abused:   Marland Kitchen Physically Abused:   . Sexually Abused:    Review of Systems  Constitutional: Negative for fever and weight loss.  HENT: Negative for ear discharge, ear pain, hearing loss and tinnitus.   Eyes: Negative for blurred vision, double vision, photophobia and pain.  Respiratory: Negative for cough and shortness of breath.    Cardiovascular: Negative for chest pain and palpitations.  Gastrointestinal: Negative for abdominal pain, blood in stool, constipation, diarrhea, heartburn, melena, nausea and vomiting.  Genitourinary: Negative for dysuria, flank pain, frequency, hematuria and urgency.  Musculoskeletal: Positive for back pain (recent surgery). Negative for falls.  Neurological: Negative  for dizziness, loss of consciousness and headaches.  Endo/Heme/Allergies: Negative for environmental allergies.  Psychiatric/Behavioral: Negative for depression, hallucinations, substance abuse and suicidal ideas. The patient is not nervous/anxious and does not have insomnia.     There were no vitals taken for this visit.  Physical Exam Vitals reviewed.  Constitutional:      General: He is not in acute distress.    Appearance: He is well-developed. He is not diaphoretic.  HENT:     Head: Normocephalic and atraumatic.     Right Ear: Tympanic membrane, ear canal and external ear normal.     Left Ear: Tympanic membrane, ear canal and external ear normal.     Nose: Nose normal.     Mouth/Throat:     Pharynx: No posterior oropharyngeal erythema.  Eyes:     Conjunctiva/sclera: Conjunctivae normal.     Pupils: Pupils are equal, round, and reactive to light.  Neck:     Thyroid: No thyromegaly.  Cardiovascular:     Rate and Rhythm: Normal rate and regular rhythm.     Heart sounds: Normal heart sounds.  Pulmonary:     Effort: Pulmonary effort is normal. No respiratory distress.     Breath sounds: Normal breath sounds. No wheezing or rales.  Chest:     Chest wall: No tenderness.  Abdominal:     General: Bowel sounds are normal. There is no distension.     Palpations: Abdomen is soft. There is no mass.     Tenderness: There is no abdominal tenderness. There is no guarding or rebound.  Musculoskeletal:     Cervical back: Neck supple.  Lymphadenopathy:     Cervical: No cervical adenopathy.  Skin:    General: Skin is  warm and dry.     Findings: No rash.  Neurological:     Mental Status: He is alert and oriented to person, place, and time.     Cranial Nerves: No cranial nerve deficit.    Assessment/Plan: 1. Visit for preventive health examination Depression screen negative. Health Maintenance reviewed. Preventive schedule discussed and handout given in AVS. Will obtain fasting labs today.  - CBC with Differential/Platelet - Comprehensive metabolic panel - Hemoglobin A1c - Lipid panel - HIV Antibody (routine testing w rflx)  2. Essential hypertension, benign BP normotensive. Asymptomatic. Dietary and exercise recommendations reviewed. Repeat fasting labs today. - Comprehensive metabolic panel - Hemoglobin A1c - Lipid panel  3. Meniere's disease, unspecified laterality Stable. Continue current regimen.   This visit occurred during the SARS-CoV-2 public health emergency.  Safety protocols were in place, including screening questions prior to the visit, additional usage of staff PPE, and extensive cleaning of exam room while observing appropriate contact time as indicated for disinfecting solutions.     Leeanne Rio, PA-C

## 2019-06-15 NOTE — Patient Instructions (Signed)
Please go to the lab for blood work.   Our office will call you with your results unless you have chosen to receive results via MyChart.  If your blood work is normal we will follow-up each year for physicals and as scheduled for chronic medical problems.  If anything is abnormal we will treat accordingly and get you in for a follow-up.  Please work on mindfulness training -- try to download the Calm or Headspace App to work on this.  Please let me know if you change your mind regarding depression medications.  Follow-up with specialist as scheduled.    Preventive Care 52-72 Years Old, Male Preventive care refers to lifestyle choices and visits with your health care provider that can promote health and wellness. This includes:  A yearly physical exam. This is also called an annual well check.  Regular dental and eye exams.  Immunizations.  Screening for certain conditions.  Healthy lifestyle choices, such as eating a healthy diet, getting regular exercise, not using drugs or products that contain nicotine and tobacco, and limiting alcohol use. What can I expect for my preventive care visit? Physical exam Your health care provider will check:  Height and weight. These may be used to calculate body mass index (BMI), which is a measurement that tells if you are at a healthy weight.  Heart rate and blood pressure.  Your skin for abnormal spots. Counseling Your health care provider may ask you questions about:  Alcohol, tobacco, and drug use.  Emotional well-being.  Home and relationship well-being.  Sexual activity.  Eating habits.  Work and work Statistician. What immunizations do I need?  Influenza (flu) vaccine  This is recommended every year. Tetanus, diphtheria, and pertussis (Tdap) vaccine  You may need a Td booster every 10 years. Varicella (chickenpox) vaccine  You may need this vaccine if you have not already been vaccinated. Human papillomavirus (HPV)  vaccine  If recommended by your health care provider, you may need three doses over 6 months. Measles, mumps, and rubella (MMR) vaccine  You may need at least one dose of MMR. You may also need a second dose. Meningococcal conjugate (MenACWY) vaccine  One dose is recommended if you are 32-66 years old and a Market researcher living in a residence hall, or if you have one of several medical conditions. You may also need additional booster doses. Pneumococcal conjugate (PCV13) vaccine  You may need this if you have certain conditions and were not previously vaccinated. Pneumococcal polysaccharide (PPSV23) vaccine  You may need one or two doses if you smoke cigarettes or if you have certain conditions. Hepatitis A vaccine  You may need this if you have certain conditions or if you travel or work in places where you may be exposed to hepatitis A. Hepatitis B vaccine  You may need this if you have certain conditions or if you travel or work in places where you may be exposed to hepatitis B. Haemophilus influenzae type b (Hib) vaccine  You may need this if you have certain risk factors. You may receive vaccines as individual doses or as more than one vaccine together in one shot (combination vaccines). Talk with your health care provider about the risks and benefits of combination vaccines. What tests do I need? Blood tests  Lipid and cholesterol levels. These may be checked every 5 years starting at age 34.  Hepatitis C test.  Hepatitis B test. Screening   Diabetes screening. This is done by checking your blood  sugar (glucose) after you have not eaten for a while (fasting).  Sexually transmitted disease (STD) testing. Talk with your health care provider about your test results, treatment options, and if necessary, the need for more tests. Follow these instructions at home: Eating and drinking   Eat a diet that includes fresh fruits and vegetables, whole grains, lean  protein, and low-fat dairy products.  Take vitamin and mineral supplements as recommended by your health care provider.  Do not drink alcohol if your health care provider tells you not to drink.  If you drink alcohol: ? Limit how much you have to 0-2 drinks a day. ? Be aware of how much alcohol is in your drink. In the [place] one drink equals one 12 oz bottle of beer (355 mL), one 5 oz glass of wine (148 mL), or one 1 oz glass of hard liquor (44 mL). Lifestyle  Take daily care of your teeth and gums.  Stay active. Exercise for at least 30 minutes on 5 or more days each week.  Do not use any products that contain nicotine or tobacco, such as cigarettes, e-cigarettes, and chewing tobacco. If you need help quitting, ask your health care provider.  If you are sexually active, practice safe sex. Use a condom or other form of protection to prevent STIs (sexually transmitted infections). What's next?  Go to your health care provider once a year for a well check visit.  Ask your health care provider how often you should have your eyes and teeth checked.  Stay up to date on all vaccines. This information is not intended to replace advice given to you by your health care provider. Make sure you discuss any questions you have with your health care provider. Document Revised: 12/25/2017 Document Reviewed: 12/25/2017 Elsevier Patient Education  2020 [employer].

## 2019-06-16 ENCOUNTER — Encounter: Payer: Self-pay | Admitting: Physical Therapy

## 2019-06-16 LAB — HIV ANTIBODY (ROUTINE TESTING W REFLEX): HIV 1&2 Ab, 4th Generation: NONREACTIVE

## 2019-06-16 MED FILL — METHOCARBAMOL 750 MG TABS: 750 | 13 days supply | Qty: 40 | Fill #0

## 2019-06-16 MED FILL — HYDROCODON-APAP 5-325: 5-325 | 7 days supply | Qty: 42 | Fill #0

## 2019-06-16 NOTE — Therapy (Signed)
[place], [place], [address] Phone: [phone]   Fax:  [phone]  Physical Therapy Treatment/Progress note   Patient Details  Name: Steve Jacobs MRN: [id number] Date of Birth: February 19, 1985 Referring Provider (PT): Odelia Gage [facility]    Encounter Date: 06/15/2019  PT End of Session - 06/15/19 1635    Visit Number  13    Number of Visits  23    Date for PT Re-Evaluation  07/28/19    PT Start Time  [id number]   Patient 10 minutes late   PT Stop Time  1635    PT Time Calculation (min)  40 min    Activity Tolerance  Patient tolerated treatment well    Behavior During Therapy  [name] for tasks assessed/performed       Past Medical History:  Diagnosis Date  . Asthma   . Chicken pox   . Depression    Resolved  . Environmental and seasonal allergies   . Essential hypertension, benign 08/03/2013  . Hyperlipidemia   . Meniere's disease   . Thoracic outlet syndrome     Past Surgical History:  Procedure Laterality Date  . Nevus Biopsy      There were no vitals filed for this visit.  Subjective Assessment - 06/16/19 1319    Subjective  Patient reports he was able to walk and do some activity over the weekend without significant pain. his pain level is about a 21/0 today on the left. He continues to work on his ther-ex.    Limitations  Standing;Walking    How long can you stand comfortably?  As the day goes on he has more difficulty standing    How long can you walk comfortably?  limited didstances    Diagnostic tests  Psot-op solid fusion    Patient Stated Goals  to go back to work    Currently in Pain?  Yes    Pain Score  2     Pain Location  Back    Pain Orientation  Left    Pain Descriptors / Indicators  Aching    Pain Type  Chronic pain    Pain Radiating Towards  radiates into the riht buttock    Pain Onset  More than a month ago    Pain Frequency  Intermittent    Aggravating Factors   bending,  sitting for long periods of time    Pain Relieving Factors  resting, exercises, stretching         [facility] PT Assessment - 06/16/19 0001      Assessment   Medical Diagnosis  Lumbar fusion/ lamiectomy     Referring Provider (PT)  Mark Rescino PAC     Onset Date/Surgical Date  04/22/19      AROM   Overall AROM Comments  not formally sassed but patient is beggining to bend at the waist more with treatment. He continues to wear the brace which limits formal assessment but we will when advised by the MD.       Strength   Right Hip Flexion  4+/5    Right Hip ABduction  4/5    Right Hip ADduction  4+/5    Left Hip ABduction  4/5    Left Hip ADduction  4/5      Transfers   Comments  Improved log  roll to and from supine. Significant improvement in speed and stability with sit to stand transfer.  Ambulation/Gait   Gait Comments  Not using assisive device. walking with improved hip rotation and with improved left single leg stance.       6 minute walk test results    Aerobic Endurance Distance Walked  505    Endurance additional comments  pt had to rest around 2:43 s                    [facility] Adult PT Treatment/Exercise - 06/16/19 0001      Lumbar Exercises: Stretches   Lower Trunk Rotation Limitations  x15 bilateral    Piriformis Stretch  1 rep;Left;Right;30 seconds      Lumbar Exercises: Aerobic   Nustep  L5 x 7 min UE/LE   gradually increasing time for endurnace training     Lumbar Exercises: Standing   Heel Raises Limitations  2x10     Row Limitations  3x10 red      Shoulder Extension Limitations  3x10 red     Other Standing Lumbar Exercises  standing marching alternating L/R with bil UE HHA 2 x 15, lateral band walks 2 x 15    Other Standing Lumbar Exercises  pallof press 2x10 reach direction; low range chop 2x10 red       Lumbar Exercises: Seated   Other Seated Lumbar Exercises  dyna disc LAQ 2x10; Seated clamshell ondynadisc 2x10       Lumbar  Exercises: Supine   Pelvic Tilt  10 reps;5 seconds;1 rep    Bent Knee Raise  10 reps   while maintaining core activation   Bent Knee Raise Limitations  lifting contrallateral limb before the ipsilateral touches the table             PT Education - 06/16/19 1324    Education Details  progression of activity    Person(s) Educated  Patient    Methods  Explanation;Demonstration;Tactile cues;Verbal cues    Comprehension  Verbalized understanding;Verbal cues required;Returned demonstration;Tactile cues required       PT Short Term Goals - 06/16/19 1436      PT SHORT TERM GOAL #1   Title  Patient will perfrom log roll without hesitancy and pain    Baseline  perfroming log roll without pain    Time  3    Period  Weeks    Status  Achieved    Target Date  05/27/19      PT SHORT TERM GOAL #2   Title  Patient will transfer sit to stand without using hands    Baseline  able to tranfer 10x without hands    Time  3    Period  Weeks    Status  Achieved    Target Date  05/27/19      PT SHORT TERM GOAL #3   Title  Patient will ambualte 300' with [facility] with good safety and stability    Baseline  No device    Time  3    Period  Weeks    Status  Achieved    Target Date  05/27/19        PT Long Term Goals - 06/16/19 1437      PT LONG TERM GOAL #1   Title  Patient will ambualte 1000' with LRAD with good safety and without increased pain    Baseline  ambualted 505' in 6 minutes without device    Time  6    Period  Weeks    Status  On-going  PT LONG TERM GOAL #2   Title  Patient will put his shoes on without pain    Baseline  is bending more but still using slip pn shoes    Time  6    Period  Weeks    Status  On-going      PT LONG TERM GOAL #3   Title  Patient will stand for 1/2 hour without increased pain    Baseline  can not stand for more then 15  minutes    Time  6    Period  Weeks    Status  On-going      PT LONG TERM GOAL #4   Title  Pt to demo increased  strength of Bil LEs and core to at least 4+/5 to improve stability and ability for return to work.    Baseline  Strength has improved with all movements    Time  6    Period  Weeks    Status  On-going            Plan - 06/16/19 1326    Clinical Impression Statement  Patient continues to tolerate treatment well. he was able to perfrom ther-ex in sitting, standing, and supine positioning without pain. He had minor pain with pallof press. he was able to tolerate all other strengthening. Therapy will continue to progress patient into functional activity such as squats and steps. He is walking without and assistve device. He feels like he is having less trouble bending forward but is still limited putting his shoes on. He would benefit from further therapy 2W6 to continue to advance endurance and advance his a bility to perfrom work activity.    Personal Factors and Comorbidities  Comorbidity 1;Comorbidity 2    Comorbidities  long standing low back pain, anxiety, depression    Examination-Activity Limitations  Bed Mobility;Squat;Lift;Carry;Reach Overhead;Sit    Examination-Participation Restrictions  Shop;Community Activity;Cleaning;Laundry    Stability/Clinical Decision Making  Evolving/Moderate complexity    Clinical Decision Making  Moderate    Rehab Potential  Good    PT Frequency  2x / week    PT Duration  6 weeks    PT Treatment/Interventions  ADLs/Self Care Home Management;Cryotherapy;Electrical Stimulation;Traction;Moist Heat;Functional mobility training;Therapeutic activities;Therapeutic exercise;Gait training;DME Instruction;Neuromuscular re-education;Patient/family education;Manual techniques;Passive range of motion;Taping    PT Next Visit Plan  Continue general strengthening, light exercise in supine, and endurance program. Review HEP. Consider functional strengthening exercises for UE and LE.    PT Home Exercise Plan  row, shoulder extension, standing march, standing weight shift;  seated bilateral er; seated horizontal abduction;    Consulted and Agree with Plan of Care  Patient       Patient will benefit from skilled therapeutic intervention in order to improve the following deficits and impairments:  Abnormal gait, Decreased activity tolerance, Pain, Increased fascial restricitons, Decreased strength, Decreased range of motion, Decreased endurance, Difficulty walking  Visit Diagnosis: Chronic bilateral low back pain without sciatica  Muscle weakness (generalized)  Other abnormalities of gait and mobility  Radiculopathy, lumbar region     Problem List Patient Active Problem List   Diagnosis Date Noted  . Gastroesophageal reflux disease without esophagitis 06/19/2017  . Need for hepatitis A vaccination 06/19/2017  . 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 Routine screening for STI (sexually transmitted infection) 06/07/2016  . SVT (supraventricular tachycardia) ([place]) 10/08/2014  . Depression 10/08/2014  . Atypical nevi 06/22/2014  . Elevated triglycerides with high cholesterol 09/06/2013  . Anxiety state 08/03/2013  .  Essential hypertension, benign 08/03/2013  . Environmental allergies 03/23/2013  . Visit for preventive health examination 03/23/2013  . Meniere disease 03/23/2013  . Intrinsic asthma     Carney Living PT DPT 06/16/2019, 2:39 PM  [facility] [address] [place], [place], [address] Phone: [phone]   Fax:  [phone]  Name: Steve Jacobs MRN: [id number] Date of Birth: 1985/04/25

## 2019-06-17 ENCOUNTER — Other Ambulatory Visit: Payer: Self-pay

## 2019-06-17 ENCOUNTER — Ambulatory Visit: Payer: PRIVATE HEALTH INSURANCE | Attending: Physician Assistant | Admitting: Physical Therapy

## 2019-06-17 DIAGNOSIS — M6281 Muscle weakness (generalized): Secondary | ICD-10-CM | POA: Diagnosis present

## 2019-06-17 DIAGNOSIS — M545 Low back pain: Secondary | ICD-10-CM | POA: Insufficient documentation

## 2019-06-17 DIAGNOSIS — G8929 Other chronic pain: Secondary | ICD-10-CM | POA: Insufficient documentation

## 2019-06-17 DIAGNOSIS — R2689 Other abnormalities of gait and mobility: Secondary | ICD-10-CM | POA: Insufficient documentation

## 2019-06-17 DIAGNOSIS — M5416 Radiculopathy, lumbar region: Secondary | ICD-10-CM | POA: Insufficient documentation

## 2019-06-18 NOTE — Therapy (Signed)
[place], [place], [address] Phone: [phone]   Fax:  [phone]  Physical Therapy Treatment  Patient Details  Name: Steve Jacobs MRN: [id number] Date of Birth: 1985/03/14 Referring Provider (PT): Odelia Gage [facility]    Encounter Date: 06/17/2019  PT End of Session - 06/17/19 1601    Visit Number  14    Number of Visits  23    Date for PT Re-Evaluation  07/28/19    Authorization Type  spoke with comp doc    PT Start Time  1545    PT Stop Time  1624    PT Time Calculation (min)  39 min    Activity Tolerance  Patient tolerated treatment well    Behavior During Therapy  [facility] for tasks assessed/performed       Past Medical History:  Diagnosis Date  . Asthma   . Chicken pox   . Depression    Resolved  . Environmental and seasonal allergies   . Essential hypertension, benign 08/03/2013  . Hyperlipidemia   . Meniere's disease   . Thoracic outlet syndrome     Past Surgical History:  Procedure Laterality Date  . Nevus Biopsy      There were no vitals filed for this visit.                     [facility] Adult PT Treatment/Exercise - 06/18/19 0001      Lumbar Exercises: Seated   Other Seated Lumbar Exercises  no dyna disc today. LAQ x20 bilateral red, hip abduction x20 red; hamstring curl x20 red; Seated bilateral ER red x20; horizontal abduction x20 red; felxion with abduction red x20       Lumbar Exercises: Supine   Pelvic Tilt Limitations  attmepted but had increased pain     Bent Knee Raise  10 reps   while maintaining core activation     Shoulder Exercises: Standing   Other Standing Exercises  scap retraction x20 red; shoulder extension x20 red        Nustep 7 min L4       PT Education - 06/18/19 1001    Education Details  reviewed progression of activity    Person(s) Educated  Patient    Methods  Explanation;Demonstration;Tactile cues;Verbal cues    Comprehension   Verbalized understanding;Returned demonstration;Verbal cues required;Tactile cues required       PT Short Term Goals - 06/16/19 1436      PT SHORT TERM GOAL #1   Title  Patient will perfrom log roll without hesitancy and pain    Baseline  perfroming log roll without pain    Time  3    Period  Weeks    Status  Achieved    Target Date  05/27/19      PT SHORT TERM GOAL #2   Title  Patient will transfer sit to stand without using hands    Baseline  able to tranfer 10x without hands    Time  3    Period  Weeks    Status  Achieved    Target Date  05/27/19      PT SHORT TERM GOAL #3   Title  Patient will ambualte 300' with [facility] with good safety and stability    Baseline  No device    Time  3    Period  Weeks    Status  Achieved    Target Date  05/27/19  PT Long Term Goals - 06/16/19 1437      PT LONG TERM GOAL #1   Title  Patient will ambualte 1000' with LRAD with good safety and without increased pain    Baseline  ambualted 505' in 6 minutes without device    Time  6    Period  Weeks    Status  On-going      PT LONG TERM GOAL #2   Title  Patient will put his shoes on without pain    Baseline  is bending more but still using slip pn shoes    Time  6    Period  Weeks    Status  On-going      PT LONG TERM GOAL #3   Title  Patient will stand for 1/2 hour without increased pain    Baseline  can not stand for more then 15  minutes    Time  6    Period  Weeks    Status  On-going      PT LONG TERM GOAL #4   Title  Pt to demo increased strength of Bil LEs and core to at least 4+/5 to improve stability and ability for return to work.    Baseline  Strength has improved with all movements    Time  6    Period  Weeks    Status  On-going            Plan - 06/18/19 0954    Clinical Impression Statement  Therapy focused more on sitting exercises today 2nd to mild baseline sorness. He became slightly more painful with supine exercises. He tolerated seated  exercises well. he had mild pain with standing exercises.Therapy will consider adding steps and squats next visit.    Personal Factors and Comorbidities  Comorbidity 1;Comorbidity 2    Comorbidities  long standing low back pain, anxiety, depression    Examination-Participation Restrictions  Shop;Community Activity;Cleaning;Laundry    Stability/Clinical Decision Making  Evolving/Moderate complexity    Rehab Potential  Good    PT Frequency  2x / week    PT Duration  6 weeks    PT Treatment/Interventions  ADLs/Self Care Home Management;Cryotherapy;Electrical Stimulation;Traction;Moist Heat;Functional mobility training;Therapeutic activities;Therapeutic exercise;Gait training;DME Instruction;Neuromuscular re-education;Patient/family education;Manual techniques;Passive range of motion;Taping    PT Next Visit Plan  Continue general strengthening, light exercise in supine, and endurance program. Review HEP. Consider functional strengthening exercises for UE and LE.    PT Home Exercise Plan  row, shoulder extension, standing march, standing weight shift; seated bilateral er; seated horizontal abduction;    Consulted and Agree with Plan of Care  Patient       Patient will benefit from skilled therapeutic intervention in order to improve the following deficits and impairments:  Abnormal gait, Decreased activity tolerance, Pain, Increased fascial restricitons, Decreased strength, Decreased range of motion, Decreased endurance, Difficulty walking  Visit Diagnosis: Chronic bilateral low back pain without sciatica  Muscle weakness (generalized)  Other abnormalities of gait and mobility  Radiculopathy, lumbar region     Problem List Patient Active Problem List   Diagnosis Date Noted  . Gastroesophageal reflux disease without esophagitis 06/19/2017  . Need for hepatitis A vaccination 06/19/2017  . 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 Routine screening for STI (sexually transmitted infection) 06/07/2016   . SVT (supraventricular tachycardia) ([place]) 10/08/2014  . Depression 10/08/2014  . Atypical nevi 06/22/2014  . Elevated triglycerides with high cholesterol 09/06/2013  . Anxiety state 08/03/2013  . Essential hypertension, benign  08/03/2013  . Environmental allergies 03/23/2013  . Visit for preventive health examination 03/23/2013  . Meniere disease 03/23/2013  . Intrinsic asthma     Carney Living 06/18/2019, 12:35 PM  [facility] [address] [place], [place], [address] Phone: [phone]   Fax:  [phone]  Name: Steve Jacobs MRN: [id number] Date of Birth: 31-Jan-1985

## 2019-06-21 ENCOUNTER — Other Ambulatory Visit: Payer: Self-pay

## 2019-06-21 ENCOUNTER — Other Ambulatory Visit: Payer: Self-pay | Admitting: Physician Assistant

## 2019-06-21 ENCOUNTER — Encounter: Payer: Self-pay | Admitting: Physical Therapy

## 2019-06-21 ENCOUNTER — Ambulatory Visit: Payer: PRIVATE HEALTH INSURANCE | Admitting: Physical Therapy

## 2019-06-21 DIAGNOSIS — M5416 Radiculopathy, lumbar region: Secondary | ICD-10-CM

## 2019-06-21 DIAGNOSIS — M545 Low back pain, unspecified: Secondary | ICD-10-CM

## 2019-06-21 DIAGNOSIS — M6281 Muscle weakness (generalized): Secondary | ICD-10-CM

## 2019-06-21 DIAGNOSIS — G8929 Other chronic pain: Secondary | ICD-10-CM

## 2019-06-21 DIAGNOSIS — R2689 Other abnormalities of gait and mobility: Secondary | ICD-10-CM

## 2019-06-22 ENCOUNTER — Encounter: Payer: Self-pay | Admitting: Physical Therapy

## 2019-06-22 NOTE — Therapy (Signed)
[place], [place], [address] Phone: [phone]   Fax:  [phone]  Physical Therapy Treatment  Patient Details  Name: Steve Jacobs MRN: [id number] Date of Birth: 1985-03-04 Referring Provider (PT): Odelia Gage [facility]    Encounter Date: 06/21/2019  PT End of Session - 06/21/19 1626    Visit Number  15    Number of Visits  23    Date for PT Re-Evaluation  07/28/19    Authorization Type  8 more approved    PT Start Time  1761    PT Stop Time  1627    PT Time Calculation (min)  42 min    Activity Tolerance  Patient tolerated treatment well    Behavior During Therapy  [facility] for tasks assessed/performed       Past Medical History:  Diagnosis Date  . Asthma   . Chicken pox   . Depression    Resolved  . Environmental and seasonal allergies   . Essential hypertension, benign 08/03/2013  . Hyperlipidemia   . Meniere's disease   . Thoracic outlet syndrome     Past Surgical History:  Procedure Laterality Date  . Nevus Biopsy      There were no vitals filed for this visit.  Subjective Assessment - 06/21/19 1611    Subjective  Patient did not wear his brace over the weekend. He also did a lot of exercises.    Limitations  Standing;Walking    How long can you stand comfortably?  As the day goes on he has more difficulty standing    How long can you walk comfortably?  limited didstances    Diagnostic tests  Psot-op solid fusion    Currently in Pain?  Yes    Pain Score  4     Pain Location  Back    Pain Orientation  Left;Right    Pain Descriptors / Indicators  Aching    Pain Type  Chronic pain    Pain Onset  More than a month ago    Pain Frequency  Intermittent    Aggravating Factors   bending and sitting for a long period of time    Pain Relieving Factors  resting, stretches, and exercises                        [facility] Adult PT Treatment/Exercise - 06/22/19 0001      Lumbar  Exercises: Aerobic   Nustep  L5 x 7 min UE/LE   gradually increasing time for endurnace training     Lumbar Exercises: Standing   Row Limitations  3x10 green     Shoulder Extension Limitations  3x10 green     Other Standing Lumbar Exercises  Standing march with cuing for psture 2x15 each leg     Other Standing Lumbar Exercises  standing curls 2x10       Lumbar Exercises: Seated   Other Seated Lumbar Exercises  Dowel flexion 2lb 3x10; bilateral er red 2x10; horizontal abduction 2x10; LAQ 2x15 bilateral; hip anbdcution 2x20; LAQ 2x15 each leg; all with core breathing              PT Education - 06/22/19 1153    Education Details  HEP and symptom management    Person(s) Educated  Patient    Methods  Explanation;Demonstration;Tactile cues;Verbal cues    Comprehension  Verbalized understanding;Returned demonstration;Verbal cues required;Tactile cues required       PT  Short Term Goals - 06/16/19 1436      PT SHORT TERM GOAL #1   Title  Patient will perfrom log roll without hesitancy and pain    Baseline  perfroming log roll without pain    Time  3    Period  Weeks    Status  Achieved    Target Date  05/27/19      PT SHORT TERM GOAL #2   Title  Patient will transfer sit to stand without using hands    Baseline  able to tranfer 10x without hands    Time  3    Period  Weeks    Status  Achieved    Target Date  05/27/19      PT SHORT TERM GOAL #3   Title  Patient will ambualte 300' with [facility] with good safety and stability    Baseline  No device    Time  3    Period  Weeks    Status  Achieved    Target Date  05/27/19        PT Long Term Goals - 06/16/19 1437      PT LONG TERM GOAL #1   Title  Patient will ambualte 1000' with LRAD with good safety and without increased pain    Baseline  ambualted 505' in 6 minutes without device    Time  6    Period  Weeks    Status  On-going      PT LONG TERM GOAL #2   Title  Patient will put his shoes on without pain     Baseline  is bending more but still using slip pn shoes    Time  6    Period  Weeks    Status  On-going      PT LONG TERM GOAL #3   Title  Patient will stand for 1/2 hour without increased pain    Baseline  can not stand for more then 15  minutes    Time  6    Period  Weeks    Status  On-going      PT LONG TERM GOAL #4   Title  Pt to demo increased strength of Bil LEs and core to at least 4+/5 to improve stability and ability for return to work.    Baseline  Strength has improved with all movements    Time  6    Period  Weeks    Status  On-going            Plan - 06/21/19 1659    Clinical Impression Statement  Patient tolerated exercises well. he exercises today without his brace on. We were able to alternate between standing and sitting exercises. He reported a baseline 5/10 pain but had no incrase with exercises. Therapy will continue to advance patient as tolerated.    Personal Factors and Comorbidities  Comorbidity 1;Comorbidity 2    Comorbidities  long standing low back pain, anxiety, depression    Examination-Activity Limitations  Bed Mobility;Squat;Lift;Carry;Reach Overhead;Sit    Examination-Participation Restrictions  Shop;Community Activity;Cleaning;Laundry    Stability/Clinical Decision Making  Evolving/Moderate complexity    Clinical Decision Making  Moderate    Rehab Potential  Good    PT Frequency  2x / week    PT Duration  6 weeks    PT Treatment/Interventions  ADLs/Self Care Home Management;Cryotherapy;Electrical Stimulation;Traction;Moist Heat;Functional mobility training;Therapeutic activities;Therapeutic exercise;Gait training;DME Instruction;Neuromuscular re-education;Patient/family education;Manual techniques;Passive range of motion;Taping    PT Next  Visit Plan  Continue general strengthening, light exercise in supine, and endurance program. Review HEP. Consider functional strengthening exercises for UE and LE.    PT Home Exercise Plan  row, shoulder  extension, standing march, standing weight shift; seated bilateral er; seated horizontal abduction;    Consulted and Agree with Plan of Care  Patient       Patient will benefit from skilled therapeutic intervention in order to improve the following deficits and impairments:  Abnormal gait, Decreased activity tolerance, Pain, Increased fascial restricitons, Decreased strength, Decreased range of motion, Decreased endurance, Difficulty walking  Visit Diagnosis: Chronic bilateral low back pain without sciatica  Muscle weakness (generalized)  Other abnormalities of gait and mobility  Radiculopathy, lumbar region     Problem List Patient Active Problem List   Diagnosis Date Noted  . Gastroesophageal reflux disease without esophagitis 06/19/2017  . Need for hepatitis A vaccination 06/19/2017  . 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 Routine screening for STI (sexually transmitted infection) 06/07/2016  . SVT (supraventricular tachycardia) ([place]) 10/08/2014  . Depression 10/08/2014  . Atypical nevi 06/22/2014  . Elevated triglycerides with high cholesterol 09/06/2013  . Anxiety state 08/03/2013  . Essential hypertension, benign 08/03/2013  . Environmental allergies 03/23/2013  . Visit for preventive health examination 03/23/2013  . Meniere disease 03/23/2013  . Intrinsic asthma     Carney Living PT DPT  06/22/2019, 11:55 AM  [facility] [address] [place], [place], [address] Phone: [phone]   Fax:  [phone]  Name: ROSALIO CATTERTON MRN: [id number] Date of Birth: [birth year]

## 2019-06-24 ENCOUNTER — Ambulatory Visit: Payer: No Typology Code available for payment source | Admitting: Physical Therapy

## 2019-06-25 ENCOUNTER — Other Ambulatory Visit: Payer: Self-pay

## 2019-06-25 ENCOUNTER — Encounter: Payer: Self-pay | Admitting: Physical Therapy

## 2019-06-25 ENCOUNTER — Ambulatory Visit: Payer: PRIVATE HEALTH INSURANCE | Attending: Physician Assistant | Admitting: Physical Therapy

## 2019-06-25 DIAGNOSIS — R2689 Other abnormalities of gait and mobility: Secondary | ICD-10-CM | POA: Diagnosis present

## 2019-06-25 DIAGNOSIS — M6281 Muscle weakness (generalized): Secondary | ICD-10-CM | POA: Insufficient documentation

## 2019-06-25 DIAGNOSIS — M545 Low back pain: Secondary | ICD-10-CM | POA: Diagnosis present

## 2019-06-25 DIAGNOSIS — M5416 Radiculopathy, lumbar region: Secondary | ICD-10-CM | POA: Insufficient documentation

## 2019-06-25 DIAGNOSIS — G8929 Other chronic pain: Secondary | ICD-10-CM | POA: Insufficient documentation

## 2019-06-25 NOTE — Therapy (Signed)
[place], [place], [address] Phone: [phone]   Fax:  [phone]  Physical Therapy Treatment  Patient Details  Name: Steve Jacobs MRN: [id number] Date of Birth: 07/14/1985 Referring Provider (PT): Odelia Gage [facility]    Encounter Date: 06/25/2019   PT End of Session - 06/25/19 1005    Visit Number 16    Number of Visits 23    Date for PT Re-Evaluation 07/28/19    Authorization Type 7 more approved    PT Start Time 1006           Past Medical History:  Diagnosis Date   Asthma    Chicken pox    Depression    Resolved   Environmental and seasonal allergies    Essential hypertension, benign 08/03/2013   Hyperlipidemia    Meniere's disease    Thoracic outlet syndrome     Past Surgical History:  Procedure Laterality Date   Nevus Biopsy      There were no vitals filed for this visit.   Subjective Assessment - 06/25/19 1008    Subjective Pt states he was too sore yesterday to come to therapy; he believes that it was due to being completely weaned off his back brace. He reports pain is much better today    Limitations Standing;Walking    How long can you stand comfortably? As the day goes on he has more difficulty standing    How long can you walk comfortably? limited didstances    Diagnostic tests Psot-op solid fusion    Currently in Pain? Yes    Pain Score 2     Pain Location Back    Pain Orientation Right;Left    Pain Descriptors / Indicators Aching    Pain Type Chronic pain    Pain Onset More than a month ago    Multiple Pain Sites No                             [facility] Adult PT Treatment/Exercise - 06/25/19 0001      Lumbar Exercises: Aerobic   Nustep L5 x 9 min      Lumbar Exercises: Standing   Row Limitations 10 green, 2x10 blue    Shoulder Extension Limitations 2x10 blue    Other Standing Lumbar Exercises 3 way hip 1x10 green      Lumbar Exercises:  Seated   Other Seated Lumbar Exercises On pilates ball marching 2x10, alternating arm lifting 2x10                    PT Short Term Goals - 06/16/19 1436      PT SHORT TERM GOAL #1   Title Patient will perfrom log roll without hesitancy and pain    Baseline perfroming log roll without pain    Time 3    Period Weeks    Status Achieved    Target Date 05/27/19      PT SHORT TERM GOAL #2   Title Patient will transfer sit to stand without using hands    Baseline able to tranfer 10x without hands    Time 3    Period Weeks    Status Achieved    Target Date 05/27/19      PT SHORT TERM GOAL #3   Title Patient will ambualte 300' with SPC with good safety and stability    Baseline No device  Time 3    Period Weeks    Status Achieved    Target Date 05/27/19             PT Long Term Goals - 06/16/19 1437      PT LONG TERM GOAL #1   Title Patient will ambualte 1000' with LRAD with good safety and without increased pain    Baseline ambualted 505' in 6 minutes without device    Time 6    Period Weeks    Status On-going      PT LONG TERM GOAL #2   Title Patient will put his shoes on without pain    Baseline is bending more but still using slip pn shoes    Time 6    Period Weeks    Status On-going      PT LONG TERM GOAL #3   Title Patient will stand for 1/2 hour without increased pain    Baseline can not stand for more then 15  minutes    Time 6    Period Weeks    Status On-going      PT LONG TERM GOAL #4   Title Pt to demo increased strength of Bil LEs and core to at least 4+/5 to improve stability and ability for return to work.    Baseline Strength has improved with all movements    Time 6    Period Weeks    Status On-going                 Plan - 06/25/19 1020    Clinical Impression Statement Pt with improving strength and endurance. Treatment focused on increasing strengthening and increasing standing/weight bearing exercises.    Personal  Factors and Comorbidities Comorbidity 1;Comorbidity 2    Comorbidities long standing low back pain, anxiety, depression    Examination-Activity Limitations Bed Mobility;Squat;Lift;Carry;Reach Overhead;Sit    Examination-Participation Restrictions Shop;Community Activity;Cleaning;Laundry    Stability/Clinical Decision Making Evolving/Moderate complexity    Rehab Potential Good    PT Frequency 2x / week    PT Duration 6 weeks    PT Treatment/Interventions ADLs/Self Care Home Management;Cryotherapy;Electrical Stimulation;Traction;Moist Heat;Functional mobility training;Therapeutic activities;Therapeutic exercise;Gait training;DME Instruction;Neuromuscular re-education;Patient/family education;Manual techniques;Passive range of motion;Taping    PT Next Visit Plan Continue general strengthening, light exercise in supine, and endurance program. Review HEP. Consider functional strengthening exercises for UE and LE.    PT Home Exercise Plan row, shoulder extension, standing march, standing weight shift; seated bilateral er; seated horizontal abduction;    Consulted and Agree with Plan of Care Patient           Patient will benefit from skilled therapeutic intervention in order to improve the following deficits and impairments:  Abnormal gait, Decreased activity tolerance, Pain, Increased fascial restricitons, Decreased strength, Decreased range of motion, Decreased endurance, Difficulty walking  Visit Diagnosis: Chronic bilateral low back pain without sciatica  Muscle weakness (generalized)  Other abnormalities of gait and mobility  Radiculopathy, lumbar region     Problem List Patient Active Problem List   Diagnosis Date Noted   Gastroesophageal reflux disease without esophagitis 06/19/2017   Need for hepatitis A vaccination 06/19/2017   Primary insomnia 02/02/2017   Routine screening for STI (sexually transmitted infection) 06/07/2016   SVT (supraventricular tachycardia) ([place])  10/08/2014   Depression 10/08/2014   Atypical nevi 06/22/2014   Elevated triglycerides with high cholesterol 09/06/2013   Anxiety state 08/03/2013   Essential hypertension, benign 08/03/2013   Environmental allergies 03/23/2013   Visit  for preventive health examination 03/23/2013   Meniere disease 03/23/2013   Intrinsic asthma     Breda Bond April Ma L [place] PT, DPT 06/25/2019, 10:46 AM  [facility] [address] [place], [place], [address] Phone: [phone]   Fax:  [phone]  Name: Steve Jacobs MRN: [id number] Date of Birth: Nov 29, 1985

## 2019-06-28 ENCOUNTER — Encounter: Payer: Self-pay | Admitting: Physician Assistant

## 2019-06-28 ENCOUNTER — Other Ambulatory Visit: Payer: Self-pay | Admitting: Physician Assistant

## 2019-06-28 MED ORDER — LORAZEPAM 2 MG PO TABS: ORAL_TABLET | ORAL | 1 refills | Status: DC

## 2019-06-28 MED FILL — CYCLOBENZAPRINE HCL 10 MG T: 10 | 30 days supply | Qty: 30 | Fill #0

## 2019-06-28 MED FILL — METHOCARBAMOL 750 MG TABS: 750 | 20 days supply | Qty: 40 | Fill #0

## 2019-06-28 MED FILL — ESCITALOPRAM 20 MG TABLET: 20 | 90 days supply | Qty: 90 | Fill #1

## 2019-06-28 MED FILL — MONTELUKAST SOD 10 MG TAB: 10 | 90 days supply | Qty: 90 | Fill #1

## 2019-06-30 MED FILL — LORazepam 2 MG TABS: 2 | 20 days supply | Qty: 60 | Fill #0

## 2019-07-06 ENCOUNTER — Other Ambulatory Visit: Payer: Self-pay

## 2019-07-06 ENCOUNTER — Ambulatory Visit: Payer: PRIVATE HEALTH INSURANCE | Admitting: Physical Therapy

## 2019-07-06 ENCOUNTER — Encounter: Payer: Self-pay | Admitting: Physical Therapy

## 2019-07-06 DIAGNOSIS — G8929 Other chronic pain: Secondary | ICD-10-CM

## 2019-07-06 DIAGNOSIS — R2689 Other abnormalities of gait and mobility: Secondary | ICD-10-CM

## 2019-07-06 DIAGNOSIS — M545 Low back pain, unspecified: Secondary | ICD-10-CM

## 2019-07-06 DIAGNOSIS — M6281 Muscle weakness (generalized): Secondary | ICD-10-CM

## 2019-07-06 DIAGNOSIS — M5416 Radiculopathy, lumbar region: Secondary | ICD-10-CM

## 2019-07-06 MED FILL — HYDROCODON-APAP 5-325: 5-325 | 7 days supply | Qty: 21 | Fill #0

## 2019-07-06 NOTE — Therapy (Signed)
[place], [place], [address] Phone: [phone]   Fax:  [phone]  Physical Therapy Treatment  Patient Details  Name: Steve Jacobs MRN: [id number] Date of Birth: 11-Feb-1985 Referring Provider (PT): Odelia Gage [facility]    Encounter Date: 07/06/2019   PT End of Session - 07/06/19 1417    Visit Number 17    Number of Visits 23    Date for PT Re-Evaluation 07/28/19    Authorization Type 7 more approved    PT Start Time 1416    PT Stop Time 1505    PT Time Calculation (min) 49 min    Activity Tolerance Patient tolerated treatment well    Behavior During Therapy [facility] for tasks assessed/performed           Past Medical History:  Diagnosis Date  . Asthma   . Chicken pox   . Depression    Resolved  . Environmental and seasonal allergies   . Essential hypertension, benign 08/03/2013  . Hyperlipidemia   . Meniere's disease   . Thoracic outlet syndrome     Past Surgical History:  Procedure Laterality Date  . Nevus Biopsy      There were no vitals filed for this visit.   Subjective Assessment - 07/06/19 1417    Subjective Pt says he has had a set back since last therapy visit. Pt had radiating pain into the buttocks down the leg and into the foot. From knee into the foot pt feels numb, which has been going on for a week. Patient was in "excruciating pain".    Limitations Standing;Walking    How long can you stand comfortably? As the day goes on he has more difficulty standing    How long can you walk comfortably? limited didstances    Diagnostic tests Psot-op solid fusion    Patient Stated Goals to go back to work    Currently in Pain? Yes    Pain Score 3     Pain Location Back    Pain Orientation Right;Left    Pain Type Chronic pain    Pain Radiating Towards radiates into right buttock and down to knee and foot    Pain Onset More than a month ago    Pain Frequency Intermittent    Aggravating  Factors  bending and sitting for a long period of time    Pain Relieving Factors resting, stretches, and exercises              [facility] PT Assessment - 07/06/19 0001      Assessment   Medical Diagnosis Lumbar fusion/ lamiectomy     Referring Provider (PT) Mark Rescino [facility]                          [facility] Adult PT Treatment/Exercise - 07/06/19 0001      Lumbar Exercises: Stretches   Piriformis Stretch Left;30 seconds;1 rep   while seated on tennis ball     Lumbar Exercises: Aerobic   Nustep L5 x 5 min UE/LE      Lumbar Exercises: Standing   Other Standing Lumbar Exercises hip abduction / extension 2 x 10 with red theraband around ankles      Lumbar Exercises: Seated   Hip Flexion on Ball Strengthening;Both;10 reps   seated on green physioball   Other Seated Lumbar Exercises Piriformis strenghtening with red band 2x10      Modalities   Modalities  Moist Heat      Moist Heat Therapy   Number Minutes Moist Heat 10 Minutes    Moist Heat Location Lumbar Spine   in supine     Manual Therapy   Manual therapy comments MTPR along the L piriformis x 3    Soft tissue mobilization tack and stretch of the L piriformis                  PT Education - 07/06/19 1508    Education Details A&P of Piriformis and Hip; Piriformis trigger point release techniques    Person(s) Educated Patient    Methods Explanation;Demonstration;Verbal cues    Comprehension Verbalized understanding;Returned demonstration            PT Short Term Goals - 06/16/19 1436      PT SHORT TERM GOAL #1   Title Patient will perfrom log roll without hesitancy and pain    Baseline perfroming log roll without pain    Time 3    Period Weeks    Status Achieved    Target Date 05/27/19      PT SHORT TERM GOAL #2   Title Patient will transfer sit to stand without using hands    Baseline able to tranfer 10x without hands    Time 3    Period Weeks    Status Achieved    Target Date 05/27/19       PT SHORT TERM GOAL #3   Title Patient will ambualte 300' with [facility] with good safety and stability    Baseline No device    Time 3    Period Weeks    Status Achieved    Target Date 05/27/19             PT Long Term Goals - 06/16/19 1437      PT LONG TERM GOAL #1   Title Patient will ambualte 1000' with LRAD with good safety and without increased pain    Baseline ambualted 505' in 6 minutes without device    Time 6    Period Weeks    Status On-going      PT LONG TERM GOAL #2   Title Patient will put his shoes on without pain    Baseline is bending more but still using slip pn shoes    Time 6    Period Weeks    Status On-going      PT LONG TERM GOAL #3   Title Patient will stand for 1/2 hour without increased pain    Baseline can not stand for more then 15  minutes    Time 6    Period Weeks    Status On-going      PT LONG TERM GOAL #4   Title Pt to demo increased strength of Bil LEs and core to at least 4+/5 to improve stability and ability for return to work.    Baseline Strength has improved with all movements    Time 6    Period Weeks    Status On-going                 Plan - 07/06/19 1539    Clinical Impression Statement pt reports after the last session he was down for around 40 hours due increased soreness. He stated he called his MD and they stated they released him from the brace too soon and returned to 50/50 and wean at his discretion. continued working hip and core strengthening today which he  fatigues very quickly with. STW performed on the L piriformis which he responded well to. utilized MHP end of session to calm down soreness.    PT Treatment/Interventions ADLs/Self Care Home Management;Cryotherapy;Electrical Stimulation;Traction;Moist Heat;Functional mobility training;Therapeutic activities;Therapeutic exercise;Gait training;DME Instruction;Neuromuscular re-education;Patient/family education;Manual techniques;Passive range of motion;Taping     PT Next Visit Plan Continue general strengthening, light exercise in supine, and endurance program. Review HEP. Consider functional strengthening exercises for UE and LE.    PT Home Exercise Plan row, shoulder extension, standing march, standing weight shift; seated bilateral er; seated horizontal abduction;           Patient will benefit from skilled therapeutic intervention in order to improve the following deficits and impairments:  Abnormal gait, Decreased activity tolerance, Pain, Increased fascial restricitons, Decreased strength, Decreased range of motion, Decreased endurance, Difficulty walking  Visit Diagnosis: Muscle weakness (generalized)  Other abnormalities of gait and mobility  Chronic bilateral low back pain without sciatica  Radiculopathy, lumbar region     Problem List Patient Active Problem List   Diagnosis Date Noted  . Gastroesophageal reflux disease without esophagitis 06/19/2017  . Need for hepatitis A vaccination 06/19/2017  . 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 Routine screening for STI (sexually transmitted infection) 06/07/2016  . SVT (supraventricular tachycardia) ([place]) 10/08/2014  . Depression 10/08/2014  . Atypical nevi 06/22/2014  . Elevated triglycerides with high cholesterol 09/06/2013  . Anxiety state 08/03/2013  . Essential hypertension, benign 08/03/2013  . Environmental allergies 03/23/2013  . Visit for preventive health examination 03/23/2013  . Meniere disease 03/23/2013  . Intrinsic asthma    Starr Lake PT, DPT, LAT, ATC  07/06/19  3:46 PM      [facility] [address] [place], [place], [address] Phone: [phone]   Fax:  [phone]  Name: Steve Jacobs MRN: [id number] Date of Birth: [birth year]

## 2019-07-08 ENCOUNTER — Ambulatory Visit: Payer: PRIVATE HEALTH INSURANCE | Admitting: Physical Therapy

## 2019-07-08 ENCOUNTER — Other Ambulatory Visit: Payer: Self-pay

## 2019-07-08 ENCOUNTER — Encounter: Payer: Self-pay | Admitting: Physical Therapy

## 2019-07-08 DIAGNOSIS — R2689 Other abnormalities of gait and mobility: Secondary | ICD-10-CM

## 2019-07-08 DIAGNOSIS — M545 Low back pain, unspecified: Secondary | ICD-10-CM

## 2019-07-08 DIAGNOSIS — M5416 Radiculopathy, lumbar region: Secondary | ICD-10-CM

## 2019-07-08 DIAGNOSIS — M6281 Muscle weakness (generalized): Secondary | ICD-10-CM

## 2019-07-08 DIAGNOSIS — G8929 Other chronic pain: Secondary | ICD-10-CM

## 2019-07-09 NOTE — Therapy (Signed)
[place], [place], [address] Phone: [phone]   Fax:  [phone]  Physical Therapy Treatment  Patient Details  Name: Steve Jacobs MRN: [id number] Date of Birth: 31-Oct-1985 Referring Provider (PT): Odelia Gage [facility]    Encounter Date: 07/08/2019   PT End of Session - 07/08/19 1431    Visit Number 18    Number of Visits 23    Date for PT Re-Evaluation 07/28/19    PT Start Time 0300    PT Stop Time 1457    PT Time Calculation (min) 40 min    Activity Tolerance Patient tolerated treatment well    Behavior During Therapy [facility] for tasks assessed/performed           Past Medical History:  Diagnosis Date  . Asthma   . Chicken pox   . Depression    Resolved  . Environmental and seasonal allergies   . Essential hypertension, benign 08/03/2013  . Hyperlipidemia   . Meniere's disease   . Thoracic outlet syndrome     Past Surgical History:  Procedure Laterality Date  . Nevus Biopsy      There were no vitals filed for this visit.   Subjective Assessment - 07/08/19 1429    Subjective Patient states he has felt better since last visit. The piriformis trigger point release helped decrese the pain down the leg. Still a dull ache down the leg, but better than two days ago.    How long can you stand comfortably? As the day goes on he has more difficulty standing    How long can you walk comfortably? limited didstances    Diagnostic tests Psot-op solid fusion    Patient Stated Goals to go back to work    Currently in Pain? Yes    Pain Location Back    Pain Orientation Right;Left    Pain Descriptors / Indicators Aching    Pain Type Chronic pain    Pain Onset More than a month ago    Pain Frequency Intermittent    Aggravating Factors  bending and sitting    Pain Relieving Factors resting and stretches                             [facility] Adult PT Treatment/Exercise - 07/09/19 0001       Lumbar Exercises: Stretches   Lower Trunk Rotation Limitations x20 bilateral     Piriformis Stretch Left;Right;2 reps;30 seconds   supine     Lumbar Exercises: Aerobic   Nustep L5 x 5 min UE/LE      Lumbar Exercises: Standing   Row Limitations x20 green     Shoulder Extension Limitations x20 green     Other Standing Lumbar Exercises standing march x15 each leg     Other Standing Lumbar Exercises hip abduction / extension 2 x 10 with red theraband around ankles      Shoulder Exercises: Seated   Other Seated Exercises seated bilateral ER x20 red       Manual Therapy   Manual therapy comments MTPR along the L piriformis x 3; along paraspinals    Soft tissue mobilization trigger point release to piriformis                   PT Education - 07/09/19 0817    Education Details reviewed self streting technique    Person(s) Educated Patient    Methods Explanation;Tactile  cues;Demonstration;Verbal cues    Comprehension Verbalized understanding;Returned demonstration;Verbal cues required;Tactile cues required            PT Short Term Goals - 06/16/19 1436      PT SHORT TERM GOAL #1   Title Patient will perfrom log roll without hesitancy and pain    Baseline perfroming log roll without pain    Time 3    Period Weeks    Status Achieved    Target Date 05/27/19      PT SHORT TERM GOAL #2   Title Patient will transfer sit to stand without using hands    Baseline able to tranfer 10x without hands    Time 3    Period Weeks    Status Achieved    Target Date 05/27/19      PT SHORT TERM GOAL #3   Title Patient will ambualte 300' with [facility] with good safety and stability    Baseline No device    Time 3    Period Weeks    Status Achieved    Target Date 05/27/19             PT Long Term Goals - 06/16/19 1437      PT LONG TERM GOAL #1   Title Patient will ambualte 1000' with LRAD with good safety and without increased pain    Baseline ambualted 505' in 6 minutes  without device    Time 6    Period Weeks    Status On-going      PT LONG TERM GOAL #2   Title Patient will put his shoes on without pain    Baseline is bending more but still using slip pn shoes    Time 6    Period Weeks    Status On-going      PT LONG TERM GOAL #3   Title Patient will stand for 1/2 hour without increased pain    Baseline can not stand for more then 15  minutes    Time 6    Period Weeks    Status On-going      PT LONG TERM GOAL #4   Title Pt to demo increased strength of Bil LEs and core to at least 4+/5 to improve stability and ability for return to work.    Baseline Strength has improved with all movements    Time 6    Period Weeks    Status On-going                 Plan - 07/09/19 0623    Clinical Impression Statement Patients radicualr symptoms have improved from last visit. he continues to have tenderness to palpation in his lumbar spine but it has improved. He had mild TTP in the piriformis area. He was able to get back to his moderate intensity ther-ex without much pain. therapy will continue to progress as tolerated.    Personal Factors and Comorbidities Comorbidity 1;Comorbidity 2    Comorbidities long standing low back pain, anxiety, depression    Examination-Activity Limitations Bed Mobility;Squat;Lift;Carry;Reach Overhead;Sit    Examination-Participation Restrictions Shop;Community Activity;Cleaning;Laundry    Stability/Clinical Decision Making Evolving/Moderate complexity    Clinical Decision Making Moderate    Rehab Potential Good    PT Frequency 2x / week    PT Duration 6 weeks    PT Treatment/Interventions ADLs/Self Care Home Management;Cryotherapy;Electrical Stimulation;Traction;Moist Heat;Functional mobility training;Therapeutic activities;Therapeutic exercise;Gait training;DME Instruction;Neuromuscular re-education;Patient/family education;Manual techniques;Passive range of motion;Taping    PT Next Visit Plan Continue  general  strengthening, light exercise in supine, and endurance program. Review HEP. Consider functional strengthening exercises for UE and LE.    PT Home Exercise Plan row, shoulder extension, standing march, standing weight shift; seated bilateral er; seated horizontal abduction;    Consulted and Agree with Plan of Care Patient           Patient will benefit from skilled therapeutic intervention in order to improve the following deficits and impairments:  Abnormal gait, Decreased activity tolerance, Pain, Increased fascial restricitons, Decreased strength, Decreased range of motion, Decreased endurance, Difficulty walking  Visit Diagnosis: Muscle weakness (generalized)  Other abnormalities of gait and mobility  Chronic bilateral low back pain without sciatica  Radiculopathy, lumbar region     Problem List Patient Active Problem List   Diagnosis Date Noted  . Gastroesophageal reflux disease without esophagitis 06/19/2017  . Need for hepatitis A vaccination 06/19/2017  . 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 Routine screening for STI (sexually transmitted infection) 06/07/2016  . SVT (supraventricular tachycardia) ([place]) 10/08/2014  . Depression 10/08/2014  . Atypical nevi 06/22/2014  . Elevated triglycerides with high cholesterol 09/06/2013  . Anxiety state 08/03/2013  . Essential hypertension, benign 08/03/2013  . Environmental allergies 03/23/2013  . Visit for preventive health examination 03/23/2013  . Meniere disease 03/23/2013  . Intrinsic asthma     Carney Living  PT DPT  07/09/2019, 8:22 AM  [facility] [address] [place], [place], [address] Phone: [phone]   Fax:  [phone]  Name: Steve Jacobs MRN: [id number] Date of Birth: 03/12/1985

## 2019-07-12 ENCOUNTER — Other Ambulatory Visit: Payer: Self-pay | Admitting: Physician Assistant

## 2019-07-12 NOTE — Telephone Encounter (Signed)
Promethazine LFD 03/15/19 with 0 refills LOV 06/15/19 NOV none

## 2019-07-13 ENCOUNTER — Ambulatory Visit: Payer: PRIVATE HEALTH INSURANCE | Admitting: Physical Therapy

## 2019-07-13 MED FILL — PROMETHAZINE 25 MG TABLET: 25 | 7 days supply | Qty: 20 | Fill #0

## 2019-07-14 ENCOUNTER — Other Ambulatory Visit: Payer: Self-pay

## 2019-07-14 ENCOUNTER — Ambulatory Visit: Payer: PRIVATE HEALTH INSURANCE | Admitting: Physical Therapy

## 2019-07-14 DIAGNOSIS — G8929 Other chronic pain: Secondary | ICD-10-CM

## 2019-07-14 DIAGNOSIS — M6281 Muscle weakness (generalized): Secondary | ICD-10-CM

## 2019-07-14 DIAGNOSIS — M5416 Radiculopathy, lumbar region: Secondary | ICD-10-CM

## 2019-07-14 DIAGNOSIS — M545 Low back pain: Secondary | ICD-10-CM | POA: Diagnosis not present

## 2019-07-14 DIAGNOSIS — R2689 Other abnormalities of gait and mobility: Secondary | ICD-10-CM

## 2019-07-14 NOTE — Therapy (Signed)
[place], [place], [address] Phone: [phone]   Fax:  [phone]  Physical Therapy Treatment  Patient Details  Name: Steve Jacobs MRN: [id number] Date of Birth: 1985/05/29 Referring Provider (PT): Odelia Gage [facility]    Encounter Date: 07/14/2019   PT End of Session - 07/14/19 1701    Visit Number 19    Number of Visits 23    Date for PT Re-Evaluation 07/28/19    PT Start Time 4580    PT Stop Time 1701    PT Time Calculation (min) 40 min    Activity Tolerance Patient tolerated treatment well    Behavior During Therapy [facility] for tasks assessed/performed           Past Medical History:  Diagnosis Date   Asthma    Chicken pox    Depression    Resolved   Environmental and seasonal allergies    Essential hypertension, benign 08/03/2013   Hyperlipidemia    Meniere's disease    Thoracic outlet syndrome     Past Surgical History:  Procedure Laterality Date   Nevus Biopsy      There were no vitals filed for this visit.   Subjective Assessment - 07/14/19 1625    Subjective Pt reports he feels back to baseline. 1/10 ache in Lt LE. Reports under Rt foot feels numb and states he has been tripping on things that he normally doesn't.    How long can you stand comfortably? As the day goes on he has more difficulty standing    How long can you walk comfortably? limited didstances    Diagnostic tests Psot-op solid fusion    Patient Stated Goals to go back to work    Currently in Pain? Yes    Pain Score 1     Pain Onset More than a month ago                             [facility] Adult PT Treatment/Exercise - 07/14/19 0001      Lumbar Exercises: Aerobic   Nustep L6 x 6 min UE/LE      Lumbar Exercises: Standing   Other Standing Lumbar Exercises on airex: static feet apart x 30 sec, eyes closed x 30 sec, eyes closed with head turns horizontal and vertical x 30 sec, marching x 30  sec    Other Standing Lumbar Exercises hip abduction / extension 2 x 10 with red theraband around ankles; step over obstacle side to side x 10 reps      Knee/Hip Exercises: Standing   SLS 20 sec on R, 5 sec on L; Single leg with clock reach x 10 reps                    PT Short Term Goals - 06/16/19 1436      PT SHORT TERM GOAL #1   Title Patient will perfrom log roll without hesitancy and pain    Baseline perfroming log roll without pain    Time 3    Period Weeks    Status Achieved    Target Date 05/27/19      PT SHORT TERM GOAL #2   Title Patient will transfer sit to stand without using hands    Baseline able to tranfer 10x without hands    Time 3    Period Weeks    Status Achieved  Target Date 05/27/19      PT SHORT TERM GOAL #3   Title Patient will ambualte 300' with SPC with good safety and stability    Baseline No device    Time 3    Period Weeks    Status Achieved    Target Date 05/27/19             PT Long Term Goals - 06/16/19 1437      PT LONG TERM GOAL #1   Title Patient will ambualte 1000' with LRAD with good safety and without increased pain    Baseline ambualted 505' in 6 minutes without device    Time 6    Period Weeks    Status On-going      PT LONG TERM GOAL #2   Title Patient will put his shoes on without pain    Baseline is bending more but still using slip pn shoes    Time 6    Period Weeks    Status On-going      PT LONG TERM GOAL #3   Title Patient will stand for 1/2 hour without increased pain    Baseline can not stand for more then 15  minutes    Time 6    Period Weeks    Status On-going      PT LONG TERM GOAL #4   Title Pt to demo increased strength of Bil LEs and core to at least 4+/5 to improve stability and ability for return to work.    Baseline Strength has improved with all movements    Time 6    Period Weeks    Status On-going                 Plan - 07/14/19 1702    Clinical Impression Statement  Pt reports feeling back to his previous function prior to sudden onset of excruciating L LE pain ~1 wk ago. Pt mostly uses brace during walks with his dog. Pt demonstrates decreased L LE stability vs R LE leading to reports of difficulties donning/doffing shoes and stair negotiation. Treatment focused on continued bilat LE strengthening, single leg stance, and initiation of balance exercises.    Personal Factors and Comorbidities Comorbidity 1;Comorbidity 2    Comorbidities long standing low back pain, anxiety, depression    Examination-Activity Limitations Bed Mobility;Squat;Lift;Carry;Reach Overhead;Sit    Examination-Participation Restrictions Shop;Community Activity;Cleaning;Laundry    Stability/Clinical Decision Making Evolving/Moderate complexity    Rehab Potential Good    PT Frequency 2x / week    PT Duration 6 weeks    PT Treatment/Interventions ADLs/Self Care Home Management;Cryotherapy;Electrical Stimulation;Traction;Moist Heat;Functional mobility training;Therapeutic activities;Therapeutic exercise;Gait training;DME Instruction;Neuromuscular re-education;Patient/family education;Manual techniques;Passive range of motion;Taping    PT Next Visit Plan Continue general strengthening, endurance program, and continuing to progress balance/proprioception. Review HEP. Consider functional strengthening exercises for UE and LE.    PT Home Exercise Plan row, shoulder extension, standing march, standing weight shift; seated bilateral er; seated horizontal abduction;    Consulted and Agree with Plan of Care Patient           Patient will benefit from skilled therapeutic intervention in order to improve the following deficits and impairments:  Abnormal gait, Decreased activity tolerance, Pain, Increased fascial restricitons, Decreased strength, Decreased range of motion, Decreased endurance, Difficulty walking  Visit Diagnosis: Muscle weakness (generalized)  Other abnormalities of gait and  mobility  Chronic bilateral low back pain without sciatica  Radiculopathy, lumbar region     Problem List  Patient Active Problem List   Diagnosis Date Noted   Gastroesophageal reflux disease without esophagitis 06/19/2017   Need for hepatitis A vaccination 06/19/2017   Primary insomnia 02/02/2017   Routine screening for STI (sexually transmitted infection) 06/07/2016   SVT (supraventricular tachycardia) ([place]) 10/08/2014   Depression 10/08/2014   Atypical nevi 06/22/2014   Elevated triglycerides with high cholesterol 09/06/2013   Anxiety state 08/03/2013   Essential hypertension, benign 08/03/2013   Environmental allergies 03/23/2013   Visit for preventive health examination 03/23/2013   Meniere disease 03/23/2013   Intrinsic asthma     Ashya Nicolaisen April Beatrix Shipper [place] 07/14/2019, 5:54 PM  [facility] [address] [place], [place], [address] Phone: [phone]   Fax:  [phone]  Name: Steve Jacobs MRN: [id number] Date of Birth: 01/03/1986

## 2019-07-15 ENCOUNTER — Ambulatory Visit: Payer: PRIVATE HEALTH INSURANCE | Admitting: Physical Therapy

## 2019-07-16 MED FILL — METHOCARBAMOL 750 MG TABS: 750 | 45 days supply | Qty: 90 | Fill #0

## 2019-07-16 MED FILL — PREGABALIN 150 MG CAPS: 150 | 45 days supply | Qty: 90 | Fill #0

## 2019-07-21 ENCOUNTER — Ambulatory Visit: Payer: PRIVATE HEALTH INSURANCE | Attending: Physician Assistant | Admitting: Physical Therapy

## 2019-07-21 ENCOUNTER — Other Ambulatory Visit: Payer: Self-pay

## 2019-07-21 ENCOUNTER — Encounter: Payer: Self-pay | Admitting: Physical Therapy

## 2019-07-21 DIAGNOSIS — G8929 Other chronic pain: Secondary | ICD-10-CM | POA: Diagnosis present

## 2019-07-21 DIAGNOSIS — R2689 Other abnormalities of gait and mobility: Secondary | ICD-10-CM | POA: Diagnosis present

## 2019-07-21 DIAGNOSIS — M5416 Radiculopathy, lumbar region: Secondary | ICD-10-CM | POA: Insufficient documentation

## 2019-07-21 DIAGNOSIS — M545 Low back pain: Secondary | ICD-10-CM | POA: Insufficient documentation

## 2019-07-21 DIAGNOSIS — M6281 Muscle weakness (generalized): Secondary | ICD-10-CM | POA: Insufficient documentation

## 2019-07-21 NOTE — Therapy (Signed)
[place], [place], [address] Phone: [phone]   Fax:  [phone]  Physical Therapy Treatment  Patient Details  Name: Steve Jacobs MRN: [id number] Date of Birth: 1985/02/18 Referring Provider (PT): Odelia Gage [facility]    Encounter Date: 07/21/2019   PT End of Session - 07/21/19 1429    Visit Number 20    Number of Visits 23    Date for PT Re-Evaluation 07/28/19    PT Start Time 1749   pt arrive 5 minutes late   PT Stop Time 1500    PT Time Calculation (min) 40 min    Activity Tolerance Patient tolerated treatment well    Behavior During Therapy [facility] for tasks assessed/performed           Past Medical History:  Diagnosis Date  . Asthma   . Chicken pox   . Depression    Resolved  . Environmental and seasonal allergies   . Essential hypertension, benign 08/03/2013  . Hyperlipidemia   . Meniere's disease   . Thoracic outlet syndrome     Past Surgical History:  Procedure Laterality Date  . Nevus Biopsy      There were no vitals filed for this visit.   Subjective Assessment - 07/21/19 1427    Subjective Patient reports he is in pain today. His MD took him off his pain meds on Friday, so his pain has increased. 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    Limitations Standing;Walking    How long can you stand comfortably? As the day goes on he has more difficulty standing    How long can you walk comfortably? limited didstances    Diagnostic tests Psot-op solid fusion    Patient Stated Goals to go back to work    Currently in Pain? Yes    Pain Score 4     Pain Location Back    Pain Orientation Right;Left    Pain Type Chronic pain    Pain Radiating Towards radiating into right buttock and down leg    Pain Onset More than a month ago    Pain Frequency Intermittent                             [facility] Adult PT Treatment/Exercise - 07/21/19 0001      Lumbar Exercises:  Stretches   Passive Hamstring Stretch 2 reps;30 seconds    Lower Trunk Rotation Limitations x20    Hip Flexor Stretch 2 reps;30 seconds;Right   in //   Piriformis Stretch Left;Right;2 reps;30 seconds   supine     Lumbar Exercises: Aerobic   Nustep L5 x 6 min UE/LE      Lumbar Exercises: Seated   Long Arc Quad on Chair 2 sets;10 reps   2.5lbs   Other Seated Lumbar Exercises Hamstring curls 2x10 each leg   green     Lumbar Exercises: Supine   Clam 20 reps    Clam Limitations green                  PT Education - 07/21/19 1647    Education Details HEP and symptom management    Person(s) Educated Patient    Methods Explanation;Demonstration;Tactile cues;Verbal cues    Comprehension Tactile cues required;Verbal cues required;Returned demonstration;Verbalized understanding            PT Short Term Goals - 06/16/19 1436  PT SHORT TERM GOAL #1   Title Patient will perfrom log roll without hesitancy and pain    Baseline perfroming log roll without pain    Time 3    Period Weeks    Status Achieved    Target Date 05/27/19      PT SHORT TERM GOAL #2   Title Patient will transfer sit to stand without using hands    Baseline able to tranfer 10x without hands    Time 3    Period Weeks    Status Achieved    Target Date 05/27/19      PT SHORT TERM GOAL #3   Title Patient will ambualte 300' with [facility] with good safety and stability    Baseline No device    Time 3    Period Weeks    Status Achieved    Target Date 05/27/19             PT Long Term Goals - 06/16/19 1437      PT LONG TERM GOAL #1   Title Patient will ambualte 1000' with LRAD with good safety and without increased pain    Baseline ambualted 505' in 6 minutes without device    Time 6    Period Weeks    Status On-going      PT LONG TERM GOAL #2   Title Patient will put his shoes on without pain    Baseline is bending more but still using slip pn shoes    Time 6    Period Weeks    Status  On-going      PT LONG TERM GOAL #3   Title Patient will stand for 1/2 hour without increased pain    Baseline can not stand for more then 15  minutes    Time 6    Period Weeks    Status On-going      PT LONG TERM GOAL #4   Title Pt to demo increased strength of Bil LEs and core to at least 4+/5 to improve stability and ability for return to work.    Baseline Strength has improved with all movements    Time 6    Period Weeks    Status On-going                 Plan - 07/21/19 1456    Clinical Impression Statement Patient had increased soreness at baseline today. He reported improved pain with stretching but had a minor increase in numbness with hamstring and hip flexor stretching. Therapy added weight for LAQ and patient showed signs of fatigue with significant leg shaking. Patient tolerated seated ther-ex well.    Comorbidities long standing low back pain, anxiety, depression    Examination-Activity Limitations Bed Mobility;Squat;Lift;Carry;Reach Overhead;Sit    Examination-Participation Restrictions Shop;Community Activity;Cleaning;Laundry    Stability/Clinical Decision Making Evolving/Moderate complexity    Clinical Decision Making Moderate    PT Frequency 2x / week    PT Duration 6 weeks    PT Treatment/Interventions ADLs/Self Care Home Management;Cryotherapy;Electrical Stimulation;Traction;Moist Heat;Functional mobility training;Therapeutic activities;Therapeutic exercise;Gait training;DME Instruction;Neuromuscular re-education;Patient/family education;Manual techniques;Passive range of motion;Taping    PT Next Visit Plan Continue general strengthening, endurance program, and continuing to progress balance/proprioception. Review HEP. Consider functional strengthening exercises for UE and LE.    PT Home Exercise Plan row, shoulder extension, standing march, standing weight shift; seated bilateral er; seated horizontal abduction;    Consulted and Agree with Plan of Care Patient  Patient will benefit from skilled therapeutic intervention in order to improve the following deficits and impairments:  Abnormal gait, Decreased activity tolerance, Pain, Increased fascial restricitons, Decreased strength, Decreased range of motion, Decreased endurance, Difficulty walking  Visit Diagnosis: Muscle weakness (generalized)  Other abnormalities of gait and mobility  Chronic bilateral low back pain without sciatica  Radiculopathy, lumbar region     Problem List Patient Active Problem List   Diagnosis Date Noted  . Gastroesophageal reflux disease without esophagitis 06/19/2017  . Need for hepatitis A vaccination 06/19/2017  . 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 Routine screening for STI (sexually transmitted infection) 06/07/2016  . SVT (supraventricular tachycardia) ([place]) 10/08/2014  . Depression 10/08/2014  . Atypical nevi 06/22/2014  . Elevated triglycerides with high cholesterol 09/06/2013  . Anxiety state 08/03/2013  . Essential hypertension, benign 08/03/2013  . Environmental allergies 03/23/2013  . Visit for preventive health examination 03/23/2013  . Meniere disease 03/23/2013  . Intrinsic asthma     Minna Merritts SPT 07/21/2019, 4:57 PM  [facility] [address] [place], [place], [address] Phone: [phone]   Fax:  [phone]  Name: Steve Jacobs MRN: [id number] Date of Birth: September 27, 1985

## 2019-07-23 ENCOUNTER — Ambulatory Visit: Payer: PRIVATE HEALTH INSURANCE | Admitting: Physical Therapy

## 2019-07-23 MED FILL — traMADol HCL 50 MG TABS: 50 | 7 days supply | Qty: 21 | Fill #0

## 2019-07-26 MED FILL — LORazepam 2 MG TABS: 2 | 20 days supply | Qty: 60 | Fill #1

## 2019-07-26 MED FILL — CYCLOBENZAPRINE HCL 10 MG T: 10 | 45 days supply | Qty: 45 | Fill #0

## 2019-07-30 ENCOUNTER — Other Ambulatory Visit: Payer: Self-pay

## 2019-07-30 ENCOUNTER — Encounter: Payer: Self-pay | Admitting: Physical Therapy

## 2019-07-30 ENCOUNTER — Ambulatory Visit: Payer: PRIVATE HEALTH INSURANCE | Admitting: Physical Therapy

## 2019-07-30 DIAGNOSIS — R2689 Other abnormalities of gait and mobility: Secondary | ICD-10-CM

## 2019-07-30 DIAGNOSIS — M5416 Radiculopathy, lumbar region: Secondary | ICD-10-CM

## 2019-07-30 DIAGNOSIS — M6281 Muscle weakness (generalized): Secondary | ICD-10-CM

## 2019-07-30 DIAGNOSIS — G8929 Other chronic pain: Secondary | ICD-10-CM

## 2019-07-30 DIAGNOSIS — M545 Low back pain, unspecified: Secondary | ICD-10-CM

## 2019-08-02 ENCOUNTER — Other Ambulatory Visit: Payer: Self-pay

## 2019-08-02 ENCOUNTER — Ambulatory Visit: Payer: PRIVATE HEALTH INSURANCE | Attending: Orthopedic Surgery | Admitting: Physical Therapy

## 2019-08-02 DIAGNOSIS — M545 Low back pain: Secondary | ICD-10-CM | POA: Insufficient documentation

## 2019-08-02 DIAGNOSIS — M6281 Muscle weakness (generalized): Secondary | ICD-10-CM | POA: Diagnosis not present

## 2019-08-02 DIAGNOSIS — M5416 Radiculopathy, lumbar region: Secondary | ICD-10-CM | POA: Insufficient documentation

## 2019-08-02 DIAGNOSIS — G8929 Other chronic pain: Secondary | ICD-10-CM | POA: Insufficient documentation

## 2019-08-02 DIAGNOSIS — R2689 Other abnormalities of gait and mobility: Secondary | ICD-10-CM | POA: Diagnosis present

## 2019-08-02 NOTE — Therapy (Signed)
[place], [place], [address] Phone: [phone]   Fax:  [phone]  Physical Therapy Treatment and Re-Certification  Patient Details  Name: Steve Jacobs MRN: [id number] Date of Birth: [birth year] Referring Provider (PT): Odelia Gage [facility]    Encounter Date: 08/02/2019   PT End of Session - 08/02/19 1543    Visit Number 22    Number of Visits 35    Date for PT Re-Evaluation 09/13/19    PT Start Time 7591    PT Stop Time 1610    PT Time Calculation (min) 39 min    Activity Tolerance Patient tolerated treatment well    Behavior During Therapy [facility] for tasks assessed/performed           Past Medical History:  Diagnosis Date  . Asthma   . Chicken pox   . Depression    Resolved  . Environmental and seasonal allergies   . Essential hypertension, benign 08/03/2013  . Hyperlipidemia   . Meniere's disease   . Thoracic outlet syndrome     Past Surgical History:  Procedure Laterality Date  . Nevus Biopsy      There were no vitals filed for this visit.   Subjective Assessment - 08/02/19 1535    Subjective Pt states this is his first day back at work. Pt reports he did not feel too sore after last session. Pt states he talked with his case worker who reports that he is approved for 12 more visits if needed.    Limitations Standing;Walking    How long can you stand comfortably? As the day goes on he has more difficulty standing    How long can you walk comfortably? limited didstances    Diagnostic tests Psot-op solid fusion    Patient Stated Goals to go back to work    Currently in Pain? Yes    Pain Score 2     Pain Location Back    Pain Descriptors / Indicators Tightness    Pain Type Chronic pain    Pain Onset More than a month ago                             [facility] Adult PT Treatment/Exercise - 08/02/19 0001      Lumbar Exercises: Aerobic   Nustep L5 x 6 min      Lumbar  Exercises: Machines for Strengthening   Leg Press total gym leg press 3x10 45#      Lumbar Exercises: Standing   Other Standing Lumbar Exercises SLS 15 sec bilat    Other Standing Lumbar Exercises diagonals x 10 bilat with 7#      Lumbar Exercises: Seated   Other Seated Lumbar Exercises on physioball marching 2x10, on bosu v-sit 2 x10      Shoulder Exercises: Seated   Other Seated Exercises total gym lat pull down 2x10 with 10#                    PT Short Term Goals - 07/30/19 1056      PT SHORT TERM GOAL #1   Title Patient will perfrom log roll without hesitancy and pain    Baseline perfroming log roll without pain    Time 3    Status Achieved    Target Date 05/27/19      PT SHORT TERM GOAL #2   Title Patient will transfer sit to stand  without using hands    Baseline able to tranfer 10x without hands    Time 3    Period Weeks    Status Achieved    Target Date 05/27/19      PT SHORT TERM GOAL #3   Title Patient will ambualte 300' with SPC with good safety and stability    Baseline No device    Time 3    Period Weeks    Status Achieved             PT Long Term Goals - 08/02/19 1608      PT LONG TERM GOAL #1   Title Patient will ambualte 1000' with LRAD with good safety and without increased pain    Baseline ambualted 505' in 6 minutes without device    Time 6    Period Weeks    Status Revised    Target Date 09/13/19      PT LONG TERM GOAL #2   Title Patient will put his shoes on without pain    Baseline is bending more but still using slip pn shoes    Time 6    Period Weeks    Status Revised    Target Date 09/13/19      PT LONG TERM GOAL #3   Title Patient will stand for 1/2 hour without increased pain    Baseline pt able to stand ~20 min for exercises    Time 6    Period Weeks    Status Revised    Target Date 09/13/19      PT LONG TERM GOAL #4   Title Pt to demo increased strength of Bil LEs and core to at least 4+/5 to improve  stability and ability for return to work.    Baseline Strength has improved with all movements    Period Weeks    Status Achieved    Target Date 08/30/19      PT LONG TERM GOAL #5   Title Pt to demo ability for proper lift, squat technique, for at least 25 lb, to improve ability for work duties.    Time 6    Period Weeks    Status Revised    Target Date 09/13/19                 Plan - 08/02/19 1606    Clinical Impression Statement Pt tolerated treatment well and states he has been approved for 12 more visits if needed. Pt presents to clinic in between his 12 hour shift back to work. PT included more machine exercises to begin transitioning pt into community. PT continuing to progress trunk and core strengthening in standing for improving endurance. Pt continues to have difficulty tolerating prolonged standing tasks for a full 12 hour work shift. Pt meeting goals; however, would benefit from continued PT to transition him to full return to work and community exercise programs.    Personal Factors and Comorbidities Comorbidity 1;Comorbidity 2    Comorbidities long standing low back pain, anxiety, depression    Examination-Activity Limitations Bed Mobility;Squat;Lift;Carry;Reach Overhead;Sit    Examination-Participation Restrictions Shop;Community Activity;Cleaning;Laundry    Stability/Clinical Decision Making Evolving/Moderate complexity    Rehab Potential Good    PT Frequency 2x / week    PT Duration 6 weeks    PT Treatment/Interventions ADLs/Self Care Home Management;Cryotherapy;Electrical Stimulation;Traction;Moist Heat;Functional mobility training;Therapeutic activities;Therapeutic exercise;Gait training;DME Instruction;Neuromuscular re-education;Patient/family education;Manual techniques;Passive range of motion;Taping    PT Next Visit Plan Continue strengthening, progress balance/proprioception, progress functional  lifting for work tasks, progress pt's endurance with standing.     PT Home Exercise Plan row, shoulder extension, standing march, standing weight shift; seated bilateral er; seated horizontal abduction;    Consulted and Agree with Plan of Care Patient           Patient will benefit from skilled therapeutic intervention in order to improve the following deficits and impairments:  Abnormal gait, Decreased activity tolerance, Pain, Increased fascial restricitons, Decreased strength, Decreased range of motion, Decreased endurance, Difficulty walking  Visit Diagnosis: Muscle weakness (generalized)  Other abnormalities of gait and mobility  Chronic bilateral low back pain without sciatica  Radiculopathy, lumbar region     Problem List Patient Active Problem List   Diagnosis Date Noted  . Gastroesophageal reflux disease without esophagitis 06/19/2017  . Need for hepatitis A vaccination 06/19/2017  . 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 Routine screening for STI (sexually transmitted infection) 06/07/2016  . SVT (supraventricular tachycardia) ([place]) 10/08/2014  . Depression 10/08/2014  . Atypical nevi 06/22/2014  . Elevated triglycerides with high cholesterol 09/06/2013  . Anxiety state 08/03/2013  . Essential hypertension, benign 08/03/2013  . Environmental allergies 03/23/2013  . Visit for preventive health examination 03/23/2013  . Meniere disease 03/23/2013  . Intrinsic asthma     Citlalic Norlander April Ma L Keelon Zurn PT, DPT 08/02/2019, 10:12 PM  [facility] [address] [place], [place], [address] Phone: [phone]   Fax:  [phone]  Name: PRINSTON KYNARD MRN: [id number] Date of Birth: 1985/04/28

## 2019-08-04 ENCOUNTER — Other Ambulatory Visit: Payer: Self-pay | Admitting: Physician Assistant

## 2019-08-04 MED FILL — LEVOCETIRIZINE 5 MG TABLET: 5 | 30 days supply | Qty: 30 | Fill #3

## 2019-08-04 MED FILL — TRIAMTERENE/HCTZ 37.5/25 CP: 37.5-25 | 90 days supply | Qty: 90 | Fill #0

## 2019-08-05 ENCOUNTER — Ambulatory Visit: Payer: PRIVATE HEALTH INSURANCE | Admitting: Physical Therapy

## 2019-08-09 ENCOUNTER — Ambulatory Visit: Payer: PRIVATE HEALTH INSURANCE | Admitting: Physical Therapy

## 2019-08-09 ENCOUNTER — Other Ambulatory Visit: Payer: Self-pay

## 2019-08-09 ENCOUNTER — Encounter: Payer: Self-pay | Admitting: Physical Therapy

## 2019-08-09 DIAGNOSIS — M6281 Muscle weakness (generalized): Secondary | ICD-10-CM

## 2019-08-09 DIAGNOSIS — M545 Low back pain, unspecified: Secondary | ICD-10-CM

## 2019-08-09 DIAGNOSIS — M5416 Radiculopathy, lumbar region: Secondary | ICD-10-CM

## 2019-08-09 DIAGNOSIS — R2689 Other abnormalities of gait and mobility: Secondary | ICD-10-CM

## 2019-08-09 DIAGNOSIS — G8929 Other chronic pain: Secondary | ICD-10-CM

## 2019-08-09 NOTE — Therapy (Signed)
[place], [place], [address] Phone: [phone]   Fax:  [phone]  Physical Therapy Treatment  Patient Details  Name: Steve Jacobs MRN: [id number] Date of Birth: 1985/04/19 Referring Provider (PT): Odelia Gage [facility]    Encounter Date: 07/30/2019   PT End of Session - 08/09/19 0848    Visit Number 21    Number of Visits 23    Date for PT Re-Evaluation 07/28/19    PT Start Time 1021   Patient 6 minutes late   PT Stop Time 1100    PT Time Calculation (min) 39 min    Activity Tolerance Patient tolerated treatment well    Behavior During Therapy [facility] for tasks assessed/performed           Past Medical History:  Diagnosis Date   Asthma    Chicken pox    Depression    Resolved   Environmental and seasonal allergies    Essential hypertension, benign 08/03/2013   Hyperlipidemia    Meniere's disease    Thoracic outlet syndrome     Past Surgical History:  Procedure Laterality Date   Nevus Biopsy      There were no vitals filed for this visit.   Subjective Assessment - 08/09/19 0847    Subjective Patient reports he is feeling really good today. The last two days he has "felt like he has a normal spine". 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 He is not wearing the brace at all. Still has pain when he bends forward.    Limitations Standing;Walking    How long can you stand comfortably? As the day goes on he has more difficulty standing    How long can you walk comfortably? limited didstances    Diagnostic tests Psot-op solid fusion    Patient Stated Goals to go back to work    Currently in Pain? Yes    Pain Score 2     Pain Location Back    Pain Orientation Right;Left    Pain Descriptors / Indicators Aching    Pain Type Chronic pain    Pain Radiating Towards radiatinginto the right buttock    Pain Onset More than a month ago    Pain Frequency Intermittent    Aggravating Factors   bending and sitting    Pain Relieving Factors rest and stretches                                     PT Education - 08/09/19 0848    Education Details reviewed tehcnique with exercises    Person(s) Educated Patient    Methods Explanation;Demonstration;Tactile cues;Verbal cues    Comprehension Verbalized understanding;Returned demonstration;Verbal cues required;Tactile cues required            PT Short Term Goals - 07/30/19 1056      PT SHORT TERM GOAL #1   Title Patient will perfrom log roll without hesitancy and pain    Baseline perfroming log roll without pain    Time 3    Status Achieved    Target Date 05/27/19      PT SHORT TERM GOAL #2   Title Patient will transfer sit to stand without using hands    Baseline able to tranfer 10x without hands    Time 3    Period Weeks    Status Achieved    Target  Date 05/27/19      PT SHORT TERM GOAL #3   Title Patient will ambualte 37' with SPC with good safety and stability    Baseline No device    Time 3    Period Weeks    Status Achieved             PT Long Term Goals - 08/02/19 1608      PT LONG TERM GOAL #1   Title Patient will ambualte 1000' with LRAD with good safety and without increased pain    Baseline ambualted 505' in 6 minutes without device    Time 6    Period Weeks    Status Revised    Target Date 09/13/19      PT LONG TERM GOAL #2   Title Patient will put his shoes on without pain    Baseline is bending more but still using slip pn shoes    Time 6    Period Weeks    Status Revised    Target Date 09/13/19      PT LONG TERM GOAL #3   Title Patient will stand for 1/2 hour without increased pain    Baseline pt able to stand ~20 min for exercises    Time 6    Period Weeks    Status Revised    Target Date 09/13/19      PT LONG TERM GOAL #4   Title Pt to demo increased strength of Bil LEs and core to at least 4+/5 to improve stability and ability for return to work.     Baseline Strength has improved with all movements    Period Weeks    Status Achieved    Target Date 08/30/19      PT LONG TERM GOAL #5   Title Pt to demo ability for proper lift, squat technique, for at least 25 lb, to improve ability for work duties.    Time 6    Period Weeks    Status Revised    Target Date 09/13/19                 Plan - 08/09/19 0853    Clinical Impression Statement Patient tolerated tresatment well. he has not had much pain the last two days. He noted during treatment that his right hip is much tighter then his left. Therapy added in the leg press machine. Her reported it felf good. He was alos able to complete suqats with kettle bell. He could feel it in his knees but he was able to complete. Therapy will continue to progress functional strength as tolerated. Therapy will re-assess and recertify next visit.    Personal Factors and Comorbidities Comorbidity 1;Comorbidity 2    Comorbidities long standing low back pain, anxiety, depression    Examination-Activity Limitations Bed Mobility;Squat;Lift;Carry;Reach Overhead;Sit    Examination-Participation Restrictions Shop;Community Activity;Cleaning;Laundry    Stability/Clinical Decision Making Evolving/Moderate complexity    Rehab Potential Good    PT Frequency 2x / week    PT Duration 6 weeks    PT Treatment/Interventions ADLs/Self Care Home Management;Cryotherapy;Electrical Stimulation;Traction;Moist Heat;Functional mobility training;Therapeutic activities;Therapeutic exercise;Gait training;DME Instruction;Neuromuscular re-education;Patient/family education;Manual techniques;Passive range of motion;Taping    PT Next Visit Plan Continue general strengthening, endurance program, and continuing to progress balance/proprioception. Review HEP. Consider functional strengthening exercises for UE and LE.    PT Home Exercise Plan row, shoulder extension, standing march, standing weight shift; seated bilateral er; seated  horizontal abduction;    Consulted and Agree with  Plan of Care Patient           Patient will benefit from skilled therapeutic intervention in order to improve the following deficits and impairments:  Abnormal gait, Decreased activity tolerance, Pain, Increased fascial restricitons, Decreased strength, Decreased range of motion, Decreased endurance, Difficulty walking  Visit Diagnosis: Muscle weakness (generalized)  Other abnormalities of gait and mobility  Chronic bilateral low back pain without sciatica  Radiculopathy, lumbar region     Problem List Patient Active Problem List   Diagnosis Date Noted   Gastroesophageal reflux disease without esophagitis 06/19/2017   Need for hepatitis A vaccination 06/19/2017   Primary insomnia 02/02/2017   Routine screening for STI (sexually transmitted infection) 06/07/2016   SVT (supraventricular tachycardia) ([place]) 10/08/2014   Depression 10/08/2014   Atypical nevi 06/22/2014   Elevated triglycerides with high cholesterol 09/06/2013   Anxiety state 08/03/2013   Essential hypertension, benign 08/03/2013   Environmental allergies 03/23/2013   Visit for preventive health examination 03/23/2013   Meniere disease 03/23/2013   Intrinsic asthma     Carney Living PT DPT  08/09/2019, 8:54 AM   Minna Merritts 07/30/2019  During this treatment session, the therapist was present, participating in and directing the treatment.   [place], [place], [address] Phone: [phone]   Fax:  [phone]  Name: Steve Jacobs MRN: [id number] Date of Birth: 1985-07-06

## 2019-08-10 NOTE — Therapy (Signed)
[place], [place], [address] Phone: [phone]   Fax:  [phone]  Physical Therapy Treatment  Patient Details  Name: Steve Jacobs MRN: [id number] Date of Birth: 1985-05-29 Referring Provider (PT): Odelia Gage [facility]    Encounter Date: 08/09/2019   PT End of Session - 08/09/19 1610    Visit Number 22    Number of Visits 23    Date for PT Re-Evaluation 07/28/19    PT Start Time 6568    PT Stop Time 1628    PT Time Calculation (min) 41 min    Activity Tolerance Patient tolerated treatment well    Behavior During Therapy [facility] for tasks assessed/performed           Past Medical History:  Diagnosis Date  . Asthma   . Chicken pox   . Depression    Resolved  . Environmental and seasonal allergies   . Essential hypertension, benign 08/03/2013  . Hyperlipidemia   . Meniere's disease   . Thoracic outlet syndrome     Past Surgical History:  Procedure Laterality Date  . Nevus Biopsy      There were no vitals filed for this visit.   Subjective Assessment - 08/09/19 1608    Subjective Patient reports he is feeling good today. He went back to work last week and had little pain, mostly stiffness.    Limitations Standing;Walking    How long can you stand comfortably? As the day goes on he has more difficulty standing    How long can you walk comfortably? limited didstances    Diagnostic tests Psot-op solid fusion    Currently in Pain? Yes    Pain Score 1     Pain Location Back    Pain Orientation Right;Left    Pain Descriptors / Indicators Aching    Pain Type Chronic pain    Pain Radiating Towards radiating into the right buttock    Pain Onset More than a month ago    Pain Frequency Intermittent    Aggravating Factors  bending and sitting    Pain Relieving Factors rest and stetches                             [facility] Adult PT Treatment/Exercise - 08/10/19 0001      Lumbar  Exercises: Stretches   Lower Trunk Rotation Limitations x20    Piriformis Stretch 3 reps;30 seconds;Right;Left      Lumbar Exercises: Aerobic   Nustep L5 x 6 min      Lumbar Exercises: Supine   Ab Set 15 reps;2 seconds    Clam Limitations x30 green band    Bent Knee Raise 20 reps   cuing for tight core     Shoulder Exercises: Seated   Other Seated Exercises marching on physioball x20 each leg   cuing to hold leg up for 2 sec                 PT Education - 08/09/19 1757    Education Details importance of core strengthening    Person(s) Educated Patient    Methods Explanation;Demonstration;Tactile cues;Verbal cues    Comprehension Tactile cues required;Verbal cues required;Verbalized understanding;Returned demonstration            PT Short Term Goals - 07/30/19 1056      PT SHORT TERM GOAL #1   Title Patient will perfrom log roll without hesitancy  and pain    Baseline perfroming log roll without pain    Time 3    Status Achieved    Target Date 05/27/19      PT SHORT TERM GOAL #2   Title Patient will transfer sit to stand without using hands    Baseline able to tranfer 10x without hands    Time 3    Period Weeks    Status Achieved    Target Date 05/27/19      PT SHORT TERM GOAL #3   Title Patient will ambualte 300' with [facility] with good safety and stability    Baseline No device    Time 3    Period Weeks    Status Achieved             PT Long Term Goals - 08/02/19 1608      PT LONG TERM GOAL #1   Title Patient will ambualte 1000' with LRAD with good safety and without increased pain    Baseline ambualted 505' in 6 minutes without device    Time 6    Period Weeks    Status Revised    Target Date 09/13/19      PT LONG TERM GOAL #2   Title Patient will put his shoes on without pain    Baseline is bending more but still using slip pn shoes    Time 6    Period Weeks    Status Revised    Target Date 09/13/19      PT LONG TERM GOAL #3   Title  Patient will stand for 1/2 hour without increased pain    Baseline pt able to stand ~20 min for exercises    Time 6    Period Weeks    Status Revised    Target Date 09/13/19      PT LONG TERM GOAL #4   Title Pt to demo increased strength of Bil LEs and core to at least 4+/5 to improve stability and ability for return to work.    Baseline Strength has improved with all movements    Period Weeks    Status Achieved    Target Date 08/30/19      PT LONG TERM GOAL #5   Title Pt to demo ability for proper lift, squat technique, for at least 25 lb, to improve ability for work duties.    Time 6    Period Weeks    Status Revised    Target Date 09/13/19                 Plan - 08/09/19 1758    Clinical Impression Statement Patient went back to work last week and states his pain did not increase, but his back was sore today. Patient has increased hip flexion and lumbar ROM. His hip strength has improved slightly as well. Therapy worked on lighter exercises today due to patient stating his back felt more sore. Focused on core strengthening today to help provide stability for his back. Patient demonstrates improvement and will continue to benefit from skilled therapy to increase his lumbar ROM and hip/glute strength.    Personal Factors and Comorbidities Comorbidity 1;Comorbidity 2    Comorbidities long standing low back pain, anxiety, depression    Examination-Activity Limitations Bed Mobility;Squat;Lift;Carry;Reach Overhead;Sit    Examination-Participation Restrictions Shop;Community Activity;Cleaning;Laundry    Stability/Clinical Decision Making Evolving/Moderate complexity    Clinical Decision Making Moderate    Rehab Potential Good    PT Frequency 2x /  week    PT Duration 6 weeks    PT Treatment/Interventions ADLs/Self Care Home Management;Cryotherapy;Electrical Stimulation;Traction;Moist Heat;Functional mobility training;Therapeutic activities;Therapeutic exercise;Gait training;DME  Instruction;Neuromuscular re-education;Patient/family education;Manual techniques;Passive range of motion;Taping    PT Next Visit Plan Continue general strengthening, endurance program, and continuing to progress balance/proprioception. Review HEP. Consider functional strengthening exercises for UE and LE.    PT Home Exercise Plan row, shoulder extension, standing march, standing weight shift; seated bilateral er; seated horizontal abduction;    Consulted and Agree with Plan of Care Patient           Patient will benefit from skilled therapeutic intervention in order to improve the following deficits and impairments:  Abnormal gait, Decreased activity tolerance, Pain, Increased fascial restricitons, Decreased strength, Decreased range of motion, Decreased endurance, Difficulty walking  Visit Diagnosis: Muscle weakness (generalized)  Other abnormalities of gait and mobility  Chronic bilateral low back pain without sciatica  Radiculopathy, lumbar region     Problem List Patient Active Problem List   Diagnosis Date Noted  . Gastroesophageal reflux disease without esophagitis 06/19/2017  . Need for hepatitis A vaccination 06/19/2017  . 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 Routine screening for STI (sexually transmitted infection) 06/07/2016  . SVT (supraventricular tachycardia) ([place]) 10/08/2014  . Depression 10/08/2014  . Atypical nevi 06/22/2014  . Elevated triglycerides with high cholesterol 09/06/2013  . Anxiety state 08/03/2013  . Essential hypertension, benign 08/03/2013  . Environmental allergies 03/23/2013  . Visit for preventive health examination 03/23/2013  . Meniere disease 03/23/2013  . Intrinsic asthma     Carney Living PT DPT  08/10/2019, 9:32 AM   Minna Merritts SPT 08/10/2019  During this treatment session, the therapist was present, participating in and directing the treatment.   [place], [place], [address] Phone: [phone]   Fax:  [phone]  Name: ZAMIR STAPLES MRN: [id number] Date of Birth: September 24, 1985

## 2019-08-11 ENCOUNTER — Encounter: Payer: Self-pay | Admitting: Physician Assistant

## 2019-08-11 ENCOUNTER — Telehealth: Payer: Self-pay | Admitting: Physical Therapy

## 2019-08-11 ENCOUNTER — Ambulatory Visit: Payer: PRIVATE HEALTH INSURANCE | Admitting: Physical Therapy

## 2019-08-11 NOTE — Telephone Encounter (Signed)
Patient contacted regaurding No-show appointment. There was no answer so therapy left a voicemail. The patient was reminded of his next visit and advised to call if he was not going to make it.

## 2019-08-17 ENCOUNTER — Ambulatory Visit: Payer: PRIVATE HEALTH INSURANCE | Attending: Physician Assistant | Admitting: Physical Therapy

## 2019-08-17 ENCOUNTER — Other Ambulatory Visit: Payer: Self-pay

## 2019-08-17 DIAGNOSIS — G8929 Other chronic pain: Secondary | ICD-10-CM | POA: Insufficient documentation

## 2019-08-17 DIAGNOSIS — R2689 Other abnormalities of gait and mobility: Secondary | ICD-10-CM | POA: Insufficient documentation

## 2019-08-17 DIAGNOSIS — M545 Low back pain: Secondary | ICD-10-CM | POA: Insufficient documentation

## 2019-08-17 DIAGNOSIS — M5416 Radiculopathy, lumbar region: Secondary | ICD-10-CM | POA: Diagnosis present

## 2019-08-17 DIAGNOSIS — M6281 Muscle weakness (generalized): Secondary | ICD-10-CM | POA: Diagnosis present

## 2019-08-18 ENCOUNTER — Encounter: Payer: Self-pay | Admitting: Physical Therapy

## 2019-08-18 NOTE — Therapy (Signed)
[place], [place], [address] Phone: [phone]   Fax:  [phone]  Physical Therapy Treatment  Patient Details  Name: Steve Jacobs MRN: [id number] Date of Birth: 14-Aug-1985 Referring Provider (PT): Odelia Gage [facility]    Encounter Date: 08/17/2019   PT End of Session - 08/17/19 1547    Visit Number 23    Number of Visits 23    Date for PT Re-Evaluation 07/28/19    PT Start Time 4401    PT Stop Time 1627    PT Time Calculation (min) 40 min    Activity Tolerance Patient tolerated treatment well    Behavior During Therapy [facility] for tasks assessed/performed           Past Medical History:  Diagnosis Date  . Asthma   . Chicken pox   . Depression    Resolved  . Environmental and seasonal allergies   . Essential hypertension, benign 08/03/2013  . Hyperlipidemia   . Meniere's disease   . Thoracic outlet syndrome     Past Surgical History:  Procedure Laterality Date  . Nevus Biopsy      There were no vitals filed for this visit.   Subjective Assessment - 08/17/19 1549    Subjective Pt reports he had one bad day last Saturday, had leg pain and could not go into work. Since then pain has been about 3-4/10 and today he has no pain.    Limitations Standing;Walking    How long can you stand comfortably? As the day goes on he has more difficulty standing    How long can you walk comfortably? limited distances    Diagnostic tests Psot-op solid fusion    Patient Stated Goals to go back to work    Currently in Pain? No/denies    Pain Score 0-No pain    Pain Location Back    Pain Orientation Left;Right    Pain Descriptors / Indicators Aching    Pain Type Chronic pain    Pain Onset More than a month ago    Pain Frequency Intermittent    Aggravating Factors  bending and sitting    Pain Relieving Factors rest and stretches                             [facility] Adult PT Treatment/Exercise  - 08/18/19 0001      Lumbar Exercises: Stretches   Lower Trunk Rotation Limitations x20    Piriformis Stretch 3 reps;30 seconds;Right;Left      Lumbar Exercises: Aerobic   Nustep L4 x 5 min      Lumbar Exercises: Standing   Functional Squats 15 reps   10 lbs from stool    Other Standing Lumbar Exercises SLS 15 sec bilat      Lumbar Exercises: Seated   Other Seated Lumbar Exercises Hamstring curls 2x10 each leg   green     Lumbar Exercises: Supine   Clam Limitations x30 green band    Bent Knee Raise 20 reps   cuing for tight core     Knee/Hip Exercises: Standing   Hip Flexion 2 sets;10 reps;Both    Hip Flexion Limitations Marches    Lateral Step Up 1 set;10 reps;Left;Right    Forward Step Up 1 set;10 reps;Left;Right    SLS 1x15 sec on R and L; 1x30 sec on R&L       Modalities   Modalities Moist Heat  Moist Heat Therapy   Moist Heat Location Lumbar Spine                  PT Education - 08/18/19 1200    Education Details posture with ther-ex    Person(s) Educated Patient    Methods Explanation;Demonstration;Tactile cues;Verbal cues    Comprehension Verbalized understanding;Verbal cues required;Returned demonstration;Tactile cues required            PT Short Term Goals - 07/30/19 1056      PT SHORT TERM GOAL #1   Title Patient will perfrom log roll without hesitancy and pain    Baseline perfroming log roll without pain    Time 3    Status Achieved    Target Date 05/27/19      PT SHORT TERM GOAL #2   Title Patient will transfer sit to stand without using hands    Baseline able to tranfer 10x without hands    Time 3    Period Weeks    Status Achieved    Target Date 05/27/19      PT SHORT TERM GOAL #3   Title Patient will ambualte 300' with [facility] with good safety and stability    Baseline No device    Time 3    Period Weeks    Status Achieved             PT Long Term Goals - 08/02/19 1608      PT LONG TERM GOAL #1   Title Patient will  ambualte 1000' with LRAD with good safety and without increased pain    Baseline ambualted 505' in 6 minutes without device    Time 6    Period Weeks    Status Revised    Target Date 09/13/19      PT LONG TERM GOAL #2   Title Patient will put his shoes on without pain    Baseline is bending more but still using slip pn shoes    Time 6    Period Weeks    Status Revised    Target Date 09/13/19      PT LONG TERM GOAL #3   Title Patient will stand for 1/2 hour without increased pain    Baseline pt able to stand ~20 min for exercises    Time 6    Period Weeks    Status Revised    Target Date 09/13/19      PT LONG TERM GOAL #4   Title Pt to demo increased strength of Bil LEs and core to at least 4+/5 to improve stability and ability for return to work.    Baseline Strength has improved with all movements    Period Weeks    Status Achieved    Target Date 08/30/19      PT LONG TERM GOAL #5   Title Pt to demo ability for proper lift, squat technique, for at least 25 lb, to improve ability for work duties.    Time 6    Period Weeks    Status Revised    Target Date 09/13/19                 Plan - 08/17/19 1635    Clinical Impression Statement Pt had no pain at the beginnig of session today, but had pain over the last few days and continues to have muscle tightness in his lower back. Therapy continues to work on hip and glute strengthening exercises and patient is tolerating progressions  well. Therapy continues to focus on core strengthening to provide stability for his back. Therapy will continue progressing strength exercises as tolerated by  patient and manage symptoms as he is back at work.    Personal Factors and Comorbidities Comorbidity 1;Comorbidity 2    Comorbidities long standing low back pain, anxiety, depression    Examination-Activity Limitations Bed Mobility;Squat;Lift;Carry;Reach Overhead;Sit    Examination-Participation Restrictions Shop;Community  Activity;Cleaning;Laundry    Stability/Clinical Decision Making Evolving/Moderate complexity    Clinical Decision Making Moderate    Rehab Potential Good    PT Frequency 2x / week    PT Duration 6 weeks    PT Treatment/Interventions ADLs/Self Care Home Management;Cryotherapy;Electrical Stimulation;Traction;Moist Heat;Functional mobility training;Therapeutic activities;Therapeutic exercise;Gait training;DME Instruction;Neuromuscular re-education;Patient/family education;Manual techniques;Passive range of motion;Taping    PT Next Visit Plan Continue general strengthening, endurance program, and continuing to progress balance/proprioception. Review HEP. Consider functional strengthening exercises for UE and LE.    PT Home Exercise Plan row, shoulder extension, standing march, standing weight shift; seated bilateral er; seated horizontal abduction;    Consulted and Agree with Plan of Care Patient           Patient will benefit from skilled therapeutic intervention in order to improve the following deficits and impairments:  Abnormal gait, Decreased activity tolerance, Pain, Increased fascial restricitons, Decreased strength, Decreased range of motion, Decreased endurance, Difficulty walking  Visit Diagnosis: Muscle weakness (generalized)  Other abnormalities of gait and mobility  Chronic bilateral low back pain without sciatica  Radiculopathy, lumbar region     Problem List Patient Active Problem List   Diagnosis Date Noted  . Gastroesophageal reflux disease without esophagitis 06/19/2017  . Need for hepatitis A vaccination 06/19/2017  . 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 Routine screening for STI (sexually transmitted infection) 06/07/2016  . SVT (supraventricular tachycardia) ([place]) 10/08/2014  . Depression 10/08/2014  . Atypical nevi 06/22/2014  . Elevated triglycerides with high cholesterol 09/06/2013  . Anxiety state 08/03/2013  . Essential hypertension, benign 08/03/2013  .  Environmental allergies 03/23/2013  . Visit for preventive health examination 03/23/2013  . Meniere disease 03/23/2013  . Intrinsic asthma     Carney Living PT DPT  08/18/2019, 12:01 PM   Minna Merritts SPT  08/18/2019   During this treatment session, the therapist was present, participating in and directing the treatment.   [place], [place], [address] Phone: [phone]   Fax:  [phone]  Name: CEZAR MISIASZEK MRN: [id number] Date of Birth: 02/25/1985

## 2019-08-19 ENCOUNTER — Encounter: Payer: Self-pay | Admitting: Physician Assistant

## 2019-08-19 ENCOUNTER — Other Ambulatory Visit: Payer: Self-pay | Admitting: Physician Assistant

## 2019-08-19 DIAGNOSIS — I471 Supraventricular tachycardia: Secondary | ICD-10-CM

## 2019-08-19 DIAGNOSIS — F411 Generalized anxiety disorder: Secondary | ICD-10-CM

## 2019-08-19 MED FILL — METOPROLOL SUCCINATE ER 50: 50 | 90 days supply | Qty: 90 | Fill #0

## 2019-08-19 MED FILL — LORazepam 2 MG TABS: 2 | 20 days supply | Qty: 60 | Fill #0

## 2019-08-19 MED FILL — FINASTERIDE 1 MG TABLET: 1 | 90 days supply | Qty: 90 | Fill #0

## 2019-08-20 MED FILL — ALBUTEROL SULFATE HFA 108 (: 108 (90 BAS | 75 days supply | Qty: 54 | Fill #1

## 2019-08-20 NOTE — Telephone Encounter (Signed)
Patient should schedule visit to discuss.

## 2019-08-23 ENCOUNTER — Encounter: Payer: Self-pay | Admitting: Physician Assistant

## 2019-08-23 ENCOUNTER — Telehealth (INDEPENDENT_AMBULATORY_CARE_PROVIDER_SITE_OTHER): Payer: No Typology Code available for payment source | Admitting: Physician Assistant

## 2019-08-23 ENCOUNTER — Other Ambulatory Visit: Payer: Self-pay

## 2019-08-23 ENCOUNTER — Telehealth: Payer: No Typology Code available for payment source | Admitting: Physician Assistant

## 2019-08-23 VITALS — Ht 68.0 in | Wt 207.0 lb

## 2019-08-23 DIAGNOSIS — F411 Generalized anxiety disorder: Secondary | ICD-10-CM | POA: Diagnosis not present

## 2019-08-23 DIAGNOSIS — M5417 Radiculopathy, lumbosacral region: Secondary | ICD-10-CM | POA: Insufficient documentation

## 2019-08-23 MED ORDER — PAROXETINE HCL 20 MG PO TABS: 20.0000 mg | ORAL_TABLET | Freq: Every day | ORAL | 3 refills | Status: DC

## 2019-08-23 MED FILL — PARoxetine HCL 20 MG TABS: 20 | 30 days supply | Qty: 30 | Fill #0

## 2019-08-23 MED FILL — METHYLPREDNISOLONE 4 MG TAB: 4 | 6 days supply | Qty: 21 | Fill #0

## 2019-08-23 NOTE — Progress Notes (Signed)
I have discussed the procedure for the virtual visit with the patient who has given consent to proceed with assessment and treatment.   Nelli Swalley S Cross [place], CMA     

## 2019-08-23 NOTE — Progress Notes (Signed)
Virtual Visit via Video   I connected with patient on 08/23/19 at  1:30 PM EDT by a video enabled telemedicine application and verified that I am speaking with the correct person using two identifiers.  Location patient: Home Location provider: [facility] [place], Office Persons participating in the virtual visit: Patient, Provider, [place] (Patina Moore)  I discussed the limitations of evaluation and management by telemedicine and the availability of in person appointments. The patient expressed understanding and agreed to proceed.  Subjective:   HPI:   Patient presents via [place] today to discuss anxiety symptoms. Notes deterioration in anxiety symptoms over the past few months. Denies depressed mood or anhedonia. Notes significant stressors with work ( in the middle of a worker's comp claim after injuring his back at work), financial stressors and familial stressors. Notes he will have episodes of significant increased anxiety where he will note chest tightness, tension all over his body and occasional tingling of extremities. Notes he will take an Ativan which will help calm him out of the acute episode but still having such increased levels of overall anxiety. . Is taking his Lexapro as directed but notes still having to increase frequency of PRN Ativan.   ROS:   See pertinent positives and negatives per HPI.  Patient Active Problem List   Diagnosis Date Noted  . Gastroesophageal reflux disease without esophagitis 06/19/2017  . Need for hepatitis A vaccination 06/19/2017  . 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 Routine screening for STI (sexually transmitted infection) 06/07/2016  . SVT (supraventricular tachycardia) ([place]) 10/08/2014  . Depression 10/08/2014  . Atypical nevi 06/22/2014  . Elevated triglycerides with high cholesterol 09/06/2013  . Anxiety state 08/03/2013  . Essential hypertension, benign 08/03/2013  . Environmental allergies 03/23/2013  . Visit for preventive  health examination 03/23/2013  . Meniere disease 03/23/2013  . Intrinsic asthma     Social History   Tobacco Use  . Smoking status: Never Smoker  . Smokeless tobacco: Never Used  Substance Use Topics  . Alcohol use: Yes    Alcohol/week: 0.0 standard drinks    Comment: 2-3 beers nightly    Current Outpatient Medications:  .  albuterol (VENTOLIN HFA) 108 (90 Base) MCG/ACT inhaler, INHALE 2 PUFFS BY MOUTH EVERY 6 HOURS AS NEEDED FOR WHEEZING, Disp: 54 g, Rfl: 1 .  atorvastatin (LIPITOR) 20 MG tablet, TAKE 1 TABLET (20 MG TOTAL) BY MOUTH DAILY., Disp: 90 tablet, Rfl: 0 .  budesonide-formoterol (SYMBICORT) 80-4.5 MCG/ACT inhaler, Inhale 2 puffs into the lungs 2 (two) times daily., Disp: 1 Inhaler, Rfl: 6 .  diphenhydrAMINE (BENADRYL) 25 MG tablet, Take 25 mg by mouth every 6 (six) hours as needed for itching or allergies., Disp: , Rfl:  .  escitalopram (LEXAPRO) 20 MG tablet, TAKE 1 TABLET (20 MG TOTAL) BY MOUTH DAILY., Disp: 90 tablet, Rfl: 1 .  fenofibrate (TRICOR) 145 MG tablet, TAKE 1 TABLET (145 MG TOTAL) BY MOUTH DAILY., Disp: 90 tablet, Rfl: 0 .  finasteride (PROPECIA) 1 MG tablet, TAKE 1 TABLET (1 MG TOTAL) BY MOUTH DAILY., Disp: 90 tablet, Rfl: 0 .  fluticasone (FLONASE) 50 MCG/ACT nasal spray, Place 2 sprays into both nostrils daily., Disp: 16 g, Rfl: 3 .  levocetirizine (XYZAL) 5 MG tablet, TAKE 1 TABLET BY MOUTH EVERY EVENING., Disp: 30 tablet, Rfl: 3 .  LORazepam (ATIVAN) 2 MG tablet, TAKE 1 TABLET (2 MG TOTAL) BY MOUTH EVERY 8 HOURS AS NEEDED FOR ANXIETY, Disp: 60 tablet, Rfl: 1 .  methocarbamol (ROBAXIN) 750 MG  tablet, Take 750 mg by mouth 4 (four) times daily., Disp: , Rfl:  .  metoprolol succinate (TOPROL-XL) 50 MG 24 hr tablet, TAKE 1 TABLET (50 MG TOTAL) BY MOUTH DAILY., Disp: 90 tablet, Rfl: 0 .  montelukast (SINGULAIR) 10 MG tablet, TAKE 1 TABLET (10 MG TOTAL) BY MOUTH AT BEDTIME., Disp: 90 tablet, Rfl: 1 .  Multiple Vitamin (MULTIVITAMIN WITH MINERALS) TABS tablet, Take  1 tablet by mouth daily., Disp: , Rfl:  .  omeprazole (PRILOSEC) 20 MG capsule, TAKE 1 CAPSULE (20 MG TOTAL) BY MOUTH DAILY., Disp: 90 capsule, Rfl: 1 .  oxyCODONE-acetaminophen (PERCOCET/ROXICET) 5-325 MG tablet, Take 1 tablet by mouth every 4 (four) hours as needed., Disp: , Rfl:  .  pregabalin (LYRICA) 150 MG capsule, Take 150 mg by mouth 2 (two) times daily., Disp: , Rfl:  .  promethazine (PHENERGAN) 25 MG tablet, TAKE 1 TABLET BY MOUTH EVERY 8 HOURS AS NEEDED FOR NAUSEA OR VOMITING, Disp: 20 tablet, Rfl: 0 .  triamterene-hydrochlorothiazide (DYAZIDE) 37.5-25 MG capsule, TAKE 1 CAPSULE BY MOUTH DAILY., Disp: 90 capsule, Rfl: 1 .  VASCEPA 1 g CAPS, TAKE 2 CAPSULES BY MOUTH TWICE A DAY, Disp: 360 capsule, Rfl: 1 .  zolpidem (AMBIEN) 5 MG tablet, TAKE 1 TABLET (5 MG TOTAL) BY MOUTH AT BEDTIME AS NEEDED FOR SLEEP, Disp: 30 tablet, Rfl: 0  Allergies  Allergen Reactions  . Augmentin [Amoxicillin-Pot Clavulanate]   . Belsomra [Suvorexant] Other (See Comments)    nightmares  . Percocet [Oxycodone-Acetaminophen] Nausea And Vomiting    Objective:   There were no vitals taken for this visit.  Patient is well-developed, well-nourished in no acute distress.  Resting comfortably at home.  Head is normocephalic, atraumatic.  No labored breathing.  Speech is clear and coherent with logical content.  Patient is alert and oriented at baseline.   Assessment and Plan:   1. Anxiety state Deteriorated recently due to significant stressors.  We will switch Lexapro to Paxil 20 mg daily.  Continue Ativan as needed.  Recommended counseling.  Contact information given for this.  Close follow-up scheduled.  Patient voiced understanding and agreement with plan. - PARoxetine (PAXIL) 20 MG tablet; Take 1 tablet (20 mg total) by mouth daily.  Dispense: 30 tablet; Refill: 3 .   Leeanne Rio, PA-C 08/23/2019

## 2019-08-23 NOTE — Patient Instructions (Signed)
Instructions sent to MyChart

## 2019-08-24 ENCOUNTER — Ambulatory Visit: Payer: PRIVATE HEALTH INSURANCE | Admitting: Physical Therapy

## 2019-08-26 ENCOUNTER — Ambulatory Visit: Payer: PRIVATE HEALTH INSURANCE | Admitting: Physical Therapy

## 2019-08-31 ENCOUNTER — Ambulatory Visit: Payer: PRIVATE HEALTH INSURANCE | Admitting: Physical Therapy

## 2019-09-02 MED FILL — METHOCARBAMOL 750 MG TABS: 750 | 20 days supply | Qty: 40 | Fill #0

## 2019-09-02 MED FILL — PREGABALIN 150 MG CAPS: 150 | 20 days supply | Qty: 40 | Fill #0

## 2019-09-06 ENCOUNTER — Other Ambulatory Visit: Payer: Self-pay | Admitting: Physician Assistant

## 2019-09-06 MED FILL — CYCLOBENZAPRINE HCL 10 MG T: 10 | 30 days supply | Qty: 30 | Fill #0

## 2019-09-06 MED FILL — LEVOCETIRIZINE 5 MG TABLET: 5 | 30 days supply | Qty: 30 | Fill #0

## 2019-09-06 MED FILL — ATORVASTATIN 20 MG TABLET: 20 | 90 days supply | Qty: 90 | Fill #0

## 2019-09-07 ENCOUNTER — Encounter: Payer: No Typology Code available for payment source | Admitting: Physical Therapy

## 2019-09-08 MED FILL — HYDROCODON-APAP 5-325: 5-325 | 7 days supply | Qty: 21 | Fill #0

## 2019-09-10 ENCOUNTER — Other Ambulatory Visit: Payer: Self-pay | Admitting: Orthopedic Surgery

## 2019-09-10 DIAGNOSIS — M4307 Spondylolysis, lumbosacral region: Secondary | ICD-10-CM

## 2019-09-10 DIAGNOSIS — M4727 Other spondylosis with radiculopathy, lumbosacral region: Secondary | ICD-10-CM

## 2019-09-10 DIAGNOSIS — M5417 Radiculopathy, lumbosacral region: Secondary | ICD-10-CM

## 2019-09-10 DIAGNOSIS — M4317 Spondylolisthesis, lumbosacral region: Secondary | ICD-10-CM

## 2019-09-13 MED FILL — LORazepam 2 MG TABS: 2 | 20 days supply | Qty: 60 | Fill #1

## 2019-09-14 ENCOUNTER — Encounter: Payer: No Typology Code available for payment source | Admitting: Physical Therapy

## 2019-09-16 ENCOUNTER — Other Ambulatory Visit: Payer: Self-pay | Admitting: Physician Assistant

## 2019-09-16 ENCOUNTER — Ambulatory Visit: Payer: PRIVATE HEALTH INSURANCE | Admitting: Physical Therapy

## 2019-09-16 MED ORDER — PAROXETINE HCL 30 MG PO TABS: 30.0000 mg | ORAL_TABLET | Freq: Every day | ORAL | 0 refills | Status: DC

## 2019-09-16 MED FILL — PARoxetine HCL 30 MG TABS: 30 | 90 days supply | Qty: 90 | Fill #0

## 2019-09-18 ENCOUNTER — Ambulatory Visit (HOSPITAL_COMMUNITY)
Admission: RE | Admit: 2019-09-18 | Discharge: 2019-09-18 | Disposition: A | Discharge status: Home / Self Care | Payer: PRIVATE HEALTH INSURANCE | Source: Ambulatory Visit | Attending: Orthopedic Surgery | Admitting: Orthopedic Surgery

## 2019-09-18 ENCOUNTER — Other Ambulatory Visit: Payer: Self-pay

## 2019-09-18 DIAGNOSIS — M4727 Other spondylosis with radiculopathy, lumbosacral region: Secondary | ICD-10-CM | POA: Diagnosis present

## 2019-09-18 DIAGNOSIS — M4317 Spondylolisthesis, lumbosacral region: Secondary | ICD-10-CM | POA: Diagnosis not present

## 2019-09-18 DIAGNOSIS — M4307 Spondylolysis, lumbosacral region: Secondary | ICD-10-CM | POA: Diagnosis present

## 2019-09-18 DIAGNOSIS — M5417 Radiculopathy, lumbosacral region: Secondary | ICD-10-CM | POA: Diagnosis present

## 2019-09-18 MED ORDER — GADOBUTROL 1 MMOL/ML IV SOLN
10.0000 mL | Freq: Once | INTRAVENOUS | Status: AC | PRN
  Administered 2019-09-18: 9 mL via INTRAVENOUS

## 2019-09-21 MED FILL — OMEPRAZOLE DR 20 MG CAPSULE: 20 | 90 days supply | Qty: 90 | Fill #1

## 2019-09-27 MED FILL — PREGABALIN 150 MG CAPS: 150 | 20 days supply | Qty: 40 | Fill #0

## 2019-09-27 MED FILL — METHOCARBAMOL 750 MG TABS: 750 | 20 days supply | Qty: 40 | Fill #0

## 2019-10-04 ENCOUNTER — Other Ambulatory Visit: Payer: Self-pay | Admitting: Physician Assistant

## 2019-10-04 NOTE — Telephone Encounter (Signed)
Lorazepam LFD 08/19/19 #60 with 1 refill LOV 08/23/19 NOV none

## 2019-10-05 ENCOUNTER — Ambulatory Visit: Payer: PRIVATE HEALTH INSURANCE | Attending: Orthopedic Surgery | Admitting: Physical Therapy

## 2019-10-05 ENCOUNTER — Other Ambulatory Visit: Payer: Self-pay

## 2019-10-05 ENCOUNTER — Encounter: Payer: Self-pay | Admitting: Physical Therapy

## 2019-10-05 DIAGNOSIS — M6281 Muscle weakness (generalized): Secondary | ICD-10-CM | POA: Diagnosis present

## 2019-10-05 DIAGNOSIS — G8929 Other chronic pain: Secondary | ICD-10-CM | POA: Insufficient documentation

## 2019-10-05 DIAGNOSIS — M5441 Lumbago with sciatica, right side: Secondary | ICD-10-CM | POA: Diagnosis present

## 2019-10-05 DIAGNOSIS — R2689 Other abnormalities of gait and mobility: Secondary | ICD-10-CM | POA: Diagnosis present

## 2019-10-05 NOTE — Patient Instructions (Signed)
Access Code: [facility] URL: [email or web address] Date: 10/05/2019 Prepared by: Hilda Blades  Exercises Trapp Range Straight Leg Raise - 4 x weekly - 3 sets - 5-10 reps Sidelying Hip Abduction - 4 x weekly - 3 sets - 8-10 reps Prone Hip Extension - Two Pillows - 4 x weekly - 3 sets - 5-10 reps Bird Dog - 4 x weekly - 3 sets - 3 reps - 3 seconds hold Supine March with Alternating Leg Lifts - 1 x daily - 4 x weekly - 3 sets - 5 reps

## 2019-10-05 NOTE — Therapy (Signed)
[place], [place], [address] Phone: [phone]   Fax:  [phone]  Physical Therapy Evaluation  Patient Details  Name: Steve Jacobs MRN: [id number] Date of Birth: [birth year] Referring Provider (PT): Meda Coffee, MD   Encounter Date: 10/05/2019   PT End of Session - 10/05/19 1225    Visit Number 1    Number of Visits 9    Date for PT Re-Evaluation 12/07/19    Authorization Type Worker's comp: 1 evaluation and 8 treatments approved    Authorization - Visit Number --    Authorization - Number of Visits 8    PT Start Time 2706    PT Stop Time 1300    PT Time Calculation (min) 45 min    Activity Tolerance Patient tolerated treatment well    Behavior During Therapy [facility] for tasks assessed/performed           Past Medical History:  Diagnosis Date  . Asthma   . Chicken pox   . Depression    Resolved  . Environmental and seasonal allergies   . Essential hypertension, benign 08/03/2013  . Hyperlipidemia   . Meniere's disease   . Thoracic outlet syndrome     Past Surgical History:  Procedure Laterality Date  . Nevus Biopsy      There were no vitals filed for this visit.    Subjective Assessment - 10/05/19 1218    Subjective Patient reports he was previously in therapy. He started having increased lower back pain and was told to take a break from therapy. He continues to have lower back pain especially when changing positions and bending forward. He did have an MRI and states it is clear. He has been back at work at [employer] duty and it is difficult, using lumbar report and special cushion while sitting. Long car rides give him a lot of trouble. Also notes at night is when he has a lot of pain and keep him from sleeping.    Limitations Sitting;Standing;Walking;House hold activities;Lifting    How long can you sit comfortably? 15-20 minutes    How long can you stand comfortably? 10 minutes    How  long can you walk comfortably? 15-20 minutes    Diagnostic tests MRI - patient reports no new abnormalities    Patient Stated Goals Improve low back/leg pain and stretch, return to work    Currently in Pain? Yes    Pain Score 4    At worst 7-8/10 generally once per day   Pain Location Back    Pain Orientation Lower    Pain Descriptors / Indicators Dull   "intense"   Pain Type Chronic pain;Surgical pain    Pain Radiating Towards right leg and bottom of right foot cramping sensation    Pain Onset More than a month ago    Pain Frequency Constant    Aggravating Factors  Sitting, bending, changing positions, walking, standing    Pain Relieving Factors Using brace, medication    Effect of Pain on Daily Activities Patient limited with mobility and while at work              [facility] PT Assessment - 10/05/19 0001      Assessment   Medical Diagnosis L4-S1 Fusion    Referring Provider (PT) Meda Coffee, MD    Onset Date/Surgical Date 04/22/19    Next MD Visit Not scheduled    Prior Therapy Yes      Precautions  Precautions None    Precaution Comments Patient instructed by surgeon not to lift heavy      Restrictions   Weight Bearing Restrictions No      Balance Screen   Has the patient fallen in the past 6 months No    Has the patient had a decrease in activity level because of a fear of falling?  No    Is the patient reluctant to leave their home because of a fear of falling?  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    Leisure None reported      Cognition   Overall Cognitive Status Within Functional Limits for tasks assessed      Observation/Other Assessments   Observations Patient appears in no apparent distress    Focus on Therapeutic Outcomes (FOTO)  Not assessed this visit, will assess on next appointment      Sensation   Light Touch Appears Intact       Coordination   Gross Motor Movements are Fluid and Coordinated Yes      Functional Tests   Functional tests Other      Other:   Other/ Comments Lifting: patient exhibits increased lumbar flexion and rotation, poor lumbopelvic control      Posture/Postural Control   Posture Comments Rounded shoulder posture      ROM / Strength   AROM / PROM / Strength AROM;PROM;Strength      AROM   Overall AROM Comments Lumbar AROM grossly 50% in all planes, increased pain with flexion and extension      PROM   Overall PROM Comments Hip PROM grossly WFL and non painful      Strength   Strength Assessment Site Hip;Knee    Right/Left Hip Right;Left    Right Hip Flexion 4/5    Right Hip Extension 3-/5    Right Hip ABduction 3+/5    Left Hip Flexion 4+/5    Left Hip Extension 4-/5    Left Hip ABduction 4-/5    Right/Left Knee Right;Left    Right Knee Flexion 4+/5    Right Knee Extension 4+/5    Left Knee Flexion 5/5    Left Knee Extension 5/5      Flexibility   Soft Tissue Assessment /Muscle Length yes    Hamstrings Limited bilaterally, increased foot tingling/camping on right    Quadriceps WFL    Piriformis Limited on right      Palpation   Spinal mobility Not assessed      Special Tests   Other special tests Not assessed      Transfers   Transfers Independent with all Transfers                      Objective measurements completed on examination: See above findings.       [facility] PT Treatment/Exercise - 10/05/19 0001      Exercises   Exercises Lumbar      Lumbar Exercises: Supine   Straight Leg Raise 5 reps   2 sets   Straight Leg Raises Limitations partial range with focus on core activation - greater difficulty on right    Other Supine Lumbar Exercises Alterating leg lifts x 5  - focus on maintaining neutral spine      Lumbar Exercises: Sidelying   Hip Abduction 10 reps  Lumbar Exercises: Prone   Straight Leg Raise 5 reps   2 sets   Straight  Leg Raises Limitations 2 pillows under hips, focus on gluteal enagement keeping neutral lumbar spine - greater difficulty on right      Lumbar Exercises: Quadruped   Opposite Arm/Leg Raise 5 reps;3 seconds   2 sets   Opposite Arm/Leg Raise Limitations focus on core activation, belly button to spine                  PT Education - 10/05/19 1224    Education Details Exam findings, HEP, POC, sitting posture/modifications    Person(s) Educated Patient    Methods Explanation;Demonstration;Tactile cues;Verbal cues;Handout    Comprehension Verbalized understanding;Returned demonstration;Verbal cues required;Tactile cues required;Need further instruction            PT Short Term Goals - 10/05/19 1325      PT SHORT TERM GOAL #1   Title Patient will be I with initial HEP to progress strength and mobility    Time 4    Period Weeks    Status New    Target Date 11/02/19      PT SHORT TERM GOAL #2   Title Patient will demonstrate proper lifting mechanics using hip hinge technique and maintaining neutral lumbar spine to reduce pain getting light objects out of fridge    Time 4    Period Weeks    Status New    Target Date 11/02/19             PT Long Term Goals - 10/05/19 1327      PT LONG TERM GOAL #1   Title Patient will be I with final HEP to maintain progress from PT    Time 8    Period Weeks    Status New    Target Date 11/30/19      PT LONG TERM GOAL #2   Title Patient will demonstrate improved gross hip and core strength to >/= 4+/5 MMT in order to improve lifting and walking tolerance    Time 8    Period Weeks    Status New    Target Date 11/30/19      PT LONG TERM GOAL #3   Title Patient will be able to tolerate >/= 30 minutes of walking in order to improve community access    Time 8    Period Weeks    Status New    Target Date 11/30/19      PT LONG TERM GOAL #4   Title Patient will report improve seated tolerance to >/= 1 hour without needing to  stand/stretch in order to improve driving ability    Time 8    Period Weeks    Status New    Target Date 11/30/19      PT LONG TERM GOAL #5   Title Patient will be able to proper lift >/= 30 lbs in order to improve performance of household tasks and return to work    Time 8    Period Weeks    Status New    Target Date 11/30/19                  Plan - 10/05/19 1226    Clinical Impression Statement Patient presents to PT with report of bilateral lower back pain and radiating pain/numbness into right leg and feet. He had previous stint of PT that was going well but began having increased symptoms so his surgeon had  him stop therapy until he could be further assessd. Patient did have repeat MRI which was negative, but continues to have lower back and right leg pain that limits his ability to perform activity and work duties. Currently he exhibits limitation in all planes of lumbar motion, impaired core and hip strength with poor lumbopelvic control, poor seated and standing/walking tolerance with increased pain. He was provided with new HEP to focus on core and hip strengthening and he was instucted on proper seated and lifting posture. Patient would benefit from continued skilled PT to progress his core strength in order to reduce pain and perform walking and lifting tasks without limitation.    Personal Factors and Comorbidities Time since onset of injury/illness/exacerbation;Comorbidity 1;Fitness;Past/Current Experience    Comorbidities Anxiety/depression    Examination-Activity Limitations Locomotion Level;Sit;Stand;Lift;Bend    Examination-Participation Restrictions Occupation;Community Activity;Driving;Laundry;Yard Work    [employer] Evolving/Moderate complexity    Clinical Decision Making Moderate    Rehab Potential Good    PT Frequency 1x / week    PT Duration 8 weeks    PT Treatment/Interventions ADLs/Self Care Home Management;Aquatic  Therapy;Cryotherapy;Electrical Stimulation;Moist Heat;Neuromuscular re-education;Balance training;Therapeutic exercise;Therapeutic activities;Functional mobility training;Stair training;Gait training;Patient/family education;Manual techniques;Dry needling;Passive range of motion;Taping;Spinal Manipulations;Joint Manipulations    PT Next Visit Plan Review HEP and progress PRN, progress core and hip strength, lumbopelvic control, lifting mechanics    PT Home Exercise Plan [id number]: SLR with core activation, sidelying hip abduction, prone hip extension with 2 pillows under hips, bird dog, supine alternating leg lifts    Consulted and Agree with Plan of Care Patient           Patient will benefit from skilled therapeutic intervention in order to improve the following deficits and impairments:  Decreased range of motion, Difficulty walking, Decreased activity tolerance, Pain, Improper body mechanics, Impaired flexibility, Decreased strength, Postural dysfunction  Visit Diagnosis: Chronic bilateral low back pain with right-sided sciatica  Muscle weakness (generalized)     Problem List Patient Active Problem List   Diagnosis Date Noted  . Radiculopathy, lumbosacral region 08/23/2019  . Gastroesophageal reflux disease without esophagitis 06/19/2017  . Need for hepatitis A vaccination 06/19/2017  . 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 Routine screening for STI (sexually transmitted infection) 06/07/2016  . SVT (supraventricular tachycardia) ([place]) 10/08/2014  . Depression 10/08/2014  . Atypical nevi 06/22/2014  . Elevated triglycerides with high cholesterol 09/06/2013  . Anxiety state 08/03/2013  . Essential hypertension, benign 08/03/2013  . Environmental allergies 03/23/2013  . Visit for preventive health examination 03/23/2013  . Meniere disease 03/23/2013  . Intrinsic asthma     Hilda Blades, PT, DPT, LAT, ATC 10/05/19  1:44 PM Phone: [phone] Fax: [facility] [address] [place], [place], [address] Phone: [phone]   Fax:  [phone]  Name: MUTASIM TUCKEY MRN: [id number] Date of Birth: Feb 01, 1985

## 2019-10-06 MED FILL — METHYLPREDNISOLONE 4 MG TAB: 4 | 6 days supply | Qty: 21 | Fill #0

## 2019-10-08 MED FILL — HYDROCODON-APAP 5-325: 5-325 | 7 days supply | Qty: 21 | Fill #0

## 2019-10-14 ENCOUNTER — Encounter: Payer: Self-pay | Admitting: Physician Assistant

## 2019-10-14 ENCOUNTER — Encounter: Payer: Self-pay | Admitting: Physical Therapy

## 2019-10-14 ENCOUNTER — Ambulatory Visit: Payer: PRIVATE HEALTH INSURANCE | Admitting: Physical Therapy

## 2019-10-14 ENCOUNTER — Other Ambulatory Visit: Payer: Self-pay

## 2019-10-14 ENCOUNTER — Other Ambulatory Visit: Payer: Self-pay | Admitting: Physician Assistant

## 2019-10-14 DIAGNOSIS — M5441 Lumbago with sciatica, right side: Secondary | ICD-10-CM | POA: Diagnosis not present

## 2019-10-14 DIAGNOSIS — R2689 Other abnormalities of gait and mobility: Secondary | ICD-10-CM

## 2019-10-14 DIAGNOSIS — G8929 Other chronic pain: Secondary | ICD-10-CM

## 2019-10-14 DIAGNOSIS — M6281 Muscle weakness (generalized): Secondary | ICD-10-CM

## 2019-10-14 MED ORDER — LORAZEPAM 2 MG PO TABS: ORAL_TABLET | ORAL | 1 refills | Status: DC

## 2019-10-14 NOTE — Therapy (Signed)
[place], [place], [address] Phone: [phone]   Fax:  [phone]  Physical Therapy Treatment  Patient Details  Name: Steve Jacobs MRN: [id number] Date of Birth: Oct 21, 1985 Referring Provider (PT): Meda Coffee, MD   Encounter Date: 10/14/2019   PT End of Session - 10/14/19 1020    Visit Number 2    Number of Visits 9    Date for PT Re-Evaluation 12/07/19    Authorization Type Worker's comp: 1 evaluation and 8 treatments approved    Authorization - Visit Number 1    Authorization - Number of Visits 8    PT Start Time 5790    PT Stop Time 1058    PT Time Calculation (min) 40 min    Activity Tolerance Patient tolerated treatment well           Past Medical History:  Diagnosis Date  . Asthma   . Chicken pox   . Depression    Resolved  . Environmental and seasonal allergies   . Essential hypertension, benign 08/03/2013  . Hyperlipidemia   . Meniere's disease   . Thoracic outlet syndrome     Past Surgical History:  Procedure Laterality Date  . Nevus Biopsy      There were no vitals filed for this visit.   Subjective Assessment - 10/14/19 1021    Subjective "The prednisone has been helping, some soreness after the last session, and I have difficulty with with the bird dog exercises so I've only done it once. I am getting a nerve conduction in the next week."    Patient Stated Goals Improve low back/leg pain and stretch, return to work    Currently in Pain? Yes    Pain Score 3     Pain Location Back    Pain Orientation Right    Pain Descriptors / Indicators Aching    Pain Type Chronic pain    Aggravating Factors  sitting, bending,    Pain Relieving Factors medication              [facility] PT Assessment - 10/14/19 0001      Assessment   Medical Diagnosis L4-S1 Fusion    Referring Provider (PT) Meda Coffee, MD                         [facility] Adult PT  Treatment/Exercise - 10/14/19 0001      Lumbar Exercises: Stretches   Lower Trunk Rotation Limitations 1 x 20      Lumbar Exercises: Aerobic   Nustep L5 x 5 min      Lumbar Exercises: Prone   Single Arm Raise Right;Left;10 reps    Straight Leg Raise 10 reps    Opposite Arm/Leg Raise Right arm/Left leg;Left arm/Right leg;10 reps      Manual Therapy   Manual Therapy Soft tissue mobilization;Joint mobilization;Myofascial release;Neural Stretch    Manual therapy comments skilled palpation and monitoring during TPDN    Soft tissue mobilization IASTM along bil lumbar parapsinals   tack and stretch of R piriformis   Myofascial Release fascial stretching and rolling    Neural Stretch hamstring strech with ankle pumps RLE only 1 x 20   cues for slow controlled motion.           Trigger Point Dry Needling - 10/14/19 0001    Consent Given? Yes    Muscles Treated Back/Hip Lumbar multifidi    Lumbar multifidi  Response Twitch response elicited;Palpable increased muscle length   bil at L4               PT Education - 10/14/19 1047    Education Details muscle anatomy and referral patterns, what TPDN is, beneftis and what to expect.    Person(s) Educated Patient    Methods Explanation;Verbal cues    Comprehension Verbalized understanding;Verbal cues required            PT Short Term Goals - 10/05/19 1325      PT SHORT TERM GOAL #1   Title Patient will be I with initial HEP to progress strength and mobility    Time 4    Period Weeks    Status New    Target Date 11/02/19      PT SHORT TERM GOAL #2   Title Patient will demonstrate proper lifting mechanics using hip hinge technique and maintaining neutral lumbar spine to reduce pain getting light objects out of fridge    Time 4    Period Weeks    Status New    Target Date 11/02/19             PT Long Term Goals - 10/05/19 1327      PT LONG TERM GOAL #1   Title Patient will be I with final HEP to maintain progress  from PT    Time 8    Period Weeks    Status New    Target Date 11/30/19      PT LONG TERM GOAL #2   Title Patient will demonstrate improved gross hip and core strength to >/= 4+/5 MMT in order to improve lifting and walking tolerance    Time 8    Period Weeks    Status New    Target Date 11/30/19      PT LONG TERM GOAL #3   Title Patient will be able to tolerate >/= 30 minutes of walking in order to improve community access    Time 8    Period Weeks    Status New    Target Date 11/30/19      PT LONG TERM GOAL #4   Title Patient will report improve seated tolerance to >/= 1 hour without needing to stand/stretch in order to improve driving ability    Time 8    Period Weeks    Status New    Target Date 11/30/19      PT LONG TERM GOAL #5   Title Patient will be able to proper lift >/= 30 lbs in order to improve performance of household tasks and return to work    Time 8    Period Weeks    Status New    Target Date 11/30/19                 Plan - 10/14/19 1048    Clinical Impression Statement pt reports limited consistency with HEP due to difficulty especially bird dogs. modified bird dog to be performed in prone position which he tolaterated well. educated and consent was provided for TPDN focusing on bil lumbar multofid followed with IASTM and myofascial release techniques. trialed nerve stretch/ glide with ankle pumps in hamstring stretch which he noted relief of tension in back and reduction in N/T severity in the RLE    PT Treatment/Interventions ADLs/Self Care Home Management;Aquatic Therapy;Cryotherapy;Electrical Stimulation;Moist Heat;Neuromuscular re-education;Balance training;Therapeutic exercise;Therapeutic activities;Functional mobility training;Stair training;Gait training;Patient/family education;Manual techniques;Dry needling;Passive range of motion;Taping;Spinal Manipulations;Joint Manipulations  PT Next Visit Plan Review HEP and progress PRN, progress core  and hip strength, lumbopelvic control, lifting mechanics, responsed to DN, how was nerve glide/ flossing.    PT Home Exercise Plan [id number]: SLR with core activation, sidelying hip abduction, prone hip extension with 2 pillows under hips, bird dog (modified to prone vs quadruped), supine alternating leg lifts    Consulted and Agree with Plan of Care Patient           Patient will benefit from skilled therapeutic intervention in order to improve the following deficits and impairments:  Decreased range of motion, Difficulty walking, Decreased activity tolerance, Pain, Improper body mechanics, Impaired flexibility, Decreased strength, Postural dysfunction  Visit Diagnosis: Chronic bilateral low back pain with right-sided sciatica  Muscle weakness (generalized)  Other abnormalities of gait and mobility     Problem List Patient Active Problem List   Diagnosis Date Noted  . Radiculopathy, lumbosacral region 08/23/2019  . Gastroesophageal reflux disease without esophagitis 06/19/2017  . Need for hepatitis A vaccination 06/19/2017  . 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 Routine screening for STI (sexually transmitted infection) 06/07/2016  . SVT (supraventricular tachycardia) ([place]) 10/08/2014  . Depression 10/08/2014  . Atypical nevi 06/22/2014  . Elevated triglycerides with high cholesterol 09/06/2013  . Anxiety state 08/03/2013  . Essential hypertension, benign 08/03/2013  . Environmental allergies 03/23/2013  . Visit for preventive health examination 03/23/2013  . Meniere disease 03/23/2013  . Intrinsic asthma    Starr Lake PT, DPT, LAT, ATC  10/14/19  10:59 AM      [facility] [address] [place], [place], [address] Phone: [phone]   Fax:  [phone]  Name: Steve Jacobs MRN: [id number] Date of Birth: 1985-06-11

## 2019-10-15 ENCOUNTER — Other Ambulatory Visit (HOSPITAL_COMMUNITY): Payer: Self-pay | Admitting: Orthopedic Surgery

## 2019-10-15 ENCOUNTER — Encounter
Payer: PRIVATE HEALTH INSURANCE | Attending: Physical Medicine & Rehabilitation | Admitting: Physical Medicine & Rehabilitation

## 2019-10-15 ENCOUNTER — Encounter: Payer: Self-pay | Admitting: Physical Medicine & Rehabilitation

## 2019-10-15 VITALS — BP 130/92 | HR 82 | Temp 98.0°F | Ht 70.0 in | Wt 216.0 lb

## 2019-10-15 DIAGNOSIS — G8929 Other chronic pain: Secondary | ICD-10-CM | POA: Diagnosis not present

## 2019-10-15 DIAGNOSIS — M5441 Lumbago with sciatica, right side: Secondary | ICD-10-CM | POA: Diagnosis present

## 2019-10-15 MED FILL — LORazepam 2 MG TABS: 2 | 30 days supply | Qty: 60 | Fill #0

## 2019-10-15 MED FILL — METHOCARBAMOL 750 MG TABS: 750 | 20 days supply | Qty: 40 | Fill #0

## 2019-10-15 MED FILL — PREGABALIN 75 MG CAPS: 75 | 20 days supply | Qty: 40 | Fill #0

## 2019-10-15 NOTE — Progress Notes (Signed)
34 year old male with history of lumbar pain and radiculopathy who underwent L4-5 L5-S1 fusion earlier this year and is referred for EMG/NCV to evaluate for lower extremity discomfort.  Please see EMG report scanned under media section.  Right lower extremity NCV testing right sural, superficial fibular sensory, deep peroneal motor, tibial motor, tibial motor F wave, all normal. EMG needle study checking gluteus maximus, right rectus femoris, right vastus lateralis, right superficial fibular, right tibialis anterior, right gastrocnemius.  Normal

## 2019-10-19 ENCOUNTER — Other Ambulatory Visit: Payer: Self-pay

## 2019-10-19 ENCOUNTER — Encounter: Payer: Self-pay | Admitting: Physical Therapy

## 2019-10-19 ENCOUNTER — Ambulatory Visit: Payer: PRIVATE HEALTH INSURANCE | Attending: Orthopedic Surgery | Admitting: Physical Therapy

## 2019-10-19 DIAGNOSIS — R2689 Other abnormalities of gait and mobility: Secondary | ICD-10-CM

## 2019-10-19 DIAGNOSIS — M5416 Radiculopathy, lumbar region: Secondary | ICD-10-CM

## 2019-10-19 DIAGNOSIS — M5441 Lumbago with sciatica, right side: Secondary | ICD-10-CM | POA: Diagnosis not present

## 2019-10-19 DIAGNOSIS — M6281 Muscle weakness (generalized): Secondary | ICD-10-CM | POA: Diagnosis present

## 2019-10-19 DIAGNOSIS — G8929 Other chronic pain: Secondary | ICD-10-CM | POA: Diagnosis present

## 2019-10-19 DIAGNOSIS — M545 Low back pain, unspecified: Secondary | ICD-10-CM | POA: Diagnosis present

## 2019-10-19 NOTE — Therapy (Signed)
[place], [place], [address] Phone: [phone]   Fax:  [phone]  Physical Therapy Treatment  Patient Details  Name: Steve Jacobs MRN: [id number] Date of Birth: [birth year] Referring Provider (PT): Meda Coffee, MD   Encounter Date: 10/19/2019   PT End of Session - 10/19/19 1424    Visit Number 3    Number of Visits 9    Date for PT Re-Evaluation 12/07/19    Authorization Type Worker's comp: 1 evaluation and 8 treatments approved    PT Start Time 5701    PT Stop Time 1500    PT Time Calculation (min) 43 min    Activity Tolerance Patient tolerated treatment well    Behavior During Therapy [facility] for tasks assessed/performed           Past Medical History:  Diagnosis Date  . Asthma   . Chicken pox   . Depression    Resolved  . Environmental and seasonal allergies   . Essential hypertension, benign 08/03/2013  . Hyperlipidemia   . Meniere's disease   . Thoracic outlet syndrome     Past Surgical History:  Procedure Laterality Date  . LUMBAR FUSION  04/22/2019   [facility] in [place]- 2 LEVEL FUSION  . Nevus Biopsy      There were no vitals filed for this visit.   Subjective Assessment - 10/19/19 1422    Subjective Patient reported needling causesd significant improvement from last visit. he had very little pain follwing the  needling.He has been able to sit at his desk with very little pain. He has been taking less medication    Limitations Sitting;Standing;Walking;House hold activities;Lifting    How long can you sit comfortably? 15-20 minutes    How long can you stand comfortably? 10 minutes    How long can you walk comfortably? 15-20 minutes    Diagnostic tests MRI - patient reports no new abnormalities    Patient Stated Goals Improve low back/leg pain and stretch, return to work    Currently in Pain? No/denies                             [facility] Adult PT  Treatment/Exercise - 10/20/19 0001      Lumbar Exercises: Stretches   Lower Trunk Rotation Limitations 1 x 20    Piriformis Stretch 3 reps;30 seconds;Right;Left      Lumbar Exercises: Seated   LAQ on Chair Limitations x20 green     Other Seated Lumbar Exercises hamstirng curl x20 green     Other Seated Lumbar Exercises hip abdcuction 2x10       Lumbar Exercises: Supine   Clam 20 reps    Clam Limitations x30 green band    Bent Knee Raise Limitations x20       Manual Therapy   Manual Therapy Soft tissue mobilization;Joint mobilization;Myofascial release;Neural Stretch    Manual therapy comments skilled palpation and monitoring during TPDN    Soft tissue mobilization IASTM along bil lumbar parapsinals   tack and stretch of R piriformis   Myofascial Release fascial stretching and rolling    Neural Stretch --   cues for slow controlled motion.                 PT Education - 10/19/19 1424    Education Details reviewed benefits and risks of TPDN    Person(s) Educated Patient    Methods  Explanation;Demonstration;Tactile cues;Verbal cues    Comprehension Verbalized understanding;Returned demonstration;Verbal cues required;Tactile cues required            PT Short Term Goals - 10/19/19 1453      PT SHORT TERM GOAL #1   Title Patient will be I with initial HEP to progress strength and mobility    Baseline perfroming log roll without pain    Time 4    Period Weeks    Status On-going    Target Date 11/02/19      PT SHORT TERM GOAL #2   Title Patient will demonstrate proper lifting mechanics using hip hinge technique and maintaining neutral lumbar spine to reduce pain getting light objects out of fridge    Baseline able to tranfer 10x without hands    Time 4    Period Weeks    Status On-going    Target Date 11/02/19      PT SHORT TERM GOAL #3   Title Patient will ambualte 300' with [facility] with good safety and stability    Baseline No device    Time 3    Period Weeks     Status Achieved    Target Date 05/27/19             PT Long Term Goals - 10/05/19 1327      PT LONG TERM GOAL #1   Title Patient will be I with final HEP to maintain progress from PT    Time 8    Period Weeks    Status New    Target Date 11/30/19      PT LONG TERM GOAL #2   Title Patient will demonstrate improved gross hip and core strength to >/= 4+/5 MMT in order to improve lifting and walking tolerance    Time 8    Period Weeks    Status New    Target Date 11/30/19      PT LONG TERM GOAL #3   Title Patient will be able to tolerate >/= 30 minutes of walking in order to improve community access    Time 8    Period Weeks    Status New    Target Date 11/30/19      PT LONG TERM GOAL #4   Title Patient will report improve seated tolerance to >/= 1 hour without needing to stand/stretch in order to improve driving ability    Time 8    Period Weeks    Status New    Target Date 11/30/19      PT LONG TERM GOAL #5   Title Patient will be able to proper lift >/= 30 lbs in order to improve performance of household tasks and return to work    Time 8    Period Weeks    Status New    Target Date 11/30/19                 Plan - 10/19/19 1425    Clinical Impression Statement Therapy needled the lumbar multifidi, right glute medius and left thoriac paraspinals. He reported no significant increase in pain. He perfromed light exercises with no signiifcant increase in pain. Therapy will continue with manual therapy and  ther-ex as toelrated.    Personal Factors and Comorbidities Time since onset of injury/illness/exacerbation;Comorbidity 1;Fitness;Past/Current Experience;Finances    Comorbidities Anxiety/depression    Examination-Activity Limitations Locomotion Level;Sit;Stand;Lift;Bend    Examination-Participation Restrictions Occupation;Community Activity;Driving;Laundry;Yard Work    [employer] Evolving/Moderate complexity    Clinical  Decision  Making Moderate    Rehab Potential Good    PT Frequency 1x / week    PT Duration 8 weeks    PT Treatment/Interventions ADLs/Self Care Home Management;Aquatic Therapy;Cryotherapy;Electrical Stimulation;Moist Heat;Neuromuscular re-education;Balance training;Therapeutic exercise;Therapeutic activities;Functional mobility training;Stair training;Gait training;Patient/family education;Manual techniques;Dry needling;Passive range of motion;Taping;Spinal Manipulations;Joint Manipulations    PT Next Visit Plan Review HEP and progress PRN, progress core and hip strength, lumbopelvic control, lifting mechanics, responsed to DN, how was nerve glide/ flossing.    PT Home Exercise Plan [id number]: SLR with core activation, sidelying hip abduction, prone hip extension with 2 pillows under hips, bird dog (modified to prone vs quadruped), supine alternating leg lifts    Consulted and Agree with Plan of Care Patient           Patient will benefit from skilled therapeutic intervention in order to improve the following deficits and impairments:  Decreased range of motion, Difficulty walking, Decreased activity tolerance, Pain, Improper body mechanics, Impaired flexibility, Decreased strength, Postural dysfunction  Visit Diagnosis: Chronic bilateral low back pain with right-sided sciatica  Muscle weakness (generalized)  Other abnormalities of gait and mobility  Chronic bilateral low back pain without sciatica  Radiculopathy, lumbar region     Problem List Patient Active Problem List   Diagnosis Date Noted  . Radiculopathy, lumbosacral region 08/23/2019  . Gastroesophageal reflux disease without esophagitis 06/19/2017  . Need for hepatitis A vaccination 06/19/2017  . 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 Routine screening for STI (sexually transmitted infection) 06/07/2016  . SVT (supraventricular tachycardia) ([place]) 10/08/2014  . Depression 10/08/2014  . Atypical nevi 06/22/2014  . Elevated triglycerides  with high cholesterol 09/06/2013  . Anxiety state 08/03/2013  . Essential hypertension, benign 08/03/2013  . Environmental allergies 03/23/2013  . Visit for preventive health examination 03/23/2013  . Meniere disease 03/23/2013  . Intrinsic asthma     Carney Living PT DPT  10/20/2019, 12:41 PM  [facility] [address] [place], [place], [address] Phone: [phone]   Fax:  [phone]  Name: Steve Jacobs MRN: [id number] Date of Birth: 05-31-1985

## 2019-10-20 ENCOUNTER — Encounter: Payer: Self-pay | Admitting: Physical Therapy

## 2019-10-22 ENCOUNTER — Encounter: Payer: Self-pay | Admitting: Physician Assistant

## 2019-10-22 ENCOUNTER — Other Ambulatory Visit: Payer: Self-pay | Admitting: Physician Assistant

## 2019-10-22 MED FILL — FENOFIBRATE 145 MG TABS: 145 | 90 days supply | Qty: 90 | Fill #0

## 2019-10-22 MED FILL — LEVOCETIRIZINE 5 MG TABLET: 5 | 30 days supply | Qty: 30 | Fill #1

## 2019-10-24 NOTE — Telephone Encounter (Signed)
Recommend video visit to discuss.

## 2019-10-25 ENCOUNTER — Other Ambulatory Visit (HOSPITAL_COMMUNITY): Payer: Self-pay | Admitting: Orthopedic Surgery

## 2019-10-25 MED FILL — diazePAM 5 MG TABS: 5 | 5 days supply | Qty: 15 | Fill #0

## 2019-10-25 MED FILL — METHOCARBAMOL 750 MG TABS: 750 | 30 days supply | Qty: 90 | Fill #0

## 2019-10-26 ENCOUNTER — Ambulatory Visit: Payer: PRIVATE HEALTH INSURANCE | Attending: Orthopedic Surgery | Admitting: Physical Therapy

## 2019-10-26 ENCOUNTER — Other Ambulatory Visit: Payer: Self-pay

## 2019-10-26 ENCOUNTER — Encounter: Payer: Self-pay | Admitting: Physical Therapy

## 2019-10-26 DIAGNOSIS — M5416 Radiculopathy, lumbar region: Secondary | ICD-10-CM | POA: Diagnosis present

## 2019-10-26 DIAGNOSIS — M545 Low back pain, unspecified: Secondary | ICD-10-CM | POA: Insufficient documentation

## 2019-10-26 DIAGNOSIS — M5441 Lumbago with sciatica, right side: Secondary | ICD-10-CM | POA: Insufficient documentation

## 2019-10-26 DIAGNOSIS — M6281 Muscle weakness (generalized): Secondary | ICD-10-CM | POA: Diagnosis present

## 2019-10-26 DIAGNOSIS — G8929 Other chronic pain: Secondary | ICD-10-CM | POA: Diagnosis present

## 2019-10-26 DIAGNOSIS — R2689 Other abnormalities of gait and mobility: Secondary | ICD-10-CM | POA: Insufficient documentation

## 2019-10-27 ENCOUNTER — Encounter: Payer: Self-pay | Admitting: Physical Therapy

## 2019-10-27 NOTE — Therapy (Signed)
[place], [place], [address] Phone: [phone]   Fax:  [phone]  Physical Therapy Treatment  Patient Details  Name: Steve Jacobs MRN: [id number] Date of Birth: Jun 17, 1985 Referring Provider (PT): Meda Coffee, MD   Encounter Date: 10/26/2019   PT End of Session - 10/26/19 1440    Visit Number 4    Number of Visits 9    Date for PT Re-Evaluation 12/07/19    Authorization Type Worker's comp: 1 evaluation and 8 treatments approved    Authorization - Visit Number 1    Authorization - Number of Visits 8    PT Start Time 6010    PT Stop Time 1456    PT Time Calculation (min) 41 min    Activity Tolerance Patient tolerated treatment well    Behavior During Therapy [facility] for tasks assessed/performed           Past Medical History:  Diagnosis Date  . Asthma   . Chicken pox   . Depression    Resolved  . Environmental and seasonal allergies   . Essential hypertension, benign 08/03/2013  . Hyperlipidemia   . Meniere's disease   . Thoracic outlet syndrome     Past Surgical History:  Procedure Laterality Date  . LUMBAR FUSION  04/22/2019   [facility] in [place]- 2 LEVEL FUSION  . Nevus Biopsy      There were no vitals filed for this visit.   Subjective Assessment - 10/26/19 1422    Subjective Patient reports his back has been doing OK. He has had no significant increase in pain. He went back to the MD who is happy with his progress. He feels like his pain is slightly elevated today.    Limitations Sitting;Standing;Walking;House hold activities;Lifting    How long can you sit comfortably? 15-20 minutes    How long can you stand comfortably? 10 minutes    How long can you walk comfortably? 15-20 minutes    Diagnostic tests MRI - patient reports no new abnormalities    Patient Stated Goals Improve low back/leg pain and stretch, return to work    Currently in Pain? Yes    Pain Score 4     Pain  Location Back    Pain Orientation Right    Pain Descriptors / Indicators Aching    Pain Type Chronic pain    Pain Radiating Towards into the right leg at times    Pain Onset More than a month ago    Pain Frequency Constant    Aggravating Factors  sitting and bending    Pain Relieving Factors medication    Effect of Pain on Daily Activities patient limited with mobility                             [facility] Adult PT Treatment/Exercise - 10/27/19 0001      Lumbar Exercises: Stretches   Lower Trunk Rotation Limitations 1 x 20    Piriformis Stretch 3 reps;30 seconds;Right;Left      Lumbar Exercises: Aerobic   Nustep L5 x 5 min      Lumbar Exercises: Seated   Long Arc Quad on Chair 20 reps    LAQ on Chair Limitations 3lb     Other Seated Lumbar Exercises hip abdcuction 2x10 green       Shoulder Exercises: Seated   Other Seated Exercises seated bilateral er green x20; horizontal  abdcution 2x10       Manual Therapy   Manual Therapy Soft tissue mobilization;Joint mobilization;Myofascial release;Neural Stretch    Manual therapy comments skilled palpation and monitoring during TPDN    Soft tissue mobilization IASTM along bil lumbar parapsinals   tack and stretch of R piriformis   Myofascial Release fascial stretching and rolling    Neural Stretch --   cues for slow controlled motion.           Trigger Point Dry Needling - 10/27/19 0001    Consent Given? Yes    Muscles Treated Back/Hip Lumbar multifidi;Gluteus medius;Thoracic multifidi    Dry Needling Comments L3-L4 and lupperright gluteal 2 needles in the gluteal 1 needle each side of L4 and L2multifidi .30x50     Gluteus Medius Response Twitch response elicited    Lumbar multifidi Response Twitch response elicited;Palpable increased muscle length                PT Education - 10/26/19 1426    Education Details reviewed POC going forward    Person(s) Educated Patient    Methods  Explanation;Demonstration;Tactile cues;Verbal cues    Comprehension Verbalized understanding;Returned demonstration;Tactile cues required            PT Short Term Goals - 10/19/19 1453      PT SHORT TERM GOAL #1   Title Patient will be I with initial HEP to progress strength and mobility    Baseline perfroming log roll without pain    Time 4    Period Weeks    Status On-going    Target Date 11/02/19      PT SHORT TERM GOAL #2   Title Patient will demonstrate proper lifting mechanics using hip hinge technique and maintaining neutral lumbar spine to reduce pain getting light objects out of fridge    Baseline able to tranfer 10x without hands    Time 4    Period Weeks    Status On-going    Target Date 11/02/19      PT SHORT TERM GOAL #3   Title Patient will ambualte 300' with [facility] with good safety and stability    Baseline No device    Time 3    Period Weeks    Status Achieved    Target Date 05/27/19             PT Long Term Goals - 10/05/19 1327      PT LONG TERM GOAL #1   Title Patient will be I with final HEP to maintain progress from PT    Time 8    Period Weeks    Status New    Target Date 11/30/19      PT LONG TERM GOAL #2   Title Patient will demonstrate improved gross hip and core strength to >/= 4+/5 MMT in order to improve lifting and walking tolerance    Time 8    Period Weeks    Status New    Target Date 11/30/19      PT LONG TERM GOAL #3   Title Patient will be able to tolerate >/= 30 minutes of walking in order to improve community access    Time 8    Period Weeks    Status New    Target Date 11/30/19      PT LONG TERM GOAL #4   Title Patient will report improve seated tolerance to >/= 1 hour without needing to stand/stretch in order to improve driving ability  Time 8    Period Weeks    Status New    Target Date 11/30/19      PT LONG TERM GOAL #5   Title Patient will be able to proper lift >/= 30 lbs in order to improve performance of  household tasks and return to work    Time 8    Period Weeks    Status New    Target Date 11/30/19                 Plan - 10/26/19 1442    Clinical Impression Statement Therapy perfromed needling on the lumbar mutfidi and glut medius on the right. We also focused on trigger point release to the lumbar spine and the gluteals. He has noticed that bending over for fucntional activity has improved at home. he is having less catching. He perfroemd general strengthening for legs and postrual strengthening for upper body without much difficulty.    Personal Factors and Comorbidities Time since onset of injury/illness/exacerbation;Comorbidity 1;Fitness;Past/Current Experience;Finances    Comorbidities Anxiety/depression    Examination-Activity Limitations Locomotion Level;Sit;Stand;Lift;Bend    Examination-Participation Restrictions Occupation;Community Activity;Driving;Laundry;Yard Work    [employer] Evolving/Moderate complexity    Clinical Decision Making Moderate    Rehab Potential Good    PT Frequency 1x / week    PT Duration 8 weeks    PT Treatment/Interventions ADLs/Self Care Home Management;Aquatic Therapy;Cryotherapy;Electrical Stimulation;Moist Heat;Neuromuscular re-education;Balance training;Therapeutic exercise;Therapeutic activities;Functional mobility training;Stair training;Gait training;Patient/family education;Manual techniques;Dry needling;Passive range of motion;Taping;Spinal Manipulations;Joint Manipulations    PT Next Visit Plan Review HEP and progress PRN, progress core and hip strength, lumbopelvic control, lifting mechanics, responsed to DN, how was nerve glide/ flossing.    PT Home Exercise Plan [id number]: SLR with core activation, sidelying hip abduction, prone hip extension with 2 pillows under hips, bird dog (modified to prone vs quadruped), supine alternating leg lifts    Consulted and Agree with Plan of Care Patient           Patient will  benefit from skilled therapeutic intervention in order to improve the following deficits and impairments:  Decreased range of motion, Difficulty walking, Decreased activity tolerance, Pain, Improper body mechanics, Impaired flexibility, Decreased strength, Postural dysfunction  Visit Diagnosis: Chronic bilateral low back pain with right-sided sciatica  Muscle weakness (generalized)  Other abnormalities of gait and mobility  Chronic bilateral low back pain without sciatica  Radiculopathy, lumbar region     Problem List Patient Active Problem List   Diagnosis Date Noted  . Radiculopathy, lumbosacral region 08/23/2019  . Gastroesophageal reflux disease without esophagitis 06/19/2017  . Need for hepatitis A vaccination 06/19/2017  . 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 Routine screening for STI (sexually transmitted infection) 06/07/2016  . SVT (supraventricular tachycardia) ([place]) 10/08/2014  . Depression 10/08/2014  . Atypical nevi 06/22/2014  . Elevated triglycerides with high cholesterol 09/06/2013  . Anxiety state 08/03/2013  . Essential hypertension, benign 08/03/2013  . Environmental allergies 03/23/2013  . Visit for preventive health examination 03/23/2013  . Meniere disease 03/23/2013  . Intrinsic asthma     Carney Living PT DPT  10/27/2019, 1:41 PM  [facility] [address] [place], [place], [address] Phone: [phone]   Fax:  [phone]  Name: Steve Jacobs MRN: [id number] Date of Birth: 04-Jan-1986

## 2019-10-29 ENCOUNTER — Telehealth (INDEPENDENT_AMBULATORY_CARE_PROVIDER_SITE_OTHER): Payer: No Typology Code available for payment source | Admitting: Physician Assistant

## 2019-10-29 ENCOUNTER — Other Ambulatory Visit: Payer: Self-pay

## 2019-10-29 DIAGNOSIS — F5101 Primary insomnia: Secondary | ICD-10-CM

## 2019-10-29 DIAGNOSIS — F411 Generalized anxiety disorder: Secondary | ICD-10-CM

## 2019-10-29 NOTE — Progress Notes (Signed)
Virtual Visit via Video   I connected with patient on 10/29/19 at  4:00 PM EDT by a video enabled telemedicine application and verified that I am speaking with the correct person using two identifiers.  Location patient: Home Location provider: [facility], Office Persons participating in the virtual visit: Patient, Provider, [place] (Patina Moore)  I discussed the limitations of evaluation and management by telemedicine and the availability of in person appointments. The patient expressed understanding and agreed to proceed.  Subjective:   HPI:   Patient presents via [place] today to discuss medication management regarding his anxiety and insomnia.  Patient is currently on a regimen of Paxil 30 mg daily which she endorses taking as directed.  Notes is tolerated very well Mrs. Simona Huh is followed for his overall generalized anxiety.  Still having some episodes of breakthrough anxiety for which he takes Ativan.  Notes the medication does work well for him but feels like he crashes from the medicine and feels worse after taking.  Was previously on diazepam nightly for episode of vertigo secondary to Mnire's disease but did work for anxiety as well.  Notes he did tolerate this much better and is wondering if that is an option to use as needed for anxiety.  In regards to sleep patient still noting substantial issue.  Has been on multiple medications before including trazodone, Ambien and Belsomra which were all subtherapeutic.  Is wanting to know if there are any other options to help him sleep.  Does not want to take a benzodiazepine for sleep.  ROS:   See pertinent positives and negatives per HPI.  Patient Active Problem List   Diagnosis Date Noted  . Radiculopathy, lumbosacral region 08/23/2019  . Gastroesophageal reflux disease without esophagitis 06/19/2017  . Need for hepatitis A vaccination 06/19/2017  . 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 Routine screening for STI (sexually  transmitted infection) 06/07/2016  . SVT (supraventricular tachycardia) ([place]) 10/08/2014  . Depression 10/08/2014  . Atypical nevi 06/22/2014  . Elevated triglycerides with high cholesterol 09/06/2013  . Anxiety state 08/03/2013  . Essential hypertension, benign 08/03/2013  . Environmental allergies 03/23/2013  . Visit for preventive health examination 03/23/2013  . Meniere disease 03/23/2013  . Intrinsic asthma     Social History   Tobacco Use  . Smoking status: Never Smoker  . Smokeless tobacco: Never Used  Substance Use Topics  . Alcohol use: Yes    Alcohol/week: 0.0 standard drinks    Comment: 2-3 beers nightly    Current Outpatient Medications:  .  albuterol (VENTOLIN HFA) 108 (90 Base) MCG/ACT inhaler, INHALE 2 PUFFS BY MOUTH EVERY 6 HOURS AS NEEDED FOR WHEEZING, Disp: 54 g, Rfl: 1 .  atorvastatin (LIPITOR) 20 MG tablet, TAKE 1 TABLET (20 MG TOTAL) BY MOUTH DAILY., Disp: 90 tablet, Rfl: 0 .  budesonide-formoterol (SYMBICORT) 80-4.5 MCG/ACT inhaler, Inhale 2 puffs into the lungs 2 (two) times daily., Disp: 1 Inhaler, Rfl: 6 .  diphenhydrAMINE (BENADRYL) 25 MG tablet, Take 25 mg by mouth every 6 (six) hours as needed for itching or allergies., Disp: , Rfl:  .  fenofibrate (TRICOR) 145 MG tablet, TAKE 1 TABLET (145 MG TOTAL) BY MOUTH DAILY., Disp: 90 tablet, Rfl: 0 .  finasteride (PROPECIA) 1 MG tablet, TAKE 1 TABLET (1 MG TOTAL) BY MOUTH DAILY., Disp: 90 tablet, Rfl: 0 .  fluticasone (FLONASE) 50 MCG/ACT nasal spray, Place 2 sprays into both nostrils daily., Disp: 16 g, Rfl: 3 .  levocetirizine (XYZAL) 5 MG tablet, TAKE  1 TABLET BY MOUTH EVERY EVENING., Disp: 30 tablet, Rfl: 3 .  LORazepam (ATIVAN) 2 MG tablet, Take 1 tablet by mouth as needed, 1-2 x daily. No more than 1 dose in 8 hours., Disp: 60 tablet, Rfl: 1 .  methocarbamol (ROBAXIN) 750 MG tablet, Take 750 mg by mouth 4 (four) times daily., Disp: , Rfl:  .  metoprolol succinate (TOPROL-XL) 50 MG 24 hr tablet, TAKE 1  TABLET (50 MG TOTAL) BY MOUTH DAILY., Disp: 90 tablet, Rfl: 0 .  montelukast (SINGULAIR) 10 MG tablet, TAKE 1 TABLET (10 MG TOTAL) BY MOUTH AT BEDTIME., Disp: 90 tablet, Rfl: 1 .  Multiple Vitamin (MULTIVITAMIN WITH MINERALS) TABS tablet, Take 1 tablet by mouth daily., Disp: , Rfl:  .  omeprazole (PRILOSEC) 20 MG capsule, TAKE 1 CAPSULE (20 MG TOTAL) BY MOUTH DAILY., Disp: 90 capsule, Rfl: 1 .  PARoxetine (PAXIL) 30 MG tablet, Take 1 tablet (30 mg total) by mouth daily., Disp: 90 tablet, Rfl: 0 .  pregabalin (LYRICA) 150 MG capsule, Take 150 mg by mouth 2 (two) times daily., Disp: , Rfl:  .  pregabalin (LYRICA) 75 MG capsule, Take 75 mg by mouth 2 (two) times daily., Disp: , Rfl:  .  promethazine (PHENERGAN) 25 MG tablet, TAKE 1 TABLET BY MOUTH EVERY 8 HOURS AS NEEDED FOR NAUSEA OR VOMITING, Disp: 20 tablet, Rfl: 0 .  triamterene-hydrochlorothiazide (DYAZIDE) 37.5-25 MG capsule, TAKE 1 CAPSULE BY MOUTH DAILY., Disp: 90 capsule, Rfl: 1 .  VASCEPA 1 g CAPS, TAKE 2 CAPSULES BY MOUTH TWICE A DAY, Disp: 360 capsule, Rfl: 1 .  cyclobenzaprine (FLEXERIL) 10 MG tablet, Take 10 mg by mouth at bedtime. (Patient not taking: Reported on 10/15/2019), Disp: , Rfl:  .  HYDROcodone-acetaminophen (NORCO/VICODIN) 5-325 MG tablet, Take 1 tablet by mouth 3 (three) times daily as needed. (Patient not taking: Reported on 10/15/2019), Disp: , Rfl:  .  methylPREDNISolone (MEDROL DOSEPAK) 4 MG TBPK tablet, Take by mouth. (Patient not taking: Reported on 10/15/2019), Disp: , Rfl:  .  zolpidem (AMBIEN) 5 MG tablet, TAKE 1 TABLET (5 MG TOTAL) BY MOUTH AT BEDTIME AS NEEDED FOR SLEEP (Patient not taking: Reported on 10/15/2019), Disp: 30 tablet, Rfl: 0  Allergies  Allergen Reactions  . Augmentin [Amoxicillin-Pot Clavulanate]   . Belsomra [Suvorexant] Other (See Comments)    nightmares  . Percocet [Oxycodone-Acetaminophen] Nausea And Vomiting    Objective:   There were no vitals taken for this visit.  Patient is  well-developed, well-nourished in no acute distress.  Resting comfortably at home.  Head is normocephalic, atraumatic.  No labored breathing.  Speech is clear and coherent with logical content.  Patient is alert and oriented at baseline.   Assessment and Plan:   1. Anxiety state 2. Primary insomnia Discussed potential for increasing Paxil to 40 mg daily but patient states overall anxiety control.  As such we'll leave it at 30 mg to help prevent any further weight gain that can be associated with Paxil, especially at even higher dose.  We'll stop the Ativan Valium 2 mg to be used up to twice daily as needed.  Will start trial of low-dose Lunesta for sleep.  Sleep hygiene practices reviewed with patient.  Close follow-up discussed.  Patient voiced understanding and agreement with the plan.    Leeanne Rio, PA-C 10/29/2019

## 2019-10-29 NOTE — Progress Notes (Signed)
I have discussed the procedure for the virtual visit with the patient who has given consent to proceed with assessment and treatment.   Terina Mcelhinny L Amanie Mcculley, CMA     

## 2019-10-31 ENCOUNTER — Other Ambulatory Visit: Payer: Self-pay | Admitting: Physician Assistant

## 2019-10-31 MED ORDER — DIAZEPAM 2 MG PO TABS: 2.0000 mg | ORAL_TABLET | Freq: Two times a day (BID) | ORAL | 0 refills | Status: DC | PRN

## 2019-10-31 MED ORDER — ESZOPICLONE 2 MG PO TABS: 2.0000 mg | ORAL_TABLET | Freq: Every evening | ORAL | 1 refills | Status: DC | PRN

## 2019-11-01 ENCOUNTER — Ambulatory Visit: Payer: No Typology Code available for payment source | Admitting: Physical Medicine & Rehabilitation

## 2019-11-01 MED FILL — ESZOPICLONE 2 MG TAB: 2 | 30 days supply | Qty: 30 | Fill #0

## 2019-11-01 MED FILL — diazePAM 2 MG TABS: 2 | 30 days supply | Qty: 60 | Fill #0

## 2019-11-02 ENCOUNTER — Encounter: Payer: Self-pay | Admitting: Physical Therapy

## 2019-11-02 ENCOUNTER — Other Ambulatory Visit: Payer: Self-pay

## 2019-11-02 ENCOUNTER — Ambulatory Visit: Payer: PRIVATE HEALTH INSURANCE | Admitting: Physical Therapy

## 2019-11-02 DIAGNOSIS — M545 Low back pain, unspecified: Secondary | ICD-10-CM

## 2019-11-02 DIAGNOSIS — R2689 Other abnormalities of gait and mobility: Secondary | ICD-10-CM

## 2019-11-02 DIAGNOSIS — M5441 Lumbago with sciatica, right side: Secondary | ICD-10-CM | POA: Diagnosis not present

## 2019-11-02 DIAGNOSIS — M6281 Muscle weakness (generalized): Secondary | ICD-10-CM

## 2019-11-02 DIAGNOSIS — G8929 Other chronic pain: Secondary | ICD-10-CM

## 2019-11-02 DIAGNOSIS — M5416 Radiculopathy, lumbar region: Secondary | ICD-10-CM

## 2019-11-02 NOTE — Therapy (Signed)
[place], [place], [address] Phone: [phone]   Fax:  [phone]  Physical Therapy Treatment  Patient Details  Name: Steve Jacobs MRN: [id number] Date of Birth: 12-07-1985 Referring Provider (PT): Meda Coffee, MD   Encounter Date: 11/02/2019   PT End of Session - 11/02/19 1440    Visit Number 5    Number of Visits 9    Date for PT Re-Evaluation 12/07/19    Authorization Type Worker's comp: 1 evaluation and 8 treatments approved    Authorization - Visit Number 5    Authorization - Number of Visits 8    PT Start Time 0215    PT Stop Time 2992    PT Time Calculation (min) 40 min    Activity Tolerance Patient tolerated treatment well    Behavior During Therapy [facility] for tasks assessed/performed           Past Medical History:  Diagnosis Date   Asthma    Chicken pox    Depression    Resolved   Environmental and seasonal allergies    Essential hypertension, benign 08/03/2013   Hyperlipidemia    Meniere's disease    Thoracic outlet syndrome     Past Surgical History:  Procedure Laterality Date   LUMBAR FUSION  04/22/2019   [facility] [place]- 2 LEVEL FUSION   Nevus Biopsy      There were no vitals filed for this visit.   Subjective Assessment - 11/02/19 1645    Subjective Patient painted over the weekend. He was sore but it wasn;t too bad. He feels he has been able to mange his pain    Limitations Sitting;Standing;Walking;House hold activities;Lifting    How long can you sit comfortably? 15-20 minutes    How long can you stand comfortably? 10 minutes    How long can you walk comfortably? 15-20 minutes    Diagnostic tests MRI - patient reports no new abnormalities    Patient Stated Goals Improve low back/leg pain and stretch, return to work    Currently in Pain? Yes    Pain Score 3     Pain Location Back    Pain Orientation Right    Pain Descriptors / Indicators  Aching    Pain Type Chronic pain    Pain Radiating Towards into the right leg    Pain Onset More than a month ago    Pain Frequency Constant    Aggravating Factors  sitting and bending    Pain Relieving Factors medication    Effect of Pain on Daily Activities patient limited with mobility                             [facility] Adult PT Treatment/Exercise - 11/02/19 0001      Lumbar Exercises: Stretches   Lower Trunk Rotation Limitations 1 x 20    Piriformis Stretch 3 reps;30 seconds;Right;Left    Other Lumbar Stretch Exercise prayer stretch and lateral 2x20 sec hold; sink stretch 2x20 sec hold       Lumbar Exercises: Aerobic   Nustep L5 x 5 min      Lumbar Exercises: Standing   Row Limitations x20 green with cuing for core     Shoulder Extension Limitations x20 green     Other Standing Lumbar Exercises diagonals green 2x10 each direction     Other Standing Lumbar Exercises pallof press 2x10  Lumbar Exercises: Seated   Other Seated Lumbar Exercises hip abdcuction 2x10 green       Lumbar Exercises: Supine   Clam 20 reps      Manual Therapy   Manual Therapy Soft tissue mobilization;Joint mobilization;Myofascial release;Neural Stretch    Manual therapy comments skilled palpation and monitoring during TPDN    Soft tissue mobilization IASTM along bil lumbar parapsinals   tack and stretch of R piriformis   Myofascial Release fascial stretching and rolling    Neural Stretch --   cues for slow controlled motion.           Trigger Point Dry Needling - 11/02/19 0001    Dry Needling Comments 2 spots on either side of the lumbar spine    Lumbar multifidi Response Twitch response elicited;Palpable increased muscle length                 PT Education - 11/02/19 1652    Education Details HEP and symptom mangement    Person(s) Educated Patient    Methods Explanation;Demonstration;Verbal cues;Tactile cues    Comprehension Verbalized understanding;Returned  demonstration;Verbal cues required;Tactile cues required            PT Short Term Goals - 10/19/19 1453      PT SHORT TERM GOAL #1   Title Patient will be I with initial HEP to progress strength and mobility    Baseline perfroming log roll without pain    Time 4    Period Weeks    Status On-going    Target Date 11/02/19      PT SHORT TERM GOAL #2   Title Patient will demonstrate proper lifting mechanics using hip hinge technique and maintaining neutral lumbar spine to reduce pain getting light objects out of fridge    Baseline able to tranfer 10x without hands    Time 4    Period Weeks    Status On-going    Target Date 11/02/19      PT SHORT TERM GOAL #3   Title Patient will ambualte 300' with [facility] with good safety and stability    Baseline No device    Time 3    Period Weeks    Status Achieved    Target Date 05/27/19             PT Long Term Goals - 10/05/19 1327      PT LONG TERM GOAL #1   Title Patient will be I with final HEP to maintain progress from PT    Time 8    Period Weeks    Status New    Target Date 11/30/19      PT LONG TERM GOAL #2   Title Patient will demonstrate improved gross hip and core strength to >/= 4+/5 MMT in order to improve lifting and walking tolerance    Time 8    Period Weeks    Status New    Target Date 11/30/19      PT LONG TERM GOAL #3   Title Patient will be able to tolerate >/= 30 minutes of walking in order to improve community access    Time 8    Period Weeks    Status New    Target Date 11/30/19      PT LONG TERM GOAL #4   Title Patient will report improve seated tolerance to >/= 1 hour without needing to stand/stretch in order to improve driving ability    Time 8    Period  Weeks    Status New    Target Date 11/30/19      PT LONG TERM GOAL #5   Title Patient will be able to proper lift >/= 30 lbs in order to improve performance of household tasks and return to work    Time 8    Period Weeks    Status New     Target Date 11/30/19                 Plan - 11/02/19 1652    Clinical Impression Statement Patient continues to get benefit out of needling. He feles like he managing his pain better. he is only having increased pain with increased activity. Patient perfromed moderate level ther-ex today. We will continue to progress as tolerated.    Personal Factors and Comorbidities Time since onset of injury/illness/exacerbation;Comorbidity 1;Fitness;Past/Current Experience;Finances    Comorbidities Anxiety/depression    Examination-Activity Limitations Locomotion Level;Sit;Stand;Lift;Bend    Examination-Participation Restrictions Occupation;Community Activity;Driving;Laundry;Yard Work    [employer] Evolving/Moderate complexity    Clinical Decision Making Moderate    Rehab Potential Good    PT Frequency 1x / week    PT Duration 8 weeks    PT Treatment/Interventions ADLs/Self Care Home Management;Aquatic Therapy;Cryotherapy;Electrical Stimulation;Moist Heat;Neuromuscular re-education;Balance training;Therapeutic exercise;Therapeutic activities;Functional mobility training;Stair training;Gait training;Patient/family education;Manual techniques;Dry needling;Passive range of motion;Taping;Spinal Manipulations;Joint Manipulations    PT Next Visit Plan Review HEP and progress PRN, progress core and hip strength, lumbopelvic control, lifting mechanics, responsed to DN, how was nerve glide/ flossing.    PT Home Exercise Plan [id number]: SLR with core activation, sidelying hip abduction, prone hip extension with 2 pillows under hips, bird dog (modified to prone vs quadruped), supine alternating leg lifts    Consulted and Agree with Plan of Care Patient           Patient will benefit from skilled therapeutic intervention in order to improve the following deficits and impairments:  Decreased range of motion, Difficulty walking, Decreased activity tolerance, Pain, Improper body mechanics,  Impaired flexibility, Decreased strength, Postural dysfunction  Visit Diagnosis: Chronic bilateral low back pain with right-sided sciatica  Muscle weakness (generalized)  Other abnormalities of gait and mobility  Chronic bilateral low back pain without sciatica  Radiculopathy, lumbar region     Problem List Patient Active Problem List   Diagnosis Date Noted   Radiculopathy, lumbosacral region 08/23/2019   Gastroesophageal reflux disease without esophagitis 06/19/2017   Need for hepatitis A vaccination 06/19/2017   Primary insomnia 02/02/2017   Routine screening for STI (sexually transmitted infection) 06/07/2016   SVT (supraventricular tachycardia) ([place]) 10/08/2014   Depression 10/08/2014   Atypical nevi 06/22/2014   Elevated triglycerides with high cholesterol 09/06/2013   Anxiety state 08/03/2013   Essential hypertension, benign 08/03/2013   Environmental allergies 03/23/2013   Visit for preventive health examination 03/23/2013   Meniere disease 03/23/2013   Intrinsic asthma     Carney Living PT DPT  11/02/2019, 4:55 PM  [facility] [address] [place], [place], [address] Phone: [phone]   Fax:  [phone]  Name: Steve Jacobs MRN: [id number] Date of Birth: 1985/06/22

## 2019-11-09 ENCOUNTER — Other Ambulatory Visit: Payer: Self-pay

## 2019-11-09 ENCOUNTER — Encounter: Payer: Self-pay | Admitting: Physical Therapy

## 2019-11-09 ENCOUNTER — Ambulatory Visit: Payer: PRIVATE HEALTH INSURANCE | Attending: Orthopedic Surgery | Admitting: Physical Therapy

## 2019-11-09 DIAGNOSIS — M6281 Muscle weakness (generalized): Secondary | ICD-10-CM | POA: Insufficient documentation

## 2019-11-09 DIAGNOSIS — R2689 Other abnormalities of gait and mobility: Secondary | ICD-10-CM | POA: Diagnosis present

## 2019-11-09 DIAGNOSIS — M545 Low back pain, unspecified: Secondary | ICD-10-CM | POA: Diagnosis present

## 2019-11-09 DIAGNOSIS — G8929 Other chronic pain: Secondary | ICD-10-CM | POA: Diagnosis present

## 2019-11-10 ENCOUNTER — Encounter: Payer: Self-pay | Admitting: Physical Therapy

## 2019-11-10 NOTE — Therapy (Addendum)
[place], [place], [address] Phone: [phone]   Fax:  [phone]  Physical Therapy Treatment  Patient Details  Name: Steve Jacobs MRN: [id number] Date of Birth: 1985/09/27 Referring Provider (PT): Meda Coffee, MD   Encounter Date: 11/09/2019   PT End of Session - 11/09/19 1625    Visit Number 6    Number of Visits 9    Date for PT Re-Evaluation 12/07/19    Authorization Type --    Authorization - Visit Number 7    Authorization - Number of Visits 9    PT Start Time 0160    PT Stop Time 1458    PT Time Calculation (min) 43 min    Activity Tolerance Patient tolerated treatment well    Behavior During Therapy [facility] for tasks assessed/performed           Past Medical History:  Diagnosis Date  . Asthma   . Chicken pox   . Depression    Resolved  . Environmental and seasonal allergies   . Essential hypertension, benign 08/03/2013  . Hyperlipidemia   . Meniere's disease   . Thoracic outlet syndrome     Past Surgical History:  Procedure Laterality Date  . LUMBAR FUSION  04/22/2019   [facility] in [place]- 2 LEVEL FUSION  . Nevus Biopsy      There were no vitals filed for this visit.   Subjective Assessment - 11/09/19 1423    Subjective Patient has been more active this week. He has pain going down his right leg into his foot but it is tolerable.    Limitations Sitting;Standing;Walking;House hold activities;Lifting    How long can you sit comfortably? 15-20 minutes    How long can you stand comfortably? 10 minutes    How long can you walk comfortably? 15-20 minutes    Diagnostic tests MRI - patient reports no new abnormalities    Patient Stated Goals Improve low back/leg pain and stretch, return to work    Currently in Pain? Yes    Pain Score 3     Pain Location Back    Pain Orientation Right    Pain Descriptors / Indicators Aching    Pain Type Chronic pain    Pain Radiating  Towards into the right foot    Pain Onset More than a month ago    Pain Frequency Constant    Aggravating Factors  sitting and bending    Pain Relieving Factors medication    Effect of Pain on Daily Activities patient limited mobility                             [facility] Adult PT Treatment/Exercise - 11/10/19 0001      Lumbar Exercises: Aerobic   Nustep L9 x 43min; L5 x 48min      Lumbar Exercises: Standing   Other Standing Lumbar Exercises banded sidestepping    red band around knees, 20 foot distance     Lumbar Exercises: Supine   Clam 20 reps    Clam Limitations x30 blue band    Other Supine Lumbar Exercises Swiss Ball hip flexion and ab contraction    Other Supine Lumbar Exercises Marching feet with slight bilateral raise   2x20     Manual Therapy   Manual Therapy Soft tissue mobilization;Joint mobilization;Myofascial release;Neural Stretch    Manual therapy comments skilled palpation and monitoring during TPDN  Soft tissue mobilization IASTM along bil lumbar parapsinals   tack and stretch of R piriformis   Myofascial Release fascial stretching and rolling    Neural Stretch --   cues for slow controlled motion.           Trigger Point Dry Needling - 11/10/19 0001    Consent Given? Yes    Dry Needling Comments 2 spots on either side of the lumbar spine; 2 spots in right gluteal     Gluteus Medius Response Twitch response elicited    Lumbar multifidi Response Twitch response elicited;Palpable increased muscle length                PT Education - 11/09/19 1507    Education Details updated HEP    Person(s) Educated Patient    Methods Demonstration;Explanation;Tactile cues;Verbal cues    Comprehension Returned demonstration;Verbal cues required;Tactile cues required;Verbalized understanding            PT Short Term Goals - 10/19/19 1453      PT SHORT TERM GOAL #1   Title Patient will be I with initial HEP to progress strength and mobility     Baseline perfroming log roll without pain    Time 4    Period Weeks    Status On-going    Target Date 11/02/19      PT SHORT TERM GOAL #2   Title Patient will demonstrate proper lifting mechanics using hip hinge technique and maintaining neutral lumbar spine to reduce pain getting light objects out of fridge    Baseline able to tranfer 10x without hands    Time 4    Period Weeks    Status On-going    Target Date 11/02/19      PT SHORT TERM GOAL #3   Title Patient will ambualte 300' with [facility] with good safety and stability    Baseline No device    Time 3    Period Weeks    Status Achieved    Target Date 05/27/19             PT Long Term Goals - 10/05/19 1327      PT LONG TERM GOAL #1   Title Patient will be I with final HEP to maintain progress from PT    Time 8    Period Weeks    Status New    Target Date 11/30/19      PT LONG TERM GOAL #2   Title Patient will demonstrate improved gross hip and core strength to >/= 4+/5 MMT in order to improve lifting and walking tolerance    Time 8    Period Weeks    Status New    Target Date 11/30/19      PT LONG TERM GOAL #3   Title Patient will be able to tolerate >/= 30 minutes of walking in order to improve community access    Time 8    Period Weeks    Status New    Target Date 11/30/19      PT LONG TERM GOAL #4   Title Patient will report improve seated tolerance to >/= 1 hour without needing to stand/stretch in order to improve driving ability    Time 8    Period Weeks    Status New    Target Date 11/30/19      PT LONG TERM GOAL #5   Title Patient will be able to proper lift >/= 30 lbs in order to improve performance of  household tasks and return to work    Time 8    Period Weeks    Status New    Target Date 11/30/19                 Plan - 11/10/19 0916    Clinical Impression Statement Therapy gave patient lateral band walk for home. He felt a good burn but tolerated well. Therapy focused on matt  exercises. We also perfromed trigger point dry needling.  He hada good twtivch respose. He is making progress but continues to have radicualr pain when he perfroms ADL's    Personal Factors and Comorbidities Time since onset of injury/illness/exacerbation;Comorbidity 1;Fitness;Past/Current Experience;Finances    Comorbidities Anxiety/depression    Examination-Activity Limitations Locomotion Level;Sit;Stand;Lift;Bend    Examination-Participation Restrictions Occupation;Community Activity;Driving;Laundry;Yard Work    [employer] Evolving/Moderate complexity    Clinical Decision Making Moderate    Rehab Potential Good    PT Frequency 1x / week    PT Duration 8 weeks    PT Treatment/Interventions ADLs/Self Care Home Management;Aquatic Therapy;Cryotherapy;Electrical Stimulation;Moist Heat;Neuromuscular re-education;Balance training;Therapeutic exercise;Therapeutic activities;Functional mobility training;Stair training;Gait training;Patient/family education;Manual techniques;Dry needling;Passive range of motion;Taping;Spinal Manipulations;Joint Manipulations    PT Next Visit Plan Review HEP and progress PRN, progress core and hip strength, lumbopelvic control, lifting mechanics, responsed to DN, how was nerve glide/ flossing.    PT Home Exercise Plan [id number]: SLR with core activation, sidelying hip abduction, prone hip extension with 2 pillows under hips, bird dog (modified to prone vs quadruped), supine alternating leg lifts           Patient will benefit from skilled therapeutic intervention in order to improve the following deficits and impairments:  Decreased range of motion, Difficulty walking, Decreased activity tolerance, Pain, Improper body mechanics, Impaired flexibility, Decreased strength, Postural dysfunction  Visit Diagnosis: Muscle weakness (generalized)  Chronic bilateral low back pain without sciatica  Other abnormalities of gait and  mobility     Problem List Patient Active Problem List   Diagnosis Date Noted  . Radiculopathy, lumbosacral region 08/23/2019  . Gastroesophageal reflux disease without esophagitis 06/19/2017  . Need for hepatitis A vaccination 06/19/2017  . 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 Routine screening for STI (sexually transmitted infection) 06/07/2016  . SVT (supraventricular tachycardia) ([place]) 10/08/2014  . Depression 10/08/2014  . Atypical nevi 06/22/2014  . Elevated triglycerides with high cholesterol 09/06/2013  . Anxiety state 08/03/2013  . Essential hypertension, benign 08/03/2013  . Environmental allergies 03/23/2013  . Visit for preventive health examination 03/23/2013  . Meniere disease 03/23/2013  . Intrinsic asthma     Carney Living PT DPT  11/10/2019, 9:22 AM  [facility] [address] [place], [place], [address] Phone: [phone]   Fax:  [phone]  Name: Steve Jacobs MRN: [id number] Date of Birth: 21-Mar-1985

## 2019-11-11 ENCOUNTER — Other Ambulatory Visit: Payer: Self-pay | Admitting: Physician Assistant

## 2019-11-11 ENCOUNTER — Encounter: Payer: Self-pay | Admitting: Physician Assistant

## 2019-11-16 ENCOUNTER — Other Ambulatory Visit: Payer: Self-pay | Admitting: Physician Assistant

## 2019-11-16 MED FILL — TRIAMTERENE/HCTZ 37.5/25 CP: 37.5-25 | 90 days supply | Qty: 90 | Fill #1

## 2019-11-16 MED FILL — LEVOCETIRIZINE 5 MG TABLET: 5 | 30 days supply | Qty: 30 | Fill #2

## 2019-11-16 MED FILL — MONTELUKAST SOD 10 MG TAB: 10 | 90 days supply | Qty: 90 | Fill #0

## 2019-11-18 ENCOUNTER — Encounter: Payer: Self-pay | Admitting: Physical Therapy

## 2019-11-18 ENCOUNTER — Other Ambulatory Visit: Payer: Self-pay

## 2019-11-18 ENCOUNTER — Ambulatory Visit: Payer: PRIVATE HEALTH INSURANCE | Attending: Orthopedic Surgery | Admitting: Physical Therapy

## 2019-11-18 DIAGNOSIS — R2689 Other abnormalities of gait and mobility: Secondary | ICD-10-CM | POA: Diagnosis present

## 2019-11-18 DIAGNOSIS — M6281 Muscle weakness (generalized): Secondary | ICD-10-CM | POA: Diagnosis present

## 2019-11-18 DIAGNOSIS — M5416 Radiculopathy, lumbar region: Secondary | ICD-10-CM | POA: Diagnosis present

## 2019-11-18 DIAGNOSIS — M545 Low back pain, unspecified: Secondary | ICD-10-CM | POA: Insufficient documentation

## 2019-11-18 DIAGNOSIS — G8929 Other chronic pain: Secondary | ICD-10-CM | POA: Insufficient documentation

## 2019-11-18 DIAGNOSIS — M5441 Lumbago with sciatica, right side: Secondary | ICD-10-CM | POA: Insufficient documentation

## 2019-11-18 NOTE — Therapy (Signed)
[place], [place], [address] Phone: [phone]   Fax:  [phone]  Physical Therapy Treatment  Patient Details  Name: Steve Jacobs MRN: [id number] Date of Birth: Oct 08, 1985 Referring Provider (PT): Meda Coffee, MD   Encounter Date: 11/18/2019   PT End of Session - 11/18/19 1546    Visit Number 7    Number of Visits 9    Date for PT Re-Evaluation 12/07/19    Authorization Type Worker's comp: 1 evaluation and 8 treatments approved    Authorization - Visit Number 8    Authorization - Number of Visits 9    PT Start Time 2409    PT Stop Time 1630    PT Time Calculation (min) 45 min    Activity Tolerance Patient tolerated treatment well    Behavior During Therapy [facility] for tasks assessed/performed           Past Medical History:  Diagnosis Date  . Asthma   . Chicken pox   . Depression    Resolved  . Environmental and seasonal allergies   . Essential hypertension, benign 08/03/2013  . Hyperlipidemia   . Meniere's disease   . Thoracic outlet syndrome     Past Surgical History:  Procedure Laterality Date  . LUMBAR FUSION  04/22/2019   [facility] in [place]- 2 LEVEL FUSION  . Nevus Biopsy      There were no vitals filed for this visit.   Subjective Assessment - 11/18/19 1549    Subjective "I noticed some soreness in the leg last night while I was sleeping and I had to take all my medication to calm it down, and I feel like it could catch at any momement."    Patient Stated Goals Improve low back/leg pain and stretch, return to work    Currently in Pain? Yes    Pain Score 3     Pain Orientation Right    Pain Descriptors / Indicators Aching;Sore    Pain Type Chronic pain    Pain Onset More than a month ago    Pain Frequency Intermittent    Aggravating Factors  sitting/ standing / laying down    Pain Relieving Factors medicaiton    Effect of Pain on Daily Activities patient               [facility] PT Assessment - 11/18/19 0001      Assessment   Medical Diagnosis L4-S1 Fusion    Referring Provider (PT) Meda Coffee, MD                         [facility] Adult PT Treatment/Exercise - 11/18/19 0001      Lumbar Exercises: Stretches   Lower Trunk Rotation Limitations 1 x 10 bil      Lumbar Exercises: Aerobic   Elliptical L1 x 6 min Ramp L1      Shoulder Exercises: Power [employer] 10 reps   wide grip x 2sets 10#   Row Limitations cues to avoid hiking the shoulder    Other Power Tower Exercises chest press 2 x 10 15# with narrow grip    Other Power [employer] Exercises Lat pull down 2 x 10 10#    cues for proper form/ technique     Manual Therapy   Neural Stretch sciatic nerve glides in seated/ supine 2 x 20 ea.  PT Education - 11/18/19 1632    Education Details reviewed HEP and updated today for nerve glides in sitting/ supine.  Discussed benefits of controlled strengthening especially utilzing gym equipment but focusing on light weight and proper form before adding weight/ reps.    Person(s) Educated Patient    Methods Explanation;Verbal cues;Handout    Comprehension Verbalized understanding            PT Short Term Goals - 10/19/19 1453      PT SHORT TERM GOAL #1   Title Patient will be I with initial HEP to progress strength and mobility    Baseline perfroming log roll without pain    Time 4    Period Weeks    Status On-going    Target Date 11/02/19      PT SHORT TERM GOAL #2   Title Patient will demonstrate proper lifting mechanics using hip hinge technique and maintaining neutral lumbar spine to reduce pain getting light objects out of fridge    Baseline able to tranfer 10x without hands    Time 4    Period Weeks    Status On-going    Target Date 11/02/19      PT SHORT TERM GOAL #3   Title Patient will ambualte 300' with [facility] with good safety and stability    Baseline No device    Time 3    Period Weeks     Status Achieved    Target Date 05/27/19             PT Long Term Goals - 10/05/19 1327      PT LONG TERM GOAL #1   Title Patient will be I with final HEP to maintain progress from PT    Time 8    Period Weeks    Status New    Target Date 11/30/19      PT LONG TERM GOAL #2   Title Patient will demonstrate improved gross hip and core strength to >/= 4+/5 MMT in order to improve lifting and walking tolerance    Time 8    Period Weeks    Status New    Target Date 11/30/19      PT LONG TERM GOAL #3   Title Patient will be able to tolerate >/= 30 minutes of walking in order to improve community access    Time 8    Period Weeks    Status New    Target Date 11/30/19      PT LONG TERM GOAL #4   Title Patient will report improve seated tolerance to >/= 1 hour without needing to stand/stretch in order to improve driving ability    Time 8    Period Weeks    Status New    Target Date 11/30/19      PT LONG TERM GOAL #5   Title Patient will be able to proper lift >/= 30 lbs in order to improve performance of household tasks and return to work    Time 8    Period Weeks    Status New    Target Date 11/30/19                 Plan - 11/18/19 1634    Clinical Impression Statement pt arrives reporting 3/10 pain noting increased RLE referred pain last night requiring medication to help control pain. reviewed scaitica nerve glides in seated and supine to reduced nerve aggrivation. Focused session on exercises using gym equipment with light  weight and controlled movement. He responded well to exercise and would benefit from progressing to gym exercise once he is discharged from PT to hep bridge the gap between physical therpay , plan to see pt for one more visit and review HEP/ update addressing any questions and discharge to independent exercise.    PT Treatment/Interventions ADLs/Self Care Home Management;Aquatic Therapy;Cryotherapy;Electrical Stimulation;Moist Heat;Neuromuscular  re-education;Balance training;Therapeutic exercise;Therapeutic activities;Functional mobility training;Stair training;Gait training;Patient/family education;Manual techniques;Dry needling;Passive range of motion;Taping;Spinal Manipulations;Joint Manipulations    PT Next Visit Plan Review HEP and update for gym specific exericse, ROM, goals.    PT Home Exercise Plan [id number]: SLR with core activation, sidelying hip abduction, prone hip extension with 2 pillows under hips, bird dog (modified to prone vs quadruped), supine alternating leg lifts, nerve glides/ flossing    Consulted and Agree with Plan of Care Patient           Patient will benefit from skilled therapeutic intervention in order to improve the following deficits and impairments:  Decreased range of motion, Difficulty walking, Decreased activity tolerance, Pain, Improper body mechanics, Impaired flexibility, Decreased strength, Postural dysfunction  Visit Diagnosis: Muscle weakness (generalized)  Chronic bilateral low back pain without sciatica  Other abnormalities of gait and mobility  Chronic bilateral low back pain with right-sided sciatica  Radiculopathy, lumbar region     Problem List Patient Active Problem List   Diagnosis Date Noted  . Radiculopathy, lumbosacral region 08/23/2019  . Gastroesophageal reflux disease without esophagitis 06/19/2017  . Need for hepatitis A vaccination 06/19/2017  . 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 Routine screening for STI (sexually transmitted infection) 06/07/2016  . SVT (supraventricular tachycardia) ([place]) 10/08/2014  . Depression 10/08/2014  . Atypical nevi 06/22/2014  . Elevated triglycerides with high cholesterol 09/06/2013  . Anxiety state 08/03/2013  . Essential hypertension, benign 08/03/2013  . Environmental allergies 03/23/2013  . Visit for preventive health examination 03/23/2013  . Meniere disease 03/23/2013  . Intrinsic asthma    Starr Lake PT, DPT, LAT, ATC   11/18/19  4:45 PM      [facility] [address] [place], [place], [address] Phone: [phone]   Fax:  [phone]  Name: Steve Jacobs MRN: [id number] Date of Birth: 12/03/1985

## 2019-11-23 ENCOUNTER — Other Ambulatory Visit: Payer: Self-pay

## 2019-11-23 ENCOUNTER — Encounter: Payer: Self-pay | Admitting: Physical Therapy

## 2019-11-23 ENCOUNTER — Ambulatory Visit: Payer: PRIVATE HEALTH INSURANCE | Attending: Orthopedic Surgery | Admitting: Physical Therapy

## 2019-11-23 DIAGNOSIS — M5416 Radiculopathy, lumbar region: Secondary | ICD-10-CM | POA: Insufficient documentation

## 2019-11-23 DIAGNOSIS — M5441 Lumbago with sciatica, right side: Secondary | ICD-10-CM | POA: Diagnosis present

## 2019-11-23 DIAGNOSIS — M6281 Muscle weakness (generalized): Secondary | ICD-10-CM | POA: Insufficient documentation

## 2019-11-23 DIAGNOSIS — R2689 Other abnormalities of gait and mobility: Secondary | ICD-10-CM | POA: Insufficient documentation

## 2019-11-23 DIAGNOSIS — M545 Low back pain, unspecified: Secondary | ICD-10-CM | POA: Diagnosis present

## 2019-11-23 DIAGNOSIS — G8929 Other chronic pain: Secondary | ICD-10-CM | POA: Diagnosis present

## 2019-11-24 ENCOUNTER — Encounter: Payer: Self-pay | Admitting: Physical Therapy

## 2019-11-24 NOTE — Therapy (Signed)
[place], [place], [address] Phone: [phone]   Fax:  [phone]  Physical Therapy Treatment/Discharge   Patient Details  Name: Steve Jacobs MRN: [id number] Date of Birth: 11-11-1985 Referring Provider (PT): Meda Coffee, MD   Encounter Date: 11/23/2019   PT End of Session - 11/23/19 1510    Visit Number 8    Number of Visits 9    Date for PT Re-Evaluation 12/07/19    Authorization Type Worker's comp: 1 evaluation and 8 treatments approved    Authorization - Visit Number 9    Authorization - Number of Visits 9    PT Start Time 1503    PT Stop Time 1545    PT Time Calculation (min) 42 min    Activity Tolerance Patient tolerated treatment well    Behavior During Therapy [facility] for tasks assessed/performed           Past Medical History:  Diagnosis Date  . Asthma   . Chicken pox   . Depression    Resolved  . Environmental and seasonal allergies   . Essential hypertension, benign 08/03/2013  . Hyperlipidemia   . Meniere's disease   . Thoracic outlet syndrome     Past Surgical History:  Procedure Laterality Date  . LUMBAR FUSION  04/22/2019   [facility] in [place]- 2 LEVEL FUSION  . Nevus Biopsy      There were no vitals filed for this visit.   Subjective Assessment - 11/23/19 1508    Subjective Patient goes back to see MD next Monday. He cralwed under the house to change an air-fil;ter. He feels like his back is sore today.    Limitations Sitting;Standing;Walking;House hold activities;Lifting    How long can you sit comfortably? 15-20 minutes    How long can you stand comfortably? 10 minutes    How long can you walk comfortably? 15-20 minutes    Diagnostic tests MRI - patient reports no new abnormalities    Patient Stated Goals Improve low back/leg pain and stretch, return to work    Currently in Pain? Yes    Pain Score 3     Pain Location Back    Pain Orientation Right    Pain  Descriptors / Indicators Aching    Pain Type Chronic pain    Pain Onset More than a month ago    Pain Frequency Intermittent    Aggravating Factors  sitting and standing    Pain Relieving Factors medication    Effect of Pain on Daily Activities patient              [facility] PT Assessment - 11/24/19 0001      AROM   Lumbar Flexion 70    Lumbar - Right Rotation 25%     Lumbar - Left Rotation 25%      Strength   Right Hip Flexion 4+/5    Right Hip Extension 4/5    Right Hip ABduction 4+/5    Right Hip ADduction 4+/5    Left Hip Flexion 4+/5    Left Hip ABduction 4+/5    Left Hip ADduction 4+/5    Right Knee Flexion 5/5    Right Knee Extension 5/5    Left Knee Flexion 5/5                         [facility] Adult PT Treatment/Exercise - 11/24/19 0001  Self-Care   Other Self-Care Comments  reviewed progression of exercises. Reviewed lifting techniuqe. Reviewed progression of mobility over time from intial eval until now. Reviewed how to use his legs to lift.       Lumbar Exercises: Stretches   Lower Trunk Rotation Limitations 1 x 10 bil    Piriformis Stretch 3 reps;30 seconds;Right;Left      Lumbar Exercises: Aerobic   Nustep L5 x5 min       Lumbar Exercises: Supine   Clam 20 reps    Clam Limitations x30 blue band    Other Supine Lumbar Exercises Marching feet with slight bilateral raise   2x20     Shoulder Exercises: Power [employer] 10 reps   wide grip x 2sets 10#   Row Limitations cues to avoid hiking the shoulder    Other Power Tower Exercises chest press 2 x 10 15# with narrow grip    Other Power [employer] Exercises Lat pull down 2 x 10 10#    cues for proper form/ technique     Manual Therapy   Soft tissue mobilization --   tack and stretch of R piriformis   Neural Stretch --   cues for slow controlled motion.                 PT Education - 11/23/19 1509    Education Details reviewed final HEP    Person(s) Educated Patient    Methods  Explanation;Demonstration;Verbal cues;Tactile cues    Comprehension Verbalized understanding;Returned demonstration;Verbal cues required;Tactile cues required            PT Short Term Goals - 10/19/19 1453      PT SHORT TERM GOAL #1   Title Patient will be I with initial HEP to progress strength and mobility    Baseline perfroming log roll without pain    Time 4    Period Weeks    Status On-going    Target Date 11/02/19      PT SHORT TERM GOAL #2   Title Patient will demonstrate proper lifting mechanics using hip hinge technique and maintaining neutral lumbar spine to reduce pain getting light objects out of fridge    Baseline able to tranfer 10x without hands    Time 4    Period Weeks    Status On-going    Target Date 11/02/19      PT SHORT TERM GOAL #3   Title Patient will ambualte 300' with [facility] with good safety and stability    Baseline No device    Time 3    Period Weeks    Status Achieved    Target Date 05/27/19             PT Long Term Goals - 10/05/19 1327      PT LONG TERM GOAL #1   Title Patient will be I with final HEP to maintain progress from PT    Time 8    Period Weeks    Status New    Target Date 11/30/19      PT LONG TERM GOAL #2   Title Patient will demonstrate improved gross hip and core strength to >/= 4+/5 MMT in order to improve lifting and walking tolerance    Time 8    Period Weeks    Status New    Target Date 11/30/19      PT LONG TERM GOAL #3   Title Patient will be able to tolerate >/= 30 minutes  of walking in order to improve community access    Time 8    Period Weeks    Status New    Target Date 11/30/19      PT LONG TERM GOAL #4   Title Patient will report improve seated tolerance to >/= 1 hour without needing to stand/stretch in order to improve driving ability    Time 8    Period Weeks    Status New    Target Date 11/30/19      PT LONG TERM GOAL #5   Title Patient will be able to proper lift >/= 30 lbs in order to  improve performance of household tasks and return to work    Time 8    Period Weeks    Status New    Target Date 11/30/19                 Plan - 11/24/19 1315    Clinical Impression Statement Patiet has reach max potential for PT. He continues to have pain at times but he is able to self manage. He has a full exercise program. Therapy reviewed gym exercises within lifting restrictions. He has spasming when he does too much activity but it does not last. He is still unable to perfrom all work tasks. He was advised to continue with his exercise for a period of time and progressing per MD reccomendations.    Personal Factors and Comorbidities Time since onset of injury/illness/exacerbation;Comorbidity 1;Fitness;Past/Current Experience;Finances    Examination-Activity Limitations Locomotion Level;Sit;Stand;Lift;Bend    Examination-Participation Restrictions Occupation;Community Activity;Driving;Laundry;Yard Work    [employer] Evolving/Moderate complexity    Clinical Decision Making Moderate    Rehab Potential Good    PT Frequency 1x / week    PT Duration 8 weeks    PT Treatment/Interventions ADLs/Self Care Home Management;Aquatic Therapy;Cryotherapy;Electrical Stimulation;Moist Heat;Neuromuscular re-education;Balance training;Therapeutic exercise;Therapeutic activities;Functional mobility training;Stair training;Gait training;Patient/family education;Manual techniques;Dry needling;Passive range of motion;Taping;Spinal Manipulations;Joint Manipulations    PT Next Visit Plan Review HEP and update for gym specific exericse, ROM, goals.    PT Home Exercise Plan [id number]: SLR with core activation, sidelying hip abduction, prone hip extension with 2 pillows under hips, bird dog (modified to prone vs quadruped), supine alternating leg lifts, nerve glides/ flossing    Consulted and Agree with Plan of Care Patient           Patient will benefit from skilled therapeutic  intervention in order to improve the following deficits and impairments:  Decreased range of motion, Difficulty walking, Decreased activity tolerance, Pain, Improper body mechanics, Impaired flexibility, Decreased strength, Postural dysfunction  Visit Diagnosis: Muscle weakness (generalized)  Chronic bilateral low back pain without sciatica  Other abnormalities of gait and mobility  Chronic bilateral low back pain with right-sided sciatica  Radiculopathy, lumbar region     Problem List Patient Active Problem List   Diagnosis Date Noted  . Radiculopathy, lumbosacral region 08/23/2019  . Gastroesophageal reflux disease without esophagitis 06/19/2017  . Need for hepatitis A vaccination 06/19/2017  . 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 Routine screening for STI (sexually transmitted infection) 06/07/2016  . SVT (supraventricular tachycardia) ([place]) 10/08/2014  . Depression 10/08/2014  . Atypical nevi 06/22/2014  . Elevated triglycerides with high cholesterol 09/06/2013  . Anxiety state 08/03/2013  . Essential hypertension, benign 08/03/2013  . Environmental allergies 03/23/2013  . Visit for preventive health examination 03/23/2013  . Meniere disease 03/23/2013  . Intrinsic asthma     Carney Living PT DPT  11/24/2019,  1:21 PM  [place], [place], [address] Phone: [phone]   Fax:  [phone]  Name: Steve Jacobs MRN: [id number] Date of Birth: 11-Dec-1985

## 2019-11-29 ENCOUNTER — Other Ambulatory Visit (HOSPITAL_COMMUNITY): Payer: Self-pay | Admitting: Orthopedic Surgery

## 2019-11-29 MED FILL — METHOCARBAMOL 750 MG TABS: 750 | 30 days supply | Qty: 90 | Fill #0

## 2019-12-07 ENCOUNTER — Other Ambulatory Visit: Payer: Self-pay | Admitting: Physician Assistant

## 2019-12-07 MED FILL — diazePAM 2 MG TABS: 2 | 30 days supply | Qty: 60 | Fill #0

## 2019-12-07 MED FILL — ESZOPICLONE 2 MG TAB: 2 | 30 days supply | Qty: 30 | Fill #1

## 2019-12-07 NOTE — Telephone Encounter (Signed)
LFD 10/31/19 #60 with no refills LOV 10/29/19 NOV none

## 2019-12-13 ENCOUNTER — Ambulatory Visit: Payer: No Typology Code available for payment source | Admitting: Psychology

## 2019-12-20 ENCOUNTER — Other Ambulatory Visit: Payer: Self-pay | Admitting: Physician Assistant

## 2019-12-20 DIAGNOSIS — F411 Generalized anxiety disorder: Secondary | ICD-10-CM

## 2019-12-20 DIAGNOSIS — I471 Supraventricular tachycardia, unspecified: Secondary | ICD-10-CM

## 2019-12-20 MED FILL — LEVOCETIRIZINE 5 MG TABLET: 5 | 30 days supply | Qty: 30 | Fill #3

## 2019-12-20 MED FILL — METOPROLOL SUCCINATE ER 50: 50 | 90 days supply | Qty: 90 | Fill #0

## 2019-12-20 MED FILL — FINASTERIDE 1 MG TABLET: 1 | 90 days supply | Qty: 90 | Fill #0

## 2019-12-27 ENCOUNTER — Ambulatory Visit: Payer: No Typology Code available for payment source | Admitting: Psychology

## 2019-12-27 ENCOUNTER — Ambulatory Visit (INDEPENDENT_AMBULATORY_CARE_PROVIDER_SITE_OTHER): Payer: No Typology Code available for payment source | Admitting: Psychology

## 2019-12-27 DIAGNOSIS — F419 Anxiety disorder, unspecified: Secondary | ICD-10-CM

## 2019-12-30 ENCOUNTER — Encounter: Payer: Self-pay | Admitting: Physician Assistant

## 2020-01-04 ENCOUNTER — Other Ambulatory Visit: Payer: Self-pay | Admitting: Physician Assistant

## 2020-01-04 MED FILL — PROMETHAZINE 25 MG TABLET: 25 | 7 days supply | Qty: 20 | Fill #0

## 2020-01-04 MED FILL — diazePAM 2 MG TABS: 2 | 30 days supply | Qty: 60 | Fill #0

## 2020-01-04 MED FILL — ESZOPICLONE 2 MG TAB: 2 | 30 days supply | Qty: 30 | Fill #0

## 2020-01-04 NOTE — Telephone Encounter (Signed)
Promethazine LFD 07/13/19 #20 with no refills Diazepam LFD 12/07/19 #60 with no refills Lunesta LFD m10/17/21 #30 with 1 refill LOV 10/29/19 NOV none

## 2020-01-05 ENCOUNTER — Other Ambulatory Visit (HOSPITAL_COMMUNITY): Payer: Self-pay | Admitting: Orthopedic Surgery

## 2020-01-05 MED FILL — PREGABALIN 50 MG CAPS: 50 | 15 days supply | Qty: 30 | Fill #0

## 2020-01-05 MED FILL — METHOCARBAMOL 750 MG TABS: 750 | 30 days supply | Qty: 90 | Fill #0

## 2020-01-10 ENCOUNTER — Other Ambulatory Visit: Payer: Self-pay | Admitting: Physician Assistant

## 2020-01-10 MED FILL — ATORVASTATIN CALCIUM 20 MG: 20 | 90 days supply | Qty: 90 | Fill #0

## 2020-01-10 MED FILL — PARoxetine HCL 30 MG TABS: 30 | 90 days supply | Qty: 90 | Fill #0

## 2020-01-10 MED FILL — OMEPRAZOLE DR 20 MG CAPSULE: 20 | 90 days supply | Qty: 90 | Fill #0

## 2020-01-13 ENCOUNTER — Other Ambulatory Visit (HOSPITAL_COMMUNITY): Payer: Self-pay | Admitting: General Surgery

## 2020-01-13 ENCOUNTER — Emergency Department (HOSPITAL_COMMUNITY)
Admission: EM | Admit: 2020-01-13 | Discharge: 2020-01-13 | Disposition: A | Discharge status: Home / Self Care | Payer: PRIVATE HEALTH INSURANCE | Attending: Emergency Medicine | Admitting: Emergency Medicine

## 2020-01-13 DIAGNOSIS — I10 Essential (primary) hypertension: Secondary | ICD-10-CM | POA: Diagnosis not present

## 2020-01-13 DIAGNOSIS — R339 Retention of urine, unspecified: Secondary | ICD-10-CM | POA: Diagnosis not present

## 2020-01-13 DIAGNOSIS — J45909 Unspecified asthma, uncomplicated: Secondary | ICD-10-CM | POA: Diagnosis not present

## 2020-01-13 DIAGNOSIS — Z79899 Other long term (current) drug therapy: Secondary | ICD-10-CM | POA: Diagnosis not present

## 2020-01-13 DIAGNOSIS — R1030 Lower abdominal pain, unspecified: Secondary | ICD-10-CM | POA: Diagnosis present

## 2020-01-13 MED ORDER — FENTANYL CITRATE (PF) 100 MCG/2ML IJ SOLN: 50.0000 ug | Freq: Once | INTRAMUSCULAR | Status: DC

## 2020-01-13 MED FILL — IBUPROFEN 800 MG TABS: 800 | 20 days supply | Qty: 60 | Fill #0

## 2020-01-13 MED FILL — HYDROCODON-APAP 5-325: 5-325 | 2 days supply | Qty: 20 | Fill #0

## 2020-01-13 MED FILL — GABAPENTIN 100 MG CAPSULE: 100 | 30 days supply | Qty: 90 | Fill #0

## 2020-01-13 NOTE — ED Notes (Signed)
Pt holds his breath when in pain & O2 sat is at 85%. Pt placed on 2L O2  for comfort per RN, Melissa. O2 sat is now 94%.

## 2020-01-13 NOTE — ED Notes (Signed)
Patient has finished 5 cups of soda and water. Ambulating to the bathroom with steady and even gait at this time.

## 2020-01-13 NOTE — ED Provider Notes (Signed)
[facility] EMERGENCY DEPARTMENT Provider Note   CSN: [id number] Arrival date & time: 01/13/20  1456     History Chief Complaint  Patient presents with  . Abdominal Pain    Steve Jacobs is a 34 y.o. male.  Patient here with pain around his surgical site lower abdomen after hernia surgery today. Patient had left inguinal hernia repair at [facility]. He was discharged after the surgery. He comes here with pain around the surgical site and lower bladder. Has not had any urine after surgery. Felt extreme pain and difficulty walking. Felt sweaty and lightheaded.  The history is provided by the patient.  Abdominal Pain Pain location:  Suprapubic Pain quality: aching and bloating   Pain radiates to:  Does not radiate Pain severity:  Mild Onset quality:  Gradual Timing:  Constant Relieved by:  Nothing Worsened by:  Nothing Associated symptoms: no chest pain, no chills, no cough, no dysuria, no fever, no hematuria, no nausea, no shortness of breath, no sore throat and no vomiting   Risk factors: multiple surgeries        Past Medical History:  Diagnosis Date  . Asthma   . Chicken pox   . Depression    Resolved  . Environmental and seasonal allergies   . Essential hypertension, benign 08/03/2013  . Hyperlipidemia   . Meniere's disease   . Thoracic outlet syndrome     Patient Active Problem List   Diagnosis Date Noted  . Radiculopathy, lumbosacral region 08/23/2019  . Gastroesophageal reflux disease without esophagitis 06/19/2017  . Need for hepatitis A vaccination 06/19/2017  . 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 Routine screening for STI (sexually transmitted infection) 06/07/2016  . SVT (supraventricular tachycardia) (HCC) 10/08/2014  . Depression 10/08/2014  . Atypical nevi 06/22/2014  . Elevated triglycerides with high cholesterol 09/06/2013  . Anxiety state 08/03/2013  . Essential hypertension, benign 08/03/2013  . Environmental allergies 03/23/2013   . Visit for preventive health examination 03/23/2013  . Meniere disease 03/23/2013  . Intrinsic asthma     Past Surgical History:  Procedure Laterality Date  . LUMBAR FUSION  04/22/2019   [facility] in [place]- 2 LEVEL FUSION  . Nevus Biopsy         Family History  Problem Relation Age of Onset  . Hyperlipidemia Father        Living  . Stroke Father   . Hypertension Father   . Alcohol abuse Mother        Living  . Diabetes Mellitus II Maternal Grandfather   . Hypertension Maternal Grandfather   . Heart failure Maternal Grandfather   . Kidney disease Maternal Grandfather   . Heart disease Maternal Grandmother   . Melanoma Paternal Grandmother   . Heart attack Paternal Grandfather   . Migraines Brother   . Healthy Brother        x2  . Drug abuse Sister        Died of Overdose    Social History   Tobacco Use  . Smoking status: Never Smoker  . Smokeless tobacco: Never Used  Vaping Use  . Vaping Use: Never used  Substance Use Topics  . Alcohol use: Yes    Alcohol/week: 0.0 standard drinks    Comment: 2-3 beers nightly  . Drug use: No    Home Medications Prior to Admission medications   Medication Sig Start Date End Date Taking? Authorizing Provider  albuterol (VENTOLIN HFA) 108 (90 Base) MCG/ACT inhaler INHALE 2  PUFFS BY MOUTH EVERY 6 HOURS AS NEEDED FOR WHEEZING 01/05/19   Waldon Merl, PA-C  atorvastatin (LIPITOR) 20 MG tablet TAKE 1 TABLET (20 MG TOTAL) BY MOUTH DAILY. 01/10/20   Waldon Merl, PA-C  budesonide-formoterol (SYMBICORT) 80-4.5 MCG/ACT inhaler Inhale 2 puffs into the lungs 2 (two) times daily. 04/09/18   Waldon Merl, PA-C  cyclobenzaprine (FLEXERIL) 10 MG tablet Take 10 mg by mouth at bedtime. Patient not taking: Reported on 10/15/2019 09/06/19   [provider]  diazepam (VALIUM) 2 MG tablet TAKE 1 TABLET BY MOUTH EVERY 12 HOURS AS NEEDED FOR ANXIETY. 01/04/20   Waldon Merl, PA-C  diphenhydrAMINE (BENADRYL) 25 MG  tablet Take 25 mg by mouth every 6 (six) hours as needed for itching or allergies.    [provider]  eszopiclone (LUNESTA) 2 MG TABS tablet TAKE 1 TABLET (2 MG TOTAL) BY MOUTH AT BEDTIME AS NEEDED FOR SLEEP. TAKE IMMEDIATELY BEFORE BEDTIME 01/04/20   Waldon Merl, PA-C  fenofibrate (TRICOR) 145 MG tablet TAKE 1 TABLET (145 MG TOTAL) BY MOUTH DAILY. 10/22/19   Waldon Merl, PA-C  finasteride (PROPECIA) 1 MG tablet TAKE 1 TABLET (1 MG TOTAL) BY MOUTH DAILY. 12/20/19   Waldon Merl, PA-C  fluticasone (FLONASE) 50 MCG/ACT nasal spray Place 2 sprays into both nostrils daily. 06/18/18   Waldon Merl, PA-C  HYDROcodone-acetaminophen (NORCO/VICODIN) 5-325 MG tablet Take 1 tablet by mouth 3 (three) times daily as needed. Patient not taking: Reported on 10/15/2019 10/08/19   [provider]  levocetirizine (XYZAL) 5 MG tablet TAKE 1 TABLET BY MOUTH EVERY EVENING. 09/06/19   Waldon Merl, PA-C  methocarbamol (ROBAXIN) 750 MG tablet Take 750 mg by mouth 4 (four) times daily.    [provider]  methylPREDNISolone (MEDROL DOSEPAK) 4 MG TBPK tablet Take by mouth. Patient not taking: Reported on 10/15/2019 10/06/19   [provider]  metoprolol succinate (TOPROL-XL) 50 MG 24 hr tablet TAKE 1 TABLET (50 MG TOTAL) BY MOUTH DAILY. 12/20/19   Waldon Merl, PA-C  montelukast (SINGULAIR) 10 MG tablet TAKE 1 TABLET (10 MG TOTAL) BY MOUTH AT BEDTIME. 11/16/19   Sheliah Hatch, MD  Multiple Vitamin (MULTIVITAMIN WITH MINERALS) TABS tablet Take 1 tablet by mouth daily.    [provider]  omeprazole (PRILOSEC) 20 MG capsule TAKE 1 CAPSULE (20 MG TOTAL) BY MOUTH DAILY. 01/10/20   Waldon Merl, PA-C  PARoxetine (PAXIL) 30 MG tablet TAKE 1 TABLET (30 MG TOTAL) BY MOUTH DAILY. 01/10/20   Waldon Merl, PA-C  pregabalin (LYRICA) 150 MG capsule Take 150 mg by mouth 2 (two) times daily. 05/28/19   [provider]  pregabalin (LYRICA) 75 MG  capsule Take 75 mg by mouth 2 (two) times daily. 10/15/19   [provider]  promethazine (PHENERGAN) 25 MG tablet TAKE 1 TABLET BY MOUTH EVERY 8 HOURS AS NEEDED FOR NAUSEA OR VOMITING 01/04/20   Waldon Merl, PA-C  triamterene-hydrochlorothiazide (DYAZIDE) 37.5-25 MG capsule TAKE 1 CAPSULE BY MOUTH DAILY. 08/04/19   Waldon Merl, PA-C  VASCEPA 1 g capsule TAKE 2 CAPSULES BY MOUTH TWICE A DAY 11/11/19   Waldon Merl, PA-C    Allergies    Augmentin [amoxicillin-pot clavulanate], Belsomra [suvorexant], and Percocet [oxycodone-acetaminophen]  Review of Systems   Review of Systems  Constitutional: Positive for diaphoresis. Negative for chills and fever.  HENT: Negative for ear pain and sore throat.   Eyes: Negative for pain  and visual disturbance.  Respiratory: Negative for cough and shortness of breath.   Cardiovascular: Negative for chest pain and palpitations.  Gastrointestinal: Positive for abdominal pain. Negative for nausea and vomiting.  Genitourinary: Positive for difficulty urinating and testicular pain. Negative for dysuria and hematuria.  Musculoskeletal: Negative for arthralgias and back pain.  Skin: Negative for color change and rash.  Neurological: Positive for light-headedness. Negative for seizures and syncope.  All other systems reviewed and are negative.   Physical Exam Updated Vital Signs BP 106/82   Pulse 91   Temp 98.5 F (36.9 C) (Oral)   Resp 14   SpO2 94%   Physical Exam Vitals and nursing note reviewed.  Constitutional:      General: He is not in acute distress.    Appearance: He is well-developed and well-nourished. He is not ill-appearing.  HENT:     Head: Normocephalic and atraumatic.     Mouth/Throat:     Mouth: Mucous membranes are moist.  Eyes:     Extraocular Movements: Extraocular movements intact.     Conjunctiva/sclera: Conjunctivae normal.     Pupils: Pupils are equal, round, and reactive to light.  Cardiovascular:      Rate and Rhythm: Normal rate and regular rhythm.     Heart sounds: Normal heart sounds. No murmur heard.   Pulmonary:     Effort: Pulmonary effort is normal. No respiratory distress.     Breath sounds: Normal breath sounds.  Abdominal:     General: There is distension.     Palpations: Abdomen is soft.     Tenderness: There is abdominal tenderness in the suprapubic area. There is guarding.  Genitourinary:    Testes: Normal.        Right: Mass, tenderness or swelling not present.        Left: Mass, tenderness or swelling not present.     Comments: Left inguinal surgical site is clean dry and intact Musculoskeletal:        General: No edema.     Cervical back: Neck supple.  Skin:    General: Skin is warm and dry.     Capillary Refill: Capillary refill takes less than 2 seconds.  Neurological:     General: No focal deficit present.     Mental Status: He is alert.  Psychiatric:        Mood and Affect: Mood and affect normal.     ED Results / Procedures / Treatments   Labs (all labs ordered are listed, but only abnormal results are displayed) Labs Reviewed - No data to display  EKG None  Radiology No results found.  Procedures Procedures (including critical care time)  Medications Ordered in ED Medications - No data to display  ED Course  I have reviewed the triage vital signs and the nursing notes.  Pertinent labs & imaging results that were available during my care of the patient were reviewed by me and considered in my medical decision making (see chart for details).    MDM Rules/Calculators/A&P                          Steve Jacobs is a 34 year old male here with postsurgical pain. Normal vitals. No fever. Appears to have bladder distention on exam. Bladder scan showed around 1 L of urine. Felt extremely better after in and out cath that put out 1200 cc of urine. Pain is almost completely resolved. Surgical site in the left  inguinal area is clean dry and  intact. Patient has good pulses in his lower extremities. Overall suspect he had acute urinary retention secondary to anesthesia. He did not have a trial of void before being discharged today. Patient feels extremely better. Will give him something to eat and drink and have him have a trial of void while he is here.  Patient had successful trial of void.  Was discharged in ED in good condition.  This chart was dictated using voice recognition software.  Despite best efforts to proofread,  errors can occur which can change the documentation meaning.    Final Clinical Impression(s) / ED Diagnoses Final diagnoses:  Urinary retention    Rx / DC Orders ED Discharge Orders    None       Lennice Sites, DO 01/13/20 1726

## 2020-01-13 NOTE — ED Notes (Signed)
Patient denies pain and is resting comfortably.  

## 2020-01-13 NOTE — ED Triage Notes (Signed)
BIB EMS from home after pain/near syncopal event. He had surgery this morning at 8 am at [facility] for left sided inguinal hernia repair and drove home to [facility] where he was standing to void and had episode. Unsure if he voided after surgery this morning in [place].

## 2020-01-27 ENCOUNTER — Encounter: Payer: Self-pay | Admitting: Physician Assistant

## 2020-01-27 DIAGNOSIS — R29818 Other symptoms and signs involving the nervous system: Secondary | ICD-10-CM

## 2020-02-08 ENCOUNTER — Other Ambulatory Visit: Payer: Self-pay | Admitting: Physician Assistant

## 2020-02-08 MED FILL — ESZOPICLONE 2 MG TAB: 2 | 30 days supply | Qty: 30 | Fill #1

## 2020-02-10 ENCOUNTER — Other Ambulatory Visit: Payer: Self-pay | Admitting: Physician Assistant

## 2020-02-10 MED FILL — diazePAM 2 MG TABS: 2 | 30 days supply | Qty: 60 | Fill #0

## 2020-02-10 NOTE — Telephone Encounter (Signed)
Last refill: 01/04/20 #60, 0 Last OV: 10/29/19 dx. Anxiety

## 2020-02-14 ENCOUNTER — Other Ambulatory Visit (HOSPITAL_COMMUNITY): Payer: Self-pay | Admitting: Orthopedic Surgery

## 2020-02-14 MED FILL — METHOCARBAMOL 750 MG TABS: 750 | 30 days supply | Qty: 90 | Fill #0

## 2020-02-16 ENCOUNTER — Other Ambulatory Visit: Payer: Self-pay | Admitting: Physician Assistant

## 2020-02-18 ENCOUNTER — Other Ambulatory Visit: Payer: Self-pay | Admitting: Family Medicine

## 2020-02-18 MED FILL — FENOFIBRATE 145 MG TABS: 145 | 90 days supply | Qty: 90 | Fill #0

## 2020-02-22 ENCOUNTER — Other Ambulatory Visit: Payer: Self-pay | Admitting: Family

## 2020-02-22 ENCOUNTER — Encounter: Payer: Self-pay | Admitting: Physician Assistant

## 2020-02-22 DIAGNOSIS — L989 Disorder of the skin and subcutaneous tissue, unspecified: Secondary | ICD-10-CM

## 2020-02-24 ENCOUNTER — Other Ambulatory Visit: Payer: Self-pay | Admitting: Physician Assistant

## 2020-02-25 ENCOUNTER — Other Ambulatory Visit: Payer: Self-pay | Admitting: Family Medicine

## 2020-02-25 MED FILL — LEVOCETIRIZINE 5 MG TABLET: 5 | 90 days supply | Qty: 90 | Fill #0

## 2020-02-27 NOTE — Progress Notes (Signed)
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 Family have commented on loud snoring. He is aware of restless, non-restorative sleep, waking with dry mouth.  Lunesta used some for insomnia, works best of meds tried. 1 cup coffee, 2 diet [employer].. Naps get long.  Some hx SVT, no ENT surgery. Mild asthma.  Prior to Admission medications   Medication Sig Start Date End Date Taking? Authorizing Provider  albuterol (VENTOLIN HFA) 108 (90 Base) MCG/ACT inhaler INHALE 2 PUFFS BY MOUTH EVERY 6 HOURS AS NEEDED FOR WHEEZING 01/05/19  Yes Brunetta Jeans, PA-C  atorvastatin (LIPITOR) 20 MG tablet TAKE 1 TABLET (20 MG TOTAL) BY MOUTH DAILY. 01/10/20  Yes Brunetta Jeans, PA-C  diazepam (VALIUM) 2 MG tablet TAKE 1 TABLET BY MOUTH EVERY 12 HOURS AS NEEDED FOR ANXIETY. 02/10/20  Yes Brunetta Jeans, PA-C  eszopiclone (LUNESTA) 2 MG TABS tablet TAKE 1 TABLET (2 MG TOTAL) BY MOUTH AT BEDTIME AS NEEDED FOR SLEEP. TAKE IMMEDIATELY BEFORE BEDTIME 01/04/20  Yes Brunetta Jeans, PA-C  fenofibrate (TRICOR) 145 MG tablet TAKE 1 TABLET (145 MG TOTAL) BY MOUTH DAILY. 02/18/20  Yes Midge Minium, MD  finasteride (PROPECIA) 1 MG tablet TAKE 1 TABLET (1 MG TOTAL) BY MOUTH DAILY. 12/20/19  Yes Brunetta Jeans, PA-C  fluticasone (FLONASE) 50 MCG/ACT nasal spray Place 2 sprays into both nostrils daily. 06/18/18  Yes Brunetta Jeans, PA-C  HYDROcodone-acetaminophen (NORCO/VICODIN) 5-325 MG tablet Take 1 tablet by mouth 3 (three) times daily as needed. 10/08/19  Yes [provider]  levocetirizine  (XYZAL) 5 MG tablet TAKE 1 TABLET BY MOUTH EVERY EVENING. 02/25/20  Yes Midge Minium, MD  methocarbamol (ROBAXIN) 750 MG tablet Take 750 mg by mouth 4 (four) times daily.   Yes [provider]  metoprolol succinate (TOPROL-XL) 50 MG 24 hr tablet TAKE 1 TABLET (50 MG TOTAL) BY MOUTH DAILY. 12/20/19  Yes Brunetta Jeans, PA-C  montelukast (SINGULAIR) 10 MG tablet TAKE 1 TABLET (10 MG TOTAL) BY MOUTH AT BEDTIME. 11/16/19  Yes Midge Minium, MD  Multiple Vitamin (MULTIVITAMIN WITH MINERALS) TABS tablet Take 1 tablet by mouth daily.   Yes [provider]  omeprazole (PRILOSEC) 20 MG capsule TAKE 1 CAPSULE (20 MG TOTAL) BY MOUTH DAILY. 01/10/20  Yes Brunetta Jeans, PA-C  PARoxetine (PAXIL) 30 MG tablet TAKE 1 TABLET (30 MG TOTAL) BY MOUTH DAILY. 01/10/20  Yes Brunetta Jeans, PA-C  pregabalin (LYRICA) 75 MG capsule Take 75 mg by mouth 2 (two) times daily. 10/15/19  Yes [provider]  promethazine (PHENERGAN) 25 MG tablet TAKE 1 TABLET BY MOUTH EVERY 8 HOURS AS NEEDED FOR NAUSEA OR VOMITING 01/04/20  Yes Brunetta Jeans, PA-C  triamterene-hydrochlorothiazide (DYAZIDE) 37.5-25 MG capsule TAKE 1 CAPSULE BY MOUTH DAILY. 08/04/19  Yes Brunetta Jeans, PA-C  VASCEPA 1 g capsule TAKE 2 CAPSULES BY MOUTH TWICE A DAY 11/11/19  Yes Delorse Limber   Past Medical History:  Diagnosis Date  . Asthma   . Chicken pox   . Depression    Resolved  . Environmental and seasonal allergies   . Essential hypertension, benign  08/03/2013  . Hyperlipidemia   . Meniere's disease   . Thoracic outlet syndrome    Past Surgical History:  Procedure Laterality Date  . LUMBAR FUSION  04/22/2019   [facility] in [place]- 2 LEVEL FUSION  . Nevus Biopsy     Family History  Problem Relation Age of Onset  . Hyperlipidemia Father        Living  . Stroke Father   . Hypertension Father   . Alcohol abuse Mother        Living  . Diabetes Mellitus II Maternal Grandfather    . Hypertension Maternal Grandfather   . Heart failure Maternal Grandfather   . Kidney disease Maternal Grandfather   . Heart disease Maternal Grandmother   . Melanoma Paternal Grandmother   . Heart attack Paternal Grandfather   . Migraines Brother   . Healthy Brother        x2  . Drug abuse Sister        Died of Overdose   Social History   Socioeconomic History  . Marital status: Single    Spouse name: Not on file  . Number of children: Not on file  . Years of education: Not on file  . Highest education level: Not on file  Occupational History  . Not on file  Tobacco Use  . Smoking status: Never Smoker  . Smokeless tobacco: Never Used  Vaping Use  . Vaping Use: Never used  Substance and Sexual Activity  . Alcohol use: Yes    Alcohol/week: 0.0 standard drinks    Comment: 2-3 beers nightly  . Drug use: No  . Sexual activity: Yes    Birth control/protection: None    Comment: male - 1 partner  Other Topics Concern  . Not on file  Social History Narrative  . Not on file   Social Determinants of Health   Financial Resource Strain: Not on file  Food Insecurity: Not on file  Transportation Needs: Not on file  Physical Activity: Not on file  Stress: Not on file  Social Connections: Not on file  Intimate Partner Violence: Not on file   ROS-see HPI   + = positive Constitutional:    weight loss, night sweats, fevers, chills, fatigue, lassitude. HEENT:    headaches, difficulty swallowing,+ tooth/dental problems, sore throat,       sneezing, itching, ear ache, nasal congestion, post nasal drip, snoring CV:    chest pain, orthopnea, PND, swelling in lower extremities, anasarca,                                  dizziness, +palpitations Resp:   shortness of breath with exertion or at rest.                productive cough,   non-productive cough, coughing up of blood.              change in color of mucus.  wheezing.   Skin:    rash or lesions. GI:  No-   heartburn,  +indigestion, abdominal pain, nausea, vomiting, diarrhea,                 change in bowel habits, loss of appetite GU: dysuria, change in color of urine, no urgency or frequency.   flank pain. MS:   joint pain, stiffness, decreased range of motion, +back pain. Neuro-     nothing unusual Psych:  change  in mood or affect. + depression or +anxiety.   memory loss.  OBJ- Physical Exam General- Alert, Oriented, Affect-appropriate, Distress- none acute, + overweight Skin- rash-none, lesions- none, excoriation- none Lymphadenopathy- none Head- atraumatic            Eyes- Gross vision intact, PERRLA, conjunctivae and secretions clear            Ears- Hearing, canals-normal            Nose- Clear, no-Septal dev, mucus, polyps, erosion, perforation             Throat- Mallampati III , mucosa clear , drainage- none, tonsils+present, + teeth Neck- flexible , trachea midline, no stridor , thyroid nl, carotid no bruit Chest - symmetrical excursion , unlabored           Heart/CV- RRR , no murmur , no gallop  , no rub, nl s1 s2                           - JVD- none , edema- none, stasis changes- none, varices- none           Lung- clear to P&A, wheeze- none, cough- none , dullness-none, rub- none           Chest wall-  Abd-  Br/ Gen/ Rectal- Not done, not indicated Extrem- cyanosis- none, clubbing, none, atrophy- none, strength- nl Neuro- grossly intact to observation

## 2020-02-28 ENCOUNTER — Other Ambulatory Visit (HOSPITAL_COMMUNITY): Payer: Self-pay

## 2020-02-28 ENCOUNTER — Encounter: Payer: Self-pay | Admitting: Internal Medicine

## 2020-02-28 ENCOUNTER — Other Ambulatory Visit: Payer: Self-pay

## 2020-02-28 ENCOUNTER — Ambulatory Visit (INDEPENDENT_AMBULATORY_CARE_PROVIDER_SITE_OTHER): Payer: No Typology Code available for payment source | Admitting: Internal Medicine

## 2020-02-28 DIAGNOSIS — R0683 Snoring: Secondary | ICD-10-CM | POA: Diagnosis not present

## 2020-02-28 DIAGNOSIS — F5101 Primary insomnia: Secondary | ICD-10-CM | POA: Diagnosis not present

## 2020-02-28 MED FILL — PENICILLIN VK 500 MG TABLET: 500 | 7 days supply | Qty: 28 | Fill #0

## 2020-02-28 NOTE — Assessment & Plan Note (Signed)
Some difficulty initiating and maintaining sleep There is likely overlap with sleep disordered breathing. Plan- continue current meds. Preliminary review of sleep hygiene

## 2020-02-28 NOTE — Assessment & Plan Note (Signed)
OSA is likely. Appropriate discussion of symptoms, complications, treatments, safe driving responsibility. Questions answered Plan- sleep study then perhaps CPAP

## 2020-02-28 NOTE — Patient Instructions (Signed)
Order- schedule unattended home sleep test   Dx OSA  Please call us about 2 weeks after your sleep test for results and recommendations.

## 2020-03-13 ENCOUNTER — Other Ambulatory Visit: Payer: Self-pay | Admitting: Physician Assistant

## 2020-03-13 DIAGNOSIS — I10 Essential (primary) hypertension: Secondary | ICD-10-CM

## 2020-03-13 DIAGNOSIS — F5101 Primary insomnia: Secondary | ICD-10-CM

## 2020-03-13 MED FILL — diazePAM 2 MG TABS: 2 | 30 days supply | Qty: 60 | Fill #1

## 2020-03-14 ENCOUNTER — Telehealth: Payer: No Typology Code available for payment source | Admitting: Physician Assistant

## 2020-03-14 ENCOUNTER — Other Ambulatory Visit: Payer: Self-pay | Admitting: Family

## 2020-03-14 DIAGNOSIS — T7840XA Allergy, unspecified, initial encounter: Secondary | ICD-10-CM | POA: Diagnosis not present

## 2020-03-14 MED ORDER — NAPHAZOLINE-PHENIRAMINE 0.025-0.3 % OP SOLN: 1.0000 [drp] | Freq: Four times a day (QID) | OPHTHALMIC | 0 refills | Status: AC | PRN

## 2020-03-14 MED FILL — ESZOPICLONE 2 MG TAB: 2 | 30 days supply | Qty: 30 | Fill #0

## 2020-03-14 MED FILL — TRIAMTERENE/HCTZ 37.5/25 CP: 37.5-25 | 90 days supply | Qty: 90 | Fill #0

## 2020-03-14 NOTE — Telephone Encounter (Signed)
Lunesta last rx 01/04/20 #30 1 RF LOV: 10/29/19 Anxiety

## 2020-03-14 NOTE — Progress Notes (Signed)
E visit for Allergic Rhinitis We are sorry that you are not feeling well.  Here is how we plan to help!  Based on what you have shared with me it looks like you have Allergic Rhinitis.  Rhinitis is when a reaction occurs that causes nasal congestion, runny nose, sneezing, and itching.  Most types of rhinitis are caused by an inflammation and are associated with symptoms in the eyes ears or throat. There are several types of rhinitis.  The most common are acute rhinitis, which is usually caused by a viral illness, allergic or seasonal rhinitis, and nonallergic or year-round rhinitis.  Nasal allergies occur certain times of the year.  Allergic rhinitis is caused when allergens in the air trigger the release of histamine in the body.  Histamine causes itching, swelling, and fluid to build up in the fragile linings of the nasal passages, sinuses and eyelids.  An itchy nose and clear discharge are common.  You may benefit from eye drops such as: Naphcon, place 1 drop in the eye up to 4 times daily.   HOME CARE:   You can use an over-the-counter saline nasal spray as needed  Avoid areas where there is heavy dust, mites, or molds  Stay indoors on windy days during the pollen season  Keep windows closed in home, at least in bedroom; use air conditioner.  Use high-efficiency house air filter  Keep windows closed in car, turn AC on re-circulate  Avoid playing out with dog during pollen season  GET HELP RIGHT AWAY IF:   If your symptoms do not improve within 10 days  You become short of breath  You develop yellow or green discharge from your nose for over 3 days  You have coughing fits  MAKE SURE YOU:   Understand these instructions  Will watch your condition  Will get help right away if you are not doing well or get worse  Thank you for choosing an e-visit. Your e-visit answers were reviewed by a board certified advanced clinical practitioner to complete your personal care plan.  Depending upon the condition, your plan could have included both over the counter or prescription medications. Please review your pharmacy choice. Be sure that the pharmacy you have chosen is open so that you can pick up your prescription now.  If there is a problem you may message your provider in [place] to have the prescription routed to another pharmacy. Your safety is important to [place]. If you have drug allergies check your prescription carefully.  For the next 24 hours, you can use MyChart to ask questions about today's visit, request a non-urgent call back, or ask for a work or school excuse from your e-visit provider. You will get an email in the next two days asking about your experience. I hope that your e-visit has been valuable and will speed your recovery.   Approximately 5 minutes was spent documenting and reviewing patient's chart.

## 2020-03-15 MED FILL — MONTELUKAST SOD 10 MG TAB: 10 | 90 days supply | Qty: 90 | Fill #1

## 2020-03-16 ENCOUNTER — Encounter: Payer: Self-pay | Admitting: Physician Assistant

## 2020-03-17 ENCOUNTER — Telehealth: Payer: No Typology Code available for payment source | Admitting: Nurse Practitioner

## 2020-03-17 ENCOUNTER — Other Ambulatory Visit (HOSPITAL_COMMUNITY): Payer: Self-pay | Admitting: Physician Assistant

## 2020-03-17 DIAGNOSIS — H9209 Otalgia, unspecified ear: Secondary | ICD-10-CM

## 2020-03-17 DIAGNOSIS — J0100 Acute maxillary sinusitis, unspecified: Secondary | ICD-10-CM

## 2020-03-17 MED ORDER — DOXYCYCLINE HYCLATE 100 MG PO TABS: 100.0000 mg | ORAL_TABLET | Freq: Two times a day (BID) | ORAL | 0 refills | Status: AC

## 2020-03-17 MED ORDER — PREDNISONE 20 MG PO TABS: 40.0000 mg | ORAL_TABLET | Freq: Every day | ORAL | 0 refills | Status: AC

## 2020-03-17 MED FILL — PREGABALIN 50 MG CAPS: 50 | 15 days supply | Qty: 30 | Fill #0

## 2020-03-17 MED FILL — METHOCARBAMOL 750 MG TABS: 750 | 30 days supply | Qty: 90 | Fill #0

## 2020-03-17 NOTE — Progress Notes (Signed)
We are sorry that you are not feeling well.  Here is how we plan to help!  Based on what you have shared with me it looks like you have sinusitis.  Sinusitis is inflammation and infection in the sinus cavities of the head.  Based on your presentation I believe you most likely have Acute Bacterial Sinusitis.  This is an infection caused by bacteria and is treated with antibiotics. I have prescribed doxycycline 100mg  one tablet twice daily with food, for 10days. and prednisone 20mg  2po daily for 5 days. You may use an oral decongestant such as Mucinex D or if you have glaucoma or high blood pressure use plain Mucinex. Saline nasal spray help and can safely be used as often as needed for congestion.  If you develop worsening sinus pain, fever or notice severe headache and vision changes, or if symptoms are not better after completion of antibiotic, please schedule an appointment with a health care provider.    Sinus infections are not as easily transmitted as other respiratory infection, however we still recommend that you avoid close contact with loved ones, especially the very young and elderly.  Remember to wash your hands thoroughly throughout the day as this is the number one way to prevent the spread of infection!  Home Care:  Only take medications as instructed by your medical team.  Complete the entire course of an antibiotic.  Do not take these medications with alcohol.  A steam or ultrasonic humidifier can help congestion.  You can place a towel over your head and breathe in the steam from hot water coming from a faucet.  Avoid close contacts especially the very young and the elderly.  Cover your mouth when you cough or sneeze.  Always remember to wash your hands.  Get Help Right Away If:  You develop worsening fever or sinus pain.  You develop a severe head ache or visual changes.  Your symptoms persist after you have completed your treatment plan.  Make sure you  Understand  these instructions.  Will watch your condition.  Will get help right away if you are not doing well or get worse.  Your e-visit answers were reviewed by a board certified advanced clinical practitioner to complete your personal care plan.  Depending on the condition, your plan could have included both over the counter or prescription medications.  If there is a problem please reply  once you have received a response from your provider.  Your safety is important to [place].  If you have drug allergies check your prescription carefully.    You can use MyChart to ask questions about today's visit, request a non-urgent call back, or ask for a work or school excuse for 24 hours related to this e-Visit. If it has been greater than 24 hours you will need to follow up with your provider, or enter a new e-Visit to address those concerns.  You will get an e-mail in the next two days asking about your experience.  I hope that your e-visit has been valuable and will speed your recovery. Thank you for using e-visits.  5-10 minutes spent reviewing and documenting in chart.

## 2020-03-27 ENCOUNTER — Other Ambulatory Visit (HOSPITAL_COMMUNITY): Payer: Self-pay | Admitting: Orthopedic Surgery

## 2020-04-01 ENCOUNTER — Encounter (HOSPITAL_BASED_OUTPATIENT_CLINIC_OR_DEPARTMENT_OTHER): Payer: Self-pay

## 2020-04-01 ENCOUNTER — Other Ambulatory Visit: Payer: Self-pay

## 2020-04-01 ENCOUNTER — Emergency Department (HOSPITAL_BASED_OUTPATIENT_CLINIC_OR_DEPARTMENT_OTHER): Payer: PRIVATE HEALTH INSURANCE

## 2020-04-01 ENCOUNTER — Emergency Department (HOSPITAL_BASED_OUTPATIENT_CLINIC_OR_DEPARTMENT_OTHER)
Admission: EM | Admit: 2020-04-01 | Discharge: 2020-04-01 | Disposition: A | Discharge status: Home / Self Care | Payer: PRIVATE HEALTH INSURANCE | Attending: Emergency Medicine | Admitting: Emergency Medicine

## 2020-04-01 DIAGNOSIS — J45909 Unspecified asthma, uncomplicated: Secondary | ICD-10-CM | POA: Insufficient documentation

## 2020-04-01 DIAGNOSIS — Z79899 Other long term (current) drug therapy: Secondary | ICD-10-CM | POA: Insufficient documentation

## 2020-04-01 DIAGNOSIS — R110 Nausea: Secondary | ICD-10-CM | POA: Diagnosis not present

## 2020-04-01 DIAGNOSIS — K4091 Unilateral inguinal hernia, without obstruction or gangrene, recurrent: Secondary | ICD-10-CM | POA: Diagnosis not present

## 2020-04-01 DIAGNOSIS — I10 Essential (primary) hypertension: Secondary | ICD-10-CM | POA: Diagnosis not present

## 2020-04-01 DIAGNOSIS — R61 Generalized hyperhidrosis: Secondary | ICD-10-CM | POA: Insufficient documentation

## 2020-04-01 DIAGNOSIS — R1032 Left lower quadrant pain: Secondary | ICD-10-CM | POA: Diagnosis present

## 2020-04-01 DIAGNOSIS — R000 Tachycardia, unspecified: Secondary | ICD-10-CM | POA: Insufficient documentation

## 2020-04-01 LAB — CBC WITH DIFFERENTIAL/PLATELET
Abs Immature Granulocytes: 0.02 10*3/uL (ref 0.00–0.07)
Basophils Absolute: 0.1 10*3/uL (ref 0.0–0.1)
Basophils Relative: 2 %
Eosinophils Absolute: 0.1 10*3/uL (ref 0.0–0.5)
Eosinophils Relative: 2 %
HCT: 41.6 % (ref 39.0–52.0)
Hemoglobin: 14.1 g/dL (ref 13.0–17.0)
Immature Granulocytes: 0 %
Lymphocytes Relative: 27 %
Lymphs Abs: 1.4 10*3/uL (ref 0.7–4.0)
MCH: 28.2 pg (ref 26.0–34.0)
MCHC: 33.9 g/dL (ref 30.0–36.0)
MCV: 83.2 fL (ref 80.0–100.0)
Monocytes Absolute: 0.5 10*3/uL (ref 0.1–1.0)
Monocytes Relative: 9 %
Neutro Abs: 3.1 10*3/uL (ref 1.7–7.7)
Neutrophils Relative %: 60 %
Platelets: 212 10*3/uL (ref 150–400)
RBC: 5 MIL/uL (ref 4.22–5.81)
RDW: 13.9 % (ref 11.5–15.5)
WBC: 5.2 10*3/uL (ref 4.0–10.5)
nRBC: 0 % (ref 0.0–0.2)

## 2020-04-01 LAB — COMPREHENSIVE METABOLIC PANEL
ALT: 22 U/L (ref 0–44)
AST: 16 U/L (ref 15–41)
Albumin: 4.6 g/dL (ref 3.5–5.0)
Alkaline Phosphatase: 48 U/L (ref 38–126)
Anion gap: 11 (ref 5–15)
BUN: 14 mg/dL (ref 6–20)
CO2: 25 mmol/L (ref 22–32)
Calcium: 9.4 mg/dL (ref 8.9–10.3)
Chloride: 105 mmol/L (ref 98–111)
Creatinine, Ser: 0.91 mg/dL (ref 0.61–1.24)
GFR, Estimated: >60 mL/min (ref >60)
Glucose, Bld: 111 mg/dL — ABNORMAL HIGH (ref 70–99)
Potassium: 3.4 mmol/L — ABNORMAL LOW (ref 3.5–5.1)
Sodium: 141 mmol/L (ref 135–145)
Total Bilirubin: 0.4 mg/dL (ref 0.3–1.2)
Total Protein: 6.9 g/dL (ref 6.5–8.1)

## 2020-04-01 LAB — URINALYSIS, ROUTINE W REFLEX MICROSCOPIC
Bilirubin Urine: NEGATIVE
Glucose, UA: NEGATIVE mg/dL
Hgb urine dipstick: NEGATIVE
Ketones, ur: NEGATIVE mg/dL
Leukocytes,Ua: NEGATIVE
Nitrite: NEGATIVE
Specific Gravity, Urine: 1.045 — ABNORMAL HIGH (ref 1.005–1.030)
pH: 6 (ref 5.0–8.0)

## 2020-04-01 LAB — LIPASE, BLOOD: Lipase: 29 U/L (ref 11–51)

## 2020-04-01 MED ORDER — SODIUM CHLORIDE 0.9 % IV BOLUS
1000.0000 mL | Freq: Once | INTRAVENOUS | Status: AC
  Administered 2020-04-01: 1000 mL via INTRAVENOUS

## 2020-04-01 MED ORDER — PREGABALIN 50 MG PO CAPS: 50.0000 mg | ORAL_CAPSULE | Freq: Three times a day (TID) | ORAL | 0 refills | Status: AC

## 2020-04-01 MED ORDER — IOHEXOL 300 MG/ML  SOLN
100.0000 mL | Freq: Once | INTRAMUSCULAR | Status: AC | PRN
  Administered 2020-04-01: 100 mL via INTRAVENOUS

## 2020-04-01 NOTE — ED Provider Notes (Signed)
[facility] EMERGENCY DEPT Provider Note   CSN: [id number] Arrival date & time: 04/01/20  1620     History Chief Complaint  Patient presents with  . Hernia    Steve Jacobs is a 35 y.o. male.  The history is provided by the patient and medical records.   Steve Jacobs is a 35 y.o. male who presents to the Emergency Department complaining of abdominal pain. He presents the emergency department complaining of left-sided abdominal pain. He has a history of lumbar surgery that resulted in inguinal hernia that required repair. It was repaired in December 2021. He has been doing well until two weeks ago when he had to do testing to return to work following his workers comp case. He states that he was fatigued and body soreness after the testing. Yesterday he developed increased pain in left lower quadrant and started to notice bulging and swelling in the left inguinal region that felt like his prior inguinal hernia. Today he developed sharp and stabbing left lower quadrant abdominal pain with associated diaphoresis and nausea. He continues to feel the bulging area that is more obvious with laying down and standing. No fevers, vomiting, diarrhea, constipation, dysuria.  Surgery was performed at [facility].    Past Medical History:  Diagnosis Date  . Asthma   . Chicken pox   . Depression    Resolved  . Environmental and seasonal allergies   . Essential hypertension, benign 08/03/2013  . Hyperlipidemia   . Meniere's disease   . Thoracic outlet syndrome     Patient Active Problem List   Diagnosis Date Noted  . Snoring 02/28/2020  . Radiculopathy, lumbosacral region 08/23/2019  . Gastroesophageal reflux disease without esophagitis 06/19/2017  . Need for hepatitis A vaccination 06/19/2017  . 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 Routine screening for STI (sexually transmitted infection) 06/07/2016  . SVT (supraventricular tachycardia) ([place]) 10/08/2014  .  Depression 10/08/2014  . Atypical nevi 06/22/2014  . Elevated triglycerides with high cholesterol 09/06/2013  . Anxiety state 08/03/2013  . Essential hypertension, benign 08/03/2013  . Environmental allergies 03/23/2013  . Visit for preventive health examination 03/23/2013  . Meniere disease 03/23/2013  . Intrinsic asthma     Past Surgical History:  Procedure Laterality Date  . LUMBAR FUSION  04/22/2019   [facility] [place]- 2 LEVEL FUSION  . Nevus Biopsy         Family History  Problem Relation Age of Onset  . Hyperlipidemia Father        Living  . Stroke Father   . Hypertension Father   . Alcohol abuse Mother        Living  . Diabetes Mellitus II Maternal Grandfather   . Hypertension Maternal Grandfather   . Heart failure Maternal Grandfather   . Kidney disease Maternal Grandfather   . Heart disease Maternal Grandmother   . Melanoma Paternal Grandmother   . Heart attack Paternal Grandfather   . Migraines Brother   . Healthy Brother        x2  . Drug abuse Sister        Died of Overdose    Social History   Tobacco Use  . Smoking status: Never Smoker  . Smokeless tobacco: Never Used  Vaping Use  . Vaping Use: Never used  Substance Use Topics  . Alcohol use: Yes    Alcohol/week: 0.0 standard drinks    Comment: 2-3 beers nightly  . Drug use: No  Home Medications Prior to Admission medications   Medication Sig Start Date End Date Taking? Authorizing Provider  pregabalin (LYRICA) 50 MG capsule Take 1 capsule (50 mg total) by mouth 3 (three) times daily. 04/01/20  Yes Quintella Reichert, MD  albuterol (VENTOLIN HFA) 108 (90 Base) MCG/ACT inhaler INHALE 2 PUFFS BY MOUTH EVERY 6 HOURS AS NEEDED FOR WHEEZING 01/05/19   Brunetta Jeans, PA-C  atorvastatin (LIPITOR) 20 MG tablet TAKE 1 TABLET (20 MG TOTAL) BY MOUTH DAILY. 01/10/20   Brunetta Jeans, PA-C  diazepam (VALIUM) 2 MG tablet TAKE 1 TABLET BY MOUTH EVERY 12 HOURS AS NEEDED FOR ANXIETY. 02/10/20    Brunetta Jeans, PA-C  doxycycline (VIBRA-TABS) 100 MG tablet Take 1 tablet (100 mg total) by mouth 2 (two) times daily. 1 po bid 03/17/20   Hassell Done, Mary-Margaret, FNP  eszopiclone (LUNESTA) 2 MG TABS tablet TAKE 1 TABLET (2 MG TOTAL) BY MOUTH AT BEDTIME AS NEEDED FOR SLEEP. TAKE IMMEDIATELY BEFORE BEDTIME 03/14/20   Dutch Quint B, FNP  fenofibrate (TRICOR) 145 MG tablet TAKE 1 TABLET (145 MG TOTAL) BY MOUTH DAILY. 02/18/20   Midge Minium, MD  finasteride (PROPECIA) 1 MG tablet TAKE 1 TABLET (1 MG TOTAL) BY MOUTH DAILY. 12/20/19   Brunetta Jeans, PA-C  fluticasone (FLONASE) 50 MCG/ACT nasal spray Place 2 sprays into both nostrils daily. 06/18/18   Brunetta Jeans, PA-C  HYDROcodone-acetaminophen (NORCO/VICODIN) 5-325 MG tablet Take 1 tablet by mouth 3 (three) times daily as needed. 10/08/19   [provider]  levocetirizine (XYZAL) 5 MG tablet TAKE 1 TABLET BY MOUTH EVERY EVENING. 02/25/20   Midge Minium, MD  methocarbamol (ROBAXIN) 750 MG tablet Take 750 mg by mouth 4 (four) times daily.    [provider]  metoprolol succinate (TOPROL-XL) 50 MG 24 hr tablet TAKE 1 TABLET (50 MG TOTAL) BY MOUTH DAILY. 12/20/19   Brunetta Jeans, PA-C  montelukast (SINGULAIR) 10 MG tablet TAKE 1 TABLET (10 MG TOTAL) BY MOUTH AT BEDTIME. 11/16/19   Midge Minium, MD  Multiple Vitamin (MULTIVITAMIN WITH MINERALS) TABS tablet Take 1 tablet by mouth daily.    [provider]  naphazoline-pheniramine (NAPHCON-A) 0.025-0.3 % ophthalmic solution Place 1 drop into both eyes 4 (four) times daily as needed for eye irritation. 03/14/20   Couture, Cortni S, PA-C  omeprazole (PRILOSEC) 20 MG capsule TAKE 1 CAPSULE (20 MG TOTAL) BY MOUTH DAILY. 01/10/20   Brunetta Jeans, PA-C  PARoxetine (PAXIL) 30 MG tablet TAKE 1 TABLET (30 MG TOTAL) BY MOUTH DAILY. 01/10/20   Brunetta Jeans, PA-C  pregabalin (LYRICA) 75 MG capsule Take 75 mg by mouth 2 (two) times daily. 10/15/19   [provider]  promethazine (PHENERGAN) 25 MG tablet TAKE 1 TABLET BY MOUTH EVERY 8 HOURS AS NEEDED FOR NAUSEA OR VOMITING 01/04/20   Brunetta Jeans, PA-C  triamterene-hydrochlorothiazide (DYAZIDE) 37.5-25 MG capsule Take 1 each (1 capsule total) by mouth daily. Further refills will need to come from new primary care provider 03/14/20   Dutch Quint B, FNP  VASCEPA 1 g capsule TAKE 2 CAPSULES BY MOUTH TWICE A DAY 11/11/19   Brunetta Jeans, PA-C    Allergies    Augmentin [amoxicillin-pot clavulanate], Belsomra [suvorexant], and Percocet [oxycodone-acetaminophen]  Review of Systems   Review of Systems  All other systems reviewed and are negative.   Physical Exam Updated Vital Signs BP (!) 138/95 (BP Location: Right Arm)   Pulse 87  Temp 98.1 F (36.7 C) (Oral)   Resp 18   SpO2 97%   Physical Exam Vitals and nursing note reviewed.  Constitutional:      Appearance: He is well-developed.  HENT:     Head: Normocephalic and atraumatic.  Cardiovascular:     Rate and Rhythm: Regular rhythm. Tachycardia present.     Heart sounds: No murmur heard.   Pulmonary:     Effort: Pulmonary effort is normal. No respiratory distress.     Breath sounds: Normal breath sounds.  Abdominal:     Palpations: Abdomen is soft.     Tenderness: There is no guarding or rebound.     Comments: Moderate left upper quadrant and left lower quadrant tenderness. No appreciable incarcerated hernia on evaluation.  Musculoskeletal:        General: No tenderness.  Skin:    General: Skin is warm and dry.  Neurological:     Mental Status: He is alert and oriented to person, place, and time.  Psychiatric:        Behavior: Behavior normal.     ED Results / Procedures / Treatments   Labs (all labs ordered are listed, but only abnormal results are displayed) Labs Reviewed  COMPREHENSIVE METABOLIC PANEL - Abnormal; Notable for the following components:      Result Value   Potassium 3.4 (*)    Glucose,  Bld 111 (*)    All other components within normal limits  URINALYSIS, ROUTINE W REFLEX MICROSCOPIC - Abnormal; Notable for the following components:   Specific Gravity, Urine 1.045 (*)    Protein, ur TRACE (*)    All other components within normal limits  CBC WITH DIFFERENTIAL/PLATELET  LIPASE, BLOOD    EKG None  Radiology CT Abdomen Pelvis W Contrast  Result Date: 04/01/2020 CLINICAL DATA:  Abdominal pain, concern for hernia. Patient has a history of left inguinal hernia repair. EXAM: CT ABDOMEN AND PELVIS WITH CONTRAST TECHNIQUE: Multidetector CT imaging of the abdomen and pelvis was performed using the standard protocol following bolus administration of intravenous contrast. CONTRAST:  166mL OMNIPAQUE IOHEXOL 300 MG/ML  SOLN COMPARISON:  CT lumbar spine dated 08/27/2018. FINDINGS: Lower chest: No acute abnormality. Hepatobiliary: No focal liver abnormality is seen. No gallstones, gallbladder wall thickening, or biliary dilatation. Pancreas: Unremarkable. No pancreatic ductal dilatation or surrounding inflammatory changes. Spleen: Normal in size without focal abnormality. Adrenals/Urinary Tract: Adrenal glands are unremarkable. Kidneys are normal, without renal calculi, focal lesion, or hydronephrosis. Bladder is unremarkable. Stomach/Bowel: Stomach is within normal limits. Appendix appears normal. No evidence of bowel wall thickening, distention, or inflammatory changes. Vascular/Lymphatic: No significant vascular findings are present. No enlarged abdominal or pelvic lymph nodes. Reproductive: Prostate is unremarkable. Other: A [place] fat containing hernia is seen in the lower lateral anterior abdominal wall, just superolateral to the left deep inguinal ring. Overlying fat stranding extending to the skin, likely reflecting a surgical incision. No well-defined fluid collection is identified to suggest abscess. No bowel is seen within the hernia sac. No abdominopelvic ascites. Musculoskeletal: Lumbar  fixation hardware is seen at L4-S1. IMPRESSION: [place] fat containing hernia in the lower lateral anterior abdominal wall, superolateral to the left deep inguinal ring. Overlying fat stranding extends to the skin which may be postoperative or reflect developing infection. No well-defined fluid collection is identified to suggest abscess. No bowel is seen within the hernia sac. Electronically Signed   By: Zerita Boers M.D.   On: 04/01/2020 17:58    Procedures Procedures  Medications Ordered in ED Medications  sodium chloride 0.9 % bolus 1,000 mL (0 mLs Intravenous Stopping Infusion hung by another clincian 04/01/20 2018)  iohexol (OMNIPAQUE) 300 MG/ML solution 100 mL (100 mLs Intravenous Contrast Given 04/01/20 1728)    ED Course  I have reviewed the triage vital signs and the nursing notes.  Pertinent labs & imaging results that were available during my care of the patient were reviewed by me and considered in my medical decision making (see chart for details).    MDM Rules/Calculators/A&P                         patient with history of open left inguinal hernia repair at [facility] on January 13, 2020 here for evaluation of recurrent left inguinal pain. There is no palpable incarcerated hernia on examination. He does have a [place] palpable hernia of superior to his prior surgery site that is easily reducible. No reports of fevers at home. CBC without leukocytosis. CT abdomen pelvis demonstrates recurrent hernia near his prior surgical site. There is soft tissue stranding in this area that may be reactive versus developing infection. He has no overlying skin changes concerning for infection. Discussed CT scan findings with on-call PA for his surgeon at [facility]. Plan to discharge home with close outpatient surgery follow-up with return precautions.  Final Clinical Impression(s) / ED Diagnoses Final diagnoses:  Unilateral recurrent inguinal hernia without obstruction or gangrene    Rx / DC  Orders ED Discharge Orders         Ordered    pregabalin (LYRICA) 50 MG capsule  3 times daily        04/01/20 Linnell Fulling, MD 04/01/20 2043

## 2020-04-01 NOTE — ED Triage Notes (Signed)
He c/o pain and protuberance at area (left inguinal) of a prior herniorrhaphy. The hernia was caused by a weakness at the area of an anterior approach for spinal surgery. He is in no distress.

## 2020-04-01 NOTE — ED Notes (Signed)
ED Provider at bedside. 

## 2020-04-03 MED FILL — PREGABALIN 50 MG CAPS: 50 | 30 days supply | Qty: 60 | Fill #0

## 2020-04-05 ENCOUNTER — Other Ambulatory Visit: Payer: Self-pay | Admitting: Physician Assistant

## 2020-04-09 ENCOUNTER — Encounter: Payer: Self-pay | Admitting: Physician Assistant

## 2020-04-09 DIAGNOSIS — I471 Supraventricular tachycardia: Secondary | ICD-10-CM

## 2020-04-09 DIAGNOSIS — F411 Generalized anxiety disorder: Secondary | ICD-10-CM

## 2020-04-10 ENCOUNTER — Other Ambulatory Visit: Payer: Self-pay | Admitting: Physician Assistant

## 2020-04-10 DIAGNOSIS — I471 Supraventricular tachycardia: Secondary | ICD-10-CM

## 2020-04-10 DIAGNOSIS — F411 Generalized anxiety disorder: Secondary | ICD-10-CM

## 2020-04-10 MED FILL — OMEPRAZOLE DR 20 MG CAPSULE: 20 | 90 days supply | Qty: 90 | Fill #1

## 2020-04-11 ENCOUNTER — Other Ambulatory Visit: Payer: Self-pay | Admitting: Family

## 2020-04-11 MED ORDER — ALBUTEROL SULFATE HFA 108 (90 BASE) MCG/ACT IN AERS: INHALATION_SPRAY | RESPIRATORY_TRACT | 1 refills | Status: DC

## 2020-04-11 MED ORDER — METOPROLOL SUCCINATE ER 50 MG PO TB24: 50.0000 mg | ORAL_TABLET | Freq: Every day | ORAL | 0 refills | Status: DC

## 2020-04-11 MED ORDER — DIAZEPAM 2 MG PO TABS: 2.0000 mg | ORAL_TABLET | Freq: Two times a day (BID) | ORAL | 1 refills | Status: DC | PRN

## 2020-04-11 MED ORDER — OMEPRAZOLE 20 MG PO CPDR: 20.0000 mg | DELAYED_RELEASE_CAPSULE | Freq: Every day | ORAL | 1 refills | Status: DC

## 2020-04-11 MED FILL — METOPROLOL SUCCINATE ER 50: 50 | 90 days supply | Qty: 90 | Fill #0

## 2020-04-11 MED FILL — ALBUTEROL SULFATE HFA 108 (: 108 (90 BAS | 75 days supply | Qty: 54 | Fill #0

## 2020-04-11 MED FILL — diazePAM 2 MG TABS: 2 | 30 days supply | Qty: 60 | Fill #0

## 2020-04-11 NOTE — Telephone Encounter (Signed)
Pt requesting curtesy refills until sees new provider.

## 2020-04-25 ENCOUNTER — Other Ambulatory Visit: Payer: Self-pay | Admitting: Physician Assistant

## 2020-04-25 MED FILL — Eszopiclone Tab 2 MG: ORAL | 30 days supply | Qty: 30 | Fill #0 | Status: AC

## 2020-04-26 ENCOUNTER — Other Ambulatory Visit (HOSPITAL_COMMUNITY): Payer: Self-pay

## 2020-05-02 ENCOUNTER — Other Ambulatory Visit (HOSPITAL_COMMUNITY): Payer: Self-pay

## 2020-05-07 ENCOUNTER — Other Ambulatory Visit: Payer: Self-pay | Admitting: Physician Assistant

## 2020-05-09 ENCOUNTER — Other Ambulatory Visit (HOSPITAL_COMMUNITY): Payer: Self-pay

## 2020-05-10 ENCOUNTER — Other Ambulatory Visit (HOSPITAL_COMMUNITY): Payer: Self-pay

## 2020-05-10 MED ORDER — PREGABALIN 50 MG PO CAPS
50.0000 mg | ORAL_CAPSULE | Freq: Two times a day (BID) | ORAL | 0 refills | Status: AC
  Filled 2020-05-10: qty 60, 30d supply, fill #0

## 2020-05-10 MED ORDER — METHOCARBAMOL 750 MG PO TABS
750.0000 mg | ORAL_TABLET | Freq: Three times a day (TID) | ORAL | 0 refills | Status: AC
  Filled 2020-05-10: qty 90, 30d supply, fill #0

## 2020-05-10 MED FILL — Diazepam Tab 2 MG: ORAL | 30 days supply | Qty: 60 | Fill #0 | Status: AC

## 2020-05-12 ENCOUNTER — Other Ambulatory Visit (HOSPITAL_COMMUNITY): Payer: Self-pay

## 2020-05-14 ENCOUNTER — Other Ambulatory Visit: Payer: Self-pay | Admitting: Physician Assistant

## 2020-05-15 ENCOUNTER — Other Ambulatory Visit (HOSPITAL_COMMUNITY): Payer: Self-pay

## 2020-05-15 ENCOUNTER — Telehealth: Payer: Self-pay

## 2020-05-15 MED ORDER — PAROXETINE HCL 30 MG PO TABS
30.0000 mg | ORAL_TABLET | Freq: Every day | ORAL | 0 refills | Status: AC
  Filled 2020-05-15: qty 30, 30d supply, fill #0

## 2020-05-15 NOTE — Telephone Encounter (Signed)
Spoke to pt told him Rx for Paxil was sent to pharmacy for 30 day supply until you see [place]. Pt verbalized understanding.

## 2020-05-15 NOTE — Telephone Encounter (Signed)
.   Patient is requesting 3 days worth just to get him to his  apt. He will get withdrawals and will be sick but does want to speak about lowering dosage at np apt.Please advise   LAST APPOINTMENT DATE: Visit date not found   NEXT APPOINTMENT DATE:@5 /04/2020  MEDICATION:PARoxetine (PAXIL) 30 MG tablet   PHARMACY:[facility]

## 2020-05-17 ENCOUNTER — Encounter: Payer: No Typology Code available for payment source | Admitting: Physician Assistant

## 2020-05-23 ENCOUNTER — Other Ambulatory Visit: Payer: Self-pay

## 2020-05-23 NOTE — Telephone Encounter (Signed)
MEDICATION:   finasteride (PROPECIA) 1 MG tablet  PHARMACY: [facility]  Comments: Pt was established with Elyn Aquas. Pt has a TOC with Dr. Jerline Pain in August. Pt originally had TOC with Aldona Bar but had to reschedule due to [facility] maternity leave. Please advise.   **Let patient know to contact pharmacy at the end of the day to make sure medication is ready. **  ** Please notify patient to allow 48-72 hours to process**  **Encourage patient to contact the pharmacy for refills or they can request refills through [facility]**

## 2020-05-23 NOTE — Telephone Encounter (Signed)
  NEXT APPOINTMENT DATE: 08/16/2020   LAST REFILL: 12/20/2019  Last done by Ledell Peoples, PA

## 2020-05-24 ENCOUNTER — Encounter: Payer: No Typology Code available for payment source | Admitting: Physician Assistant

## 2020-05-24 ENCOUNTER — Other Ambulatory Visit (HOSPITAL_COMMUNITY): Payer: Self-pay

## 2020-05-24 MED ORDER — FINASTERIDE 1 MG PO TABS
1.0000 mg | ORAL_TABLET | Freq: Every day | ORAL | 0 refills | Status: AC
  Filled 2020-05-24: qty 90, 90d supply, fill #0

## 2020-06-01 ENCOUNTER — Other Ambulatory Visit (HOSPITAL_COMMUNITY): Payer: Self-pay

## 2020-06-13 ENCOUNTER — Other Ambulatory Visit: Payer: Self-pay | Admitting: Family

## 2020-06-13 ENCOUNTER — Other Ambulatory Visit (HOSPITAL_COMMUNITY): Payer: Self-pay

## 2020-06-13 DIAGNOSIS — I10 Essential (primary) hypertension: Secondary | ICD-10-CM

## 2020-06-13 MED FILL — Levocetirizine Dihydrochloride Tab 5 MG: ORAL | 90 days supply | Qty: 90 | Fill #0 | Status: CN

## 2020-06-15 ENCOUNTER — Other Ambulatory Visit (HOSPITAL_COMMUNITY): Payer: Self-pay

## 2020-06-15 ENCOUNTER — Other Ambulatory Visit: Payer: Self-pay | Admitting: Family

## 2020-06-15 DIAGNOSIS — I10 Essential (primary) hypertension: Secondary | ICD-10-CM

## 2020-06-20 ENCOUNTER — Other Ambulatory Visit (HOSPITAL_COMMUNITY): Payer: Self-pay

## 2020-06-20 ENCOUNTER — Other Ambulatory Visit: Payer: Self-pay | Admitting: Family

## 2020-06-20 DIAGNOSIS — I10 Essential (primary) hypertension: Secondary | ICD-10-CM

## 2020-06-21 ENCOUNTER — Other Ambulatory Visit: Payer: Self-pay | Admitting: Family

## 2020-06-21 ENCOUNTER — Other Ambulatory Visit (HOSPITAL_COMMUNITY): Payer: Self-pay

## 2020-06-21 DIAGNOSIS — F5101 Primary insomnia: Secondary | ICD-10-CM

## 2020-06-26 ENCOUNTER — Telehealth: Payer: Self-pay

## 2020-06-26 NOTE — Addendum Note (Signed)
Addended by: Lia Foyer R on: 06/26/2020 09:34 AM   Modules accepted: Orders

## 2020-06-26 NOTE — Telephone Encounter (Signed)
ATC patient because it was brought to my attention that I forgot to order the home sleep study when we saw him in February. Order has now been put in and advised patient to call back so that we can get him rescheduled for about a month out.   When patient calls back please reschedule him to either July 5th or 8th there are some openings with Dr. Annamaria Boots. Or the week of July 18th there are openings

## 2020-06-27 ENCOUNTER — Telehealth: Payer: Self-pay

## 2020-06-27 ENCOUNTER — Ambulatory Visit: Payer: No Typology Code available for payment source | Admitting: Internal Medicine

## 2020-06-27 ENCOUNTER — Other Ambulatory Visit (HOSPITAL_COMMUNITY): Payer: Self-pay

## 2020-06-27 NOTE — Telephone Encounter (Signed)
Looks like he was a former [employer] pt. He can be scheduled any where within [place] if needed.  Algis Greenhouse. Jerline Pain, MD 06/27/2020 2:47 PM

## 2020-06-27 NOTE — Telephone Encounter (Signed)
Patient called in stating that he has been almost 3 weeks without medication because no one will fill any of them. He has a NP appt scheduled for 08/16/20  Wanting to know if Dr. Jerline Pain will refill the following medications:  Valium Lipitor Lunesta Fenofibrate Gabapentin Dyazide Lyrica Prilosec Robaxin Metoprolol Paxil  Propecia

## 2020-06-28 ENCOUNTER — Other Ambulatory Visit (HOSPITAL_COMMUNITY): Payer: Self-pay

## 2020-06-28 ENCOUNTER — Other Ambulatory Visit: Payer: Self-pay | Admitting: Family

## 2020-06-28 DIAGNOSIS — F5101 Primary insomnia: Secondary | ICD-10-CM

## 2020-06-28 DIAGNOSIS — I10 Essential (primary) hypertension: Secondary | ICD-10-CM

## 2020-06-28 NOTE — Telephone Encounter (Signed)
Please call pt and schedule appt for medication refills.

## 2020-06-28 NOTE — Telephone Encounter (Signed)
Left voice message to schedule appointment with any Provider at [facility]

## 2020-06-28 NOTE — Telephone Encounter (Signed)
LVM for patient to call back and schedule appt with any provider to get refills.

## 2020-06-30 ENCOUNTER — Other Ambulatory Visit: Payer: Self-pay | Admitting: Family

## 2020-06-30 ENCOUNTER — Other Ambulatory Visit (HOSPITAL_COMMUNITY): Payer: Self-pay

## 2020-06-30 DIAGNOSIS — F5101 Primary insomnia: Secondary | ICD-10-CM

## 2020-06-30 DIAGNOSIS — I10 Essential (primary) hypertension: Secondary | ICD-10-CM

## 2020-07-05 ENCOUNTER — Other Ambulatory Visit: Payer: Self-pay | Admitting: Family

## 2020-07-05 ENCOUNTER — Other Ambulatory Visit (HOSPITAL_COMMUNITY): Payer: Self-pay

## 2020-07-05 DIAGNOSIS — F5101 Primary insomnia: Secondary | ICD-10-CM

## 2020-07-05 DIAGNOSIS — I10 Essential (primary) hypertension: Secondary | ICD-10-CM

## 2020-07-27 ENCOUNTER — Emergency Department (HOSPITAL_BASED_OUTPATIENT_CLINIC_OR_DEPARTMENT_OTHER): Payer: Medicaid Other

## 2020-07-27 ENCOUNTER — Encounter (HOSPITAL_BASED_OUTPATIENT_CLINIC_OR_DEPARTMENT_OTHER): Payer: Self-pay

## 2020-07-27 ENCOUNTER — Emergency Department (HOSPITAL_BASED_OUTPATIENT_CLINIC_OR_DEPARTMENT_OTHER)
Admission: EM | Admit: 2020-07-27 | Discharge: 2020-07-28 | Disposition: A | Discharge status: Home / Self Care | Payer: Medicaid Other | Attending: Emergency Medicine | Admitting: Emergency Medicine

## 2020-07-27 ENCOUNTER — Other Ambulatory Visit: Payer: Self-pay

## 2020-07-27 DIAGNOSIS — I10 Essential (primary) hypertension: Secondary | ICD-10-CM | POA: Insufficient documentation

## 2020-07-27 DIAGNOSIS — Z7951 Long term (current) use of inhaled steroids: Secondary | ICD-10-CM | POA: Insufficient documentation

## 2020-07-27 DIAGNOSIS — J45909 Unspecified asthma, uncomplicated: Secondary | ICD-10-CM | POA: Insufficient documentation

## 2020-07-27 DIAGNOSIS — R000 Tachycardia, unspecified: Secondary | ICD-10-CM | POA: Insufficient documentation

## 2020-07-27 DIAGNOSIS — M542 Cervicalgia: Secondary | ICD-10-CM | POA: Insufficient documentation

## 2020-07-27 DIAGNOSIS — Z79899 Other long term (current) drug therapy: Secondary | ICD-10-CM | POA: Insufficient documentation

## 2020-07-27 DIAGNOSIS — U071 COVID-19: Secondary | ICD-10-CM | POA: Insufficient documentation

## 2020-07-27 MED ORDER — DEXAMETHASONE 6 MG PO TABS
6.0000 mg | ORAL_TABLET | Freq: Once | ORAL | Status: AC
  Administered 2020-07-27: 6 mg via ORAL
  Filled 2020-07-27: qty 1

## 2020-07-27 NOTE — ED Provider Notes (Signed)
[facility] Provider Note   CSN: [id number] Arrival date & time: 07/27/20  2132     History Chief Complaint  Patient presents with   Fever   Back Pain    Steve Jacobs is a 34 y.o. male.   Fever Associated symptoms: congestion, cough and sore throat   Associated symptoms: no chest pain, no confusion and no rash   Back Pain Associated symptoms: fever   Associated symptoms: no abdominal pain and no chest pain   Patient has known COVID infection.  Symptoms for around 3 days.  Unknown where he got it.  He states he has had 3 doses of Paxil bid.  States he has had throat tightness.  States that some difficulty swallowing.  States it feels to be improving some now.  States he has trouble sleeping because when he lays back his throat feels tighter.  States has been drooling.  Now able to handle secretions.  Pain in the throat.  Also myalgias cough with some green sputum.  Patient works for [employer] and feels that he can manage this at home.  States that he feels that someone needs to take a look at his throat doctor.    Past Medical History:  Diagnosis Date   Asthma    Chicken pox    Depression    Resolved   Environmental and seasonal allergies    Essential hypertension, benign 08/03/2013   Hyperlipidemia    Meniere's disease    Thoracic outlet syndrome     Patient Active Problem List   Diagnosis Date Noted   Snoring 02/28/2020   Radiculopathy, lumbosacral region 08/23/2019   Gastroesophageal reflux disease without esophagitis 06/19/2017   Need for hepatitis A vaccination 06/19/2017   Primary insomnia 02/02/2017   Routine screening for STI (sexually transmitted infection) 06/07/2016   SVT (supraventricular tachycardia) ([place]) 10/08/2014   Depression 10/08/2014   Atypical nevi 06/22/2014   Elevated triglycerides with high cholesterol 09/06/2013   Anxiety state 08/03/2013   Essential hypertension, benign 08/03/2013   Environmental allergies  03/23/2013   Visit for preventive health examination 03/23/2013   Meniere disease 03/23/2013   Intrinsic asthma     Past Surgical History:  Procedure Laterality Date   LUMBAR FUSION  04/22/2019   [facility] [place]- 2 LEVEL FUSION   Nevus Biopsy         Family History  Problem Relation Age of Onset   Hyperlipidemia Father        Living   Stroke Father    Hypertension Father    Alcohol abuse Mother        Living   Diabetes Mellitus II Maternal Grandfather    Hypertension Maternal Grandfather    Heart failure Maternal Grandfather    Kidney disease Maternal Grandfather    Heart disease Maternal Grandmother    Melanoma Paternal Grandmother    Heart attack Paternal Grandfather    Migraines Brother    Healthy Brother        x2   Drug abuse Sister        Died of Overdose    Social History   Tobacco Use   Smoking status: Never   Smokeless tobacco: Never  Vaping Use   Vaping Use: Never used  Substance Use Topics   Alcohol use: Yes    Alcohol/week: 0.0 standard drinks    Comment: 2-3 beers nightly   Drug use: No    Home Medications Prior to Admission medications  Medication Sig Start Date End Date Taking? Authorizing Provider  albuterol (VENTOLIN HFA) 108 (90 Base) MCG/ACT inhaler INHALE 2 PUFFS BY MOUTH EVERY 6 HOURS AS NEEDED FOR WHEEZING. 04/11/20 04/11/21  Dutch Quint B, FNP  atorvastatin (LIPITOR) 20 MG tablet TAKE 1 TABLET (20 MG TOTAL) BY MOUTH DAILY. 01/10/20 01/09/21  Brunetta Jeans, PA-C  diazepam (VALIUM) 2 MG tablet TAKE 1 TABLET BY MOUTH EVERY 12 HOURS AS NEEDED FOR ANXIETY. 04/11/20 10/08/20  Dutch Quint B, FNP  doxycycline (VIBRA-TABS) 100 MG tablet Take 1 tablet (100 mg total) by mouth 2 (two) times daily. 1 po bid 03/17/20   Hassell Done, Mary-Margaret, FNP  eszopiclone (LUNESTA) 2 MG TABS tablet TAKE 1 TABLET (2 MG TOTAL) BY MOUTH AT BEDTIME AS NEEDED FOR SLEEP. TAKE IMMEDIATELY BEFORE BEDTIME 03/14/20 09/10/20  Dutch Quint B, FNP  fenofibrate  (TRICOR) 145 MG tablet TAKE 1 TABLET (145 MG TOTAL) BY MOUTH DAILY. 02/18/20 02/17/21  Midge Minium, MD  finasteride (PROPECIA) 1 MG tablet Take 1 tablet (1 mg total) by mouth daily. 05/24/20   Vivi Barrack, MD  fluticasone [facility]) 50 MCG/ACT nasal spray Place 2 sprays into both nostrils daily. 06/18/18   Brunetta Jeans, PA-C  gabapentin (NEURONTIN) 100 MG capsule TAKE 1 CAPSULE (100 MG TOTAL) BY MOUTH 3 (THREE) TIMES A DAY. 01/13/20 01/12/21  Carmine Savoy, MD  HYDROcodone-acetaminophen (NORCO/VICODIN) 5-325 MG tablet Take 1 tablet by mouth 3 (three) times daily as needed. 10/08/19   [provider]  ibuprofen (ADVIL) 800 MG tablet TAKE 1 TABLET (800 MG TOTAL) BY MOUTH EVERY 8 (EIGHT) HOURS AS NEEDED FOR PAIN. 01/13/20 01/12/21  Carmine Savoy, MD  levocetirizine (XYZAL) 5 MG tablet TAKE 1 TABLET BY MOUTH EVERY EVENING. 02/25/20 02/24/21  Midge Minium, MD  methocarbamol (ROBAXIN) 750 MG tablet Take 750 mg by mouth 4 (four) times daily.    [provider]  methocarbamol (ROBAXIN) 750 MG tablet TAKE 1 TABLET BY MOUTH THREE TIMES A DAY WITH MEALS 03/17/20 03/17/21  Rescino, Mark H, PA-C  methocarbamol (ROBAXIN) 750 MG tablet TAKE 1 TABLET BY MOUTH THREE TIMES A DAY WITH MEALS 02/14/20 02/13/21  Meda Coffee, MD  methocarbamol (ROBAXIN) 750 MG tablet TAKE 1 TABLET BY MOUTH THREE TIMES A DAY WITH MEALS 01/05/20 01/04/21  Meda Coffee, MD  methocarbamol (ROBAXIN) 750 MG tablet TAKE 1 TABLET BY MOUTH THREE TIMES A DAY WITH MEALS 11/29/19 11/28/20  Meda Coffee, MD  methocarbamol (ROBAXIN) 750 MG tablet TAKE 1 TABLET BY MOUTH THREE TIMES A DAY WITH MEALS 10/25/19 10/24/20  Meda Coffee, MD  methocarbamol (ROBAXIN) 750 MG tablet TAKE 1 TABLET BY MOUTH TWICE DAILY WITH MEALS. 10/15/19 10/14/20  Meda Coffee, MD  methocarbamol (ROBAXIN) 750 MG tablet Take 1 tablet (750 mg total) by mouth Three (3) times a day. 05/10/20     metoprolol succinate (TOPROL-XL) 50  MG 24 hr tablet TAKE 1 TABLET (50 MG TOTAL) BY MOUTH DAILY. 04/11/20 04/11/21  Dutch Quint B, FNP  montelukast (SINGULAIR) 10 MG tablet TAKE 1 TABLET (10 MG TOTAL) BY MOUTH AT BEDTIME. 11/16/19 11/15/20  Brunetta Jeans, PA-C  Multiple Vitamin (MULTIVITAMIN WITH MINERALS) TABS tablet Take 1 tablet by mouth daily.    [provider]  naphazoline-pheniramine (NAPHCON-A) 0.025-0.3 % ophthalmic solution Place 1 drop into both eyes 4 (four) times daily as needed for eye irritation. 03/14/20   Couture, Cortni S, PA-C  omeprazole (PRILOSEC) 20 MG capsule TAKE 1 CAPSULE (20 MG TOTAL) BY MOUTH  DAILY. 04/11/20 04/11/21  Dutch Quint B, FNP  PARoxetine (PAXIL) 30 MG tablet Take 1 tablet (30 mg total) by mouth daily. 05/15/20   Inda Coke, PA  penicillin v potassium (VEETID) 500 MG tablet TAKE 1 TABLET (500 MG) BY MOUTH 4 TIMES DAILY 02/28/20 02/27/21    pregabalin (LYRICA) 50 MG capsule Take 1 capsule (50 mg total) by mouth 3 (three) times daily. 04/01/20   Quintella Reichert, MD  pregabalin (LYRICA) 50 MG capsule TAKE 1 CAPSULE BY MOUTH TWICE A DAY AS DIRECTED 03/27/20 09/23/20  Meda Coffee, MD  pregabalin (LYRICA) 50 MG capsule TAKE 1 CAPSULE BY MOUTH TWICE A DAY AS DIRECTED 03/17/20 09/13/20  Rescino, Mark H, PA-C  pregabalin (LYRICA) 50 MG capsule TAKE 1 CAPSULE BY MOUTH TWICE A DAY AS DIRECTED 01/05/20 07/03/20  Meda Coffee, MD  pregabalin (LYRICA) 50 MG capsule Take 1 capsule (50 mg total) by mouth Two (2) times a day. 05/10/20     pregabalin (LYRICA) 75 MG capsule Take 75 mg by mouth 2 (two) times daily. 10/15/19   [provider]  promethazine (PHENERGAN) 25 MG tablet TAKE 1 TABLET BY MOUTH EVERY 8 HOURS AS NEEDED FOR NAUSEA OR VOMITING. 01/04/20 01/03/21  Brunetta Jeans, PA-C  triamterene-hydrochlorothiazide (DYAZIDE) 37.5-25 MG capsule TAKE 1 CAPSULE BY MOUTH ONCE DAILY 03/14/20 03/14/21  Dutch Quint B, FNP  VASCEPA 1 g capsule TAKE 2 CAPSULES BY MOUTH TWICE A DAY 11/11/19   Brunetta Jeans, PA-C    Allergies    Augmentin [amoxicillin-pot clavulanate], Belsomra [suvorexant], and Percocet [oxycodone-acetaminophen]  Review of Systems   Review of Systems  Constitutional:  Positive for fatigue and fever.  HENT:  Positive for congestion, sore throat and trouble swallowing.   Respiratory:  Positive for cough.   Cardiovascular:  Negative for chest pain.  Gastrointestinal:  Negative for abdominal pain.  Genitourinary:  Negative for flank pain.  Musculoskeletal:  Positive for back pain.  Skin:  Negative for rash.  Psychiatric/Behavioral:  Negative for confusion.    Physical Exam Updated Vital Signs BP (!) 141/110 (BP Location: Right Arm)   Pulse (!) 110   Temp 99.2 F (37.3 C) (Oral)   Resp 19   Ht 5\' 10"  (1.778 m)   Wt 99.8 kg   SpO2 99%   BMI 31.57 kg/m   Physical Exam Vitals and nursing note reviewed.  HENT:     Head: Normocephalic.     Mouth/Throat:     Pharynx: No oropharyngeal exudate or posterior oropharyngeal erythema.  Eyes:     Pupils: Pupils are equal, round, and reactive to light.  Cardiovascular:     Rate and Rhythm: Tachycardia present.  Pulmonary:     Breath sounds: No wheezing, rhonchi or rales.  Abdominal:     Tenderness: There is no abdominal tenderness.  Musculoskeletal:        General: No tenderness.     Cervical back: Neck supple.  Lymphadenopathy:     Cervical: No cervical adenopathy.  Skin:    General: Skin is warm.  Neurological:     Mental Status: He is alert and oriented to person, place, and time.  Psychiatric:        Mood and Affect: Mood normal.    ED Results / Procedures / Treatments   Labs (all labs ordered are listed, but only abnormal results are displayed) Labs Reviewed - No data to display  EKG EKG Interpretation  Date/Time:  Thursday July 27 2020 21:52:24 EDT Ventricular Rate:  130 PR Interval:  130 QRS Duration: 90 QT Interval:  314 QTC Calculation: 462 R Axis:   -24 Text Interpretation: Sinus  tachycardia Possible Anterior infarct , age undetermined Abnormal ECG Confirmed by Davonna Belling [phone]) on 07/27/2020 11:34:04 PM  Radiology DG Chest Portable 1 View  Result Date: 07/27/2020 CLINICAL DATA:  Shortness of breath, COVID-19 positivity EXAM: PORTABLE CHEST 1 VIEW COMPARISON:  03/14/2017 FINDINGS: The heart size and mediastinal contours are within normal limits. Both lungs are clear. The visualized skeletal structures are unremarkable. IMPRESSION: No active disease. Electronically Signed   By: Inez Catalina M.D.   On: 07/27/2020 23:23    Procedures Procedures   Medications Ordered in ED Medications  dexamethasone (DECADRON) tablet 6 mg (6 mg Oral Given 07/27/20 2335)    ED Course  I have reviewed the triage vital signs and the nursing notes.  Pertinent labs & imaging results that were available during my care of the patient were reviewed by me and considered in my medical decision making (see chart for details).    MDM Rules/Calculators/A&P                          Patient with COVID-19 infection.  Had some neck tightness throat tightness.  States he had had trouble swallowing his secretions but now doing fine.  Posterior pharynx actually looks wide open from what I can see.  Feel less likely there is an epiglottitis or other deeper infection.  Will give single dose of steroids to see if that would help with any swelling there.  Not hypoxic but had to have some tachycardia.  Improved even since arrival.  States he feels he can manage this at home.  Do not think we need IV fluids or further work-up.  Chest x-ray reassuring.  Does not appear to have airway compromise either from obstruction or hypoxia.  Will discharge home. Final Clinical Impression(s) / ED Diagnoses Final diagnoses:  [id number]    Rx / DC Orders ED Discharge Orders     None        Davonna Belling, MD 07/27/20 2345

## 2020-07-27 NOTE — ED Triage Notes (Signed)
Patient here POV from Home with COVID-19.   Patient has been having Fevers, Back Pain, Joint Pain, Headache, Cough, SOB.  Patient began Paxlovid yesterday PM. Patient began having Throat Swelling recently.   NAD during Triage. Ambulatory, [facility] 15. Speaking in Full/Complete Sentences.

## 2020-08-16 ENCOUNTER — Encounter: Payer: Medicaid Other | Admitting: Family Medicine

## 2020-09-14 ENCOUNTER — Encounter: Payer: Self-pay | Admitting: Physician Assistant

## 2020-09-14 NOTE — Progress Notes (Incomplete)
Steve Jacobs is a 35 y.o. male here for a {New prob or follow up:31724}.  SCRIBE STATEMENT  History of Present Illness:   No chief complaint on file.   HPI  Past Medical History:  Diagnosis Date   Asthma    Chicken pox    Depression    Resolved   Environmental and seasonal allergies    Essential hypertension, benign 08/03/2013   Hyperlipidemia    Meniere's disease    Thoracic outlet syndrome      Social History   Tobacco Use   Smoking status: Never   Smokeless tobacco: Never  Vaping Use   Vaping Use: Never used  Substance Use Topics   Alcohol use: Yes    Alcohol/week: 0.0 standard drinks    Comment: 2-3 beers nightly   Drug use: No    Past Surgical History:  Procedure Laterality Date   LUMBAR FUSION  04/22/2019   [facility] in [place]- 2 LEVEL FUSION   Nevus Biopsy      Family History  Problem Relation Age of Onset   Hyperlipidemia Father        Living   Stroke Father    Hypertension Father    Alcohol abuse Mother        Living   Diabetes Mellitus II Maternal Grandfather    Hypertension Maternal Grandfather    Heart failure Maternal Grandfather    Kidney disease Maternal Grandfather    Heart disease Maternal Grandmother    Melanoma Paternal Grandmother    Heart attack Paternal Grandfather    Migraines Brother    Healthy Brother        x2   Drug abuse Sister        Died of Overdose    Allergies  Allergen Reactions   Augmentin [Amoxicillin-Pot Clavulanate]    Belsomra [Suvorexant] Other (See Comments)    nightmares   Percocet [Oxycodone-Acetaminophen] Nausea And Vomiting    Current Medications:   Current Outpatient Medications:    albuterol (VENTOLIN HFA) 108 (90 Base) MCG/ACT inhaler, INHALE 2 PUFFS BY MOUTH EVERY 6 HOURS AS NEEDED FOR WHEEZING., Disp: 54 g, Rfl: 1   atorvastatin (LIPITOR) 20 MG tablet, TAKE 1 TABLET (20 MG TOTAL) BY MOUTH DAILY., Disp: 90 tablet, Rfl: 0   diazepam (VALIUM) 2 MG tablet, TAKE 1 TABLET BY MOUTH EVERY 12  HOURS AS NEEDED FOR ANXIETY., Disp: 60 tablet, Rfl: 1   doxycycline (VIBRA-TABS) 100 MG tablet, Take 1 tablet (100 mg total) by mouth 2 (two) times daily. 1 po bid, Disp: 20 tablet, Rfl: 0   eszopiclone (LUNESTA) 2 MG TABS tablet, TAKE 1 TABLET (2 MG TOTAL) BY MOUTH AT BEDTIME AS NEEDED FOR SLEEP. TAKE IMMEDIATELY BEFORE BEDTIME, Disp: 30 tablet, Rfl: 1   fenofibrate (TRICOR) 145 MG tablet, TAKE 1 TABLET (145 MG TOTAL) BY MOUTH DAILY., Disp: 90 tablet, Rfl: 0   finasteride (PROPECIA) 1 MG tablet, Take 1 tablet (1 mg total) by mouth daily., Disp: 90 tablet, Rfl: 0   fluticasone (FLONASE) 50 MCG/ACT nasal spray, Place 2 sprays into both nostrils daily., Disp: 16 g, Rfl: 3   gabapentin (NEURONTIN) 100 MG capsule, TAKE 1 CAPSULE (100 MG TOTAL) BY MOUTH 3 (THREE) TIMES A DAY., Disp: 90 capsule, Rfl: 0   HYDROcodone-acetaminophen (NORCO/VICODIN) 5-325 MG tablet, Take 1 tablet by mouth 3 (three) times daily as needed., Disp: , Rfl:    ibuprofen (ADVIL) 800 MG tablet, TAKE 1 TABLET (800 MG TOTAL) BY MOUTH EVERY 8 (EIGHT)  HOURS AS NEEDED FOR PAIN., Disp: 60 tablet, Rfl: 2   levocetirizine (XYZAL) 5 MG tablet, TAKE 1 TABLET BY MOUTH EVERY EVENING., Disp: 90 tablet, Rfl: 1   methocarbamol (ROBAXIN) 750 MG tablet, Take 750 mg by mouth 4 (four) times daily., Disp: , Rfl:    methocarbamol (ROBAXIN) 750 MG tablet, TAKE 1 TABLET BY MOUTH THREE TIMES A DAY WITH MEALS, Disp: 90 tablet, Rfl: 0   methocarbamol (ROBAXIN) 750 MG tablet, TAKE 1 TABLET BY MOUTH THREE TIMES A DAY WITH MEALS, Disp: 90 tablet, Rfl: 0   methocarbamol (ROBAXIN) 750 MG tablet, TAKE 1 TABLET BY MOUTH THREE TIMES A DAY WITH MEALS, Disp: 90 tablet, Rfl: 0   methocarbamol (ROBAXIN) 750 MG tablet, TAKE 1 TABLET BY MOUTH THREE TIMES A DAY WITH MEALS, Disp: 90 tablet, Rfl: 0   methocarbamol (ROBAXIN) 750 MG tablet, TAKE 1 TABLET BY MOUTH THREE TIMES A DAY WITH MEALS, Disp: 90 tablet, Rfl: 0   methocarbamol (ROBAXIN) 750 MG tablet, TAKE 1 TABLET BY MOUTH  TWICE DAILY WITH MEALS., Disp: 40 tablet, Rfl: 0   methocarbamol (ROBAXIN) 750 MG tablet, Take 1 tablet (750 mg total) by mouth Three (3) times a day., Disp: 90 tablet, Rfl: 0   metoprolol succinate (TOPROL-XL) 50 MG 24 hr tablet, TAKE 1 TABLET (50 MG TOTAL) BY MOUTH DAILY., Disp: 90 tablet, Rfl: 0   montelukast (SINGULAIR) 10 MG tablet, TAKE 1 TABLET (10 MG TOTAL) BY MOUTH AT BEDTIME., Disp: 90 tablet, Rfl: 1   Multiple Vitamin (MULTIVITAMIN WITH MINERALS) TABS tablet, Take 1 tablet by mouth daily., Disp: , Rfl:    naphazoline-pheniramine (NAPHCON-A) 0.025-0.3 % ophthalmic solution, Place 1 drop into both eyes 4 (four) times daily as needed for eye irritation., Disp: 15 mL, Rfl: 0   omeprazole (PRILOSEC) 20 MG capsule, TAKE 1 CAPSULE (20 MG TOTAL) BY MOUTH DAILY., Disp: 90 capsule, Rfl: 1   PARoxetine (PAXIL) 30 MG tablet, Take 1 tablet (30 mg total) by mouth daily., Disp: 30 tablet, Rfl: 0   penicillin v potassium (VEETID) 500 MG tablet, TAKE 1 TABLET (500 MG) BY MOUTH 4 TIMES DAILY, Disp: 28 tablet, Rfl: 0   pregabalin (LYRICA) 50 MG capsule, Take 1 capsule (50 mg total) by mouth 3 (three) times daily., Disp: 8 capsule, Rfl: 0   pregabalin (LYRICA) 50 MG capsule, TAKE 1 CAPSULE BY MOUTH TWICE A DAY AS DIRECTED, Disp: 60 capsule, Rfl: 0   pregabalin (LYRICA) 50 MG capsule, TAKE 1 CAPSULE BY MOUTH TWICE A DAY AS DIRECTED, Disp: 30 capsule, Rfl: 0   pregabalin (LYRICA) 50 MG capsule, TAKE 1 CAPSULE BY MOUTH TWICE A DAY AS DIRECTED, Disp: 30 capsule, Rfl: 0   pregabalin (LYRICA) 50 MG capsule, Take 1 capsule (50 mg total) by mouth Two (2) times a day., Disp: 60 capsule, Rfl: 0   pregabalin (LYRICA) 75 MG capsule, Take 75 mg by mouth 2 (two) times daily., Disp: , Rfl:    promethazine (PHENERGAN) 25 MG tablet, TAKE 1 TABLET BY MOUTH EVERY 8 HOURS AS NEEDED FOR NAUSEA OR VOMITING., Disp: 20 tablet, Rfl: 0   triamterene-hydrochlorothiazide (DYAZIDE) 37.5-25 MG capsule, TAKE 1 CAPSULE BY MOUTH ONCE DAILY,  Disp: 90 capsule, Rfl: 0   VASCEPA 1 g capsule, TAKE 2 CAPSULES BY MOUTH TWICE A DAY, Disp: 360 capsule, Rfl: 1   Review of Systems:   ROS  Vitals:   There were no vitals filed for this visit.   There is no height or weight on file  to calculate BMI.  Physical Exam:   Physical Exam  Results for orders placed or performed during the hospital encounter of 04/01/20  Comprehensive metabolic panel  Result Value Ref Range   Sodium 141 135 - 145 mmol/L   Potassium 3.4 (L) 3.5 - 5.1 mmol/L   Chloride 105 98 - 111 mmol/L   CO2 25 22 - 32 mmol/L   Glucose, Bld 111 (H) 70 - 99 mg/dL   BUN 14 6 - 20 mg/dL   Creatinine, Ser 0.91 0.61 - 1.24 mg/dL   Calcium 9.4 8.9 - 10.3 mg/dL   Total Protein 6.9 6.5 - 8.1 g/dL   Albumin 4.6 3.5 - 5.0 g/dL   AST 16 15 - 41 U/L   ALT 22 0 - 44 U/L   Alkaline Phosphatase 48 38 - 126 U/L   Total Bilirubin 0.4 0.3 - 1.2 mg/dL   GFR, Estimated >60 >60 mL/min   Anion gap 11 5 - 15  CBC with Differential  Result Value Ref Range   WBC 5.2 4.0 - 10.5 K/uL   RBC 5.00 4.22 - 5.81 MIL/uL   Hemoglobin 14.1 13.0 - 17.0 g/dL   HCT 41.6 39.0 - 52.0 %   MCV 83.2 80.0 - 100.0 fL   MCH 28.2 26.0 - 34.0 pg   MCHC 33.9 30.0 - 36.0 g/dL   RDW 13.9 11.5 - 15.5 %   Platelets 212 150 - 400 K/uL   nRBC 0.0 0.0 - 0.2 %   Neutrophils Relative % 60 %   Neutro Abs 3.1 1.7 - 7.7 K/uL   Lymphocytes Relative 27 %   Lymphs Abs 1.4 0.7 - 4.0 K/uL   Monocytes Relative 9 %   Monocytes Absolute 0.5 0.1 - 1.0 K/uL   Eosinophils Relative 2 %   Eosinophils Absolute 0.1 0.0 - 0.5 K/uL   Basophils Relative 2 %   Basophils Absolute 0.1 0.0 - 0.1 K/uL   Immature Granulocytes 0 %   Abs Immature Granulocytes 0.02 0.00 - 0.07 K/uL  Lipase, blood  Result Value Ref Range   Lipase 29 11 - 51 U/L  Urinalysis, Routine w reflex microscopic Urine, Clean Catch  Result Value Ref Range   Color, Urine YELLOW YELLOW   APPearance CLEAR CLEAR   Specific Gravity, Urine 1.045 (H) 1.005 - 1.030    pH 6.0 5.0 - 8.0   Glucose, UA NEGATIVE NEGATIVE mg/dL   Hgb urine dipstick NEGATIVE NEGATIVE   Bilirubin Urine NEGATIVE NEGATIVE   Ketones, ur NEGATIVE NEGATIVE mg/dL   Protein, ur TRACE (A) NEGATIVE mg/dL   Nitrite NEGATIVE NEGATIVE   Leukocytes,Ua NEGATIVE NEGATIVE    Assessment and Plan:   '@DIAGLIST'$ @    I,Savera Zaman,acting as a scribe for [employer], PA.,have documented all relevant documentation on the behalf of Inda Coke, PA,as directed by  Inda Coke, PA while in the presence of Inda Coke, [place].   ***  Inda Coke, PA-C

## 2022-07-28 IMAGING — MR MR LUMBAR SPINE WO/W CM
4 of 8 series · 24 of 48 positions shown · IV contrast (gadavist)
Comparison: 01/18/2019

CLINICAL DATA: Recurrent pain down both legs

EXAM:
MRI LUMBAR SPINE WITHOUT AND WITH CONTRAST
TECHNIQUE: Multiplanar and multiecho pulse sequences of the lumbar spine were
obtained without and with intravenous contrast.
CONTRAST:  9mL GADAVIST GADOBUTROL 1 MMOL/ML IV SOLN

[Series 5: T2 · sagittal · 4.0mm · 0.73mm/px · 5 of 17 slices shown (1 of 2)]
[im 1/17]
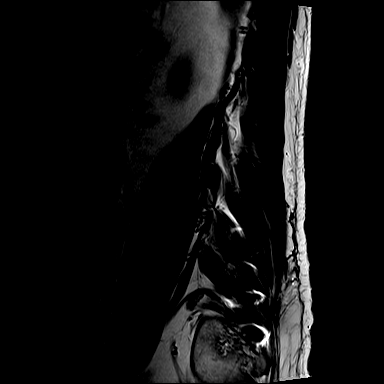
[im 5/17]
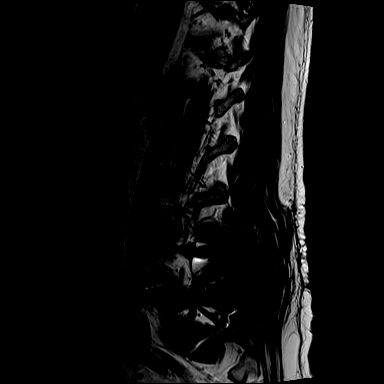
[im 9/17]
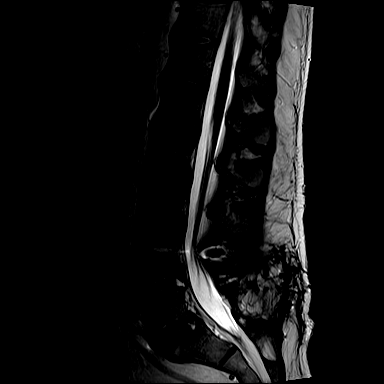
[im 13/17]
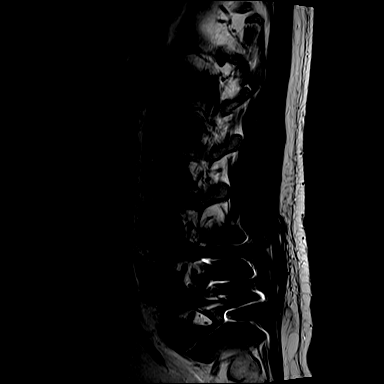
[im 17/17]
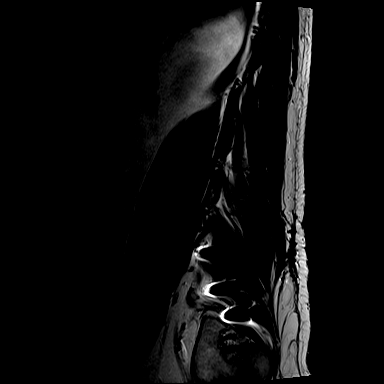

[Series 7: T1 · sagittal · 4.0mm · 0.88mm/px · 4 of 17 slices shown (1 of 2)]
[im 1/17]
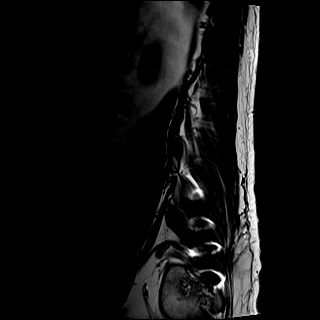
[im 6/17]
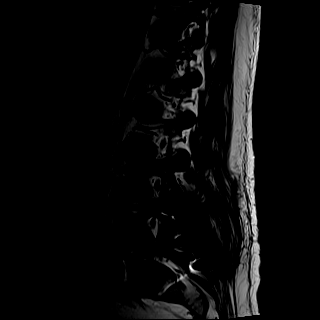
[im 11/17]
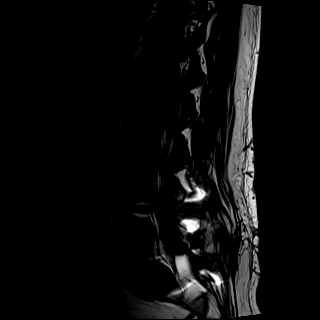
[im 17/17]
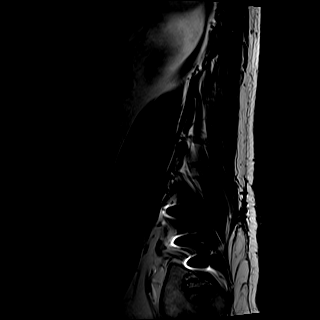

[Series 8: T2 · axial · 4.0mm · 0.57mm/px · z∈[-88,+137]mm · 9 of 41 slices shown (2 of 2)]
[im 1/41]
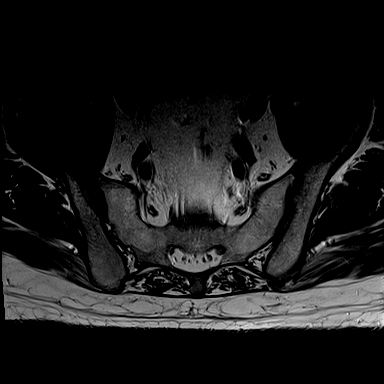
[im 6/41]
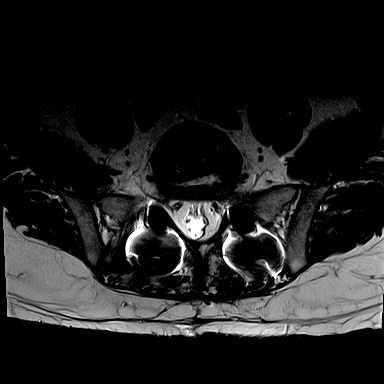
[im 11/41]
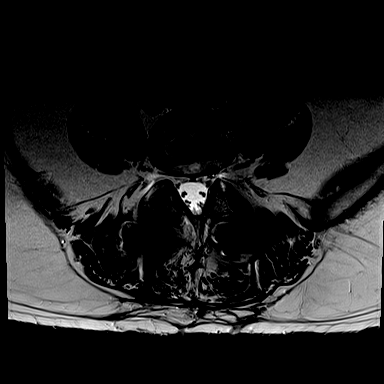
[im 16/41]
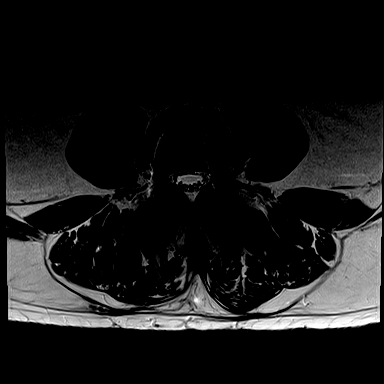
[im 21/41]
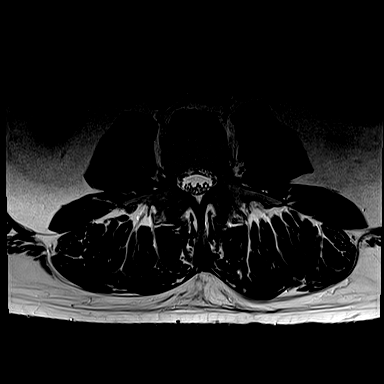
[im 26/41]
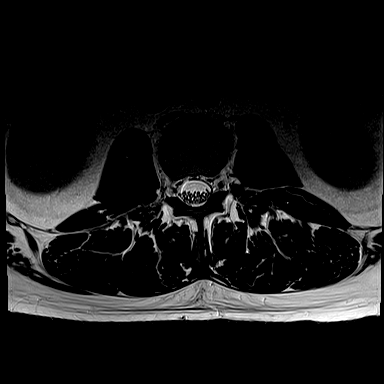
[im 31/41]
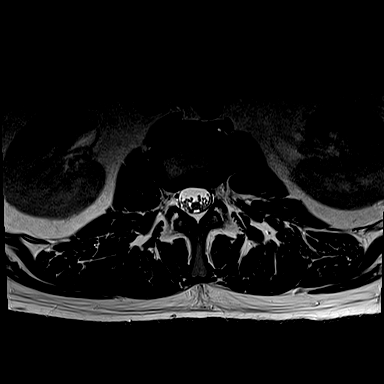
[im 36/41]
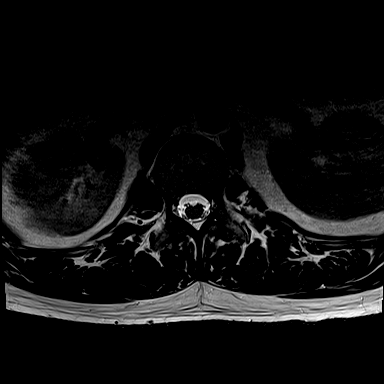
[im 41/41]
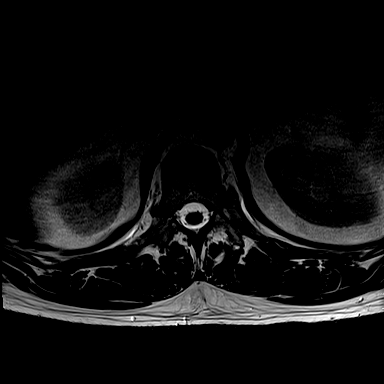

[Series 9: T1 · axial · 4.0mm · 0.34mm/px · z∈[-88,+112]mm · 6 of 41 slices shown (2 of 2)]
[im 1/41]
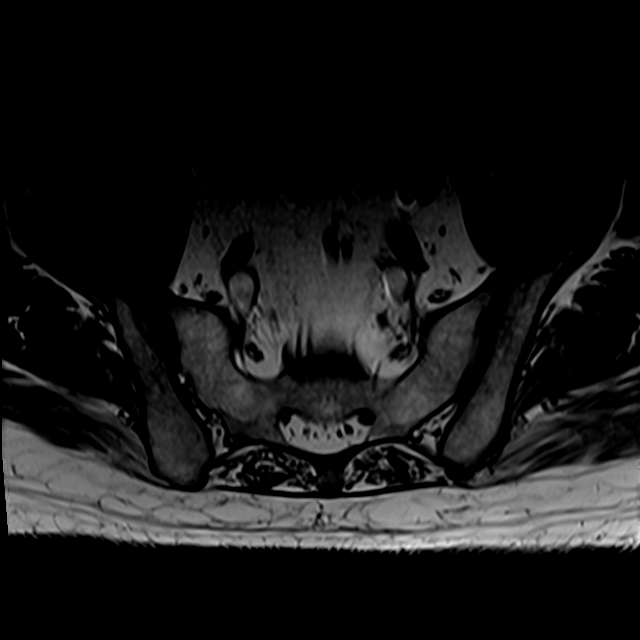
[im 6/41]
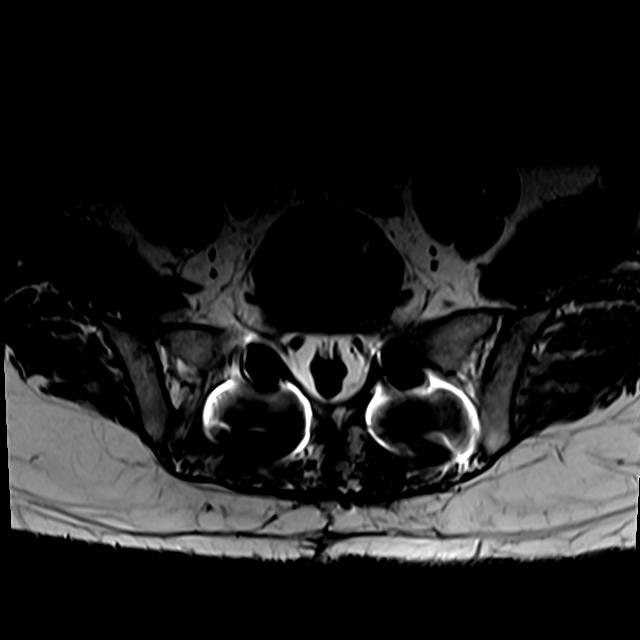
[im 11/41]
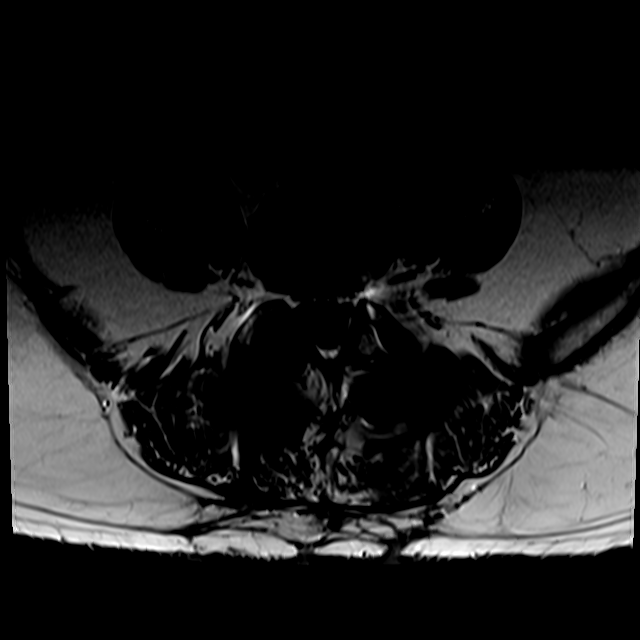
[im 16/41]
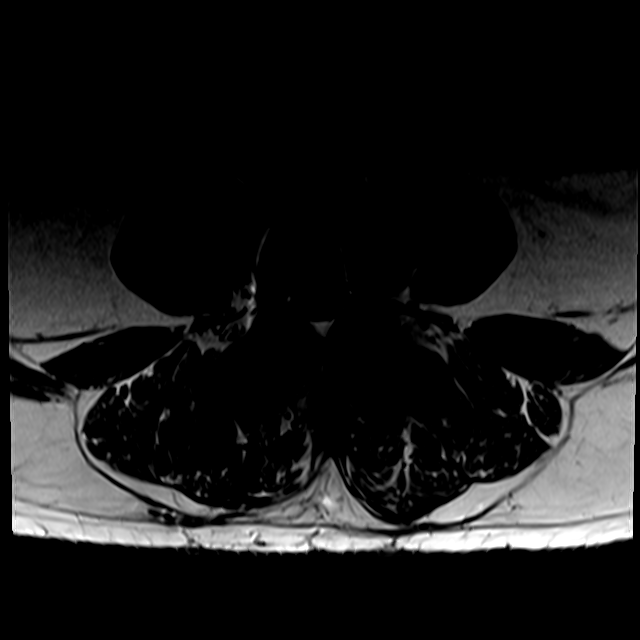
[im 21/41]
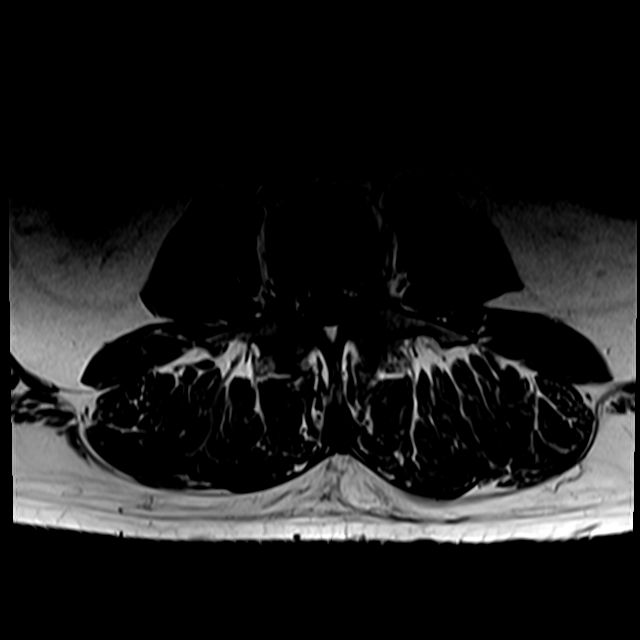
[im 36/41]
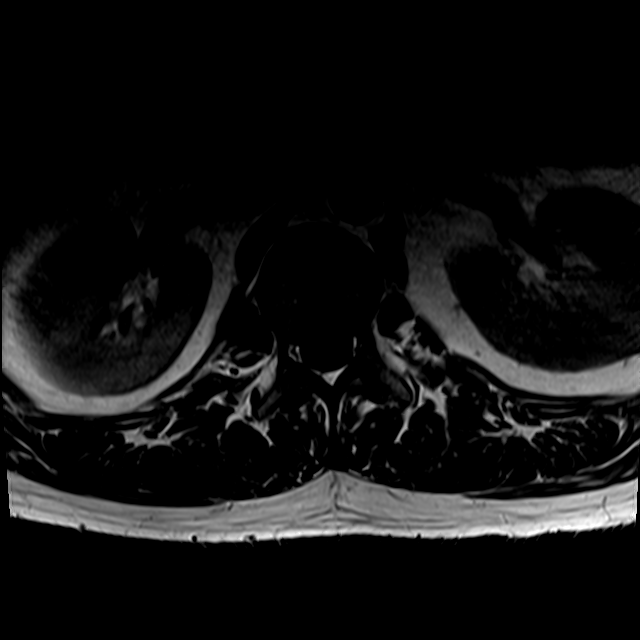

[24 of 48 positions shown; findings below may reference images not displayed]

FINDINGS: Segmentation: The lowest lumbar type non-rib-bearing vertebra is
labeled as L5.

Alignment: 3 mm degenerative retrolisthesis at L4-5 and 4 mm
degenerative anterolisthesis at L5-S1, similar to the 01/18/2019
exam.

Vertebrae: Interval interbody fusion at L4-5 and L5-S1 with
posterolateral rod and pedicle screw fixation at L4-L5-S1
bilaterally. No significant abnormal osseous edema or enhancement at
the postoperative levels or other levels.

Conus medullaris and cauda equina: Conus extends to the upper L2
level. Conus and cauda equina appear normal. No findings of nerve
root enhancement or nerve root clumping to suggest arachnoiditis.

Paraspinal and other soft tissues: Expected postoperative findings
from posterior decompression at L4-5 and L5-S1. No significant
abnormal postoperative fluid collection in this vicinity.

Disc levels:

L1-2: Unremarkable.

L2-3: No impingement.  Mild disc bulge.

L3-4: No impingement.  Mild disc bulge.

L4-5: No impingement. No postoperative complicating features.
Minimal right eccentric intervertebral spurring along the inferior
endplate of L4.

L5-S1: No impingement.  No postoperative complicating features.
IMPRESSION: 1. Interval interbody fusion at L4-5 and L5-S1, without
postoperative complicating features.
2. Mild disc bulges at L2-3 and L3-4, without impingement.
